# Patient Record
Sex: Male | Born: 1960
Health system: Southern US, Community
[De-identification: ages and names within clinical notes are randomized; demographics above are authoritative.]

## PROBLEM LIST (undated history)

## (undated) DIAGNOSIS — F32A Depression, unspecified: Secondary | ICD-10-CM

## (undated) DIAGNOSIS — F0390 Unspecified dementia without behavioral disturbance: Secondary | ICD-10-CM

## (undated) DIAGNOSIS — M1990 Unspecified osteoarthritis, unspecified site: Secondary | ICD-10-CM

## (undated) HISTORY — PX: ANKLE FRACTURE SURGERY: SHX122

## (undated) HISTORY — PX: APPENDECTOMY: SHX54

## (undated) HISTORY — DX: Unspecified osteoarthritis, unspecified site: M19.90

---
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
## 2007-10-13 ENCOUNTER — Emergency Department (HOSPITAL_COMMUNITY): Admission: EM | Admit: 2007-10-13 | Discharge: 2007-10-13 | Payer: Self-pay | Admitting: Emergency Medicine

## 2007-10-29 ENCOUNTER — Ambulatory Visit (HOSPITAL_BASED_OUTPATIENT_CLINIC_OR_DEPARTMENT_OTHER): Admission: RE | Admit: 2007-10-29 | Discharge: 2007-10-29 | Payer: Self-pay | Admitting: Orthopedic Surgery

## 2007-11-18 ENCOUNTER — Encounter: Admission: RE | Admit: 2007-11-18 | Discharge: 2008-02-18 | Payer: Self-pay | Admitting: Orthopedic Surgery

## 2011-01-03 NOTE — Op Note (Signed)
NAME:  Daniel Griffin, Daniel Griffin              ACCOUNT NO.:  [id number]   MEDICAL RECORD NO.:  [id number]          PATIENT TYPE:  AMB   LOCATION:  DSC                          FACILITY:  [facility]   PHYSICIAN:  Leonides Grills, M.D.     DATE OF BIRTH:  03-Feb-1961   DATE OF PROCEDURE:  10/29/2007  DATE OF DISCHARGE:                               OPERATIVE REPORT   PREOPERATIVE DIAGNOSES:  1. Left distal tib-fib syndesmotic rupture.  2. Left proximal fibular fracture.   POSTOPERATIVE DIAGNOSES:  1. Left distal tib-fib syndesmotic rupture.  2. Left proximal fibular fracture.   OPERATION:  1. Open reduction internal fixation left distal tib-fib syndesmotic      rupture.  2. Stress x-rays, left ankle.  3. Closed reduction with manipulation, left fibular shaft fracture.   ANESTHESIA:  General with popliteal block.   SURGEON:  Leonides Grills, M.D.   ASSISTANT:  Sullivan Lone Clark,P.A.-C.   ESTIMATED BLOOD LOSS:  Minimal.   TOURNIQUET TIME:  Approximately half hour.   COMPLICATIONS:  None.   DISPOSITION:  Stable to PR.   INDICATIONS:  A 50 year old male who sustained the above injury.  He  consented to the above procedure.  All risks which include infection,  nerve or vessel injury, nonunion, malunion, hardware irritation,  hardware failure, the possibility the ligament may not completely heel,  prolonged recovery, stiffness, arthritis were all explained.  Questions  were encouraged and answered.   OPERATION:  The patient was brought to the operating, placed in supine  position.  After adequate general endotracheal tube anesthesia was  administered with popliteal block as well as Ancef 1 gram IV piggyback,  a bump was then placed under the left ipsilateral hip internally  rotating left lower extremity, and left lower extremity was then prepped  and draped in a sterile manner over a proximally placed thigh  tourniquet.  Limb was gravity exsanguinated.  Tourniquet was elevated to  290 mmHg.  A  longitudinal incision that was designated by C-arm  assistance was then made over the lateral malleolus over the area where  the syndesmotic fixation with would be placed.  Dissection was carried  down directly to bone.  Soft tissue was elevated over the lateral  malleolus area approximately a centimeter and a half proximal to the  tibial plafond.  A two-hole one-third tubular plate was then applied.  A  3.2-mm hole was then placed.  We then made a nick and spread technique  incision over the medial malleolus.  Dissection was carried down to  bone.  A small drill hole was placed using a 3.2-mm drill hole.  We then  placed a Brooke Dare Tongs reduction clamp, reduced the distal tib-fib  syndesmosis until it was anatomically reduced, and this was verified  under C-arm guidance in the AP and lateral planes.  This was done with  the ankle in full dorsiflexion.  Once this was done, we then placed a  4.5-mm fully threaded cortical set screw using a 3.2-mm drill hole,  respectively.  This was a four cortices screw, and this had excellent  purchase and maintenance of the  reduction.  The King Tongs clamp was  then removed, and a parallel four cortices to 4.5-mm fully threaded  cortical set screw was placed using a 3.2-mm drill hole, respectively.  This had excellent purchase and maintenance of the initial reduction.  Final stress x-rays were obtained in the AP, lateral, and mortise views  and showed no gross motion, fixation proposition, excellent alignment as  well.  Medial clear space was reduced, and adequate tib-fib overlap was  attained as well as fibular length as well in rotation, and the AP  position of the lateral malleolus was also reproduced as well.  Tourniquet was deflated.  Hemostasis was obtained.  The fibular shaft  was reduced via the reduction maneuver that was done earlier, and this  was also verified under C-arm guidance as well.  The wound was copiously  irrigated with normal saline.   Subcutaneous was closed with 3-0 Vicryl.  Skin was closed 4-0 nylon over all wounds.  Sterile dressing was  applied.  Modified Jones dressing was applied with the ankle in neutral  dorsiflexion.  The patient was stable to PR.      Leonides Grills, M.D.  Electronically Signed     PB/MEDQ  D:  10/29/2007  T:  10/30/2007  Job:  [id number]

## 2011-05-15 LAB — POCT HEMOGLOBIN-HEMACUE: Hemoglobin: 14.5

## 2016-07-07 ENCOUNTER — Encounter: Payer: Self-pay | Admitting: Rheumatology

## 2016-07-07 ENCOUNTER — Ambulatory Visit: Payer: Self-pay | Admitting: Rheumatology

## 2016-07-07 ENCOUNTER — Ambulatory Visit (INDEPENDENT_AMBULATORY_CARE_PROVIDER_SITE_OTHER): Payer: BLUE CROSS/BLUE SHIELD | Admitting: Rheumatology

## 2016-07-07 VITALS — BP 166/88 | HR 83 | Resp 14 | Ht 66.0 in | Wt 216.0 lb

## 2016-07-07 DIAGNOSIS — Z79899 Other long term (current) drug therapy: Secondary | ICD-10-CM | POA: Diagnosis not present

## 2016-07-07 DIAGNOSIS — M79642 Pain in left hand: Secondary | ICD-10-CM | POA: Diagnosis not present

## 2016-07-07 DIAGNOSIS — M79671 Pain in right foot: Secondary | ICD-10-CM

## 2016-07-07 DIAGNOSIS — M0579 Rheumatoid arthritis with rheumatoid factor of multiple sites without organ or systems involvement: Secondary | ICD-10-CM | POA: Diagnosis not present

## 2016-07-07 DIAGNOSIS — M79672 Pain in left foot: Secondary | ICD-10-CM | POA: Diagnosis not present

## 2016-07-07 DIAGNOSIS — M79641 Pain in right hand: Secondary | ICD-10-CM

## 2016-07-07 DIAGNOSIS — M25571 Pain in right ankle and joints of right foot: Secondary | ICD-10-CM

## 2016-07-07 DIAGNOSIS — M25572 Pain in left ankle and joints of left foot: Secondary | ICD-10-CM

## 2016-07-07 LAB — CBC WITH DIFFERENTIAL/PLATELET
Basophils Absolute: 51 cells/uL (ref 0–200)
Basophils Relative: 1 %
Eosinophils Absolute: 204 cells/uL (ref 15–500)
Eosinophils Relative: 4 %
HCT: 41.8 % (ref 38.5–50.0)
Hemoglobin: 14.1 g/dL (ref 13.2–17.1)
Lymphocytes Relative: 25 %
Lymphs Abs: 1275 cells/uL (ref 850–3900)
MCH: 33.3 pg — ABNORMAL HIGH (ref 27.0–33.0)
MCHC: 33.7 g/dL (ref 32.0–36.0)
MCV: 98.8 fL (ref 80.0–100.0)
MPV: 9.3 fL (ref 7.5–12.5)
Monocytes Absolute: 663 cells/uL (ref 200–950)
Monocytes Relative: 13 %
Neutro Abs: 2907 cells/uL (ref 1500–7800)
Neutrophils Relative %: 57 %
Platelets: 211 10*3/uL (ref 140–400)
RBC: 4.23 MIL/uL (ref 4.20–5.80)
RDW: 12.8 % (ref 11.0–15.0)
WBC: 5.1 10*3/uL (ref 3.8–10.8)

## 2016-07-07 LAB — COMPREHENSIVE METABOLIC PANEL
ALT: 30 U/L (ref 9–46)
AST: 22 U/L (ref 10–35)
Albumin: 4.2 g/dL (ref 3.6–5.1)
Alkaline Phosphatase: 61 U/L (ref 40–115)
BUN: 12 mg/dL (ref 7–25)
CO2: 23 mmol/L (ref 20–31)
Calcium: 9.8 mg/dL (ref 8.6–10.3)
Chloride: 106 mmol/L (ref 98–110)
Creat: 0.7 mg/dL (ref 0.70–1.33)
Glucose, Bld: 124 mg/dL — ABNORMAL HIGH (ref 65–99)
Potassium: 4.4 mmol/L (ref 3.5–5.3)
Sodium: 140 mmol/L (ref 135–146)
Total Bilirubin: 0.8 mg/dL (ref 0.2–1.2)
Total Protein: 7.3 g/dL (ref 6.1–8.1)

## 2016-07-07 MED ORDER — ETANERCEPT 50 MG/ML ~~LOC~~ SOAJ: 50.0000 mg | SUBCUTANEOUS | 0 refills | Status: DC

## 2016-07-07 MED ORDER — ENBREL SURECLICK 50 MG/ML ~~LOC~~ SOAJ: 50.0000 mg | SUBCUTANEOUS | 0 refills | Status: DC

## 2016-07-07 NOTE — Progress Notes (Addendum)
Office Visit Note  Patient: Daniel Griffin             Date of Birth: [birth year]           MRN: [id number]             PCP: No primary care provider on file. Referring: No ref. provider found Visit Date: 07/07/2016 Occupation: @GUAROCC @    Subjective:  No chief complaint on file. Follow-up on rheumatoid arthritis, high risk prescription  History of Present Illness: Daniel Griffin is a 55 y.o. male   Last seen 12/24/2015. Patient is doing well with the rheumatoid arthritis with Enbrel on board. He takes his Enbrel every week as prescribed. Patient notes that he is having better range of motion of his hands/wrists even his right elbow which had contracture in the past. He is pleased with the response that he is getting from Enbrel.  He reports that when he does extra activity for work he does have increased joint pain.  He has a history of OA of the hands which causes him occasional pain. Especially when he overdoes it at work.  Activities of Daily Living:  Patient reports morning stiffness for 15 minutes.   Patient Reports nocturnal pain.  Difficulty dressing/grooming: Reports Difficulty climbing stairs: Reports Difficulty getting out of chair: Reports Difficulty using hands for taps, buttons, cutlery, and/or writing: Reports   Review of Systems  Constitutional: Negative for fatigue.  HENT: Negative for mouth sores and mouth dryness.   Eyes: Negative for dryness.  Respiratory: Negative for shortness of breath.   Gastrointestinal: Negative for constipation and diarrhea.  Musculoskeletal: Negative for myalgias and myalgias.  Skin: Negative for sensitivity to sunlight.  Neurological: Negative for memory loss.  Psychiatric/Behavioral: Negative for sleep disturbance.    PMFS History:  There are no active problems to display for this patient.   Past Medical History:  Diagnosis Date  . Arthritis     Family History  Problem Relation Age of Onset  . Alzheimer's  disease Father    History reviewed. No pertinent surgical history. Social History   Social History Narrative  . No narrative on file     Objective: Vital Signs: BP (!) 166/88 (BP Location: Left Arm, Patient Position: Sitting, Cuff Size: Large)   Pulse 83   Resp 14   Ht 5\' 6"  (1.676 m)   Wt 216 lb (98 kg)   BMI 34.86 kg/m    Physical Exam  Constitutional: He is oriented to person, place, and time. He appears well-developed and well-nourished.  HENT:  Head: Normocephalic and atraumatic.  Eyes: Conjunctivae and EOM are normal. Pupils are equal, round, and reactive to light.  Neck: Normal range of motion. Neck supple.  Cardiovascular: Normal rate, regular rhythm and normal heart sounds.  Exam reveals no gallop and no friction rub.   No murmur heard. Pulmonary/Chest: Effort normal and breath sounds normal. No respiratory distress. He has no wheezes. He has no rales. He exhibits no tenderness.  Abdominal: Soft. He exhibits no distension and no mass. There is no tenderness. There is no guarding.  Musculoskeletal: Normal range of motion. He exhibits deformity (RIGHT ELBOW W/ HX OF CONTRACTURE; IMPROVING AFTER STARTING ENBREL).  Lymphadenopathy:    He has no cervical adenopathy.  Neurological: He is alert and oriented to person, place, and time. He exhibits normal muscle tone. Coordination normal.  Skin: Skin is warm and dry. Capillary refill takes less than 2 seconds. No rash noted.  Psychiatric: He  has a normal mood and affect. His behavior is normal. Judgment and thought content normal.  Vitals reviewed.    Musculoskeletal Exam:  Full range of motion of all joints, patient's right elbow has less contracture than before. Patient is doing really well with that.  Grip strength is equal and strong bilaterally  Fiber myalgia tender points are all absent  CDAI Exam: CDAI Homunculus Exam:   Swelling:  RUE: wrist LUE: wrist Right hand: 2nd MCP and 3rd MCP Left hand: 2nd MCP and  3rd MCP  Joint Counts:  CDAI Tender Joint count: 0 CDAI Swollen Joint count: 6  Global Assessments:  Patient Global Assessment: 4 Provider Global Assessment: 4  CDAI Calculated Score: 14  There was no synovitis on exam but there was synovial thickening of bilateral wrists and bilateral second and third MCP joint.   Investigation: No additional findings.   Imaging: No results found.  Speciality Comments: No specialty comments available.    Procedures:  No procedures performed Allergies: Patient has no allergy information on record.   Assessment / Plan:     Visit Diagnoses: Rheumatoid arthritis with rheumatoid factor of multiple sites without organ or systems involvement ([place]) - +RF; +CCP;   High risk medications (not anticoagulants) long-term use - +ENBREL q WEEK (ADEQUATE RESPONSE) - Plan: Comprehensive metabolic panel, CBC with Differential/Platelet, Comprehensive metabolic panel, CBC with Differential/Platelet, Comprehensive metabolic panel, CBC with Differential/Platelet, Quantiferon tb gold assay (blood), Quantiferon tb gold assay (blood)  Pain in both hands  Pain in joints of both feet   On examination today patient has second and third left and right joints MCP with synovial thickening. No tenderness no synovitis.  Patient is doing well with Enbrel which he takes every week.  Give him a sample of Enbrel today.  He is out of the medication and we will give him a refill on Enbrel. He takes it every week as prescribed.  He has lab work done 03/27/2016 and CMP with GFR is normal and CBC with differential is normal except for red blood cells slightly low at 4.07 which is close to normal limits and MCH slightly high at 33.9.  Patient's blood pressure slightly elevated. He was asked to follow with his PCP regarding this.  I ordered TB goal today but accidentally entered it twice. He is only getting TB gold one time today.   Today, we will do CBC with differential  CMP with GFR and then every 3 months. Refill Enbrel 3 months and then one sample to go  Orders: Orders Placed This Encounter  Procedures  . Comprehensive metabolic panel  . CBC with Differential/Platelet  . Quantiferon tb gold assay (blood)  . Quantiferon tb gold assay (blood)   Meds ordered this encounter  Medications  . ENBREL SURECLICK 50 MG/ML injection    Sig: Inject 0.98 mLs (50 mg total) into the skin once a week.    Dispense:  11.76 mL    Refill:  0    Order Specific Question:   Supervising Provider    Answer:   Bo Merino [[id number]]  . etanercept (ENBREL SURECLICK) 50 MG/ML injection    Sig: Inject 0.98 mLs (50 mg total) into the skin once a week.    Dispense:  0.98 mL    Refill:  0    Order Specific Question:   Supervising Provider    Answer:   Bo Merino [phone]    Order Specific Question:   Lot Number?    Answer:  [id number]    Order Specific Question:   Expiration Date?    Answer:   07/21/2018    Order Specific Question:   Quantity    Answer:   1    Face-to-face time spent with patient was 30 minutes. 50% of time was spent in counseling and coordination of care.  Follow-Up Instructions: Return in about 5 months (around 12/05/2016) for RA, HRRX, bilateral hand and feet pain c/w OA.   Eliezer Lofts, PA-C   I examined and evaluated the patient with Eliezer Lofts PA. The plan of care was discussed as noted above.  Bo Merino MD

## 2016-07-10 LAB — QUANTIFERON TB GOLD ASSAY (BLOOD)
Interferon Gamma Release Assay: NEGATIVE
Mitogen-Nil: 6.68 IU/mL
Quantiferon Nil Value: 0.13 IU/mL
Quantiferon Tb Ag Minus Nil Value: 0 IU/mL

## 2016-07-10 NOTE — Progress Notes (Signed)
#  1 (TB gold is negative number#2 (CBC with differential and CMP with GFR within normal limits.No change in treatmentGive lab results to patient and  send copy to his PCP  thank you

## 2016-07-11 ENCOUNTER — Telehealth: Payer: Self-pay | Admitting: Radiology

## 2016-07-11 NOTE — Telephone Encounter (Signed)
-----   Message from [place], [place] sent at 07/10/2016  6:06 PM EST ----- #1 (TB gold is negative number#2 (CBC with differential and CMP with GFR within normal limits.No change in treatmentGive lab results to patient and  send copy to his PCP  thank you

## 2016-07-11 NOTE — Telephone Encounter (Signed)
I have called patient to advise labs are normal  

## 2016-07-24 ENCOUNTER — Other Ambulatory Visit: Payer: Self-pay | Admitting: Rheumatology

## 2016-07-24 NOTE — Telephone Encounter (Signed)
Requesting refill of enbrel. Patient uses [facility] in [place]

## 2016-07-25 ENCOUNTER — Telehealth: Payer: Self-pay | Admitting: *Deleted

## 2016-07-25 ENCOUNTER — Telehealth: Payer: Self-pay | Admitting: Rheumatology

## 2016-07-25 MED ORDER — ENBREL SURECLICK 50 MG/ML ~~LOC~~ SOAJ: 50.0000 mg | SUBCUTANEOUS | 0 refills | Status: DC

## 2016-07-25 NOTE — Telephone Encounter (Signed)
Patient has contacted the office stating that the pharmacy is saying the have not received his prescription for his Enbrel . He hasn't has it in 2 weeks. Can we give him a sample and resubmit  His prescription?   Last Visit: 07/07/16 Next Visit: 12/06/16 Labs: 07/07/16 WNL  TB Gold: 07/07/16 Neg

## 2016-07-25 NOTE — Telephone Encounter (Signed)
Patient's sister notified that we have another Enbrel card that he may pickup when he pick ups the sample.

## 2016-07-25 NOTE — Telephone Encounter (Signed)
Patient advised that the prescription was sent to the pharmacy on 07-07-16 and received by the pharmacy. Patient will contact the pharmacy to verify and will call back if there are any additional problems.

## 2016-07-25 NOTE — Telephone Encounter (Signed)
Patient's sister called and said [facility] told her that the patient's enbrel card has run out. Please advise.

## 2016-07-25 NOTE — Telephone Encounter (Signed)
Yes, pls give sample of enbrel and resubmit and notify Apolonio Schneiders in case she needs to get involved

## 2016-07-25 NOTE — Telephone Encounter (Signed)
Patient advised we could give him a sample of the Enbrel Patient advised we would resubmit the prescription to the pharmacy.

## 2016-08-29 ENCOUNTER — Telehealth: Payer: Self-pay | Admitting: Pharmacist

## 2016-08-29 NOTE — Telephone Encounter (Signed)
Noted we had Enbrel sample set aside on 07/26/16 that patient did not pick up.  Called patient to discuss.  He reports [facility] was able to fill his prescription and he no longer needs to sample.  Patient denies any questions regarding his medication.     Elisabeth Most, Pharm.D., BCPS Clinical Pharmacist Pager: [phone] Phone: [phone] 08/29/2016 4:25 PM

## 2016-10-03 ENCOUNTER — Other Ambulatory Visit: Payer: Self-pay | Admitting: Rheumatology

## 2016-10-03 LAB — CBC WITH DIFFERENTIAL/PLATELET
Basophils Absolute: 0 cells/uL (ref 0–200)
Basophils Relative: 0 %
Eosinophils Absolute: 54 cells/uL (ref 15–500)
Eosinophils Relative: 1 %
HCT: 39.7 % (ref 38.5–50.0)
Hemoglobin: 13.7 g/dL (ref 13.2–17.1)
Lymphocytes Relative: 23 %
Lymphs Abs: 1242 cells/uL (ref 850–3900)
MCH: 32.9 pg (ref 27.0–33.0)
MCHC: 34.5 g/dL (ref 32.0–36.0)
MCV: 95.4 fL (ref 80.0–100.0)
MPV: 9.3 fL (ref 7.5–12.5)
Monocytes Absolute: 810 cells/uL (ref 200–950)
Monocytes Relative: 15 %
Neutro Abs: 3294 cells/uL (ref 1500–7800)
Neutrophils Relative %: 61 %
Platelets: 202 10*3/uL (ref 140–400)
RBC: 4.16 MIL/uL — ABNORMAL LOW (ref 4.20–5.80)
RDW: 12.8 % (ref 11.0–15.0)
WBC: 5.4 10*3/uL (ref 3.8–10.8)

## 2016-10-03 LAB — COMPREHENSIVE METABOLIC PANEL
ALT: 26 U/L (ref 9–46)
AST: 25 U/L (ref 10–35)
Albumin: 4.2 g/dL (ref 3.6–5.1)
Alkaline Phosphatase: 54 U/L (ref 40–115)
BUN: 12 mg/dL (ref 7–25)
CO2: 25 mmol/L (ref 20–31)
Calcium: 9.2 mg/dL (ref 8.6–10.3)
Chloride: 101 mmol/L (ref 98–110)
Creat: 0.95 mg/dL (ref 0.70–1.33)
Glucose, Bld: 107 mg/dL — ABNORMAL HIGH (ref 65–99)
Potassium: 4.3 mmol/L (ref 3.5–5.3)
Sodium: 138 mmol/L (ref 135–146)
Total Bilirubin: 0.8 mg/dL (ref 0.2–1.2)
Total Protein: 7.4 g/dL (ref 6.1–8.1)

## 2016-10-19 ENCOUNTER — Other Ambulatory Visit: Payer: Self-pay | Admitting: *Deleted

## 2016-10-19 NOTE — Telephone Encounter (Signed)
Refill request received via fax  Last Visit: 07/07/16 Next Visit: 12/06/16 Labs: 10/03/16 WNL  TB Gold: 07/07/16 Neg  Okay to refill Enbrel?

## 2016-10-20 MED ORDER — ENBREL SURECLICK 50 MG/ML ~~LOC~~ SOAJ: 50.0000 mg | SUBCUTANEOUS | 0 refills | Status: DC

## 2016-11-30 DIAGNOSIS — Z79899 Other long term (current) drug therapy: Secondary | ICD-10-CM | POA: Insufficient documentation

## 2016-11-30 DIAGNOSIS — M25572 Pain in left ankle and joints of left foot: Secondary | ICD-10-CM

## 2016-11-30 DIAGNOSIS — M79642 Pain in left hand: Secondary | ICD-10-CM

## 2016-11-30 DIAGNOSIS — M25571 Pain in right ankle and joints of right foot: Secondary | ICD-10-CM | POA: Insufficient documentation

## 2016-11-30 DIAGNOSIS — M79641 Pain in right hand: Secondary | ICD-10-CM | POA: Insufficient documentation

## 2016-11-30 DIAGNOSIS — M0579 Rheumatoid arthritis with rheumatoid factor of multiple sites without organ or systems involvement: Secondary | ICD-10-CM | POA: Insufficient documentation

## 2016-11-30 NOTE — Progress Notes (Signed)
Office Visit Note  Patient: Daniel Griffin             Date of Birth: 1960-09-27           MRN: [id number]             PCP: No PCP Per Patient Referring: No ref. provider found Visit Date: 12/06/2016 Occupation: '@GUAROCC'$ @    Subjective:  Follow-up   History of Present Illness: Daniel Griffin is a 56 y.o. male     Activities of Daily Living:  Patient reports morning stiffness for 15 minutes.   Patient Denies nocturnal pain.  Difficulty dressing/grooming: Denies Difficulty climbing stairs: Denies Difficulty getting out of chair: Denies Difficulty using hands for taps, buttons, cutlery, and/or writing: Denies   Review of Systems  Constitutional: Negative for fatigue.  HENT: Negative for mouth sores and mouth dryness.   Eyes: Negative for dryness.  Respiratory: Negative for shortness of breath.   Gastrointestinal: Negative for constipation and diarrhea.  Musculoskeletal: Negative for myalgias and myalgias.  Skin: Negative for sensitivity to sunlight.  Neurological: Negative for memory loss.  Psychiatric/Behavioral: Negative for sleep disturbance.    PMFS History:  Patient Active Problem List   Diagnosis Date Noted  . Rheumatoid arthritis involving multiple sites with positive rheumatoid factor ([place]) 11/30/2016  . High risk medications (not anticoagulants) long-term use 11/30/2016  . Pain in both hands 11/30/2016  . Pain in joints of both feet 11/30/2016    Past Medical History:  Diagnosis Date  . Arthritis     Family History  Problem Relation Age of Onset  . Alzheimer's disease Father    History reviewed. No pertinent surgical history. Social History   Social History Narrative  . No narrative on file     Objective: Vital Signs: BP (!) 150/92   Pulse 88   Resp 16   Ht '5\' 7"'$  (1.702 m)   Wt 225 lb (102.1 kg)   BMI 35.24 kg/m    Physical Exam  Constitutional: He is oriented to person, place, and time. He appears well-developed and  well-nourished.  HENT:  Head: Normocephalic and atraumatic.  Eyes: Conjunctivae and EOM are normal. Pupils are equal, round, and reactive to light.  Neck: Normal range of motion. Neck supple.  Cardiovascular: Normal rate, regular rhythm and normal heart sounds.  Exam reveals no gallop and no friction rub.   No murmur heard. Pulmonary/Chest: Effort normal and breath sounds normal. No respiratory distress. He has no wheezes. He has no rales. He exhibits no tenderness.  Abdominal: Soft. He exhibits no distension and no mass. There is no tenderness. There is no guarding.  Musculoskeletal: Normal range of motion.  Lymphadenopathy:    He has no cervical adenopathy.  Neurological: He is alert and oriented to person, place, and time. He exhibits normal muscle tone. Coordination normal.  Skin: Skin is warm and dry. Capillary refill takes less than 2 seconds. No rash noted.  Psychiatric: He has a normal mood and affect. His behavior is normal. Judgment and thought content normal.  Vitals reviewed.    Musculoskeletal Exam:  Full range of motion of all joints Grip strength is equal and strong bilaterally Fiber myalgia tender points are all absent  CDAI Exam: CDAI Homunculus Exam:   Joint Counts:  CDAI Tender Joint count: 0 CDAI Swollen Joint count: 0   No synovitis noted on examination of the hand  Investigation: Findings:  He has lab work done 03/27/2016 and CMP with GFR is normal  and CBC with differential is normal except for red blood cells slightly low at 4.07 which is close to normal limits and MCH slightly high at 33.9.  TB Gold Negative-06/2016  Orders Only on 10/03/2016  Component Date Value Ref Range Status  . WBC 10/03/2016 5.4  3.8 - 10.8 K/uL Final  . RBC 10/03/2016 4.16* 4.20 - 5.80 MIL/uL Final  . Hemoglobin 10/03/2016 13.7  13.2 - 17.1 g/dL Final  . HCT 10/03/2016 39.7  38.5 - 50.0 % Final  . MCV 10/03/2016 95.4  80.0 - 100.0 fL Final  . MCH 10/03/2016 32.9  27.0 -  33.0 pg Final  . MCHC 10/03/2016 34.5  32.0 - 36.0 g/dL Final  . RDW 10/03/2016 12.8  11.0 - 15.0 % Final  . Platelets 10/03/2016 202  140 - 400 K/uL Final  . MPV 10/03/2016 9.3  7.5 - 12.5 fL Final  . Neutro Abs 10/03/2016 3294  1,500 - 7,800 cells/uL Final  . Lymphs Abs 10/03/2016 1242  850 - 3,900 cells/uL Final  . Monocytes Absolute 10/03/2016 810  200 - 950 cells/uL Final  . Eosinophils Absolute 10/03/2016 54  15 - 500 cells/uL Final  . Basophils Absolute 10/03/2016 0  0 - 200 cells/uL Final  . Neutrophils Relative % 10/03/2016 61  % Final  . Lymphocytes Relative 10/03/2016 23  % Final  . Monocytes Relative 10/03/2016 15  % Final  . Eosinophils Relative 10/03/2016 1  % Final  . Basophils Relative 10/03/2016 0  % Final  . Smear Review 10/03/2016 Criteria for review not met   Final  . Sodium 10/03/2016 138  135 - 146 mmol/L Final  . Potassium 10/03/2016 4.3  3.5 - 5.3 mmol/L Final  . Chloride 10/03/2016 101  98 - 110 mmol/L Final  . CO2 10/03/2016 25  20 - 31 mmol/L Final  . Glucose, Bld 10/03/2016 107* 65 - 99 mg/dL Final  . BUN 10/03/2016 12  7 - 25 mg/dL Final  . Creat 10/03/2016 0.95  0.70 - 1.33 mg/dL Final   Comment:   For patients > or = 58 years of age: The upper reference limit for Creatinine is approximately 13% higher for people identified as African-American.     . Total Bilirubin 10/03/2016 0.8  0.2 - 1.2 mg/dL Final  . Alkaline Phosphatase 10/03/2016 54  40 - 115 U/L Final  . AST 10/03/2016 25  10 - 35 U/L Final  . ALT 10/03/2016 26  9 - 46 U/L Final  . Total Protein 10/03/2016 7.4  6.1 - 8.1 g/dL Final  . Albumin 10/03/2016 4.2  3.6 - 5.1 g/dL Final  . Calcium 10/03/2016 9.2  8.6 - 10.3 mg/dL Final  Office Visit on 07/07/2016  Component Date Value Ref Range Status  . Sodium 07/07/2016 140  135 - 146 mmol/L Final  . Potassium 07/07/2016 4.4  3.5 - 5.3 mmol/L Final  . Chloride 07/07/2016 106  98 - 110 mmol/L Final  . CO2 07/07/2016 23  20 - 31 mmol/L Final  .  Glucose, Bld 07/07/2016 124* 65 - 99 mg/dL Final  . BUN 07/07/2016 12  7 - 25 mg/dL Final  . Creat 07/07/2016 0.70  0.70 - 1.33 mg/dL Final   Comment:   For patients > or = 57 years of age: The upper reference limit for Creatinine is approximately 13% higher for people identified as African-American.     . Total Bilirubin 07/07/2016 0.8  0.2 - 1.2 mg/dL Final  . Alkaline Phosphatase 07/07/2016  61  40 - 115 U/L Final  . AST 07/07/2016 22  10 - 35 U/L Final  . ALT 07/07/2016 30  9 - 46 U/L Final  . Total Protein 07/07/2016 7.3  6.1 - 8.1 g/dL Final  . Albumin 07/07/2016 4.2  3.6 - 5.1 g/dL Final  . Calcium 07/07/2016 9.8  8.6 - 10.3 mg/dL Final  . WBC 07/07/2016 5.1  3.8 - 10.8 K/uL Final  . RBC 07/07/2016 4.23  4.20 - 5.80 MIL/uL Final  . Hemoglobin 07/07/2016 14.1  13.2 - 17.1 g/dL Final  . HCT 07/07/2016 41.8  38.5 - 50.0 % Final  . MCV 07/07/2016 98.8  80.0 - 100.0 fL Final  . MCH 07/07/2016 33.3* 27.0 - 33.0 pg Final  . MCHC 07/07/2016 33.7  32.0 - 36.0 g/dL Final  . RDW 07/07/2016 12.8  11.0 - 15.0 % Final  . Platelets 07/07/2016 211  140 - 400 K/uL Final  . MPV 07/07/2016 9.3  7.5 - 12.5 fL Final  . Neutro Abs 07/07/2016 2907  1,500 - 7,800 cells/uL Final  . Lymphs Abs 07/07/2016 1275  850 - 3,900 cells/uL Final  . Monocytes Absolute 07/07/2016 663  200 - 950 cells/uL Final  . Eosinophils Absolute 07/07/2016 204  15 - 500 cells/uL Final  . Basophils Absolute 07/07/2016 51  0 - 200 cells/uL Final  . Neutrophils Relative % 07/07/2016 57  % Final  . Lymphocytes Relative 07/07/2016 25  % Final  . Monocytes Relative 07/07/2016 13  % Final  . Eosinophils Relative 07/07/2016 4  % Final  . Basophils Relative 07/07/2016 1  % Final  . Smear Review 07/07/2016 Criteria for review not met   Final  . Interferon Gamma Release Assay 07/07/2016 NEGATIVE  NEGATIVE Final  . Quantiferon Nil Value 07/07/2016 0.13  IU/mL Final  . Mitogen-Nil 07/07/2016 6.68  IU/mL Final  . Quantiferon Tb Ag  Minus Nil Value 07/07/2016 0.00  IU/mL Final   Comment:   The Nil tube value is used to determine if the patient has a preexisting immune response which could cause a false-positive reading on the test. In order for a test to be valid, the Nil tube must have a value of less than or equal to 8.0 IU/mL.   The mitogen control tube is used to assure the patient has a healthy immune status and also serves as a control for correct blood handling and incubation. It is used to detect false-negative readings. The mitogen tube must have a gamma interferon value of greater than or equal to 0.5 IU/mL higher than the value of the Nil tube.   The TB antigen tube is coated with the M. tuberculosis specific antigens. For a test to be considered positive, the TB antigen tube value minus the Nil tube value must be greater than or equal to 0.35 IU/mL.   For additional information, please refer to http://education.[facility].com/faq/QFT (This link is being provided for informational/educational purposes only.)       Imaging: No results found.  Speciality Comments: No specialty comments available.    Procedures:  No procedures performed Allergies: Patient has no known allergies.   Assessment / Plan:     Visit Diagnoses: Rheumatoid arthritis involving multiple sites with positive rheumatoid factor ([facility]) - +RF; +CCP  High risk medications (not anticoagulants) long-term use -  Enbrel -50 MG/ML every week  - Plan: CBC with Differential/Platelet, COMPLETE METABOLIC PANEL WITH GFR  Pain in both hands  Pain in joints of both feet  Plan: #1: Rheumatoid arthritis with positive rheumatoid factor and positive CCP. Doing well. Patient states "I feel so much better what being on Enbrel.  #2: High-risk prescription. Enbrel every week. Compliant. Getting labs as required. Last labs were from break 2018 were normal.  #3: OA of bilateral feet. Some pain. Rates her pain as 8 on a scale of  0-10 when it's flaring. Patient works in [employer]. Plans to get new boots. Suggested getting inserts for feet from shoe market.  #4: OA of bilateral hands. Doing well with the hands. Some synovial thickening of second MCP joint bilaterally but no synovitis.  #5: Return to clinic in 5 months  #6: CBC with differential, CMP with GFR due today and then again in 3 months. Patient will come to our office to get that done..  Orders: Orders Placed This Encounter  Procedures  . CBC with Differential/Platelet  . COMPLETE METABOLIC PANEL WITH GFR   Meds ordered this encounter  Medications  . ENBREL SURECLICK 50 MG/ML injection    Sig: Inject 0.98 mLs (50 mg total) into the skin once a week.    Dispense:  11.76 mL    Refill:  0    Order Specific Question:   Supervising Provider    Answer:   Bo Merino [phone]    Face-to-face time spent with patient was 30 minutes. 50% of time was spent in counseling and coordination of care.  Follow-Up Instructions: Return in about 5 months (around 05/08/2017) for RA, Enbrel, TB due nov 2018, OA hand and feet, feet pain, will get inserts and new shoes.   Deshunda Thackston Carlyon Shadow, PA-C   He continues to have significant pain in his bilateral feet. Proper fitting shoes which are inserts were discussed. His rheumatoid arthritis is responded very well to Enbrel. I examined and evaluated the patient with Eliezer Lofts PA. The plan of care was discussed as noted above.  Bo Merino, MD Note - This record has been created using [employer], [employer].  Chart creation errors have been sought, but may not always  have been located. Such creation errors do not reflect on  the standard of medical care.

## 2016-12-06 ENCOUNTER — Ambulatory Visit (INDEPENDENT_AMBULATORY_CARE_PROVIDER_SITE_OTHER): Payer: BLUE CROSS/BLUE SHIELD | Admitting: Rheumatology

## 2016-12-06 ENCOUNTER — Encounter: Payer: Self-pay | Admitting: Rheumatology

## 2016-12-06 VITALS — BP 150/92 | HR 88 | Resp 16 | Ht 67.0 in | Wt 225.0 lb

## 2016-12-06 DIAGNOSIS — Z79899 Other long term (current) drug therapy: Secondary | ICD-10-CM

## 2016-12-06 DIAGNOSIS — M79672 Pain in left foot: Secondary | ICD-10-CM

## 2016-12-06 DIAGNOSIS — M79641 Pain in right hand: Secondary | ICD-10-CM

## 2016-12-06 DIAGNOSIS — M79642 Pain in left hand: Secondary | ICD-10-CM | POA: Diagnosis not present

## 2016-12-06 DIAGNOSIS — M0579 Rheumatoid arthritis with rheumatoid factor of multiple sites without organ or systems involvement: Secondary | ICD-10-CM | POA: Diagnosis not present

## 2016-12-06 DIAGNOSIS — M79671 Pain in right foot: Secondary | ICD-10-CM | POA: Diagnosis not present

## 2016-12-06 DIAGNOSIS — M25571 Pain in right ankle and joints of right foot: Secondary | ICD-10-CM

## 2016-12-06 DIAGNOSIS — M25572 Pain in left ankle and joints of left foot: Secondary | ICD-10-CM

## 2016-12-06 LAB — COMPLETE METABOLIC PANEL WITH GFR
ALT: 27 U/L (ref 9–46)
AST: 26 U/L (ref 10–35)
Albumin: 4.3 g/dL (ref 3.6–5.1)
Alkaline Phosphatase: 55 U/L (ref 40–115)
BUN: 11 mg/dL (ref 7–25)
CO2: 26 mmol/L (ref 20–31)
Calcium: 9.4 mg/dL (ref 8.6–10.3)
Chloride: 102 mmol/L (ref 98–110)
Creat: 0.89 mg/dL (ref 0.70–1.33)
GFR, Est African American: >89 mL/min (ref >=60)
GFR, Est Non African American: >89 mL/min (ref >=60)
Glucose, Bld: 110 mg/dL — ABNORMAL HIGH (ref 65–99)
Potassium: 4.6 mmol/L (ref 3.5–5.3)
Sodium: 139 mmol/L (ref 135–146)
Total Bilirubin: 0.8 mg/dL (ref 0.2–1.2)
Total Protein: 7.4 g/dL (ref 6.1–8.1)

## 2016-12-06 LAB — CBC WITH DIFFERENTIAL/PLATELET
Basophils Absolute: 54 cells/uL (ref 0–200)
Basophils Relative: 1 %
Eosinophils Absolute: 108 cells/uL (ref 15–500)
Eosinophils Relative: 2 %
HCT: 42.2 % (ref 38.5–50.0)
Hemoglobin: 14.5 g/dL (ref 13.2–17.1)
Lymphocytes Relative: 22 %
Lymphs Abs: 1188 cells/uL (ref 850–3900)
MCH: 33.2 pg — ABNORMAL HIGH (ref 27.0–33.0)
MCHC: 34.4 g/dL (ref 32.0–36.0)
MCV: 96.6 fL (ref 80.0–100.0)
MPV: 9.4 fL (ref 7.5–12.5)
Monocytes Absolute: 594 cells/uL (ref 200–950)
Monocytes Relative: 11 %
Neutro Abs: 3456 cells/uL (ref 1500–7800)
Neutrophils Relative %: 64 %
Platelets: 236 10*3/uL (ref 140–400)
RBC: 4.37 MIL/uL (ref 4.20–5.80)
RDW: 13 % (ref 11.0–15.0)
WBC: 5.4 10*3/uL (ref 3.8–10.8)

## 2016-12-06 MED ORDER — ENBREL SURECLICK 50 MG/ML ~~LOC~~ SOAJ: 50.0000 mg | SUBCUTANEOUS | 0 refills | Status: DC

## 2016-12-08 ENCOUNTER — Telehealth: Payer: Self-pay | Admitting: Radiology

## 2016-12-08 NOTE — Telephone Encounter (Signed)
-----   Message from Eliezer Lofts, [place] sent at 12/07/2016  5:11 PM EDT -----  Please send copy of labs to PCP Hent tell patient #1: CMP with GFR is within normal limits except for nonfasting glucose is 110 (acceptable).  #2: CBC with differential is normal

## 2016-12-08 NOTE — Telephone Encounter (Signed)
I have called patient to advise of lab results

## 2016-12-08 NOTE — Telephone Encounter (Signed)
I have called patient to advise labs are normal he does not have a PCP

## 2016-12-13 ENCOUNTER — Telehealth: Payer: Self-pay

## 2016-12-13 NOTE — Telephone Encounter (Signed)
Submitted a prior authorization for patient's Enbrel through Cover My Meds. Will update patient when we receive a response.  Samaiya Awadallah, [place], CPhT 2:56 PM

## 2016-12-14 NOTE — Telephone Encounter (Signed)
Received a "Dup to approved" on prior authorization response through cover my meds.   Spoke with Ubaldo Glassing, a pharmacy benefit representative who states that the authorization has been approved from a previous request in 2015. It has been approved from 05/21/14-08/20/38. The Rx must be filled at a specialty pharmacy. She showed a paid claim from [employer] was submitted on April 21st.   Reference number: [id number]  Demetrios Loll, CPhT 8:18 AM

## 2017-03-12 ENCOUNTER — Other Ambulatory Visit: Payer: Self-pay | Admitting: Rheumatology

## 2017-03-12 NOTE — Telephone Encounter (Signed)
Last Visit: 12/06/16 Next Visit: 05/08/17 Labs: 12/06/16 WNL TB Gold: 07/07/16 Neg  Okay to refill 30 day supply Enbrel?

## 2017-03-12 NOTE — Telephone Encounter (Signed)
ok 

## 2017-03-27 ENCOUNTER — Other Ambulatory Visit: Payer: Self-pay

## 2017-03-27 DIAGNOSIS — Z79899 Other long term (current) drug therapy: Secondary | ICD-10-CM

## 2017-03-27 LAB — COMPREHENSIVE METABOLIC PANEL
ALT: 29 U/L (ref 9–46)
AST: 27 U/L (ref 10–35)
Albumin: 4.5 g/dL (ref 3.6–5.1)
Alkaline Phosphatase: 57 U/L (ref 40–115)
BUN: 9 mg/dL (ref 7–25)
CO2: 23 mmol/L (ref 20–32)
Calcium: 9.4 mg/dL (ref 8.6–10.3)
Chloride: 103 mmol/L (ref 98–110)
Creat: 0.92 mg/dL (ref 0.70–1.33)
Glucose, Bld: 102 mg/dL — ABNORMAL HIGH (ref 65–99)
Potassium: 4.1 mmol/L (ref 3.5–5.3)
Sodium: 139 mmol/L (ref 135–146)
Total Bilirubin: 1.3 mg/dL — ABNORMAL HIGH (ref 0.2–1.2)
Total Protein: 7.6 g/dL (ref 6.1–8.1)

## 2017-03-28 LAB — CBC WITH DIFFERENTIAL/PLATELET
Basophils Absolute: 0 cells/uL (ref 0–200)
Basophils Relative: 0 %
Eosinophils Absolute: 104 cells/uL (ref 15–500)
Eosinophils Relative: 2 %
HCT: 41.9 % (ref 38.5–50.0)
Hemoglobin: 14.4 g/dL (ref 13.2–17.1)
Lymphocytes Relative: 32 %
Lymphs Abs: 1664 cells/uL (ref 850–3900)
MCH: 33.6 pg — ABNORMAL HIGH (ref 27.0–33.0)
MCHC: 34.4 g/dL (ref 32.0–36.0)
MCV: 97.9 fL (ref 80.0–100.0)
MPV: 9.6 fL (ref 7.5–12.5)
Monocytes Absolute: 676 cells/uL (ref 200–950)
Monocytes Relative: 13 %
Neutro Abs: 2756 cells/uL (ref 1500–7800)
Neutrophils Relative %: 53 %
Platelets: 224 10*3/uL (ref 140–400)
RBC: 4.28 MIL/uL (ref 4.20–5.80)
RDW: 13.2 % (ref 11.0–15.0)
WBC: 5.2 10*3/uL (ref 3.8–10.8)

## 2017-04-03 ENCOUNTER — Other Ambulatory Visit: Payer: Self-pay | Admitting: Rheumatology

## 2017-04-03 NOTE — Telephone Encounter (Signed)
Last Visit: 12/06/16 Next Visit: 05/08/17 Labs: 03/27/17 WNL TB Gold: 07/07/16 Neg  Okay to refill per Dr. Estanislado Pandy

## 2017-04-13 ENCOUNTER — Ambulatory Visit (INDEPENDENT_AMBULATORY_CARE_PROVIDER_SITE_OTHER): Payer: BLUE CROSS/BLUE SHIELD | Admitting: Rheumatology

## 2017-04-13 ENCOUNTER — Encounter: Payer: Self-pay | Admitting: Rheumatology

## 2017-04-13 VITALS — BP 148/78 | HR 80 | Resp 16 | Ht 69.0 in | Wt 215.0 lb

## 2017-04-13 DIAGNOSIS — M0579 Rheumatoid arthritis with rheumatoid factor of multiple sites without organ or systems involvement: Secondary | ICD-10-CM

## 2017-04-13 DIAGNOSIS — M79641 Pain in right hand: Secondary | ICD-10-CM | POA: Diagnosis not present

## 2017-04-13 DIAGNOSIS — Z79899 Other long term (current) drug therapy: Secondary | ICD-10-CM | POA: Diagnosis not present

## 2017-04-13 DIAGNOSIS — M79671 Pain in right foot: Secondary | ICD-10-CM | POA: Diagnosis not present

## 2017-04-13 DIAGNOSIS — M25572 Pain in left ankle and joints of left foot: Secondary | ICD-10-CM

## 2017-04-13 DIAGNOSIS — M79642 Pain in left hand: Secondary | ICD-10-CM

## 2017-04-13 DIAGNOSIS — M25571 Pain in right ankle and joints of right foot: Secondary | ICD-10-CM

## 2017-04-13 DIAGNOSIS — M79672 Pain in left foot: Secondary | ICD-10-CM

## 2017-04-13 NOTE — Progress Notes (Signed)
Office Visit Note  Patient: Daniel Griffin             Date of Birth: Apr 26, 1961           MRN: [id number]             PCP: Patient, No Pcp Per Referring: No ref. provider found Visit Date: 04/13/2017 Occupation: '@GUAROCC'$ @    Subjective:  Medication Management   History of Present Illness: Daniel Griffin is a 56 y.o. male  Who states that he is doing really well with his rheumatoid arthritis. He's taking Enbrel every week with good results. No joint pain, swelling, stiffness. He does have history of OA of the hands and wrists. His labs are within normal limits His TB gold is negative as of November 2017 and due again November 2018.   Activities of Daily Living:  Patient reports morning stiffness for 10 minutes.   Patient Denies nocturnal pain.  Difficulty dressing/grooming: Denies Difficulty climbing stairs: Denies Difficulty getting out of chair: Denies Difficulty using hands for taps, buttons, cutlery, and/or writing: Denies   Review of Systems  Constitutional: Negative for fatigue.  HENT: Negative for mouth sores and mouth dryness.   Eyes: Negative for dryness.  Respiratory: Negative for shortness of breath.   Gastrointestinal: Negative for constipation and diarrhea.  Musculoskeletal: Negative for myalgias and myalgias.  Skin: Negative for sensitivity to sunlight.  Neurological: Negative for memory loss.  Psychiatric/Behavioral: Negative for sleep disturbance.    PMFS History:  Patient Active Problem List   Diagnosis Date Noted  . Rheumatoid arthritis involving multiple sites with positive rheumatoid factor ([place]) 11/30/2016  . High risk medications (not anticoagulants) long-term use 11/30/2016  . Pain in both hands 11/30/2016  . Pain in joints of both feet 11/30/2016    Past Medical History:  Diagnosis Date  . Arthritis     Family History  Problem Relation Age of Onset  . Alzheimer's disease Father    History reviewed. No pertinent surgical  history. Social History   Social History Narrative  . No narrative on file     Objective: Vital Signs: BP (!) 148/78   Pulse 80   Resp 16   Ht '5\' 9"'$  (1.753 m)   Wt 215 lb (97.5 kg)   BMI 31.75 kg/m    Physical Exam  Constitutional: He is oriented to person, place, and time. He appears well-developed and well-nourished.  HENT:  Head: Normocephalic and atraumatic.  Eyes: Pupils are equal, round, and reactive to light. Conjunctivae and EOM are normal.  Neck: Normal range of motion. Neck supple.  Cardiovascular: Normal rate, regular rhythm and normal heart sounds.  Exam reveals no gallop and no friction rub.   No murmur heard. Pulmonary/Chest: Effort normal and breath sounds normal. No respiratory distress. He has no wheezes. He has no rales. He exhibits no tenderness.  Abdominal: Soft. He exhibits no distension and no mass. There is no tenderness. There is no guarding.  Musculoskeletal: Normal range of motion.  Lymphadenopathy:    He has no cervical adenopathy.  Neurological: He is alert and oriented to person, place, and time. He exhibits normal muscle tone. Coordination normal.  Skin: Skin is warm and dry. Capillary refill takes less than 2 seconds. No rash noted.  Psychiatric: He has a normal mood and affect. His behavior is normal. Judgment and thought content normal.  Vitals reviewed.    Musculoskeletal Exam:  Full range of motion of all joints Grip strength is equal and  strong bilaterally Fibromyalgia tender points are all absent  CDAI Exam: CDAI Homunculus Exam:   Joint Counts:  CDAI Tender Joint count: 0 CDAI Swollen Joint count: 0  No synovitis on exam today. Patient is doing well.   Investigation: No additional findings. Orders Only on 03/27/2017  Component Date Value Ref Range Status  . Sodium 03/27/2017 139  135 - 146 mmol/L Final  . Potassium 03/27/2017 4.1  3.5 - 5.3 mmol/L Final  . Chloride 03/27/2017 103  98 - 110 mmol/L Final  . CO2 03/27/2017  23  20 - 32 mmol/L Final   Comment: ** Please note change in reference range(s). **     . Glucose, Bld 03/27/2017 102* 65 - 99 mg/dL Final  . BUN 03/27/2017 9  7 - 25 mg/dL Final  . Creat 03/27/2017 0.92  0.70 - 1.33 mg/dL Final   Comment:   For patients > or = 57 years of age: The upper reference limit for Creatinine is approximately 13% higher for people identified as African-American.     . Total Bilirubin 03/27/2017 1.3* 0.2 - 1.2 mg/dL Final  . Alkaline Phosphatase 03/27/2017 57  40 - 115 U/L Final  . AST 03/27/2017 27  10 - 35 U/L Final  . ALT 03/27/2017 29  9 - 46 U/L Final  . Total Protein 03/27/2017 7.6  6.1 - 8.1 g/dL Final  . Albumin 03/27/2017 4.5  3.6 - 5.1 g/dL Final  . Calcium 03/27/2017 9.4  8.6 - 10.3 mg/dL Final  . WBC 03/27/2017 5.2  3.8 - 10.8 K/uL Final  . RBC 03/27/2017 4.28  4.20 - 5.80 MIL/uL Final  . Hemoglobin 03/27/2017 14.4  13.2 - 17.1 g/dL Final  . HCT 03/27/2017 41.9  38.5 - 50.0 % Final  . MCV 03/27/2017 97.9  80.0 - 100.0 fL Final  . MCH 03/27/2017 33.6* 27.0 - 33.0 pg Final  . MCHC 03/27/2017 34.4  32.0 - 36.0 g/dL Final  . RDW 03/27/2017 13.2  11.0 - 15.0 % Final  . Platelets 03/27/2017 224  140 - 400 K/uL Final  . MPV 03/27/2017 9.6  7.5 - 12.5 fL Final  . Neutro Abs 03/27/2017 2756  1,500 - 7,800 cells/uL Final  . Lymphs Abs 03/27/2017 1664  850 - 3,900 cells/uL Final  . Monocytes Absolute 03/27/2017 676  200 - 950 cells/uL Final  . Eosinophils Absolute 03/27/2017 104  15 - 500 cells/uL Final  . Basophils Absolute 03/27/2017 0  0 - 200 cells/uL Final  . Neutrophils Relative % 03/27/2017 53  % Final  . Lymphocytes Relative 03/27/2017 32  % Final  . Monocytes Relative 03/27/2017 13  % Final  . Eosinophils Relative 03/27/2017 2  % Final  . Basophils Relative 03/27/2017 0  % Final  . Smear Review 03/27/2017 Criteria for review not met   Final  Office Visit on 12/06/2016  Component Date Value Ref Range Status  . WBC 12/06/2016 5.4  3.8 - 10.8  K/uL Final  . RBC 12/06/2016 4.37  4.20 - 5.80 MIL/uL Final  . Hemoglobin 12/06/2016 14.5  13.2 - 17.1 g/dL Final  . HCT 12/06/2016 42.2  38.5 - 50.0 % Final  . MCV 12/06/2016 96.6  80.0 - 100.0 fL Final  . MCH 12/06/2016 33.2* 27.0 - 33.0 pg Final  . MCHC 12/06/2016 34.4  32.0 - 36.0 g/dL Final  . RDW 12/06/2016 13.0  11.0 - 15.0 % Final  . Platelets 12/06/2016 236  140 - 400 K/uL Final  .  MPV 12/06/2016 9.4  7.5 - 12.5 fL Final  . Neutro Abs 12/06/2016 3456  1,500 - 7,800 cells/uL Final  . Lymphs Abs 12/06/2016 1188  850 - 3,900 cells/uL Final  . Monocytes Absolute 12/06/2016 594  200 - 950 cells/uL Final  . Eosinophils Absolute 12/06/2016 108  15 - 500 cells/uL Final  . Basophils Absolute 12/06/2016 54  0 - 200 cells/uL Final  . Neutrophils Relative % 12/06/2016 64  % Final  . Lymphocytes Relative 12/06/2016 22  % Final  . Monocytes Relative 12/06/2016 11  % Final  . Eosinophils Relative 12/06/2016 2  % Final  . Basophils Relative 12/06/2016 1  % Final  . Smear Review 12/06/2016 Criteria for review not met   Final  . Sodium 12/06/2016 139  135 - 146 mmol/L Final  . Potassium 12/06/2016 4.6  3.5 - 5.3 mmol/L Final  . Chloride 12/06/2016 102  98 - 110 mmol/L Final  . CO2 12/06/2016 26  20 - 31 mmol/L Final  . Glucose, Bld 12/06/2016 110* 65 - 99 mg/dL Final  . BUN 12/06/2016 11  7 - 25 mg/dL Final  . Creat 12/06/2016 0.89  0.70 - 1.33 mg/dL Final   Comment:   For patients > or = 57 years of age: The upper reference limit for Creatinine is approximately 13% higher for people identified as African-American.     . Total Bilirubin 12/06/2016 0.8  0.2 - 1.2 mg/dL Final  . Alkaline Phosphatase 12/06/2016 55  40 - 115 U/L Final  . AST 12/06/2016 26  10 - 35 U/L Final  . ALT 12/06/2016 27  9 - 46 U/L Final  . Total Protein 12/06/2016 7.4  6.1 - 8.1 g/dL Final  . Albumin 12/06/2016 4.3  3.6 - 5.1 g/dL Final  . Calcium 12/06/2016 9.4  8.6 - 10.3 mg/dL Final  . GFR, Est African  American 12/06/2016 >89  >=60 mL/min Final  . GFR, Est Non African American 12/06/2016 >89  >=60 mL/min Final     Imaging: No results found.  Speciality Comments: No specialty comments available.    Procedures:  No procedures performed Allergies: Patient has no known allergies.   Assessment / Plan:     Visit Diagnoses: Rheumatoid arthritis involving multiple sites with positive rheumatoid factor ([place]) - Plan: Quantiferon tb gold assay (blood)  High risk medications (not anticoagulants) long-term use - Plan: Quantiferon tb gold assay (blood)  Pain in both hands  Pain in joints of both feet   Plan: #1: Rheumatoid arthritis with positive rheumatoid factor. Doing well. No joint pain, swelling, stiffness. Adequate response with Enbrel every week.  #2: High risk prescription. Enbrel once a week. CBC with differential and CMP with GFR done 03/27/2017 are within normal limits.  He'll be due in November 1 week 2018. He'll come to our office to get that done. At that time he'll be due for TB Gold. That has been entered as a standing order in the computer.  #3: OA of bilateral hands. Some DIP PIP prominence.  #4 OA of bilateral feet. Some ongoing occasional discomfort.  #5: Return to clinic in 5 months. Orders: Orders Placed This Encounter  Procedures  . Quantiferon tb gold assay (blood)   No orders of the defined types were placed in this encounter.   Face-to-face time spent with patient was 30 minutes. 50% of time was spent in counseling and coordination of care.  Follow-Up Instructions: Return in about 5 months (around 09/13/2017) for RA,enbrel,.  Eliezer Lofts, PA-C  Note - This record has been created using [employer].  Chart creation errors have been sought, but may not always  have been located. Such creation errors do not reflect on  the standard of medical care.

## 2017-04-28 ENCOUNTER — Other Ambulatory Visit: Payer: Self-pay | Admitting: Rheumatology

## 2017-04-30 NOTE — Telephone Encounter (Signed)
Last Visit: 04/13/17 Next Visit: due January 2019. Labs: 03/27/17 WNL TB Gold: 07/07/16 Neg  Okay to refill per Dr. Estanislado Pandy

## 2017-05-08 ENCOUNTER — Ambulatory Visit: Payer: BLUE CROSS/BLUE SHIELD | Admitting: Rheumatology

## 2017-05-29 ENCOUNTER — Other Ambulatory Visit: Payer: Self-pay | Admitting: Rheumatology

## 2017-05-30 NOTE — Telephone Encounter (Signed)
Last Visit: 04/13/17 Next Visit due January 2019. Message sent to the front to schedule patient.  Labs: 03/27/17 WNL TB Gold: 07/07/16  Okay to refill per Dr. Estanislado Pandy

## 2017-07-03 ENCOUNTER — Other Ambulatory Visit: Payer: Self-pay

## 2017-07-03 ENCOUNTER — Other Ambulatory Visit: Payer: Self-pay | Admitting: *Deleted

## 2017-07-03 DIAGNOSIS — Z79899 Other long term (current) drug therapy: Secondary | ICD-10-CM

## 2017-07-03 DIAGNOSIS — Z9225 Personal history of immunosupression therapy: Secondary | ICD-10-CM

## 2017-07-04 NOTE — Progress Notes (Signed)
Labs are stable. TB gold is pending.

## 2017-07-05 LAB — CBC WITH DIFFERENTIAL/PLATELET
Basophils Absolute: 29 cells/uL (ref 0–200)
Basophils Relative: 0.6 %
Eosinophils Absolute: 93 cells/uL (ref 15–500)
Eosinophils Relative: 1.9 %
HCT: 40.2 % (ref 38.5–50.0)
Hemoglobin: 14.1 g/dL (ref 13.2–17.1)
Lymphs Abs: 1171 cells/uL (ref 850–3900)
MCH: 33.4 pg — ABNORMAL HIGH (ref 27.0–33.0)
MCHC: 35.1 g/dL (ref 32.0–36.0)
MCV: 95.3 fL (ref 80.0–100.0)
MPV: 9.5 fL (ref 7.5–12.5)
Monocytes Relative: 15.1 %
Neutro Abs: 2867 cells/uL (ref 1500–7800)
Neutrophils Relative %: 58.5 %
Platelets: 196 10*3/uL (ref 140–400)
RBC: 4.22 10*6/uL (ref 4.20–5.80)
RDW: 12.3 % (ref 11.0–15.0)
Total Lymphocyte: 23.9 %
WBC mixed population: 740 cells/uL (ref 200–950)
WBC: 4.9 10*3/uL (ref 3.8–10.8)

## 2017-07-05 LAB — COMPLETE METABOLIC PANEL WITH GFR
AG Ratio: 1.4 (calc) (ref 1.0–2.5)
ALT: 31 U/L (ref 9–46)
AST: 30 U/L (ref 10–35)
Albumin: 4.4 g/dL (ref 3.6–5.1)
Alkaline phosphatase (APISO): 59 U/L (ref 40–115)
BUN: 10 mg/dL (ref 7–25)
CO2: 26 mmol/L (ref 20–32)
Calcium: 9.1 mg/dL (ref 8.6–10.3)
Chloride: 102 mmol/L (ref 98–110)
Creat: 0.81 mg/dL (ref 0.70–1.33)
GFR, Est African American: 115 mL/min/{1.73_m2} (ref >=60)
GFR, Est Non African American: 99 mL/min/{1.73_m2} (ref >=60)
Globulin: 3.1 g/dL (calc) (ref 1.9–3.7)
Glucose, Bld: 108 mg/dL — ABNORMAL HIGH (ref 65–99)
Potassium: 4.3 mmol/L (ref 3.5–5.3)
Sodium: 137 mmol/L (ref 135–146)
Total Bilirubin: 1.5 mg/dL — ABNORMAL HIGH (ref 0.2–1.2)
Total Protein: 7.5 g/dL (ref 6.1–8.1)

## 2017-07-05 LAB — QUANTIFERON TB GOLD ASSAY (BLOOD)
Mitogen-Nil: 4.64 IU/mL
QUANTIFERON(R)-TB GOLD: NEGATIVE
Quantiferon Nil Value: 0.46 IU/mL
Quantiferon Tb Ag Minus Nil Value: 0.08 IU/mL

## 2017-09-17 ENCOUNTER — Other Ambulatory Visit: Payer: Self-pay | Admitting: Rheumatology

## 2017-09-18 NOTE — Telephone Encounter (Addendum)
Last Visit: 04/13/17 Next Visit due 09/24/17 Labs: 07/03/17 stable TB Gold: 07/03/17 Neg   Okay to refill per Dr. Estanislado Pandy

## 2017-09-21 NOTE — Progress Notes (Signed)
Office Visit Note  Patient: Daniel Griffin             Date of Birth: 02-29-1961           MRN: [id number]             PCP: Patient, No Pcp Per Referring: No ref. provider found Visit Date: 09/24/2017 Occupation: @GUAROCC @    Subjective:  Joint stiffness    History of Present Illness: Daniel Griffin is a 57 y.o. male with history of seropositive rheumatoid arthritis and osteoarthritis.  Patient states that he injects his dose of Enbrel every Thursday.  He denies any side effects and has been tolerating the medication well.  He feels that Enbrel has been controlling his symptoms well.  He states that he does not experience breakthrough pain and his Enbrel managing his symptoms between doses.  He states that he experiences aches in his joint with cold damp weather.  He has started to feel better with warmer weather.  He denies any joint pain or joint swelling at this time.  He states he experiences some morning stiffness, especially in his knees and feet.  He denies any knee pain or swelling.       Activities of Daily Living:  Patient reports morning stiffness for 20 minutes.   Patient Denies nocturnal pain.  Difficulty dressing/grooming: Denies Difficulty climbing stairs: Reports Difficulty getting out of chair: Denies Difficulty using hands for taps, buttons, cutlery, and/or writing: Denies   Review of Systems  Constitutional: Negative for fatigue, night sweats and weakness.  HENT: Negative for mouth sores, mouth dryness and nose dryness.   Eyes: Negative for redness and dryness.  Respiratory: Negative for cough, hemoptysis, shortness of breath and difficulty breathing.   Cardiovascular: Negative for chest pain, palpitations, hypertension, irregular heartbeat and swelling in legs/feet.  Gastrointestinal: Negative for blood in stool, constipation and diarrhea.  Endocrine: Negative for increased urination.  Genitourinary: Negative for painful urination.  Musculoskeletal:  Positive for arthralgias, joint pain and morning stiffness. Negative for joint swelling, myalgias, muscle weakness, muscle tenderness and myalgias.  Skin: Negative for color change, pallor, rash, hair loss, nodules/bumps, redness, skin tightness, ulcers and sensitivity to sunlight.  Allergic/Immunologic: Negative for susceptible to infections.  Neurological: Negative for dizziness, fainting, memory loss and night sweats.  Hematological: Negative for swollen glands.  Psychiatric/Behavioral: Negative for depressed mood and sleep disturbance. The patient is not nervous/anxious.     PMFS History:  Patient Active Problem List   Diagnosis Date Noted  . Rheumatoid arthritis involving multiple sites with positive rheumatoid factor ([place]) 11/30/2016  . High risk medications (not anticoagulants) long-term use 11/30/2016  . Pain in both hands 11/30/2016  . Pain in joints of both feet 11/30/2016    Past Medical History:  Diagnosis Date  . Arthritis     Family History  Problem Relation Age of Onset  . Alzheimer's disease Father    History reviewed. No pertinent surgical history. Social History   Social History Narrative  . Not on file     Objective: Vital Signs: BP (!) 167/96 (BP Location: Left Arm, Patient Position: Sitting, Cuff Size: Normal)   Pulse 87   Resp 17   Ht 5\' 7"  (1.702 m)   Wt 228 lb (103.4 kg)   BMI 35.71 kg/m    Physical Exam  Constitutional: He is oriented to person, place, and time. He appears well-developed and well-nourished.  HENT:  Head: Normocephalic and atraumatic.  Eyes: Conjunctivae and EOM are  normal. Pupils are equal, round, and reactive to light.  Neck: Normal range of motion. Neck supple.  Cardiovascular: Normal rate, regular rhythm and normal heart sounds.  Pulmonary/Chest: Effort normal and breath sounds normal.  Abdominal: Soft. Bowel sounds are normal.  Neurological: He is alert and oriented to person, place, and time.  Skin: Skin is warm and  dry. Capillary refill takes less than 2 seconds.  Psychiatric: He has a normal mood and affect. His behavior is normal.  Nursing note and vitals reviewed.    Musculoskeletal Exam: C-spine, thoracic, and lumbar spine good ROM.  No midline spinal tenderness.  No SI joint tenderness.  Shoulder joints good ROM with no discomfort.  He has mild elbow contractures bilaterally.  No tenderness or synovitis.  Wrist joints full ROM with no discomfort. He has mild puffiness superior to his right wrist.  No warmth or tenderness on exam. MCP synovial thickening.  No synovitis on exam.  PIP and DIP synovial thickening consistent with osteoarthritis.  Hip joints, knee joints, ankle joints, MTPs, PIPs, and DIPs good ROM with no synovitis.  No trochanteric bursitis.    CDAI Exam: CDAI Homunculus Exam:   Joint Counts:  CDAI Tender Joint count: 0 CDAI Swollen Joint count: 0  Global Assessments:  Patient Global Assessment: 2 Provider Global Assessment: 2  CDAI Calculated Score: 4    Investigation: No additional findings.  TB gold 07/03/17 negative  CBC Latest Ref Rng & Units 07/03/2017 03/27/2017 12/06/2016  WBC 3.8 - 10.8 Thousand/uL 4.9 5.2 5.4  Hemoglobin 13.2 - 17.1 g/dL 14.1 14.4 14.5  Hematocrit 38.5 - 50.0 % 40.2 41.9 42.2  Platelets 140 - 400 Thousand/uL 196 224 236   CMP Latest Ref Rng & Units 07/03/2017 03/27/2017 12/06/2016  Glucose 65 - 99 mg/dL 108(H) 102(H) 110(H)  BUN 7 - 25 mg/dL 10 9 11   Creatinine 0.70 - 1.33 mg/dL 0.81 0.92 0.89  Sodium 135 - 146 mmol/L 137 139 139  Potassium 3.5 - 5.3 mmol/L 4.3 4.1 4.6  Chloride 98 - 110 mmol/L 102 103 102  CO2 20 - 32 mmol/L 26 23 26   Calcium 8.6 - 10.3 mg/dL 9.1 9.4 9.4  Total Protein 6.1 - 8.1 g/dL 7.5 7.6 7.4  Total Bilirubin 0.2 - 1.2 mg/dL 1.5(H) 1.3(H) 0.8  Alkaline Phos 40 - 115 U/L - 57 55  AST 10 - 35 U/L 30 27 26   ALT 9 - 46 U/L 31 29 27     Imaging: No results found.  Speciality Comments: No specialty comments  available.    Procedures:  No procedures performed Allergies: Patient has no known allergies.   Assessment / Plan:     Visit Diagnoses: Rheumatoid arthritis involving multiple sites with positive rheumatoid factor ([place]): He has no synovitis on exam.  He has synovial thickening of MCPs from previous damage.  He has no tenderness of MCPs,  He has complete fist formation on exam.  He has PIP and DIP synovial thickening consistent with osteoarthritis.  He is clinically doing well on Enbrel.  He will continue on Enbrel.  Refill was sent on 09/18/17.   High risk medications (not anticoagulants) long-term use - Enbrel sureclick, TB gold 49/44/96 negative, CBC and CMP were performed today. He will be due for labs in May and every 3 months.  - Plan: CBC with Differential/Platelet, COMPLETE METABOLIC PANEL WITH GFR, COMPLETE METABOLIC PANEL WITH GFR, CBC with Differential/Platelet  Primary osteoarthritis of both hands: He has PIP and DIP synovial thickening on exam  consistent with osteoarthritis.Marland Kitchen  He experiences occasional discomfort in his bilateral hands with weather changes.    Primary osteoarthritis of both feet: He has no discomfort at this time.  He wears proper fitting shoes, which has helped his feet pain.    Orders: Orders Placed This Encounter  Procedures  . CBC with Differential/Platelet  . COMPLETE METABOLIC PANEL WITH GFR  . COMPLETE METABOLIC PANEL WITH GFR  . CBC with Differential/Platelet   No orders of the defined types were placed in this encounter.     Follow-Up Instructions: Return in about 5 months (around 02/21/2018) for Rheumatoid arthritis, Osteoarthritis.   Ofilia Neas, PA-C  Note - This record has been created using Dragon software.  Chart creation errors have been sought, but may not always  have been located. Such creation errors do not reflect on  the standard of medical care.

## 2017-09-24 ENCOUNTER — Encounter: Payer: Self-pay | Admitting: Physician Assistant

## 2017-09-24 ENCOUNTER — Ambulatory Visit (INDEPENDENT_AMBULATORY_CARE_PROVIDER_SITE_OTHER): Payer: BLUE CROSS/BLUE SHIELD | Admitting: Physician Assistant

## 2017-09-24 VITALS — BP 167/96 | HR 87 | Resp 17 | Ht 67.0 in | Wt 228.0 lb

## 2017-09-24 DIAGNOSIS — M19041 Primary osteoarthritis, right hand: Secondary | ICD-10-CM

## 2017-09-24 DIAGNOSIS — M19042 Primary osteoarthritis, left hand: Secondary | ICD-10-CM

## 2017-09-24 DIAGNOSIS — M19071 Primary osteoarthritis, right ankle and foot: Secondary | ICD-10-CM | POA: Diagnosis not present

## 2017-09-24 DIAGNOSIS — M0579 Rheumatoid arthritis with rheumatoid factor of multiple sites without organ or systems involvement: Secondary | ICD-10-CM | POA: Diagnosis not present

## 2017-09-24 DIAGNOSIS — M19072 Primary osteoarthritis, left ankle and foot: Secondary | ICD-10-CM | POA: Diagnosis not present

## 2017-09-24 DIAGNOSIS — Z79899 Other long term (current) drug therapy: Secondary | ICD-10-CM

## 2017-09-25 LAB — COMPLETE METABOLIC PANEL WITH GFR
AG Ratio: 1.4 (calc) (ref 1.0–2.5)
ALT: 29 U/L (ref 9–46)
AST: 23 U/L (ref 10–35)
Albumin: 3.9 g/dL (ref 3.6–5.1)
Alkaline phosphatase (APISO): 51 U/L (ref 40–115)
BUN: 16 mg/dL (ref 7–25)
CO2: 27 mmol/L (ref 20–32)
Calcium: 9 mg/dL (ref 8.6–10.3)
Chloride: 103 mmol/L (ref 98–110)
Creat: 0.91 mg/dL (ref 0.70–1.33)
GFR, Est African American: 108 mL/min/{1.73_m2} (ref >=60)
GFR, Est Non African American: 93 mL/min/{1.73_m2} (ref >=60)
Globulin: 2.8 g/dL (calc) (ref 1.9–3.7)
Glucose, Bld: 115 mg/dL — ABNORMAL HIGH (ref 65–99)
Potassium: 4.7 mmol/L (ref 3.5–5.3)
Sodium: 138 mmol/L (ref 135–146)
Total Bilirubin: 0.6 mg/dL (ref 0.2–1.2)
Total Protein: 6.7 g/dL (ref 6.1–8.1)

## 2017-09-25 LAB — CBC WITH DIFFERENTIAL/PLATELET
Basophils Absolute: 42 cells/uL (ref 0–200)
Basophils Relative: 0.9 %
Eosinophils Absolute: 42 cells/uL (ref 15–500)
Eosinophils Relative: 0.9 %
HCT: 38.9 % (ref 38.5–50.0)
Hemoglobin: 13.4 g/dL (ref 13.2–17.1)
Lymphs Abs: 761 cells/uL — ABNORMAL LOW (ref 850–3900)
MCH: 33 pg (ref 27.0–33.0)
MCHC: 34.4 g/dL (ref 32.0–36.0)
MCV: 95.8 fL (ref 80.0–100.0)
MPV: 9.7 fL (ref 7.5–12.5)
Monocytes Relative: 14.9 %
Neutro Abs: 3154 cells/uL (ref 1500–7800)
Neutrophils Relative %: 67.1 %
Platelets: 181 10*3/uL (ref 140–400)
RBC: 4.06 10*6/uL — ABNORMAL LOW (ref 4.20–5.80)
RDW: 12.3 % (ref 11.0–15.0)
Total Lymphocyte: 16.2 %
WBC mixed population: 700 cells/uL (ref 200–950)
WBC: 4.7 10*3/uL (ref 3.8–10.8)

## 2017-09-25 NOTE — Progress Notes (Signed)
RBC slightly low.  Hgb normal.  We will continue to monitor.  Glucose mildly elevated.  All other labs are WNL.

## 2017-10-25 ENCOUNTER — Telehealth: Payer: Self-pay | Admitting: Rheumatology

## 2017-10-25 NOTE — Telephone Encounter (Signed)
Daniel Griffin from [name] left a voicemail stating that she was following up on a prior authorization faxed to the office.  Please return the call to [phone]

## 2017-11-01 NOTE — Telephone Encounter (Signed)
Received a fax from [facility] regarding a prior authorization approval for ENBREL from 05/21/2014 to 08/20/2038.   Reference number: [id number] Phone number: [phone]  Will send document to scan center.  Saliyah Gillin, [place], CPhT 12:06 PM

## 2017-11-01 NOTE — Telephone Encounter (Addendum)
Returned call to cover my meds. Spoke with Denton Ar who states that there was a PA request faxed to the clinic for pts enbrel and wanted to make sure we received it. We have not received a fax from [facility].   The authorization has been submitted to pts insurance using the "key" ([id number]) given from [place] through [facility]. Will update once we have a response.   Alizae Bechtel, [place], CPhT 11:07 AM

## 2018-02-08 NOTE — Progress Notes (Signed)
Office Visit Note  Patient: Daniel Griffin             Date of Birth: 1961-04-23           MRN: [id number]             PCP: Patient, No Pcp Per Referring: No ref. provider found Visit Date: 02/22/2018 Occupation: @GUAROCC @    Subjective:  Joint stiffness   History of Present Illness: Daniel Griffin is a 57 y.o. male with history of seropositive rheumatoid arthritis and osteoarthritis.  Patient is on Enbrel subcutaneous injections once weekly.  He reports that he injects his Enbrel injections every Tuesday.  He states that he developed some increased joint stiffness and aching Sunday afternoons.  He denies any joint pain or joint swelling at this time.  He has morning stiffness lasts about 30 minutes.  He states he is very physically demanding job which leads to some achiness in his bilateral hands bilateral knees and bilateral ankles.  He will occasionally take Goody powder especially if is a damp rainy day.  He denies any recent flares of rheumatoid arthritis.  He denies missing any doses of Enbrel.  He states he does need a refill of Enbrel today.  He denies any rheumatoid nodules.    Activities of Daily Living:  Patient reports morning stiffness for 30  minutes.   Patient Denies nocturnal pain.  Difficulty dressing/grooming: Denies Difficulty climbing stairs: Denies Difficulty getting out of chair: Denies Difficulty using hands for taps, buttons, cutlery, and/or writing: Denies   Review of Systems  Constitutional: Negative for fatigue and night sweats.  HENT: Positive for mouth dryness. Negative for mouth sores, trouble swallowing, trouble swallowing and nose dryness.   Eyes: Positive for dryness. Negative for redness and visual disturbance.  Respiratory: Negative for cough, hemoptysis, shortness of breath and difficulty breathing.   Cardiovascular: Negative for chest pain, palpitations, hypertension, irregular heartbeat and swelling in legs/feet.  Gastrointestinal:  Negative for blood in stool, constipation and diarrhea.  Endocrine: Negative for increased urination.  Genitourinary: Negative for painful urination.  Musculoskeletal: Positive for morning stiffness. Negative for arthralgias, joint pain, joint swelling, myalgias, muscle weakness, muscle tenderness and myalgias.  Skin: Negative for color change, rash, hair loss, nodules/bumps, skin tightness, ulcers and sensitivity to sunlight.  Allergic/Immunologic: Negative for susceptible to infections.  Neurological: Negative for dizziness, fainting, memory loss, night sweats and weakness.  Hematological: Negative for swollen glands.  Psychiatric/Behavioral: Negative for depressed mood and sleep disturbance. The patient is not nervous/anxious.     PMFS History:  Patient Active Problem List   Diagnosis Date Noted  . Rheumatoid arthritis involving multiple sites with positive rheumatoid factor ([place]) 11/30/2016  . High risk medications (not anticoagulants) long-term use 11/30/2016  . Pain in both hands 11/30/2016  . Pain in joints of both feet 11/30/2016    Past Medical History:  Diagnosis Date  . Arthritis     Family History  Problem Relation Age of Onset  . Alzheimer's disease Father   . Healthy Son   . Healthy Daughter   . Rheum arthritis Daughter   . Healthy Daughter    Past Surgical History:  Procedure Laterality Date  . APPENDECTOMY     Social History   Social History Narrative  . Not on file     Objective: Vital Signs: BP 121/87 (BP Location: Left Arm, Patient Position: Sitting, Cuff Size: Normal)   Pulse 74   Resp 15   Ht 5\' 6"  (1.676  m)   Wt 216 lb (98 kg)   BMI 34.86 kg/m    Physical Exam  Constitutional: He is oriented to person, place, and time. He appears well-developed and well-nourished.  HENT:  Head: Normocephalic and atraumatic.  Eyes: Pupils are equal, round, and reactive to light. Conjunctivae and EOM are normal.  Neck: Normal range of motion. Neck supple.    Cardiovascular: Normal rate, regular rhythm and normal heart sounds.  Pulmonary/Chest: Effort normal and breath sounds normal.  Abdominal: Soft. Bowel sounds are normal.  Lymphadenopathy:    He has no cervical adenopathy.  Neurological: He is alert and oriented to person, place, and time.  Skin: Skin is warm and dry. Capillary refill takes less than 2 seconds.  Psychiatric: He has a normal mood and affect. His behavior is normal.  Nursing note and vitals reviewed.    Musculoskeletal Exam: C-spine slightly limited ROM with lateral rotation.  Thoracic and lumbar spine good ROM.  No midline spinal tenderness.  No SI joint tenderness.  Left shoulder slightly limited abduction with no discomfort.  Right shoulder full range of motion with no discomfort.  Bilateral elbow contractures.  Slightly limited range of motion of bilateral wrist joints with no discomfort.  Synovitis in the right ulnar styloid region.  MCPs, PIPs, DIPs good range of motion with no synovitis.  MCP synovial thickening.  PIP and DIP synovial thickening consistent with osteoarthritis of bilateral hands.  Hip joints, knee joints, ankle joints, MTPs, PIPs, DIPs good range of motion no synovitis.  No warmth or effusion of bilateral knee joints.  No tenderness of trochanteric bursa bilaterally.   CDAI Exam: CDAI Homunculus Exam:   Swelling:  RUE: wrist  Joint Counts:  CDAI Tender Joint count: 0 CDAI Swollen Joint count: 1  Global Assessments:  Patient Global Assessment: 3 Provider Global Assessment: 3  CDAI Calculated Score: 7    Investigation: No additional findings. CBC Latest Ref Rng & Units 09/24/2017 07/03/2017 03/27/2017  WBC 3.8 - 10.8 Thousand/uL 4.7 4.9 5.2  Hemoglobin 13.2 - 17.1 g/dL 13.4 14.1 14.4  Hematocrit 38.5 - 50.0 % 38.9 40.2 41.9  Platelets 140 - 400 Thousand/uL 181 196 224   CMP Latest Ref Rng & Units 09/24/2017 07/03/2017 03/27/2017  Glucose 65 - 99 mg/dL 115(H) 108(H) 102(H)  BUN 7 - 25 mg/dL 16 10  9   Creatinine 0.70 - 1.33 mg/dL 0.91 0.81 0.92  Sodium 135 - 146 mmol/L 138 137 139  Potassium 3.5 - 5.3 mmol/L 4.7 4.3 4.1  Chloride 98 - 110 mmol/L 103 102 103  CO2 20 - 32 mmol/L 27 26 23   Calcium 8.6 - 10.3 mg/dL 9.0 9.1 9.4  Total Protein 6.1 - 8.1 g/dL 6.7 7.5 7.6  Total Bilirubin 0.2 - 1.2 mg/dL 0.6 1.5(H) 1.3(H)  Alkaline Phos 40 - 115 U/L - - 57  AST 10 - 35 U/L 23 30 27   ALT 9 - 46 U/L 29 31 29       Imaging: No results found.  Speciality Comments: No specialty comments available.    Procedures:  No procedures performed Allergies: Patient has no known allergies.   Assessment / Plan:     Visit Diagnoses: Rheumatoid arthritis involving multiple sites with positive rheumatoid factor ([place]): He has synovitis in the right ulnar styloid region.  He has no tenderness on exam.  He has synovial thickening of MCP joints from previous damage but no active synovitis.  He has no tenderness of his MCP joints.  He has complete  fist formation bilaterally.  He reports that yesterday he was golfing which he feels is because some of the inflammation in his right wrist.  He denies wanting to change medications at this time.  He reports that he has not had any recent rheumatoid arthritis flares.  He states he has no joint pain at this time.  He states that he has joint stiffness lasting about 30 minutes first thing in the morning.  He would like to continue on Enbrel subcutaneous injections once weekly.  He was advised to notify us if he develops increased joint pain or joint swelling.  Refill of Enbrel was sent to the pharmacy today.  He was reminded of the importance of having yearly skin exams while being on anti-TNF. He wears sunscreen on a daily basis.   High risk medications (not anticoagulants) long-term use - Enbrel TB gold 07/03/17 negative.  CBC and CMP are drawn today to monitor for drug toxicity.  He will return in October and every 3 months for lab work.- Plan: CBC with  Differential/Platelet, COMPLETE METABOLIC PANEL WITH GFR  Primary osteoarthritis of both hands: He has PIP and DIP synovial thickening consistent with osteoarthritis of bilateral hands.  He occasionally experience discomfort in bilateral hands especially with weather changes.  He takes Goody powder when he is having increased discomfort.  Joint protection and muscle strengthening were discussed.  Primary osteoarthritis of both feet: He has no discomfort in his feet at this time.  He wears proper fitting shoes.   Orders: Orders Placed This Encounter  Procedures  . CBC with Differential/Platelet  . COMPLETE METABOLIC PANEL WITH GFR   Meds ordered this encounter  Medications  . ENBREL SURECLICK 50 MG/ML injection    Sig: INJECT 50MG  (1 PEN) SUBCUTANEOUSLY ONCE A WEEK.    Dispense:  11.76 mL    Refill:  0     Follow-Up Instructions: Return in about 5 months (around 07/25/2018) for Rheumatoid arthritis, Osteoarthritis.   Ofilia Neas, PA-C  Note - This record has been created using Dragon software.  Chart creation errors have been sought, but may not always  have been located. Such creation errors do not reflect on  the standard of medical care.

## 2018-02-22 ENCOUNTER — Encounter: Payer: Self-pay | Admitting: Physician Assistant

## 2018-02-22 ENCOUNTER — Ambulatory Visit (INDEPENDENT_AMBULATORY_CARE_PROVIDER_SITE_OTHER): Payer: BLUE CROSS/BLUE SHIELD | Admitting: Physician Assistant

## 2018-02-22 VITALS — BP 121/87 | HR 74 | Resp 15 | Ht 66.0 in | Wt 216.0 lb

## 2018-02-22 DIAGNOSIS — M19041 Primary osteoarthritis, right hand: Secondary | ICD-10-CM

## 2018-02-22 DIAGNOSIS — M19042 Primary osteoarthritis, left hand: Secondary | ICD-10-CM | POA: Diagnosis not present

## 2018-02-22 DIAGNOSIS — M0579 Rheumatoid arthritis with rheumatoid factor of multiple sites without organ or systems involvement: Secondary | ICD-10-CM | POA: Diagnosis not present

## 2018-02-22 DIAGNOSIS — Z79899 Other long term (current) drug therapy: Secondary | ICD-10-CM | POA: Diagnosis not present

## 2018-02-22 DIAGNOSIS — M19072 Primary osteoarthritis, left ankle and foot: Secondary | ICD-10-CM

## 2018-02-22 DIAGNOSIS — M19071 Primary osteoarthritis, right ankle and foot: Secondary | ICD-10-CM | POA: Diagnosis not present

## 2018-02-22 LAB — CBC WITH DIFFERENTIAL/PLATELET
Basophils Absolute: 31 cells/uL (ref 0–200)
Basophils Relative: 1.2 %
Eosinophils Absolute: 140 cells/uL (ref 15–500)
Eosinophils Relative: 5.4 %
HCT: 37.1 % — ABNORMAL LOW (ref 38.5–50.0)
Hemoglobin: 13.5 g/dL (ref 13.2–17.1)
Lymphs Abs: 1178 cells/uL (ref 850–3900)
MCH: 34.1 pg — ABNORMAL HIGH (ref 27.0–33.0)
MCHC: 36.4 g/dL — ABNORMAL HIGH (ref 32.0–36.0)
MCV: 93.7 fL (ref 80.0–100.0)
MPV: 9.3 fL (ref 7.5–12.5)
Monocytes Relative: 20.2 %
Neutro Abs: 725 cells/uL — ABNORMAL LOW (ref 1500–7800)
Neutrophils Relative %: 27.9 %
Platelets: 199 10*3/uL (ref 140–400)
RBC: 3.96 10*6/uL — ABNORMAL LOW (ref 4.20–5.80)
RDW: 12.9 % (ref 11.0–15.0)
Total Lymphocyte: 45.3 %
WBC mixed population: 525 cells/uL (ref 200–950)
WBC: 2.6 10*3/uL — ABNORMAL LOW (ref 3.8–10.8)

## 2018-02-22 LAB — COMPLETE METABOLIC PANEL WITH GFR
AG Ratio: 1.5 (calc) (ref 1.0–2.5)
ALT: 28 U/L (ref 9–46)
AST: 23 U/L (ref 10–35)
Albumin: 4.1 g/dL (ref 3.6–5.1)
Alkaline phosphatase (APISO): 54 U/L (ref 40–115)
BUN: 7 mg/dL (ref 7–25)
CO2: 24 mmol/L (ref 20–32)
Calcium: 8.5 mg/dL — ABNORMAL LOW (ref 8.6–10.3)
Chloride: 107 mmol/L (ref 98–110)
Creat: 0.77 mg/dL (ref 0.70–1.33)
GFR, Est African American: 117 mL/min/{1.73_m2} (ref >=60)
GFR, Est Non African American: 101 mL/min/{1.73_m2} (ref >=60)
Globulin: 2.7 g/dL (calc) (ref 1.9–3.7)
Glucose, Bld: 96 mg/dL (ref 65–99)
Potassium: 4 mmol/L (ref 3.5–5.3)
Sodium: 139 mmol/L (ref 135–146)
Total Bilirubin: 0.7 mg/dL (ref 0.2–1.2)
Total Protein: 6.8 g/dL (ref 6.1–8.1)

## 2018-02-22 MED ORDER — ENBREL SURECLICK 50 MG/ML ~~LOC~~ SOAJ: SUBCUTANEOUS | 0 refills | Status: DC

## 2018-02-22 NOTE — Patient Instructions (Signed)
Standing Labs We placed an order today for your standing lab work.   Please come back and get your standing labs in October and every 3 months   We have open lab Monday through Friday from 8:30-11:30 AM and 1:30-4:00 PM  at the office of Dr. Shaili Deveshwar.   You may experience shorter wait times on Monday and Friday afternoons. The office is located at [address], [address], [place], [place] [address] No appointment is necessary.   Labs are drawn by [facility].  You may receive a bill from [facility] for your lab work. If you have any questions regarding directions or hours of operation,  please call [phone].    

## 2018-02-25 NOTE — Progress Notes (Signed)
WBC count is 2.6.  He is on Enbrel subcutaneous injections once weekly.  Please ask patient if he has had any recent infections.  Please advise patient to return in 1 week to recheck CBC.   RBC is also low.  Calcium is borderline low.  Please advise patient to increase dietary intake.  He can also take a calcium OTC supplement daily.

## 2018-03-01 ENCOUNTER — Other Ambulatory Visit: Payer: Self-pay | Admitting: *Deleted

## 2018-03-01 DIAGNOSIS — Z79899 Other long term (current) drug therapy: Secondary | ICD-10-CM

## 2018-03-01 LAB — COMPLETE METABOLIC PANEL WITH GFR
AG Ratio: 1.5 (calc) (ref 1.0–2.5)
ALT: 26 U/L (ref 9–46)
AST: 24 U/L (ref 10–35)
Albumin: 4 g/dL (ref 3.6–5.1)
Alkaline phosphatase (APISO): 48 U/L (ref 40–115)
BUN: 8 mg/dL (ref 7–25)
CO2: 28 mmol/L (ref 20–32)
Calcium: 8.8 mg/dL (ref 8.6–10.3)
Chloride: 104 mmol/L (ref 98–110)
Creat: 0.8 mg/dL (ref 0.70–1.33)
GFR, Est African American: 115 mL/min/{1.73_m2} (ref >=60)
GFR, Est Non African American: 99 mL/min/{1.73_m2} (ref >=60)
Globulin: 2.7 g/dL (calc) (ref 1.9–3.7)
Glucose, Bld: 114 mg/dL — ABNORMAL HIGH (ref 65–99)
Potassium: 4.2 mmol/L (ref 3.5–5.3)
Sodium: 140 mmol/L (ref 135–146)
Total Bilirubin: 1.3 mg/dL — ABNORMAL HIGH (ref 0.2–1.2)
Total Protein: 6.7 g/dL (ref 6.1–8.1)

## 2018-03-01 LAB — CBC WITH DIFFERENTIAL/PLATELET
Basophils Absolute: 20 cells/uL (ref 0–200)
Basophils Relative: 0.8 %
Eosinophils Absolute: 80 cells/uL (ref 15–500)
Eosinophils Relative: 3.2 %
HCT: 37 % — ABNORMAL LOW (ref 38.5–50.0)
Hemoglobin: 13.2 g/dL (ref 13.2–17.1)
Lymphs Abs: 695 cells/uL — ABNORMAL LOW (ref 850–3900)
MCH: 34.1 pg — ABNORMAL HIGH (ref 27.0–33.0)
MCHC: 35.7 g/dL (ref 32.0–36.0)
MCV: 95.6 fL (ref 80.0–100.0)
MPV: 9.3 fL (ref 7.5–12.5)
Monocytes Relative: 18.5 %
Neutro Abs: 1243 cells/uL — ABNORMAL LOW (ref 1500–7800)
Neutrophils Relative %: 49.7 %
Platelets: 158 10*3/uL (ref 140–400)
RBC: 3.87 10*6/uL — ABNORMAL LOW (ref 4.20–5.80)
RDW: 13 % (ref 11.0–15.0)
Total Lymphocyte: 27.8 %
WBC mixed population: 463 cells/uL (ref 200–950)
WBC: 2.5 10*3/uL — ABNORMAL LOW (ref 3.8–10.8)

## 2018-03-04 NOTE — Progress Notes (Signed)
WBC count continues to decrease and is 2.5.  Please advise patient to skip his next 2 Enbrel injections and to come in 2 weeks to recheck CBC.  I discussed this plan with Dr. Estanislado Pandy.  Glucose 114.  All other labs are stable.

## 2018-03-25 ENCOUNTER — Other Ambulatory Visit: Payer: Self-pay

## 2018-03-25 DIAGNOSIS — Z79899 Other long term (current) drug therapy: Secondary | ICD-10-CM

## 2018-03-26 LAB — COMPLETE METABOLIC PANEL WITH GFR
AG Ratio: 1.3 (calc) (ref 1.0–2.5)
ALT: 26 U/L (ref 9–46)
AST: 25 U/L (ref 10–35)
Albumin: 3.7 g/dL (ref 3.6–5.1)
Alkaline phosphatase (APISO): 53 U/L (ref 40–115)
BUN: 7 mg/dL (ref 7–25)
CO2: 26 mmol/L (ref 20–32)
Calcium: 8.5 mg/dL — ABNORMAL LOW (ref 8.6–10.3)
Chloride: 107 mmol/L (ref 98–110)
Creat: 0.75 mg/dL (ref 0.70–1.33)
GFR, Est African American: 118 mL/min/{1.73_m2} (ref >=60)
GFR, Est Non African American: 102 mL/min/{1.73_m2} (ref >=60)
Globulin: 2.8 g/dL (calc) (ref 1.9–3.7)
Glucose, Bld: 94 mg/dL (ref 65–99)
Potassium: 4.7 mmol/L (ref 3.5–5.3)
Sodium: 143 mmol/L (ref 135–146)
Total Bilirubin: 0.7 mg/dL (ref 0.2–1.2)
Total Protein: 6.5 g/dL (ref 6.1–8.1)

## 2018-03-26 LAB — CBC WITH DIFFERENTIAL/PLATELET
Basophils Absolute: 19 cells/uL (ref 0–200)
Basophils Relative: 0.8 %
Eosinophils Absolute: 41 cells/uL (ref 15–500)
Eosinophils Relative: 1.7 %
HCT: 35.9 % — ABNORMAL LOW (ref 38.5–50.0)
Hemoglobin: 12.2 g/dL — ABNORMAL LOW (ref 13.2–17.1)
Lymphs Abs: 713 cells/uL — ABNORMAL LOW (ref 850–3900)
MCH: 33.3 pg — ABNORMAL HIGH (ref 27.0–33.0)
MCHC: 34 g/dL (ref 32.0–36.0)
MCV: 98.1 fL (ref 80.0–100.0)
MPV: 9.8 fL (ref 7.5–12.5)
Monocytes Relative: 22.2 %
Neutro Abs: 1094 cells/uL — ABNORMAL LOW (ref 1500–7800)
Neutrophils Relative %: 45.6 %
Platelets: 174 10*3/uL (ref 140–400)
RBC: 3.66 10*6/uL — ABNORMAL LOW (ref 4.20–5.80)
RDW: 13.1 % (ref 11.0–15.0)
Total Lymphocyte: 29.7 %
WBC mixed population: 533 cells/uL (ref 200–950)
WBC: 2.4 10*3/uL — ABNORMAL LOW (ref 3.8–10.8)

## 2018-03-26 NOTE — Progress Notes (Signed)
WBC count continues to trend down. Please advise patient to continue to hold Enbrel for 2 more weeks.  We will recheck in 2 weeks.  RBC and Plts are trending down.  Calcium is borderline low.  Please advise patient to take a calcium supplement daily.

## 2018-04-05 ENCOUNTER — Telehealth: Payer: Self-pay | Admitting: Pharmacy Technician

## 2018-04-05 NOTE — Telephone Encounter (Signed)
Error

## 2018-04-05 NOTE — Telephone Encounter (Signed)
Received a fax from [facility] regarding a prior authorization for Enbrel Sureclick. Authorization has been APPROVED from 04/02/2018 to 08/20/2038.   Will send document to scan center.  Authorization #[id number] Phone # [phone]  8:20 AM Beatriz Chancellor, CPhT

## 2018-04-08 ENCOUNTER — Other Ambulatory Visit: Payer: Self-pay

## 2018-04-08 DIAGNOSIS — Z79899 Other long term (current) drug therapy: Secondary | ICD-10-CM

## 2018-04-09 LAB — COMPLETE METABOLIC PANEL WITH GFR
AG Ratio: 1.2 (calc) (ref 1.0–2.5)
ALT: 23 U/L (ref 9–46)
AST: 21 U/L (ref 10–35)
Albumin: 3.9 g/dL (ref 3.6–5.1)
Alkaline phosphatase (APISO): 56 U/L (ref 40–115)
BUN: 9 mg/dL (ref 7–25)
CO2: 27 mmol/L (ref 20–32)
Calcium: 8.4 mg/dL — ABNORMAL LOW (ref 8.6–10.3)
Chloride: 105 mmol/L (ref 98–110)
Creat: 0.72 mg/dL (ref 0.70–1.33)
GFR, Est African American: 120 mL/min/{1.73_m2} (ref >=60)
GFR, Est Non African American: 104 mL/min/{1.73_m2} (ref >=60)
Globulin: 3.2 g/dL (calc) (ref 1.9–3.7)
Glucose, Bld: 119 mg/dL — ABNORMAL HIGH (ref 65–99)
Potassium: 4.4 mmol/L (ref 3.5–5.3)
Sodium: 140 mmol/L (ref 135–146)
Total Bilirubin: 0.9 mg/dL (ref 0.2–1.2)
Total Protein: 7.1 g/dL (ref 6.1–8.1)

## 2018-04-09 LAB — CBC WITH DIFFERENTIAL/PLATELET
Basophils Absolute: 30 cells/uL (ref 0–200)
Basophils Relative: 0.9 %
Eosinophils Absolute: 139 cells/uL (ref 15–500)
Eosinophils Relative: 4.2 %
HCT: 36 % — ABNORMAL LOW (ref 38.5–50.0)
Hemoglobin: 12.3 g/dL — ABNORMAL LOW (ref 13.2–17.1)
Lymphs Abs: 729 cells/uL — ABNORMAL LOW (ref 850–3900)
MCH: 33.5 pg — ABNORMAL HIGH (ref 27.0–33.0)
MCHC: 34.2 g/dL (ref 32.0–36.0)
MCV: 98.1 fL (ref 80.0–100.0)
MPV: 9.1 fL (ref 7.5–12.5)
Monocytes Relative: 20.3 %
Neutro Abs: 1733 cells/uL (ref 1500–7800)
Neutrophils Relative %: 52.5 %
Platelets: 203 10*3/uL (ref 140–400)
RBC: 3.67 10*6/uL — ABNORMAL LOW (ref 4.20–5.80)
RDW: 12.8 % (ref 11.0–15.0)
Total Lymphocyte: 22.1 %
WBC mixed population: 670 cells/uL (ref 200–950)
WBC: 3.3 10*3/uL — ABNORMAL LOW (ref 3.8–10.8)

## 2018-04-09 NOTE — Progress Notes (Signed)
I discussed lab results with Dr. Estanislado Pandy.  I spoke with the patient about his recent lab results and recommendation made by Dr. Estanislado Pandy.  He was advised to restart on Enbrel and inject every 21 days.  He declined a prednisone taper at this time.  He was advised to notify us if he continues to have increased joint pain and joint swelling.  We will recheck CBC in 1 month.

## 2018-05-06 ENCOUNTER — Telehealth: Payer: Self-pay | Admitting: Rheumatology

## 2018-05-06 NOTE — Telephone Encounter (Signed)
Marlowe Kays called stating that Trampas is due for an Enbrel injection on Wednesday 05/08/18 and she is checking to see if he needs to come in for bloodwork to check his white blood count before taking the injection.  Please call back at [phone]

## 2018-05-06 NOTE — Telephone Encounter (Signed)
Please have patient return for lab work prior to his next injection.

## 2018-05-06 NOTE — Telephone Encounter (Signed)
Daniel Griffin that patient should return for lab work prior to his next injection. She verbalized understanding and will let patient know.

## 2018-05-07 ENCOUNTER — Other Ambulatory Visit: Payer: Self-pay

## 2018-05-07 DIAGNOSIS — Z79899 Other long term (current) drug therapy: Secondary | ICD-10-CM

## 2018-05-08 ENCOUNTER — Other Ambulatory Visit: Payer: Self-pay

## 2018-05-08 DIAGNOSIS — D72818 Other decreased white blood cell count: Secondary | ICD-10-CM

## 2018-05-08 DIAGNOSIS — Z9225 Personal history of immunosupression therapy: Secondary | ICD-10-CM

## 2018-05-08 LAB — CBC WITH DIFFERENTIAL/PLATELET
Basophils Absolute: 20 cells/uL (ref 0–200)
Basophils Relative: 0.8 %
Eosinophils Absolute: 98 cells/uL (ref 15–500)
Eosinophils Relative: 3.9 %
HCT: 37.8 % — ABNORMAL LOW (ref 38.5–50.0)
Hemoglobin: 13 g/dL — ABNORMAL LOW (ref 13.2–17.1)
Lymphs Abs: 1015 cells/uL (ref 850–3900)
MCH: 32.8 pg (ref 27.0–33.0)
MCHC: 34.4 g/dL (ref 32.0–36.0)
MCV: 95.5 fL (ref 80.0–100.0)
MPV: 9.2 fL (ref 7.5–12.5)
Monocytes Relative: 26.8 %
Neutro Abs: 698 cells/uL — ABNORMAL LOW (ref 1500–7800)
Neutrophils Relative %: 27.9 %
Platelets: 188 10*3/uL (ref 140–400)
RBC: 3.96 10*6/uL — ABNORMAL LOW (ref 4.20–5.80)
RDW: 12.1 % (ref 11.0–15.0)
Total Lymphocyte: 40.6 %
WBC mixed population: 670 cells/uL (ref 200–950)
WBC: 2.5 10*3/uL — ABNORMAL LOW (ref 3.8–10.8)

## 2018-05-08 LAB — COMPLETE METABOLIC PANEL WITH GFR
AG Ratio: 1.2 (calc) (ref 1.0–2.5)
ALT: 24 U/L (ref 9–46)
AST: 23 U/L (ref 10–35)
Albumin: 4.2 g/dL (ref 3.6–5.1)
Alkaline phosphatase (APISO): 55 U/L (ref 40–115)
BUN: 9 mg/dL (ref 7–25)
CO2: 28 mmol/L (ref 20–32)
Calcium: 9 mg/dL (ref 8.6–10.3)
Chloride: 102 mmol/L (ref 98–110)
Creat: 0.83 mg/dL (ref 0.70–1.33)
GFR, Est African American: 113 mL/min/{1.73_m2} (ref >=60)
GFR, Est Non African American: 98 mL/min/{1.73_m2} (ref >=60)
Globulin: 3.4 g/dL (calc) (ref 1.9–3.7)
Glucose, Bld: 118 mg/dL — ABNORMAL HIGH (ref 65–99)
Potassium: 4.2 mmol/L (ref 3.5–5.3)
Sodium: 139 mmol/L (ref 135–146)
Total Bilirubin: 1.1 mg/dL (ref 0.2–1.2)
Total Protein: 7.6 g/dL (ref 6.1–8.1)

## 2018-05-08 NOTE — Progress Notes (Signed)
WBC count continues to trend down. His last Enbrel injection was 21 days ago and he is due for his next injection today. A referral to hematology will be placed for evaluation.  He was advised to hold his next Enbrel injection until he sees the hematologist.   Anemia improved.

## 2018-05-22 ENCOUNTER — Other Ambulatory Visit: Payer: Self-pay | Admitting: Hematology

## 2018-05-22 DIAGNOSIS — D849 Immunodeficiency, unspecified: Secondary | ICD-10-CM

## 2018-05-22 DIAGNOSIS — D899 Disorder involving the immune mechanism, unspecified: Secondary | ICD-10-CM

## 2018-05-22 DIAGNOSIS — D709 Neutropenia, unspecified: Secondary | ICD-10-CM

## 2018-05-22 DIAGNOSIS — D72819 Decreased white blood cell count, unspecified: Secondary | ICD-10-CM | POA: Insufficient documentation

## 2018-05-22 NOTE — Assessment & Plan Note (Signed)
I reviewed the patient's external records, including his rheumatology clinic visits and laboratory studies.  The patient's WBC remained relatively normal until July 2019, at which time WBC dropped to mid 2K with ANC fluctuating between 712 and 1000. Enbrel was held for several weeks starting in late July through mid August with improvement in WBC and ANC (~1700).  Differential diagnosis for leukopenia with neutropenia in the setting of seropositive RA on TNF alpha inhibitor includes drug-indued, Felty syndrome, and large granular lymphocyte syndrome, among others.  I have ordered flow cytometry to rule out monoclonal lymphocyte proliferation. I have also ordered nutritional and viral studies to assess for other causes. Given that TNF alpha inhibitor is associated with increased risk of lymphoma, I will order CT CAP with contrast to assess for any lymphadenopathy.  If the above work-up is unremarkable, we can consider bone marrow biopsy to rule out any lymphoproliferative disorder.

## 2018-05-23 ENCOUNTER — Other Ambulatory Visit: Payer: Self-pay | Admitting: Hematology

## 2018-05-23 DIAGNOSIS — D709 Neutropenia, unspecified: Secondary | ICD-10-CM

## 2018-05-23 DIAGNOSIS — M0579 Rheumatoid arthritis with rheumatoid factor of multiple sites without organ or systems involvement: Secondary | ICD-10-CM

## 2018-05-23 NOTE — Assessment & Plan Note (Deleted)
As discussed above, Enbrel was held for approximately 1 month with improvement in leukopenia and neutropenia. Since Enbrel was resumed in the end of August 2019, neutropenia and leukopenia of both slightly worsened. As TNF alpha inhibitors can cause drug-induced neutropenia, if the above work-up does not reveal any clear etiology, then changing therapy for RA should be considered.

## 2018-05-23 NOTE — Assessment & Plan Note (Deleted)
I reviewed the patient's external documents, including rheumatology clinic notes and lab studies. Patient's WBC fell to mid-2k's with ANC fluctuating between 700 and 1200 in July 2019; his CBC prior to that time had been relatively normal. When Enbrel was held for approximately 1 month, his WBC and ANC improved while off therapy. Ddx includes drug-induced neutropenia, large granular lymphocyte syndrome, Felty syndrome and less commonly lymphoproliferative disorder in the setting of immunosuppression. I have ordered nutritional and viral studies to rule out potentially reversible causes.  Given that TNF alpha inhibitor is associated with increased risk of lymphoma, I will order CT CAP with contrast to rule out lymphoproliferative disorder. If above studies do not demonstrate any clear etiology and patient continues to have persistent leukopenia with neutropenia, we will pursue primary biopsy to rule out any pulmonary disorder.

## 2018-05-23 NOTE — Assessment & Plan Note (Addendum)
I reviewed the patient's external documents, including rheumatology clinic notes and lab studies. Patient's WBC fell to mid-2k's with ANC fluctuating between 700 and 1200 in July 2019; his CBC prior to that time had been relatively normal. When Enbrel was held for approximately 1 month, his WBC and ANC improved while off therapy. Ddx includes drug-induced neutropenia, large granular lymphocyte syndrome, Felty syndrome and less commonly lymphoproliferative disorder in the setting of immunosuppression. Peripheral blood smear was reviewed and showed overall normal white blood cell morphology and absence of large granular lymphocytes, suggesting possible drug side effect. I have ordered nutritional (B12, folate, zinc, copper) and viral studies (HIV, Hep B/C, TB quantiferon, EBV) to rule out potentially reversible causes.  Given that TNF alpha inhibitor is associated with increased risk of lymphoma, I will order CT CAP with contrast to rule out lymphoproliferative disorder. If above studies do not demonstrate any clear etiology and patient continues to have persistent leukopenia with neutropenia, we will consider bone marrow biopsy for further evaluation. I counseled the patient on hand hygiene to reduce his risk of infection. No indication for prophylactic antibiotics at this time.

## 2018-05-23 NOTE — Assessment & Plan Note (Addendum)
As discussed above, Enbrel was held for approximately 1 month with improvement in leukopenia and neutropenia. Since Enbrel was briefly resumed in the end of August 2019, neutropenia and leukopenia of both slightly worsened. Since then, Enbrel has been held.  As TNF alpha inhibitors can cause drug-induced neutropenia, if the above work-up does not reveal any clear etiology, then changing therapy for RA should be considered.

## 2018-05-24 ENCOUNTER — Inpatient Hospital Stay: Payer: BLUE CROSS/BLUE SHIELD | Attending: Hematology | Admitting: Hematology

## 2018-05-24 ENCOUNTER — Other Ambulatory Visit: Payer: Self-pay

## 2018-05-24 ENCOUNTER — Encounter: Payer: Self-pay | Admitting: Hematology

## 2018-05-24 ENCOUNTER — Inpatient Hospital Stay: Payer: BLUE CROSS/BLUE SHIELD

## 2018-05-24 VITALS — BP 134/75 | HR 73 | Temp 98.6°F | Resp 18 | Ht 69.0 in | Wt 213.0 lb

## 2018-05-24 DIAGNOSIS — D709 Neutropenia, unspecified: Secondary | ICD-10-CM | POA: Diagnosis not present

## 2018-05-24 DIAGNOSIS — Z79899 Other long term (current) drug therapy: Secondary | ICD-10-CM | POA: Insufficient documentation

## 2018-05-24 DIAGNOSIS — D72819 Decreased white blood cell count, unspecified: Secondary | ICD-10-CM | POA: Diagnosis not present

## 2018-05-24 DIAGNOSIS — D849 Immunodeficiency, unspecified: Secondary | ICD-10-CM

## 2018-05-24 DIAGNOSIS — M0579 Rheumatoid arthritis with rheumatoid factor of multiple sites without organ or systems involvement: Secondary | ICD-10-CM | POA: Insufficient documentation

## 2018-05-24 DIAGNOSIS — D899 Disorder involving the immune mechanism, unspecified: Secondary | ICD-10-CM

## 2018-05-24 LAB — CBC WITH DIFFERENTIAL (CANCER CENTER ONLY)
Basophils Absolute: 0 10*3/uL (ref 0.0–0.1)
Basophils Relative: 1 %
Eosinophils Absolute: 0.1 10*3/uL (ref 0.0–0.5)
Eosinophils Relative: 4 %
HCT: 37.5 % — ABNORMAL LOW (ref 38.7–49.9)
Hemoglobin: 13 g/dL (ref 13.0–17.1)
Lymphocytes Relative: 39 %
Lymphs Abs: 1 10*3/uL (ref 0.9–3.3)
MCH: 33.2 pg (ref 28.0–33.4)
MCHC: 34.7 g/dL (ref 32.0–35.9)
MCV: 95.7 fL (ref 82.0–98.0)
Monocytes Absolute: 0.6 10*3/uL (ref 0.1–0.9)
Monocytes Relative: 26 %
Neutro Abs: 0.7 10*3/uL — ABNORMAL LOW (ref 1.5–6.5)
Neutrophils Relative %: 30 %
Platelet Count: 154 10*3/uL (ref 145–400)
RBC: 3.92 MIL/uL — ABNORMAL LOW (ref 4.20–5.70)
RDW: 13.1 % (ref 11.1–15.7)
WBC Count: 2.5 10*3/uL — ABNORMAL LOW (ref 4.0–10.0)

## 2018-05-24 LAB — CMP (CANCER CENTER ONLY)
ALT: 25 U/L (ref 0–44)
AST: 28 U/L (ref 15–41)
Albumin: 3.7 g/dL (ref 3.5–5.0)
Alkaline Phosphatase: 59 U/L (ref 38–126)
Anion gap: 11 (ref 5–15)
BUN: 13 mg/dL (ref 6–20)
CO2: 22 mmol/L (ref 22–32)
Calcium: 9.1 mg/dL (ref 8.9–10.3)
Chloride: 108 mmol/L (ref 98–111)
Creatinine: 0.76 mg/dL (ref 0.61–1.24)
GFR, Est AFR Am: >60 mL/min (ref >60)
GFR, Estimated: >60 mL/min (ref >60)
Glucose, Bld: 108 mg/dL — ABNORMAL HIGH (ref 70–99)
Potassium: 4.4 mmol/L (ref 3.5–5.1)
Sodium: 141 mmol/L (ref 135–145)
Total Bilirubin: 1.1 mg/dL (ref 0.3–1.2)
Total Protein: 7.7 g/dL (ref 6.5–8.1)

## 2018-05-24 LAB — IRON AND TIBC
Iron: 104 ug/dL (ref 42–163)
Saturation Ratios: 31 % — ABNORMAL LOW (ref 42–163)
TIBC: 334 ug/dL (ref 202–409)
UIBC: 230 ug/dL

## 2018-05-24 LAB — FERRITIN: Ferritin: 328 ng/mL (ref 24–336)

## 2018-05-24 LAB — LACTATE DEHYDROGENASE: LDH: 215 U/L — ABNORMAL HIGH (ref 98–192)

## 2018-05-24 LAB — SAVE SMEAR

## 2018-05-24 LAB — FOLATE: Folate: 12.7 ng/mL (ref >5.9)

## 2018-05-24 LAB — VITAMIN B12: Vitamin B-12: 274 pg/mL (ref 180–914)

## 2018-05-24 NOTE — Progress Notes (Signed)
[facility] CONSULT NOTE  Patient Care Team: Patient, No Pcp Per as PCP - General (General Practice)  ASSESSMENT & PLAN:  Leukopenia I reviewed the patient's external documents, including rheumatology clinic notes and lab studies. Patient's WBC fell to mid-2k's with ANC fluctuating between 700 and 1200 in July 2019; his CBC prior to that time had been relatively normal. When Enbrel was held for approximately 1 month, his WBC and ANC improved while off therapy. Ddx includes drug-induced neutropenia, large granular lymphocyte syndrome, Felty syndrome and less commonly lymphoproliferative disorder in the setting of immunosuppression. Peripheral blood smear was reviewed and showed overall normal white blood cell morphology and absence of large granular lymphocytes, suggesting possible drug side effect. I have ordered nutritional (B12, folate, zinc, copper) and viral studies (HIV, Hep B/C, TB quantiferon, EBV) to rule out potentially reversible causes.  Given that TNF alpha inhibitor is associated with increased risk of lymphoma, I will order CT CAP with contrast to rule out lymphoproliferative disorder. If above studies do not demonstrate any clear etiology and patient continues to have persistent leukopenia with neutropenia, we will consider bone marrow biopsy for further evaluation. I counseled the patient on hand hygiene to reduce his risk of infection. No indication for prophylactic antibiotics at this time.  Rheumatoid arthritis involving multiple sites with positive rheumatoid factor ([place]) As discussed above, Enbrel was held for approximately 1 month with improvement in leukopenia and neutropenia. Since Enbrel was briefly resumed in the end of August 2019, neutropenia and leukopenia of both slightly worsened. Since then, Enbrel has been held.  As TNF alpha inhibitors can cause drug-induced neutropenia, if the above work-up does not reveal any clear etiology, then changing  therapy for RA should be considered.   High risk medications (not anticoagulants) long-term use Enbrel was briefly resumed in August 2019, but has been held since then due to worsening leukopenia with neutropenia. I agree with holding the immunosuppressant for now.  If the work-up is unrevealing, then changing to a different RA therapy should be considered.    Orders Placed This Encounter  Procedures  . CT CHEST W CONTRAST    Standing Status:   Future    Standing Expiration Date:   05/24/2019    Order Specific Question:   ** REASON FOR EXAM (FREE TEXT)    Answer:   Worsening leukopenia and neutropenia on chronic immunosuppressant, rule out lymphoproliferative disorder, such as lymphoma    Order Specific Question:   If indicated for the ordered procedure, I authorize the administration of contrast media per Radiology protocol    Answer:   Yes    Order Specific Question:   Preferred imaging location?    Answer:   [facility] Specific Question:   Radiology Contrast Protocol - do NOT remove file path    Answer:   \\[email or web address].[email or web address]  . CT ABDOMEN PELVIS W CONTRAST    Standing Status:   Future    Standing Expiration Date:   05/24/2019    Order Specific Question:   ** REASON FOR EXAM (FREE TEXT)    Answer:   Worsening leukopenia and neutropenia on chronic immunosuppressant, rule out lymphoproliferative disorder, such as lymphoma    Order Specific Question:   If indicated for the ordered procedure, I authorize the administration of contrast media per Radiology protocol    Answer:   Yes    Order Specific Question:   Preferred imaging location?    Answer:   [facility]    Order Specific Question:   Is Oral Contrast requested for this exam?    Answer:   Yes, Per Radiology protocol    Order Specific Question:   Radiology Contrast Protocol - do NOT remove file path    Answer:   \\[email or web address]\[email or web address]\CTProtocols.[email or web address]   All questions were  answered. The patient knows to call the clinic with any problems, questions or concerns.  Return to clinic in 2 weeks for labs and imaging follow-up.  If the patient requires bone marrow biopsy, we will reschedule his clinic appointment to be after his procedure.  Tish Men, MD 05/24/2018 9:37 AM   CHIEF COMPLAINTS/PURPOSE OF CONSULTATION:  "I am here for low white blood cells"  HISTORY OF PRESENTING ILLNESS:  Daniel Griffin 57 y.o. male is here because of worsening leukopenia w/ neutropenia on chronic immunosuppression with Enbrel.   Patient reports that he has been on Enbrel since at least 2016 for rheumatoid arthritis.  Starting in July 2019, lab studies showed new onset leukopenia with neutropenia, for which his rheumatologist held Enbrel for approximately 4 weeks with improvement in leukopenia and resolution of neutropenia.  Since then he was restarted on Enbrel but only received one injection, but his subsequent treatment has been held due to worsening leukopenia with neutropenia.   Patient reports chronic diffuse joint pain, for which he takes Tylenol regularly as well as intermittent NSAIDs, but he denies any unexplained fever, chill, weight loss, night sweats, recent or frequent infections, or lymphadenopathy.  He works full-time in [employer] as a Therapist, occupational.  MEDICAL HISTORY:  Past Medical History:  Diagnosis Date  . Arthritis     SURGICAL HISTORY: Past Surgical History:  Procedure Laterality Date  . APPENDECTOMY      SOCIAL HISTORY: Social History   Socioeconomic History  . Marital status: Married    Spouse name: Not on file  . Number of children: Not on file  . Years of education: Not on file  . Highest education level: Not on file  Occupational History  . Not on file  Social Needs  . Financial resource strain: Not on file  . Food insecurity:    Worry: Not on file    Inability: Not on file  . Transportation needs:    Medical: Not on file     Non-medical: Not on file  Tobacco Use  . Smoking status: Never Smoker  . Smokeless tobacco: Current User    Types: Chew, Snuff  Substance and Sexual Activity  . Alcohol use: Yes  . Drug use: No  . Sexual activity: Not on file  Lifestyle  . Physical activity:    Days per week: Not on file    Minutes per session: Not on file  . Stress: Not on file  Relationships  . Social connections:    Talks on phone: Not on file    Gets together: Not on file    Attends religious service: Not on file    Active member of club or organization: Not on file    Attends meetings of clubs or organizations: Not on file    Relationship status: Not on file  . Intimate partner violence:    Fear of current or ex partner: Not on file    Emotionally abused: Not on file    Physically abused: Not on file    Forced sexual activity: Not on file  Other Topics Concern  . Not on file  Social History Narrative  . Not on  file    FAMILY HISTORY: Family History  Problem Relation Age of Onset  . Alzheimer's disease Father   . Healthy Son   . Healthy Daughter   . Rheum arthritis Daughter   . Healthy Daughter     ALLERGIES:  has No Known Allergies.  MEDICATIONS:  Current Outpatient Medications  Medication Sig Dispense Refill  . ENBREL SURECLICK 50 MG/ML injection INJECT '50MG'$  (1 PEN) SUBCUTANEOUSLY ONCE A WEEK. 11.76 mL 0   No current facility-administered medications for this visit.     REVIEW OF SYSTEMS:   Constitutional: ( - ) fevers, ( - )  chills , ( - ) night sweats Eyes: ( - ) blurriness of vision, ( - ) double vision, ( - ) watery eyes Ears, nose, mouth, throat, and face: ( - ) mucositis, ( - ) sore throat Respiratory: ( - ) cough, ( - ) dyspnea, ( - ) wheezes Cardiovascular: ( - ) palpitation, ( - ) chest discomfort, ( - ) lower extremity swelling Gastrointestinal:  ( - ) nausea, ( - ) heartburn, ( - ) change in bowel habits Skin: ( - ) abnormal skin rashes Lymphatics: ( - ) new  lymphadenopathy, ( - ) easy bruising Neurological: ( - ) numbness, ( - ) tingling, ( - ) new weaknesses Behavioral/Psych: ( - ) mood change, ( - ) new changes  All other systems were reviewed with the patient and are negative.  PHYSICAL EXAMINATION: ECOG PERFORMANCE STATUS: 0 - Asymptomatic  Vitals:   05/24/18 0847  BP: 134/75  Pulse: 73  Resp: 18  Temp: 98.6 F (37 C)  SpO2: 98%   Filed Weights   05/24/18 0847  Weight: 213 lb (96.6 kg)    GENERAL: alert, no distress and comfortable, muscular appearing SKIN: skin color, texture, turgor are normal, no rashes or significant lesions EYES: conjunctiva are pink and non-injected, sclera clear OROPHARYNX: no exudate, no erythema; lips, buccal mucosa, and tongue normal  NECK: supple, non-tender LYMPH:  no palpable lymphadenopathy in the cervical or axillary region  LUNGS: clear to auscultation and percussion with normal breathing effort HEART: regular rate & rhythm and no murmurs and no lower extremity edema ABDOMEN: soft, non-tender, non-distended, normal bowel sounds Musculoskeletal: no cyanosis of digits and no clubbing  PSYCH: alert & oriented x 3, fluent speech NEURO: no focal motor/sensory deficits  LABORATORY DATA:  I have reviewed the data as listed Lab Results  Component Value Date   WBC 2.5 (L) 05/24/2018   HGB 13.0 05/24/2018   HCT 37.5 (L) 05/24/2018   MCV 95.7 05/24/2018   PLT 154 05/24/2018   Lab Results  Component Value Date   NA 139 05/07/2018   K 4.2 05/07/2018   CL 102 05/07/2018   CO2 28 05/07/2018   I personally reviewed the patient's peripheral blood smear today.  There was no peripheral blast.  The overall white blood cell morphology, including neutrophils, lymphocytes and monocytes, appeared normal.  There were decreased number of neutrophils.  There was no large granular lymphocyte seen. The red blood cells were of normal morphology. There was no schistocytosis.  The platelets are of normal size and  I have verified that there were no platelet clumping.

## 2018-05-24 NOTE — Assessment & Plan Note (Signed)
Enbrel was briefly resumed in August 2019, but has been held since then due to worsening leukopenia with neutropenia. I agree with holding the immunosuppressant for now.  If the work-up is unrevealing, then changing to a different RA therapy should be considered.

## 2018-05-25 LAB — COPPER, SERUM: Copper: 118 ug/dL (ref 72–166)

## 2018-05-25 LAB — HIV ANTIBODY (ROUTINE TESTING W REFLEX): HIV Screen 4th Generation wRfx: NONREACTIVE

## 2018-05-25 LAB — HEPATITIS B SURFACE ANTIBODY,QUALITATIVE: Hep B S Ab: NONREACTIVE

## 2018-05-25 LAB — ZINC: Zinc: 77 ug/dL (ref 56–134)

## 2018-05-25 LAB — HCV COMMENT:

## 2018-05-25 LAB — THYROID PANEL WITH TSH
Free Thyroxine Index: 2.2 (ref 1.2–4.9)
T3 Uptake Ratio: 31 % (ref 24–39)
T4, Total: 7.2 ug/dL (ref 4.5–12.0)
TSH: 1.61 u[IU]/mL (ref 0.450–4.500)

## 2018-05-25 LAB — HEPATITIS C ANTIBODY (REFLEX): HCV Ab: <0.1 s/co ratio (ref 0.0–0.9)

## 2018-05-25 LAB — HEPATITIS B SURFACE ANTIGEN: Hepatitis B Surface Ag: NEGATIVE

## 2018-05-27 ENCOUNTER — Telehealth: Payer: Self-pay | Admitting: Pharmacy Technician

## 2018-05-27 ENCOUNTER — Telehealth: Payer: Self-pay | Admitting: Rheumatology

## 2018-05-27 LAB — QUANTIFERON-TB GOLD PLUS (RQFGPL)
QuantiFERON Mitogen Value: 5.46 IU/mL
QuantiFERON Nil Value: 0.25 IU/mL
QuantiFERON TB1 Ag Value: 0.26 IU/mL
QuantiFERON TB2 Ag Value: 0.3 IU/mL

## 2018-05-27 LAB — QUANTIFERON-TB GOLD PLUS: QuantiFERON-TB Gold Plus: NEGATIVE

## 2018-05-27 LAB — EPSTEIN BARR VRS(EBV DNA BY PCR)
EBV DNA QN by PCR: NEGATIVE copies/mL
log10 EBV DNA Qn PCR: UNDETERMINED log10 copy/mL

## 2018-05-27 NOTE — Telephone Encounter (Addendum)
Called patient to discuss medication.  Reviewed information from Dr. Maylon Peppers about neutropenia and stopping Enbrel.  Informed patient that he should not continue Enbrel.  He is to have blood work with Dr. Maylon Peppers on the 18th.   Discussed the plan is to switch him to [facility] as it is not associated with neutropenia.    Patient is agreeable to plan.  Asked to be sent medication information in the mail.  We will follow-up pending future lab results.  Prior authorization for Maureen Chatters was already approved.

## 2018-05-27 NOTE — Telephone Encounter (Signed)
Received notification from [facility] regarding a prior authorization for [place]. Authorization has been APPROVED from 05/27/18 to 08/20/2038.   Will send document to scan center.  Authorization # [id number] Phone # [phone]  Per plan, patient can use any [facility] specialty network pharmacy. Patient has $200 copay, copay card eligible. Please send prescription to [facility].  10:25 AM Beatriz Chancellor, CPhT

## 2018-05-27 NOTE — Addendum Note (Signed)
Addended by: Mariella Saa C on: 05/27/2018 01:57 PM   Modules accepted: Orders

## 2018-05-27 NOTE — Telephone Encounter (Signed)
I received a phone call from Dr. Maylon Peppers.  He did evaluation for Mr. Tukes neutropenia.  He believes that neutropenia is related to anti-TNF's.  He also found some NK cells on the peripheral blood smear.  His recommendations are to change his immunosuppressive therapy once his WBC count recovers to some extent.  He will be getting some repeat CBC on him.  I discussed possible option of starting him on Orencia.  We will apply for Orencia. Bo Merino, MD

## 2018-06-02 ENCOUNTER — Other Ambulatory Visit: Payer: Self-pay | Admitting: Hematology

## 2018-06-02 NOTE — Progress Notes (Unsigned)
.  tc

## 2018-06-03 ENCOUNTER — Other Ambulatory Visit: Payer: Self-pay | Admitting: Rheumatology

## 2018-06-03 NOTE — Telephone Encounter (Signed)
He will be switching therapies.  Do not refill Enbrel.

## 2018-06-03 NOTE — Telephone Encounter (Signed)
Last Visit: 02/22/18 Next visit: 07/29/18 Labs: 05/24/18 WBC 2.5 RBC 3.92 HCT 37.5 Glucose 108 TB Gold: 05/24/18 Neg   Okay to refill Enbrel?

## 2018-06-07 ENCOUNTER — Inpatient Hospital Stay (HOSPITAL_BASED_OUTPATIENT_CLINIC_OR_DEPARTMENT_OTHER): Payer: BLUE CROSS/BLUE SHIELD | Admitting: Hematology

## 2018-06-07 ENCOUNTER — Other Ambulatory Visit: Payer: Self-pay | Admitting: *Deleted

## 2018-06-07 ENCOUNTER — Ambulatory Visit (HOSPITAL_BASED_OUTPATIENT_CLINIC_OR_DEPARTMENT_OTHER)
Admission: RE | Admit: 2018-06-07 | Discharge: 2018-06-07 | Disposition: A | Discharge status: Home / Self Care | Payer: BLUE CROSS/BLUE SHIELD | Source: Ambulatory Visit | Attending: Hematology | Admitting: Hematology

## 2018-06-07 ENCOUNTER — Inpatient Hospital Stay: Payer: BLUE CROSS/BLUE SHIELD

## 2018-06-07 ENCOUNTER — Other Ambulatory Visit: Payer: BLUE CROSS/BLUE SHIELD

## 2018-06-07 ENCOUNTER — Encounter (HOSPITAL_BASED_OUTPATIENT_CLINIC_OR_DEPARTMENT_OTHER): Payer: Self-pay

## 2018-06-07 ENCOUNTER — Telehealth: Payer: Self-pay | Admitting: *Deleted

## 2018-06-07 ENCOUNTER — Encounter: Payer: Self-pay | Admitting: Hematology

## 2018-06-07 VITALS — BP 141/87 | HR 80 | Temp 98.2°F | Resp 18 | Wt 217.0 lb

## 2018-06-07 DIAGNOSIS — M0579 Rheumatoid arthritis with rheumatoid factor of multiple sites without organ or systems involvement: Secondary | ICD-10-CM

## 2018-06-07 DIAGNOSIS — D709 Neutropenia, unspecified: Secondary | ICD-10-CM

## 2018-06-07 DIAGNOSIS — Z7185 Encounter for immunization safety counseling: Secondary | ICD-10-CM

## 2018-06-07 DIAGNOSIS — Z79899 Other long term (current) drug therapy: Secondary | ICD-10-CM

## 2018-06-07 DIAGNOSIS — Z7189 Other specified counseling: Secondary | ICD-10-CM

## 2018-06-07 DIAGNOSIS — D61818 Other pancytopenia: Secondary | ICD-10-CM

## 2018-06-07 DIAGNOSIS — D72819 Decreased white blood cell count, unspecified: Secondary | ICD-10-CM | POA: Diagnosis not present

## 2018-06-07 DIAGNOSIS — E279 Disorder of adrenal gland, unspecified: Secondary | ICD-10-CM | POA: Insufficient documentation

## 2018-06-07 DIAGNOSIS — K760 Fatty (change of) liver, not elsewhere classified: Secondary | ICD-10-CM | POA: Diagnosis not present

## 2018-06-07 DIAGNOSIS — R161 Splenomegaly, not elsewhere classified: Secondary | ICD-10-CM | POA: Diagnosis not present

## 2018-06-07 LAB — CMP (CANCER CENTER ONLY)
ALT: 26 U/L (ref 0–44)
AST: 27 U/L (ref 15–41)
Albumin: 3.5 g/dL (ref 3.5–5.0)
Alkaline Phosphatase: 65 U/L (ref 38–126)
Anion gap: 8 (ref 5–15)
BUN: 8 mg/dL (ref 6–20)
CO2: 25 mmol/L (ref 22–32)
Calcium: 8.5 mg/dL — ABNORMAL LOW (ref 8.9–10.3)
Chloride: 103 mmol/L (ref 98–111)
Creatinine: 0.82 mg/dL (ref 0.61–1.24)
GFR, Est AFR Am: >60 mL/min (ref >60)
GFR, Est Non Af Am: >60 mL/min (ref >60)
Glucose, Bld: 101 mg/dL — ABNORMAL HIGH (ref 70–99)
Potassium: 4.6 mmol/L (ref 3.5–5.1)
Sodium: 136 mmol/L (ref 135–145)
Total Bilirubin: 1.1 mg/dL (ref 0.3–1.2)
Total Protein: 7.3 g/dL (ref 6.5–8.1)

## 2018-06-07 LAB — CBC WITH DIFFERENTIAL (CANCER CENTER ONLY)
Abs Immature Granulocytes: 0.01 10*3/uL (ref 0.00–0.07)
Basophils Absolute: 0 10*3/uL (ref 0.0–0.1)
Basophils Relative: 1 %
Eosinophils Absolute: 0.1 10*3/uL (ref 0.0–0.5)
Eosinophils Relative: 4 %
HCT: 35.6 % — ABNORMAL LOW (ref 39.0–52.0)
Hemoglobin: 11.8 g/dL — ABNORMAL LOW (ref 13.0–17.0)
Immature Granulocytes: 1 %
Lymphocytes Relative: 40 %
Lymphs Abs: 0.9 10*3/uL (ref 0.7–4.0)
MCH: 31.6 pg (ref 26.0–34.0)
MCHC: 33.1 g/dL (ref 30.0–36.0)
MCV: 95.4 fL (ref 80.0–100.0)
Monocytes Absolute: 0.6 10*3/uL (ref 0.1–1.0)
Monocytes Relative: 30 %
Neutro Abs: 0.5 10*3/uL — ABNORMAL LOW (ref 1.7–7.7)
Neutrophils Relative %: 24 %
Platelet Count: 142 10*3/uL — ABNORMAL LOW (ref 150–400)
RBC: 3.73 MIL/uL — ABNORMAL LOW (ref 4.22–5.81)
RDW: 13.5 % (ref 11.5–15.5)
WBC Count: 2.1 10*3/uL — ABNORMAL LOW (ref 4.0–10.5)
nRBC: 0 % (ref 0.0–0.2)

## 2018-06-07 MED ORDER — IOPAMIDOL (ISOVUE-300) INJECTION 61%
100.0000 mL | Freq: Once | INTRAVENOUS | Status: AC | PRN
  Administered 2018-06-07: 100 mL via INTRAVENOUS

## 2018-06-07 NOTE — Progress Notes (Addendum)
[facility] OFFICE PROGRESS NOTE  Patient Care Team: Default, Provider, MD as PCP - General  HEME/ONC OVERVIEW: 1. Pancytopenia of unknown etiology  ASSESSMENT & PLAN:  Pancytopenia -Patient has had extensive laboratory work-up, including nutritional studies and viral serologies, that did not reveal any etiology -Flow cytometry of the peripheral blood did not show any monoclonal T or B-cell population, but did demonstrate a small population of NK cells comprising 22% of lymphocytes -The etiology of the NK cells is not clear, as it can be elevated to chronic immunosuppression by TNF alpha inhibitors -The patient has been off Enbrel for nearly 4 weeks; unfortunately, his pancytopenia has gotten worse since his last visit -I reviewed the peripheral blood smear, which showed leukopenia with neutropenia; the morphology of white blood cells appeared overall normal, but there was occasional pseudo-Pelger-Huet anomaly, which is nonspecific and can be seen with drug effect -I independently reviewed the CT CAP images, and agreed with the finding that there was no evidence of lymphoproliferative disease -Given the worsening pancytopenia of unknown etiology, CT-guided bone marrow biopsy was done in October 2019, which 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; no definite dysplasia to suggest MDS or other lymphoproliferative disease  -At this time, it appears that the leukopenia with neutropenia appears to be secondary to drug related, including TNF-alpha inhibitor and NSAIDs  -Patient denies any symptoms of infection at this time; if he develops frequent infection or has prolonged neutropenia, we can consider G-CSF  -I encouraged patient to practice hand hygiene and obtain annual influenza vaccine  Rheumatoid arthritis -Patient has been off Enbrel for nearly 4 weeks -He reports taking daily Goody powder for joint pain -Given unexplained pancytopenia, possibly due to drug  side effect, his rheumatoid arthritis treatment has been on hold -I discussed with the patient that NSAIDs and TNF alpha inhibitors can cause neutropenia in the setting of rheumatoid arthritis; therefore, I advised patient to try to minimize the use of NSAIDs and use OTC Tylenol as needed for pain  Health maintenance -Given patient's neutropenia, he is at increased risk of infection -I discussed with the patient regarding importance of influenza vaccination; however patient has declined flu vaccine today  Orders Placed This Encounter  Procedures  . Biopsy bone marrow    Bone marrow aspiration and biopsy for pancytopenia    Standing Status:   Future    Standing Expiration Date:   06/08/2019  . CT BIOPSY    Standing Status:   Future    Standing Expiration Date:   06/07/2019    Order Specific Question:   Lab orders requested (DO NOT place separate lab orders, these will be automatically ordered during procedure specimen collection):    Answer:   Other    Order Specific Question:   Reason for Exam (SYMPTOM  OR DIAGNOSIS REQUIRED)    Answer:   Worsening pancytopenia, needing bone marrow aspiration and biopsy    Order Specific Question:   Preferred imaging location?    Answer:   [facility]    Order Specific Question:   Radiology Contrast Protocol - do NOT remove file path    Answer:   \\[email or web address]  . CBC with Differential ([place] Only)    Standing Status:   Future    Standing Expiration Date:   07/12/2019  . CMP ([place] only)    Standing Status:   Future    Standing Expiration Date:   07/12/2019    All questions were answered. The  patient knows to call the clinic with any problems, questions or concerns. No barriers to learning was detected.  A total of more than 25 minutes were spent face-to-face with the patient during this encounter and over half of that time was spent on counseling and coordination of care as outlined above.    Return to clinic in 3 weeks for labs and follow-up of bone marrow biopsy results.  Tish Men, MD 06/07/2018 1:21 PM  CHIEF COMPLAINT: "I am here for my labs"  INTERVAL HISTORY: [name] returns to clinic for follow-up of persistent leukopenia with neutropenia.  He reports that he has been using daily Goody powder for joint pain due to rheumatoid arthritis since his Enbrel was held 4 weeks ago.  He denies any recent infection, abnormal bleeding/bruising, fever, chill, weight loss, night sweats, lymphadenopathy, chest pain, or dyspnea.  REVIEW OF SYSTEMS:   Constitutional: ( - ) fevers, ( - )  chills , ( - ) night sweats Eyes: ( - ) blurriness of vision, ( - ) double vision, ( - ) watery eyes Ears, nose, mouth, throat, and face: ( - ) mucositis, ( - ) sore throat Respiratory: ( - ) cough, ( - ) dyspnea, ( - ) wheezes Cardiovascular: ( - ) palpitation, ( - ) chest discomfort, ( - ) lower extremity swelling Gastrointestinal:  ( - ) nausea, ( - ) heartburn, ( - ) change in bowel habits Skin: ( - ) abnormal skin rashes Lymphatics: ( - ) new lymphadenopathy, ( - ) easy bruising Neurological: ( - ) numbness, ( - ) tingling, ( - ) new weaknesses Behavioral/Psych: ( - ) mood change, ( - ) new changes  All other systems were reviewed with the patient and are negative.  I have reviewed the past medical history, past surgical history, social history and family history with the patient and they are unchanged from previous note.  ALLERGIES:  has No Known Allergies.  MEDICATIONS:  Current Outpatient Medications  Medication Sig Dispense Refill  . ENBREL SURECLICK 50 MG/ML injection   1   No current facility-administered medications for this visit.     PHYSICAL EXAMINATION: ECOG PERFORMANCE STATUS: 1 - Symptomatic but completely ambulatory  Today's Vitals   06/07/18 1049  BP: (!) 141/87  Pulse: 80  Resp: 18  Temp: 98.2 F (36.8 C)  TempSrc: Oral  SpO2: 100%  Weight: 217 lb (98.4  kg)   Body mass index is 32.05 kg/m.  Filed Weights   06/07/18 1049  Weight: 217 lb (98.4 kg)    GENERAL: alert, no distress and comfortable SKIN: skin color, texture, turgor are normal, no rashes or significant lesions EYES: conjunctiva are pink and non-injected, sclera clear OROPHARYNX: no exudate, no erythema; lips, buccal mucosa, and tongue normal  NECK: supple, non-tender LYMPH:  no palpable lymphadenopathy in the cervical or axillary  LUNGS: clear to auscultation and percussion with normal breathing effort HEART: regular rate & rhythm and no murmurs and no lower extremity edema ABDOMEN: soft, non-tender, non-distended, normal bowel sounds Musculoskeletal: no cyanosis of digits and no clubbing  PSYCH: alert & oriented x 3, fluent speech NEURO: no focal motor/sensory deficits  LABORATORY DATA:  I have reviewed the data as listed    Component Value Date/Time   NA 136 06/07/2018 1028   K 4.6 06/07/2018 1028   CL 103 06/07/2018 1028   CO2 25 06/07/2018 1028   GLUCOSE 101 (H) 06/07/2018 1028   BUN 8 06/07/2018 1028   CREATININE  0.82 06/07/2018 1028   CREATININE 0.83 05/07/2018 0832   CALCIUM 8.5 (L) 06/07/2018 1028   PROT 7.3 06/07/2018 1028   ALBUMIN 3.5 06/07/2018 1028   AST 27 06/07/2018 1028   ALT 26 06/07/2018 1028   ALKPHOS 65 06/07/2018 1028   BILITOT 1.1 06/07/2018 1028   GFRNONAA >60 06/07/2018 1028   GFRNONAA 98 05/07/2018 0832   GFRAA >60 06/07/2018 1028   GFRAA 113 05/07/2018 0832    No results found for: SPEP, UPEP  Lab Results  Component Value Date   WBC 2.1 (L) 06/07/2018   NEUTROABS 0.5 (L) 06/07/2018   HGB 11.8 (L) 06/07/2018   HCT 35.6 (L) 06/07/2018   MCV 95.4 06/07/2018   PLT 142 (L) 06/07/2018      Chemistry      Component Value Date/Time   NA 136 06/07/2018 1028   K 4.6 06/07/2018 1028   CL 103 06/07/2018 1028   CO2 25 06/07/2018 1028   BUN 8 06/07/2018 1028   CREATININE 0.82 06/07/2018 1028   CREATININE 0.83 05/07/2018 0832       Component Value Date/Time   CALCIUM 8.5 (L) 06/07/2018 1028   ALKPHOS 65 06/07/2018 1028   AST 27 06/07/2018 1028   ALT 26 06/07/2018 1028   BILITOT 1.1 06/07/2018 1028

## 2018-06-07 NOTE — Telephone Encounter (Signed)
Called pt s/w Daughter Florentina Jenny - discussed pt has BMBX  Scheduled for Thursday 10/24 arrival at [place] at [facility] Radiology Dept. Pt should be NPO after midnight 10/23, pt will need a driver to take home after procedure. Florentina Jenny verbalized understanding, no further concerns.

## 2018-06-12 ENCOUNTER — Other Ambulatory Visit: Payer: Self-pay | Admitting: Student

## 2018-06-13 ENCOUNTER — Other Ambulatory Visit: Payer: Self-pay

## 2018-06-13 ENCOUNTER — Ambulatory Visit (HOSPITAL_COMMUNITY)
Admission: RE | Admit: 2018-06-13 | Discharge: 2018-06-13 | Disposition: A | Discharge status: Home / Self Care | Payer: BLUE CROSS/BLUE SHIELD | Source: Ambulatory Visit | Attending: Hematology | Admitting: Hematology

## 2018-06-13 ENCOUNTER — Encounter (HOSPITAL_COMMUNITY): Payer: Self-pay

## 2018-06-13 DIAGNOSIS — D61818 Other pancytopenia: Secondary | ICD-10-CM | POA: Diagnosis present

## 2018-06-13 DIAGNOSIS — Z79899 Other long term (current) drug therapy: Secondary | ICD-10-CM | POA: Insufficient documentation

## 2018-06-13 DIAGNOSIS — K760 Fatty (change of) liver, not elsewhere classified: Secondary | ICD-10-CM | POA: Insufficient documentation

## 2018-06-13 DIAGNOSIS — E119 Type 2 diabetes mellitus without complications: Secondary | ICD-10-CM | POA: Diagnosis not present

## 2018-06-13 DIAGNOSIS — J45909 Unspecified asthma, uncomplicated: Secondary | ICD-10-CM | POA: Insufficient documentation

## 2018-06-13 DIAGNOSIS — R161 Splenomegaly, not elsewhere classified: Secondary | ICD-10-CM | POA: Diagnosis not present

## 2018-06-13 DIAGNOSIS — I110 Hypertensive heart disease with heart failure: Secondary | ICD-10-CM | POA: Insufficient documentation

## 2018-06-13 DIAGNOSIS — D571 Sickle-cell disease without crisis: Secondary | ICD-10-CM | POA: Diagnosis not present

## 2018-06-13 DIAGNOSIS — I509 Heart failure, unspecified: Secondary | ICD-10-CM | POA: Diagnosis not present

## 2018-06-13 DIAGNOSIS — M069 Rheumatoid arthritis, unspecified: Secondary | ICD-10-CM | POA: Diagnosis not present

## 2018-06-13 DIAGNOSIS — C9000 Multiple myeloma not having achieved remission: Secondary | ICD-10-CM | POA: Insufficient documentation

## 2018-06-13 DIAGNOSIS — I700 Atherosclerosis of aorta: Secondary | ICD-10-CM | POA: Insufficient documentation

## 2018-06-13 LAB — CBC WITH DIFFERENTIAL/PLATELET
Abs Immature Granulocytes: 0 10*3/uL (ref 0.00–0.07)
Basophils Absolute: 0 10*3/uL (ref 0.0–0.1)
Basophils Relative: 1 %
Eosinophils Absolute: 0.1 10*3/uL (ref 0.0–0.5)
Eosinophils Relative: 4 %
HCT: 35.8 % — ABNORMAL LOW (ref 39.0–52.0)
Hemoglobin: 11.9 g/dL — ABNORMAL LOW (ref 13.0–17.0)
Immature Granulocytes: 0 %
Lymphocytes Relative: 39 %
Lymphs Abs: 0.8 10*3/uL (ref 0.7–4.0)
MCH: 32.4 pg (ref 26.0–34.0)
MCHC: 33.2 g/dL (ref 30.0–36.0)
MCV: 97.5 fL (ref 80.0–100.0)
Monocytes Absolute: 0.6 10*3/uL (ref 0.1–1.0)
Monocytes Relative: 30 %
Neutro Abs: 0.6 10*3/uL — ABNORMAL LOW (ref 1.7–7.7)
Neutrophils Relative %: 26 %
Platelets: 159 10*3/uL (ref 150–400)
RBC: 3.67 MIL/uL — ABNORMAL LOW (ref 4.22–5.81)
RDW: 13.9 % (ref 11.5–15.5)
WBC: 2.1 10*3/uL — ABNORMAL LOW (ref 4.0–10.5)
nRBC: 0 % (ref 0.0–0.2)

## 2018-06-13 MED ORDER — SODIUM CHLORIDE 0.9 % IV SOLN
INTRAVENOUS | Status: DC
  Administered 2018-06-13: 08:00:00 via INTRAVENOUS

## 2018-06-13 MED ORDER — NALOXONE HCL 0.4 MG/ML IJ SOLN
INTRAMUSCULAR | Status: AC
  Filled 2018-06-13: qty 1

## 2018-06-13 MED ORDER — MIDAZOLAM HCL 2 MG/2ML IJ SOLN
INTRAMUSCULAR | Status: AC | PRN
  Administered 2018-06-13 (×2): 1 mg via INTRAVENOUS

## 2018-06-13 MED ORDER — MIDAZOLAM HCL 2 MG/2ML IJ SOLN
INTRAMUSCULAR | Status: AC
  Filled 2018-06-13: qty 4

## 2018-06-13 MED ORDER — FENTANYL CITRATE (PF) 100 MCG/2ML IJ SOLN
INTRAMUSCULAR | Status: AC | PRN
Start: 2018-06-13 — End: 2018-06-13
  Administered 2018-06-13 (×2): 50 ug via INTRAVENOUS

## 2018-06-13 MED ORDER — FENTANYL CITRATE (PF) 100 MCG/2ML IJ SOLN
INTRAMUSCULAR | Status: AC
Start: 2018-06-13 — End: 2018-06-13
  Filled 2018-06-13: qty 2

## 2018-06-13 MED ORDER — FLUMAZENIL 0.5 MG/5ML IV SOLN
INTRAVENOUS | Status: AC
  Filled 2018-06-13: qty 5

## 2018-06-13 MED ORDER — LIDOCAINE HCL (PF) 1 % IJ SOLN
INTRAMUSCULAR | Status: AC | PRN
  Administered 2018-06-13: 10 mL via SUBCUTANEOUS

## 2018-06-13 NOTE — H&P (Signed)
Chief Complaint: Patient was seen in consultation today for pancytopenia.  Referring Physician(s): Zhao,Yan  Supervising Physician: Jacqulynn Cadet  Patient Status: [facility] - Out-pt  History of Present Illness: Daniel Griffin is a 57 y.o. male with a past medical history of hypertension, HF, collagen vascular disease, asthma, renal insufficiency, sickle cell anemia, multiple myeloma, diabetes mellitus, OA, and rheumatoid arthritis. He has been on chronic immunosuppression with Enbrel for his rheumatoid arthritis. Starting in 02/2018, his lab studies have showed new onset leukopenia with neutropenia. His Enbrel was then held for 4 weeks by his rheumatologist, Dr. Estanislado Pandy. His leukopenia/neutropenia has continued to worsen. He went to Dr. Maylon Peppers for management.  IR requested by Dr. Maylon Peppers for possible image-guided bone marrow biopsy/aspiration to evaluate pancytopenia. Patient awake and alert laying in bed with no complaints at this time. Accompanied by 2 family members at bedside. Denies fever, chills, chest pain, dyspnea, abdominal pain, headache, or dizziness.   Past Medical History:  Diagnosis Date  . Arthritis     Past Surgical History:  Procedure Laterality Date  . APPENDECTOMY      Allergies: Patient has no known allergies.  Medications: Prior to Admission medications   Medication Sig Start Date End Date Taking? Authorizing Provider  ENBREL SURECLICK 50 MG/ML injection  04/29/18   [provider]     Family History  Problem Relation Age of Onset  . Alzheimer's disease Father   . Healthy Son   . Healthy Daughter   . Rheum arthritis Daughter   . Healthy Daughter     Social History   Socioeconomic History  . Marital status: Married    Spouse name: Not on file  . Number of children: Not on file  . Years of education: Not on file  . Highest education level: Not on file  Occupational History  . Not on file  Social Needs  . Financial resource strain:  Not on file  . Food insecurity:    Worry: Not on file    Inability: Not on file  . Transportation needs:    Medical: Not on file    Non-medical: Not on file  Tobacco Use  . Smoking status: Never Smoker  . Smokeless tobacco: Current User    Types: Chew, Snuff  Substance and Sexual Activity  . Alcohol use: Yes  . Drug use: No  . Sexual activity: Not on file  Lifestyle  . Physical activity:    Days per week: Not on file    Minutes per session: Not on file  . Stress: Not on file  Relationships  . Social connections:    Talks on phone: Not on file    Gets together: Not on file    Attends religious service: Not on file    Active member of club or organization: Not on file    Attends meetings of clubs or organizations: Not on file    Relationship status: Not on file  Other Topics Concern  . Not on file  Social History Narrative  . Not on file     Review of Systems: A 12 point ROS discussed and pertinent positives are indicated in the HPI above.  All other systems are negative.  Review of Systems  Constitutional: Negative for chills and fever.  Respiratory: Negative for shortness of breath and wheezing.   Cardiovascular: Negative for chest pain and palpitations.  Gastrointestinal: Negative for abdominal pain.  Neurological: Negative for dizziness and headaches.  Psychiatric/Behavioral: Negative for behavioral problems and confusion.  Vital Signs: BP (!) 143/80 (BP Location: Right Arm)   Pulse 64   Temp 98.4 F (36.9 C) (Oral)   Resp 18   SpO2 94%   Physical Exam  Constitutional: He is oriented to person, place, and time. He appears well-developed and well-nourished. No distress.  Cardiovascular: Normal rate, regular rhythm and normal heart sounds.  No murmur heard. Pulmonary/Chest: Effort normal and breath sounds normal. No respiratory distress. He has no wheezes.  Neurological: He is alert and oriented to person, place, and time.  Skin: Skin is warm and dry.    Psychiatric: He has a normal mood and affect. His behavior is normal. Judgment and thought content normal.  Nursing note and vitals reviewed.    MD Evaluation Airway: WNL Heart: WNL Abdomen: WNL Chest/ Lungs: WNL ASA  Classification: 2 Mallampati/Airway Score: Two   Imaging: Ct Chest W Contrast  Result Date: 06/07/2018 CLINICAL DATA:  Neutropenia, rheumatoid arthritis. EXAM: CT CHEST, ABDOMEN, AND PELVIS WITH CONTRAST TECHNIQUE: Multidetector CT imaging of the chest, abdomen and pelvis was performed following the standard protocol during bolus administration of intravenous contrast. CONTRAST:  175m ISOVUE-300 IOPAMIDOL (ISOVUE-300) INJECTION 61% COMPARISON:  None. FINDINGS: CT CHEST FINDINGS Cardiovascular: Vascular structures are unremarkable. Heart is at the upper limits of normal in size. No pericardial effusion. Mediastinum/Nodes: No pathologically enlarged mediastinal, hilar or axillary lymph nodes. Esophagus is grossly unremarkable. Lungs/Pleura: Mild dependent atelectasis bilaterally. Peribronchovascular nodularity in the lingula may be infectious or post infectious in etiology. No pleural fluid. Airway is unremarkable. Musculoskeletal: No worrisome lytic or sclerotic lesions. Degenerative changes in the spine. CT ABDOMEN PELVIS FINDINGS Hepatobiliary: Liver is decreased in attenuation diffusely. Liver and gallbladder are otherwise unremarkable. No biliary ductal dilatation. Pancreas: Negative. Spleen: Measures 14.3 cm. Adrenals/Urinary Tract: 1.6 cm fat density lesion in the right adrenal gland. Left adrenal gland is unremarkable. Subcentimeter low-attenuation lesions in the kidneys are too small to characterize but statistically, cysts are most likely. Ureters are decompressed. Bladder is grossly unremarkable. Stomach/Bowel: Stomach, small bowel and colon are unremarkable. Appendix is not readily visualized. Vascular/Lymphatic: Trace atherosclerotic calcification of the aorta. No  pathologically enlarged lymph nodes. No free fluid. Reproductive: Prostate is visualized. Other: No free fluid.  Mesenteries and peritoneum are unremarkable. Musculoskeletal: Degenerative changes in the spine. No worrisome lytic or sclerotic lesions. IMPRESSION: 1. Hepatic steatosis. 2. Mild splenomegaly. 3. Right adrenal myelolipoma. Electronically Signed   By: MLorin PicketM.D.   On: 06/07/2018 13:11   Ct Abdomen Pelvis W Contrast  Result Date: 06/07/2018 CLINICAL DATA:  Neutropenia, rheumatoid arthritis. EXAM: CT CHEST, ABDOMEN, AND PELVIS WITH CONTRAST TECHNIQUE: Multidetector CT imaging of the chest, abdomen and pelvis was performed following the standard protocol during bolus administration of intravenous contrast. CONTRAST:  1047mISOVUE-300 IOPAMIDOL (ISOVUE-300) INJECTION 61% COMPARISON:  None. FINDINGS: CT CHEST FINDINGS Cardiovascular: Vascular structures are unremarkable. Heart is at the upper limits of normal in size. No pericardial effusion. Mediastinum/Nodes: No pathologically enlarged mediastinal, hilar or axillary lymph nodes. Esophagus is grossly unremarkable. Lungs/Pleura: Mild dependent atelectasis bilaterally. Peribronchovascular nodularity in the lingula may be infectious or post infectious in etiology. No pleural fluid. Airway is unremarkable. Musculoskeletal: No worrisome lytic or sclerotic lesions. Degenerative changes in the spine. CT ABDOMEN PELVIS FINDINGS Hepatobiliary: Liver is decreased in attenuation diffusely. Liver and gallbladder are otherwise unremarkable. No biliary ductal dilatation. Pancreas: Negative. Spleen: Measures 14.3 cm. Adrenals/Urinary Tract: 1.6 cm fat density lesion in the right adrenal gland. Left adrenal gland is unremarkable. Subcentimeter low-attenuation lesions in  the kidneys are too small to characterize but statistically, cysts are most likely. Ureters are decompressed. Bladder is grossly unremarkable. Stomach/Bowel: Stomach, small bowel and colon are  unremarkable. Appendix is not readily visualized. Vascular/Lymphatic: Trace atherosclerotic calcification of the aorta. No pathologically enlarged lymph nodes. No free fluid. Reproductive: Prostate is visualized. Other: No free fluid.  Mesenteries and peritoneum are unremarkable. Musculoskeletal: Degenerative changes in the spine. No worrisome lytic or sclerotic lesions. IMPRESSION: 1. Hepatic steatosis. 2. Mild splenomegaly. 3. Right adrenal myelolipoma. Electronically Signed   By: Lorin Picket M.D.   On: 06/07/2018 13:11    Labs:  CBC: Recent Labs    05/07/18 0832 05/24/18 0802 06/07/18 1028 06/13/18 0728  WBC 2.5* 2.5* 2.1* 2.1*  HGB 13.0* 13.0 11.8* 11.9*  HCT 37.8* 37.5* 35.6* 35.8*  PLT 188 154 142* 159    COAGS: No results for input(s): INR, APTT in the last 8760 hours.  BMP: Recent Labs    04/08/18 0848 05/07/18 0832 05/24/18 0802 06/07/18 1028  NA 140 139 141 136  K 4.4 4.2 4.4 4.6  CL 105 102 108 103  CO2 '27 28 22 25  '$ GLUCOSE 119* 118* 108* 101*  BUN '9 9 13 8  '$ CALCIUM 8.4* 9.0 9.1 8.5*  CREATININE 0.72 0.83 0.76 0.82  GFRNONAA 104 98 >60 >60  GFRAA 120 113 >60 >60    LIVER FUNCTION TESTS: Recent Labs    04/08/18 0848 05/07/18 0832 05/24/18 0802 06/07/18 1028  BILITOT 0.9 1.1 1.1 1.1  AST '21 23 28 27  '$ ALT '23 24 25 26  '$ ALKPHOS  --   --  59 65  PROT 7.1 7.6 7.7 7.3  ALBUMIN  --   --  3.7 3.5    TUMOR MARKERS: No results for input(s): AFPTM, CEA, CA199, CHROMGRNA in the last 8760 hours.  Assessment and Plan:  Pancytopenia. Plan for image-guided bone marrow biopsy/aspiration today with Dr. Laurence Ferrari. Patient is NPO. Afebrile. He does not take blood thinners.  Risks and benefits discussed with the patient including, but not limited to bleeding, infection, damage to adjacent structures or low yield requiring additional tests. All of the patient's questions were answered, patient is agreeable to proceed. Consent signed and in chart.   Thank  you for this interesting consult.  I greatly enjoyed meeting Daniel Griffin and look forward to participating in their care.  A copy of this report was sent to the requesting provider on this date.  Electronically Signed: Earley Abide, PA-C 06/13/2018, 8:26 AM   I spent a total of 30 Minutes in face to face in clinical consultation, greater than 50% of which was counseling/coordinating care for pancytopenia.

## 2018-06-13 NOTE — Discharge Instructions (Signed)
Bone Marrow Aspiration and Bone Marrow Biopsy, Adult, Care After °This sheet gives you information about how to care for yourself after your procedure. Your health care provider may also give you more specific instructions. If you have problems or questions, contact your health care provider. °What can I expect after the procedure? °After the procedure, it is common to have: °· Mild pain and tenderness. °· Swelling. °· Bruising. ° °Follow these instructions at home: °· Take over-the-counter or prescription medicines only as told by your health care provider. °· Do not take baths, swim, or use a hot tub until your health care provider approves. Ask if you can take a shower or have a sponge bath. °· Follow instructions from your health care provider about how to take care of the puncture site. Make sure you: °? Wash your hands with soap and water before you change your bandage (dressing). If soap and water are not available, use hand sanitizer. °? Change your dressing as told by your health care provider. °· Check your puncture site every day for signs of infection. Check for: °? More redness, swelling, or pain. °? More fluid or blood. °? Warmth. °? Pus or a bad smell. °· Return to your normal activities as told by your health care provider. Ask your health care provider what activities are safe for you. °· Do not drive for 24 hours if you were given a medicine to help you relax (sedative). °· Keep all follow-up visits as told by your health care provider. This is important. °Contact a health care provider if: °· You have more redness, swelling, or pain around the puncture site. °· You have more fluid or blood coming from the puncture site. °· Your puncture site feels warm to the touch. °· You have pus or a bad smell coming from the puncture site. °· You have a fever. °· Your pain is not controlled with medicine. °This information is not intended to replace advice given to you by your health care provider. Make sure  you discuss any questions you have with your health care provider. °Document Released: 02/24/2005 Document Revised: 02/25/2016 Document Reviewed: 01/19/2016 °Elsevier Interactive Patient Education © 2018 [employer] °Moderate Conscious Sedation, Adult, Care After °These instructions provide you with information about caring for yourself after your procedure. Your health care provider may also give you more specific instructions. Your treatment has been planned according to current medical practices, but problems sometimes occur. Call your health care provider if you have any problems or questions after your procedure. °What can I expect after the procedure? °After your procedure, it is common: °· To feel sleepy for several hours. °· To feel clumsy and have poor balance for several hours. °· To have poor judgment for several hours. °· To vomit if you eat too soon. ° °Follow these instructions at home: °For at least 24 hours after the procedure: ° °· Do not: °? Participate in activities where you could fall or become injured. °? Drive. °? Use heavy machinery. °? Drink alcohol. °? Take sleeping pills or medicines that cause drowsiness. °? Make important decisions or sign legal documents. °? Take care of children on your own. °· Rest. °Eating and drinking °· Follow the diet recommended by your health care provider. °· If you vomit: °? Drink water, juice, or soup when you can drink without vomiting. °? Make sure you have little or no nausea before eating solid foods. °General instructions °· Have a responsible adult stay with you until you are   awake and alert.  Take over-the-counter and prescription medicines only as told by your health care provider.  If you smoke, do not smoke without supervision.  Keep all follow-up visits as told by your health care provider. This is important. Contact a health care provider if:  You keep feeling nauseous or you keep vomiting.  You feel light-headed.  You develop a  rash.  You have a fever. Get help right away if:  You have trouble breathing. This information is not intended to replace advice given to you by your health care provider. Make sure you discuss any questions you have with your health care provider. Document Released: 05/28/2013 Document Revised: 01/10/2016 Document Reviewed: 11/27/2015 Elsevier Interactive Patient Education  [employer].

## 2018-06-13 NOTE — Procedures (Signed)
Interventional Radiology Procedure Note  Procedure: CT guided aspirate and core biopsy of right iliac bone Complications: None Recommendations: - Bedrest supine x 1 hrs - Hydrocodone PRN  Pain - Follow biopsy results  Signed,  Tayanna Talford K. Bransen Fassnacht, MD   

## 2018-06-21 ENCOUNTER — Encounter (HOSPITAL_COMMUNITY): Payer: Self-pay | Admitting: Hematology

## 2018-06-24 NOTE — Telephone Encounter (Signed)
Patient repeat CBC in 1 month.  If WBC count recovers then we can start him on Orencia.  Please advise patient to get repeat CBC in 1 month.

## 2018-06-24 NOTE — Telephone Encounter (Signed)
Left voicemail.  Orders are in place.

## 2018-06-25 ENCOUNTER — Telehealth: Payer: Self-pay | Admitting: Hematology

## 2018-06-25 NOTE — Telephone Encounter (Signed)
lmom to inform patient of resch lab/md appt 11/12 to 11/14 at 0945 per MD out of office at a meeting

## 2018-06-27 NOTE — Telephone Encounter (Signed)
Attempted to contact patient again and no answer.  Left voicemail informing patient that we need him to come in for lab work the first week of December prior to starting any new medications.

## 2018-07-02 ENCOUNTER — Inpatient Hospital Stay: Payer: BLUE CROSS/BLUE SHIELD

## 2018-07-02 ENCOUNTER — Ambulatory Visit: Payer: BLUE CROSS/BLUE SHIELD | Admitting: Hematology

## 2018-07-04 ENCOUNTER — Telehealth: Payer: Self-pay

## 2018-07-04 ENCOUNTER — Telehealth: Payer: Self-pay | Admitting: Hematology

## 2018-07-04 ENCOUNTER — Inpatient Hospital Stay (HOSPITAL_BASED_OUTPATIENT_CLINIC_OR_DEPARTMENT_OTHER): Payer: BLUE CROSS/BLUE SHIELD | Admitting: Hematology

## 2018-07-04 ENCOUNTER — Inpatient Hospital Stay: Payer: BLUE CROSS/BLUE SHIELD | Attending: Hematology

## 2018-07-04 ENCOUNTER — Telehealth: Payer: Self-pay | Admitting: Rheumatology

## 2018-07-04 ENCOUNTER — Encounter: Payer: Self-pay | Admitting: Hematology

## 2018-07-04 ENCOUNTER — Other Ambulatory Visit: Payer: Self-pay

## 2018-07-04 VITALS — BP 138/81 | HR 69 | Temp 98.0°F | Resp 16 | Wt 219.0 lb

## 2018-07-04 DIAGNOSIS — M069 Rheumatoid arthritis, unspecified: Secondary | ICD-10-CM

## 2018-07-04 DIAGNOSIS — Z719 Counseling, unspecified: Secondary | ICD-10-CM

## 2018-07-04 DIAGNOSIS — D61818 Other pancytopenia: Secondary | ICD-10-CM

## 2018-07-04 DIAGNOSIS — R7401 Elevation of levels of liver transaminase levels: Secondary | ICD-10-CM | POA: Insufficient documentation

## 2018-07-04 DIAGNOSIS — M0579 Rheumatoid arthritis with rheumatoid factor of multiple sites without organ or systems involvement: Secondary | ICD-10-CM

## 2018-07-04 DIAGNOSIS — D709 Neutropenia, unspecified: Secondary | ICD-10-CM

## 2018-07-04 DIAGNOSIS — Z23 Encounter for immunization: Secondary | ICD-10-CM | POA: Diagnosis not present

## 2018-07-04 DIAGNOSIS — R740 Nonspecific elevation of levels of transaminase and lactic acid dehydrogenase [LDH]: Secondary | ICD-10-CM

## 2018-07-04 DIAGNOSIS — Z7982 Long term (current) use of aspirin: Secondary | ICD-10-CM | POA: Diagnosis not present

## 2018-07-04 DIAGNOSIS — K760 Fatty (change of) liver, not elsewhere classified: Secondary | ICD-10-CM | POA: Insufficient documentation

## 2018-07-04 LAB — CBC WITH DIFFERENTIAL (CANCER CENTER ONLY)
Abs Immature Granulocytes: 0.01 10*3/uL (ref 0.00–0.07)
Basophils Absolute: 0 10*3/uL (ref 0.0–0.1)
Basophils Relative: 1 %
Eosinophils Absolute: 0 10*3/uL (ref 0.0–0.5)
Eosinophils Relative: 1 %
HCT: 36.5 % — ABNORMAL LOW (ref 39.0–52.0)
Hemoglobin: 12.2 g/dL — ABNORMAL LOW (ref 13.0–17.0)
Immature Granulocytes: 0 %
Lymphocytes Relative: 49 %
Lymphs Abs: 1.4 10*3/uL (ref 0.7–4.0)
MCH: 32 pg (ref 26.0–34.0)
MCHC: 33.4 g/dL (ref 30.0–36.0)
MCV: 95.8 fL (ref 80.0–100.0)
Monocytes Absolute: 0.8 10*3/uL (ref 0.1–1.0)
Monocytes Relative: 28 %
Neutro Abs: 0.6 10*3/uL — ABNORMAL LOW (ref 1.7–7.7)
Neutrophils Relative %: 21 %
Platelet Count: 150 10*3/uL (ref 150–400)
RBC: 3.81 MIL/uL — ABNORMAL LOW (ref 4.22–5.81)
RDW: 13.1 % (ref 11.5–15.5)
WBC Count: 3 10*3/uL — ABNORMAL LOW (ref 4.0–10.5)
nRBC: 0 % (ref 0.0–0.2)

## 2018-07-04 LAB — CMP (CANCER CENTER ONLY)
ALT: 53 U/L — ABNORMAL HIGH (ref 10–47)
AST: 53 U/L — ABNORMAL HIGH (ref 11–38)
Albumin: 3.3 g/dL — ABNORMAL LOW (ref 3.5–5.0)
Alkaline Phosphatase: 59 U/L (ref 26–84)
Anion gap: 7 (ref 5–15)
BUN: 7 mg/dL (ref 7–22)
CO2: 27 mmol/L (ref 18–33)
Calcium: 8.7 mg/dL (ref 8.0–10.3)
Chloride: 107 mmol/L (ref 98–108)
Creatinine: 0.6 mg/dL (ref 0.60–1.20)
Glucose, Bld: 105 mg/dL (ref 73–118)
Potassium: 4.5 mmol/L (ref 3.3–4.7)
Sodium: 141 mmol/L (ref 128–145)
Total Bilirubin: 1 mg/dL (ref 0.2–1.6)
Total Protein: 7.2 g/dL (ref 6.4–8.1)

## 2018-07-04 MED ORDER — INFLUENZA VAC SPLIT QUAD 0.5 ML IM SUSY
0.5000 mL | PREFILLED_SYRINGE | Freq: Once | INTRAMUSCULAR | Status: AC
  Administered 2018-07-04: 0.5 mL via INTRAMUSCULAR

## 2018-07-04 NOTE — Telephone Encounter (Signed)
Patient's sister and daughter called asking when patient could start orencia. The prior authorization has been approved. Patient had labs drawn today and WBC count was 3.0 (previously 2.1). Per Dr. Estanislado Pandy, okay to schedule nurse visit to start [place]. We will start patient with a sample here in the office. Patient is scheduled for 07/09/2018 at 10:00am.

## 2018-07-04 NOTE — Telephone Encounter (Signed)
Appts scheduled patient has copy of schedule per 11/14 los

## 2018-07-04 NOTE — Telephone Encounter (Signed)
Daughter calling to let you know patient has had bloodwork this morning with Hematologist. Dr. Ezekiel Ina (whom  is a part of [facility]), and results are in the system. Please advise if patient needs further labs other than ones done today.

## 2018-07-04 NOTE — Telephone Encounter (Signed)
Okay to start on Orencia as planned.  We will check labs in 2 weeks after his Orencia dose, and then will monitor every month.

## 2018-07-04 NOTE — Telephone Encounter (Signed)
Called patient's sister Marlowe Kays (who is on [facility]) and advised her that we do not need any additional labs right now. The labs drawn by the hematologist are adequate. She verbalized understanding and will let patient know.

## 2018-07-04 NOTE — Progress Notes (Signed)
[place] OFFICE PROGRESS NOTE  Patient Care Team: Default, Provider, MD as PCP - General  HEME/ONC OVERVIEW: 1. 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 (drug-related, etc.), no myelodysplasia; normal karyotype and cytogenetics  ASSESSMENT & PLAN:  Pancytopenia -Patient has had extensive work-up for pancytopenia with predominant leukopenia and neutropenia, including blood work, CT, and bone marrow biopsy -Bone marrow biopsy in October 2019 showed mildly hypercellular marrow with myeloid hyperplasia, possibly reactive due to drug effects; there was no dysplasia to suggest MDS -WBC 3.0, slightly improving but ANC remains unchanged around 600 today -Patient denies any symptoms of infection -At this time, the leukopenia with neutropenia is most likely secondary to drug related, including TNF-alpha inhibitor and NSAIDs  -Ideally, patient should be taken off chronic immunosuppression until adequate count recovery; however, given the patient's debilitating rheumatoid arthritis, he may need resumption of chronic immunosuppression due to poor quality of life before WBC and ANC normalize -Given that his WBC is slowly improving, and he is not having any symptom of infection, there is no indication for prophylactic G-CSF right now  -However, if his neutropenia becomes worse after resumption of immunosuppression (especially if < 500), he may require G-CSF to reduce the risk of serious infections  -I encouraged patient to practice hand hygiene and  vaccinations as appropriate   Rheumatoid arthritis -Enbrel discontinued -He reports taking daily Aleve and PRN Goody powder for joint pain -I discussed with the patient that NSAIDs and TNF alpha inhibitors can cause neutropenia in the setting of rheumatoid arthritis; therefore, I advised patient to try to minimize the use of NSAIDs and use OTC Tylenol as needed for pain -As discussed above, if the patient requires resumption of chronic immunosuppression while he remains neutropenic, he may need G-CSF to reduce the risk infection  Mild transaminitis -LFT's mildly elevated today; Tbili normal  -Possibly due to NSAIDs vs. fatty liver disease -We will monitor it closely for now   Health maintenance -Given patient's neutropenia, he is at increased risk of infection -I spent some time counseling the the patient regarding importance of influenza vaccination, and he agreed to receive influenza vaccine today  Orders Placed This Encounter  Procedures  . CBC with Differential (Cancer Center Only)    Standing Status:   Future    Standing Expiration Date:   08/08/2019  . CMP ([facility] only)    Standing Status:   Future    Standing Expiration Date:   08/08/2019  . Save Smear ([facility])    Standing Status:   Future    Standing Expiration Date:   07/05/2019  . Lactate dehydrogenase    Standing Status:   Future    Standing Expiration Date:   08/08/2019   All questions were answered. The patient knows to call the clinic with any problems, questions or concerns. No barriers to learning was detected.  A total of more than 25 minutes were spent face-to-face with the patient during this encounter and over half of that time was spent on counseling and coordination of care as outlined above.   Return in 1 month for labs and clinic follow-up.  Tish Men, MD 07/04/2018 10:46 AM  CHIEF COMPLAINT: "My pain is pretty bad"  INTERVAL HISTORY: Daniel Griffin returns to clinic for follow-up of recent bone  marrow biopsy results.  He reports that he has been taking daily Aleve and as needed Goody powder (approximately 2 times per week) for diffuse joint pain secondary to rheumatoid arthritis.  He has not resumed any TNF alpha inhibitor.  He denies any fever, chill, night sweats, weight loss, lymphadenopathy, chest pain, dyspnea, cough, sputum production, dysuria, hematuria.  SUMMARY OF ONCOLOGIC HISTORY:  No history exists.    REVIEW OF SYSTEMS:   Constitutional: ( - ) fevers, ( - )  chills , ( - ) night sweats Eyes: ( - ) blurriness of vision, ( - ) double vision, ( - ) watery eyes Ears, nose, mouth, throat, and face: ( - ) mucositis, ( - ) sore throat Respiratory: ( - ) cough, ( - ) dyspnea, ( - ) wheezes Cardiovascular: ( - ) palpitation, ( - ) chest discomfort, ( - ) lower extremity swelling Gastrointestinal:  ( - ) nausea, ( - ) heartburn, ( - ) change in bowel habits Skin: ( - ) abnormal skin rashes Lymphatics: ( - ) new lymphadenopathy, ( - ) easy bruising Neurological: ( - ) numbness, ( - ) tingling, ( - ) new weaknesses Behavioral/Psych: ( - ) mood change, ( - ) new changes  All other systems were reviewed with the patient and are negative.  I have reviewed the past medical history, past surgical history, social history and family history with the patient and they are unchanged from previous note.  ALLERGIES:  has No Known Allergies.  MEDICATIONS:  Current Outpatient Medications  Medication Sig Dispense Refill  . Aspirin-Acetaminophen (GOODYS BODY PAIN PO) Take by mouth 3 times/day as needed-between meals & bedtime.    . naproxen sodium (ALEVE) 220 MG tablet Take 220 mg by mouth daily as needed.     No current facility-administered medications for this visit.     PHYSICAL EXAMINATION: ECOG PERFORMANCE STATUS: 1 - Symptomatic but completely ambulatory  Today's Vitals   07/04/18 1010  BP: 138/81  Pulse: 69  Resp: 16  Temp: 98 F (36.7 C)  TempSrc: Oral  SpO2: 98%   Weight: 219 lb (99.3 kg)  PainSc: 8    Body mass index is 32.34 kg/m.  Filed Weights   07/04/18 1010  Weight: 219 lb (99.3 kg)    GENERAL: alert, no distress and comfortable SKIN: skin color, texture, turgor are normal, no rashes or significant lesions EYES: conjunctiva are pink and non-injected, sclera clear OROPHARYNX: no exudate, no erythema; lips, buccal mucosa, and tongue normal  NECK: supple, non-tender LYMPH:  no palpable lymphadenopathy in the cervical or axillary  LUNGS: clear to auscultation and percussion with normal breathing effort HEART: regular rate & rhythm and no murmurs and no lower extremity edema ABDOMEN: soft, non-tender, non-distended, normal bowel sounds Musculoskeletal: no cyanosis of digits and no clubbing  PSYCH: alert & oriented x 3, fluent speech NEURO: no focal motor/sensory deficits  LABORATORY DATA:  I have reviewed the data as listed    Component Value Date/Time   NA 141 07/04/2018 0946   K 4.5 07/04/2018 0946   CL 107 07/04/2018 0946   CO2 27 07/04/2018 0946   GLUCOSE 105 07/04/2018 0946   BUN 7 07/04/2018 0946   CREATININE 0.60 07/04/2018 0946   CREATININE 0.83 05/07/2018 0832   CALCIUM 8.7 07/04/2018 0946   PROT 7.2 07/04/2018 0946   ALBUMIN 3.3 (L)  07/04/2018 0946   AST 53 (H) 07/04/2018 0946   ALT 53 (H) 07/04/2018 0946   ALKPHOS 59 07/04/2018 0946   BILITOT 1.0 07/04/2018 0946   GFRNONAA >60 06/07/2018 1028   GFRNONAA 98 05/07/2018 0832   GFRAA >60 06/07/2018 1028   GFRAA 113 05/07/2018 0832    No results found for: SPEP, UPEP  Lab Results  Component Value Date   WBC 3.0 (L) 07/04/2018   NEUTROABS 0.6 (L) 07/04/2018   HGB 12.2 (L) 07/04/2018   HCT 36.5 (L) 07/04/2018   MCV 95.8 07/04/2018   PLT 150 07/04/2018      Chemistry      Component Value Date/Time   NA 141 07/04/2018 0946   K 4.5 07/04/2018 0946   CL 107 07/04/2018 0946   CO2 27 07/04/2018 0946   BUN 7 07/04/2018 0946   CREATININE 0.60 07/04/2018 0946    CREATININE 0.83 05/07/2018 0832      Component Value Date/Time   CALCIUM 8.7 07/04/2018 0946   ALKPHOS 59 07/04/2018 0946   AST 53 (H) 07/04/2018 0946   ALT 53 (H) 07/04/2018 0946   BILITOT 1.0 07/04/2018 0946       RADIOGRAPHIC STUDIES: I have personally reviewed the radiological images as listed below and agreed with the findings in the report. Ct Chest W Contrast  Result Date: 06/07/2018 CLINICAL DATA:  Neutropenia, rheumatoid arthritis. EXAM: CT CHEST, ABDOMEN, AND PELVIS WITH CONTRAST TECHNIQUE: Multidetector CT imaging of the chest, abdomen and pelvis was performed following the standard protocol during bolus administration of intravenous contrast. CONTRAST:  160m ISOVUE-300 IOPAMIDOL (ISOVUE-300) INJECTION 61% COMPARISON:  None. FINDINGS: CT CHEST FINDINGS Cardiovascular: Vascular structures are unremarkable. Heart is at the upper limits of normal in size. No pericardial effusion. Mediastinum/Nodes: No pathologically enlarged mediastinal, hilar or axillary lymph nodes. Esophagus is grossly unremarkable. Lungs/Pleura: Mild dependent atelectasis bilaterally. Peribronchovascular nodularity in the lingula may be infectious or post infectious in etiology. No pleural fluid. Airway is unremarkable. Musculoskeletal: No worrisome lytic or sclerotic lesions. Degenerative changes in the spine. CT ABDOMEN PELVIS FINDINGS Hepatobiliary: Liver is decreased in attenuation diffusely. Liver and gallbladder are otherwise unremarkable. No biliary ductal dilatation. Pancreas: Negative. Spleen: Measures 14.3 cm. Adrenals/Urinary Tract: 1.6 cm fat density lesion in the right adrenal gland. Left adrenal gland is unremarkable. Subcentimeter low-attenuation lesions in the kidneys are too small to characterize but statistically, cysts are most likely. Ureters are decompressed. Bladder is grossly unremarkable. Stomach/Bowel: Stomach, small bowel and colon are unremarkable. Appendix is not readily visualized.  Vascular/Lymphatic: Trace atherosclerotic calcification of the aorta. No pathologically enlarged lymph nodes. No free fluid. Reproductive: Prostate is visualized. Other: No free fluid.  Mesenteries and peritoneum are unremarkable. Musculoskeletal: Degenerative changes in the spine. No worrisome lytic or sclerotic lesions. IMPRESSION: 1. Hepatic steatosis. 2. Mild splenomegaly. 3. Right adrenal myelolipoma. Electronically Signed   By: MLorin PicketM.D.   On: 06/07/2018 13:11   Ct Abdomen Pelvis W Contrast  Result Date: 06/07/2018 CLINICAL DATA:  Neutropenia, rheumatoid arthritis. EXAM: CT CHEST, ABDOMEN, AND PELVIS WITH CONTRAST TECHNIQUE: Multidetector CT imaging of the chest, abdomen and pelvis was performed following the standard protocol during bolus administration of intravenous contrast. CONTRAST:  105mISOVUE-300 IOPAMIDOL (ISOVUE-300) INJECTION 61% COMPARISON:  None. FINDINGS: CT CHEST FINDINGS Cardiovascular: Vascular structures are unremarkable. Heart is at the upper limits of normal in size. No pericardial effusion. Mediastinum/Nodes: No pathologically enlarged mediastinal, hilar or axillary lymph nodes. Esophagus is grossly unremarkable. Lungs/Pleura: Mild dependent atelectasis bilaterally. Peribronchovascular  nodularity in the lingula may be infectious or post infectious in etiology. No pleural fluid. Airway is unremarkable. Musculoskeletal: No worrisome lytic or sclerotic lesions. Degenerative changes in the spine. CT ABDOMEN PELVIS FINDINGS Hepatobiliary: Liver is decreased in attenuation diffusely. Liver and gallbladder are otherwise unremarkable. No biliary ductal dilatation. Pancreas: Negative. Spleen: Measures 14.3 cm. Adrenals/Urinary Tract: 1.6 cm fat density lesion in the right adrenal gland. Left adrenal gland is unremarkable. Subcentimeter low-attenuation lesions in the kidneys are too small to characterize but statistically, cysts are most likely. Ureters are decompressed. Bladder is  grossly unremarkable. Stomach/Bowel: Stomach, small bowel and colon are unremarkable. Appendix is not readily visualized. Vascular/Lymphatic: Trace atherosclerotic calcification of the aorta. No pathologically enlarged lymph nodes. No free fluid. Reproductive: Prostate is visualized. Other: No free fluid.  Mesenteries and peritoneum are unremarkable. Musculoskeletal: Degenerative changes in the spine. No worrisome lytic or sclerotic lesions. IMPRESSION: 1. Hepatic steatosis. 2. Mild splenomegaly. 3. Right adrenal myelolipoma. Electronically Signed   By: Lorin Picket M.D.   On: 06/07/2018 13:11   Ct Biopsy  Result Date: 06/13/2018 INDICATION: 57 year old male with rheumatoid arthritis on chronic immunosuppression with Enbrel. New onset pancytopenia. EXAM: CT GUIDED BONE MARROW ASPIRATION AND CORE BIOPSY Interventional Radiologist:  Criselda Peaches, MD MEDICATIONS: None. ANESTHESIA/SEDATION: Moderate (conscious) sedation was employed during this procedure. A total of 2 milligrams versed and 100 micrograms fentanyl were administered intravenously. The patient's level of consciousness and vital signs were monitored continuously by radiology nursing throughout the procedure under my direct supervision. Total monitored sedation time: 10 minutes FLUOROSCOPY TIME:  Fluoroscopy Time: 0 minutes 0 seconds (0 mGy). COMPLICATIONS: None immediate. Estimated blood loss: <25 mL PROCEDURE: Informed written consent was obtained from the patient after a thorough discussion of the procedural risks, benefits and alternatives. All questions were addressed. Maximal Sterile Barrier Technique was utilized including caps, mask, sterile gowns, sterile gloves, sterile drape, hand hygiene and skin antiseptic. A timeout was performed prior to the initiation of the procedure. The patient was positioned prone and non-contrast localization CT was performed of the pelvis to demonstrate the iliac marrow spaces. Maximal barrier sterile  technique utilized including caps, mask, sterile gowns, sterile gloves, large sterile drape, hand hygiene, and betadine prep. Under sterile conditions and local anesthesia, an 11 gauge coaxial bone biopsy needle was advanced into the right iliac marrow space. Needle position was confirmed with CT imaging. Initially, bone marrow aspiration was performed. Next, the 11 gauge outer cannula was utilized to obtain a right iliac bone marrow core biopsy. Needle was removed. Hemostasis was obtained with compression. The patient tolerated the procedure well. Samples were prepared with the cytotechnologist. IMPRESSION: Technically successful CT-guided bone marrow aspiration and biopsy. Electronically Signed   By: Jacqulynn Cadet M.D.   On: 06/13/2018 13:19   Ct Bone Marrow Biopsy & Aspiration  Result Date: 06/13/2018 INDICATION: 57 year old male with rheumatoid arthritis on chronic immunosuppression with Enbrel. New onset pancytopenia. EXAM: CT GUIDED BONE MARROW ASPIRATION AND CORE BIOPSY Interventional Radiologist:  Criselda Peaches, MD MEDICATIONS: None. ANESTHESIA/SEDATION: Moderate (conscious) sedation was employed during this procedure. A total of 2 milligrams versed and 100 micrograms fentanyl were administered intravenously. The patient's level of consciousness and vital signs were monitored continuously by radiology nursing throughout the procedure under my direct supervision. Total monitored sedation time: 10 minutes FLUOROSCOPY TIME:  Fluoroscopy Time: 0 minutes 0 seconds (0 mGy). COMPLICATIONS: None immediate. Estimated blood loss: <25 mL PROCEDURE: Informed written consent was obtained from the patient after a thorough  discussion of the procedural risks, benefits and alternatives. All questions were addressed. Maximal Sterile Barrier Technique was utilized including caps, mask, sterile gowns, sterile gloves, sterile drape, hand hygiene and skin antiseptic. A timeout was performed prior to the initiation  of the procedure. The patient was positioned prone and non-contrast localization CT was performed of the pelvis to demonstrate the iliac marrow spaces. Maximal barrier sterile technique utilized including caps, mask, sterile gowns, sterile gloves, large sterile drape, hand hygiene, and betadine prep. Under sterile conditions and local anesthesia, an 11 gauge coaxial bone biopsy needle was advanced into the right iliac marrow space. Needle position was confirmed with CT imaging. Initially, bone marrow aspiration was performed. Next, the 11 gauge outer cannula was utilized to obtain a right iliac bone marrow core biopsy. Needle was removed. Hemostasis was obtained with compression. The patient tolerated the procedure well. Samples were prepared with the cytotechnologist. IMPRESSION: Technically successful CT-guided bone marrow aspiration and biopsy. Electronically Signed   By: Jacqulynn Cadet M.D.   On: 06/13/2018 13:19

## 2018-07-08 NOTE — Progress Notes (Signed)
Pharmacy Note  Subjective: Patient presents today to the [facility] to see Dr. Estanislado Pandy.  Patient seen by the pharmacist for counseling on subcutaneous Orencia for rheumatoid arthritis.  Prior therapy included Enbrel but was discontinued due to medication induced neutropenia and inadequate response to methotrexate.  Objective: CBC    Component Value Date/Time   WBC 3.0 (L) 07/04/2018 0946   WBC 2.1 (L) 06/13/2018 0728   RBC 3.81 (L) 07/04/2018 0946   HGB 12.2 (L) 07/04/2018 0946   HCT 36.5 (L) 07/04/2018 0946   PLT 150 07/04/2018 0946   MCV 95.8 07/04/2018 0946   MCH 32.0 07/04/2018 0946   MCHC 33.4 07/04/2018 0946   RDW 13.1 07/04/2018 0946   LYMPHSABS 1.4 07/04/2018 0946   MONOABS 0.8 07/04/2018 0946   EOSABS 0.0 07/04/2018 0946   BASOSABS 0.0 07/04/2018 0946    CMP     Component Value Date/Time   NA 141 07/04/2018 0946   K 4.5 07/04/2018 0946   CL 107 07/04/2018 0946   CO2 27 07/04/2018 0946   GLUCOSE 105 07/04/2018 0946   BUN 7 07/04/2018 0946   CREATININE 0.60 07/04/2018 0946   CREATININE 0.83 05/07/2018 0832   CALCIUM 8.7 07/04/2018 0946   PROT 7.2 07/04/2018 0946   ALBUMIN 3.3 (L) 07/04/2018 0946   AST 53 (H) 07/04/2018 0946   ALT 53 (H) 07/04/2018 0946   ALKPHOS 59 07/04/2018 0946   BILITOT 1.0 07/04/2018 0946   GFRNONAA >60 06/07/2018 1028   GFRNONAA 98 05/07/2018 0832   GFRAA >60 06/07/2018 1028   GFRAA 113 05/07/2018 0832    Quantiferon TB Gold Latest Ref Rng & Units 05/24/2018  Quantiferon TB Gold Plus Negative Negative    Hepatitis Latest Ref Rng & Units 05/24/2018  Hep B Surface Ag Negative Negative    Lab Results  Component Value Date   HIV Non Reactive 05/24/2018    Serum Protein Electrophoresis Latest Ref Rng & Units 07/04/2018  Total Protein 6.4 - 8.1 g/dL 7.2   Immunoglobulins: within normal limits on 05/15/14  Chest x-ray: normal 12/26/2012  Does patient have a diagnosis of COPD? No  Assessment/Plan:  Counseled  patient that Maureen Chatters is a selective T-cell costimulation blocker indicated for rheumatoid arthritis.  Counseled patient on purpose, proper use, and adverse effects of Orencia. The most common adverse effects are increased risk of infections, headache, and injection site reactions.  There is the possibility of an increased risk of malignancy but it is not well understood if this increased risk is due to the medication or the disease state.  Reviewed the importance of regular labs while on Orencia therapy.  Counseled patient that Maureen Chatters should be held prior to scheduled surgery.  Counseled patient to avoid live vaccines while on Orencia.  Advised patient to get annual influenza vaccine and the pneumococcal vaccine as indicated.  Provided patient with medication education material and answered all questions.  Patient consented to [facility].  Will upload consent into patient's chart.  Will apply for Orencia through patient's insurance.  Reviewed storage information for Orencia.  Advised initial injection must be administered in office.    Patient here with caregiver who administers his injections. Demonstrated proper injection technique with Orencia demo pen.  Caregiver able to demonstrate proper injection technique using the teach back method. Caregiver injected in the left upper arm with:  Sample Medication: Orencia Clickjet 125mg /ml NDC: history of seropositive rheumatoid arthritis, fibromyalgia and osteoarthritis Lot: [id number] Expiration: 05/2019  Patient tolerated well.  Observed  for 30 mins in office for adverse reaction and none noted.  Medication Samples have been provided to the patient.  Drug name: Orencia Clickjet      Strength: 125mg /ml     Qty: 2  LOT: [id number]  Exp.Date: 05/2019  Dosing instructions: Inject into the skin every week.  The patient has been instructed regarding the correct time, dose, and frequency of taking this medication, including desired effects and most common side  effects.   Prescription sent to [facility].  iPatient was set up with co-pay card and shipment from [place] for 07/25/18 by patient advocate Rachael.  All questions encouraged and answered. nstructed patient to call with any further questions/issues.    Mariella Saa, PharmD, [facility] Rheumatology Clinical Pharmacist  07/09/2018 11:03 AM

## 2018-07-09 ENCOUNTER — Ambulatory Visit (INDEPENDENT_AMBULATORY_CARE_PROVIDER_SITE_OTHER): Payer: BLUE CROSS/BLUE SHIELD | Admitting: Pharmacist

## 2018-07-09 VITALS — BP 139/80 | HR 75

## 2018-07-09 DIAGNOSIS — M0579 Rheumatoid arthritis with rheumatoid factor of multiple sites without organ or systems involvement: Secondary | ICD-10-CM

## 2018-07-09 MED ORDER — ABATACEPT 125 MG/ML ~~LOC~~ SOAJ: 125.0000 mg | SUBCUTANEOUS | 2 refills | Status: DC

## 2018-07-09 MED ORDER — ABATACEPT 125 MG/ML ~~LOC~~ SOAJ
125.0000 mg | Freq: Once | SUBCUTANEOUS | Status: AC
  Administered 2018-07-09: 125 mg via SUBCUTANEOUS

## 2018-07-09 NOTE — Addendum Note (Signed)
Addended by: Mariella Saa C on: 07/09/2018 03:17 PM   Modules accepted: Orders

## 2018-07-09 NOTE — Patient Instructions (Signed)
Labs due at next appointment.  Vaccines You are taking a medication(s) that can suppress your immune system.  The following immunizations are recommended: . Flu annually . Pneumonia (Pneumovax 4 and Prevnar 13 after age 58) . Shingrix  Please check with your PCP to make sure you are up to date.    Abatacept solution for injection (subcutaneous or intravenous use) What is this medicine? ABATACEPT (a ba TA sept) is used to treat moderate to severe active rheumatoid arthritis or psoriatic arthritis in adults. This medicine is also used to treat juvenile idiopathic arthritis. This medicine may be used for other purposes; ask your health care provider or pharmacist if you have questions. COMMON BRAND NAME(S): Orencia What should I tell my health care provider before I take this medicine? They need to know if you have any of these conditions: -are taking other medicines to treat rheumatoid arthritis -COPD -diabetes -infection or history of infections -recently received or scheduled to receive a vaccine -scheduled to have surgery -tuberculosis, a positive skin test for tuberculosis or have recently been in close contact with someone who has tuberculosis -viral hepatitis -an unusual or allergic reaction to abatacept, other medicines, foods, dyes, or preservatives -pregnant or trying to get pregnant -breast-feeding How should I use this medicine? This medicine is for infusion into a vein or for injection under the skin. Infusions are given by a health care professional in a hospital or clinic setting. If you are to give your own medicine at home, you will be taught how to prepare and give this medicine under the skin. Use exactly as directed. Take your medicine at regular intervals. Do not take your medicine more often than directed. It is important that you put your used needles and syringes in a special sharps container. Do not put them in a trash can. If you do not have a sharps container,  call your pharmacist or healthcare provider to get one. Talk to your pediatrician regarding the use of this medicine in children. While infusions in a clinic may be prescribed for children as young as 2 years for selected conditions, precautions do apply. Overdosage: If you think you have taken too much of this medicine contact a poison control center or emergency room at once. NOTE: This medicine is only for you. Do not share this medicine with others. What if I miss a dose? This medicine is used once a week if given by injection under the skin. If you miss a dose, take it as soon as you can. If it is almost time for your next dose, take only that dose. Do not take double or extra doses. If you are to be given an infusion, it is important not to miss your dose. Doses are usually every 4 weeks. Call your doctor or health care professional if you are unable to keep an appointment. What may interact with this medicine? Do not take this medicine with any of the following medications: -adalimumab -anakinra -certolizumab -etanercept -golimumab -infliximab -live virus vaccines -rituximab -tocilizumab This medicine may also interact with the following medications: -vaccines This list may not describe all possible interactions. Give your health care provider a list of all the medicines, herbs, non-prescription drugs, or dietary supplements you use. Also tell them if you smoke, drink alcohol, or use illegal drugs. Some items may interact with your medicine. What should I watch for while using this medicine? Visit your doctor for regular check ups while you are taking this medicine. Tell your doctor or  healthcare professional if your symptoms do not start to get better or if they get worse. Call your doctor or health care professional if you get a cold or other infection while receiving this medicine. Do not treat yourself. This medicine may decrease your body's ability to fight infection. Try to avoid  being around people who are sick. What side effects may I notice from receiving this medicine? Side effects that you should report to your doctor or health care professional as soon as possible: -allergic reactions like skin rash, itching or hives, swelling of the face, lips, or tongue -breathing problems -chest pain -signs of infection - fever or chills, cough, unusual tiredness, pain or trouble passing urine, or warm, red or painful skin Side effects that usually do not require medical attention (report to your doctor or health care professional if they continue or are bothersome): -dizziness -headache -nausea, vomiting -sore throat -stomach upset This list may not describe all possible side effects. Call your doctor for medical advice about side effects. You may report side effects to FDA at 1-800-FDA-1088. Where should I keep my medicine? Infusions will be given in a hospital or clinic and will not be stored at home. Storage for syringes given under the skin and stored at home: Keep out of the reach of children. Store in a refrigerator between 2 and 8 degrees C (36 and 46 degrees F). Keep this medicine in the original container. Protect from light. Do not freeze. Throw away any unused medicine after the expiration date. NOTE: This sheet is a summary. It may not cover all possible information. If you have questions about this medicine, talk to your doctor, pharmacist, or health care provider.  2018 Elsevier/Gold Standard (2016-02-24 10:07:35)

## 2018-07-15 NOTE — Progress Notes (Signed)
Office Visit Note  Patient: Daniel Griffin             Date of Birth: 11-23-1960           MRN: [id number]             PCP: Default, Provider, MD Referring: No ref. provider found Visit Date: 07/29/2018 Occupation: @GUAROCC @  Subjective:  Pain in both hands    History of Present Illness: Daniel Griffin is a 57 y.o. male with history of seropositive rheumatoid arthritis and osteoarthritis.  He was started on Orencia sq injections on 07/09/18.  He has been tolerating it well and has not had any side effects.  He has not missed any doses since starting.  He continues to have pain and swelling in both hands.  He states both knee joints have been more stiff lately but he denies any knee joint swelling.  He denies any pain in his feet at this time.  He has stopped taking goody power and has only had 1 Aleve in 3 weeks.  He denies any recent infections.  He received the seasonal influenza vaccination.  He will be following up with hematology on 08/09/18.     Activities of Daily Living:  Patient reports morning stiffness for 10-15 minutes.   Patient Denies nocturnal pain.  Difficulty dressing/grooming: Reports Difficulty climbing stairs: Denies Difficulty getting out of chair: Denies Difficulty using hands for taps, buttons, cutlery, and/or writing: Reports  Review of Systems  Constitutional: Negative for fatigue and night sweats.  HENT: Negative for mouth sores, trouble swallowing, trouble swallowing, mouth dryness and nose dryness.   Eyes: Negative for pain, redness, itching, visual disturbance and dryness.  Respiratory: Negative for cough, hemoptysis, shortness of breath, wheezing and difficulty breathing.   Cardiovascular: Negative for chest pain, palpitations, hypertension, irregular heartbeat and swelling in legs/feet.  Gastrointestinal: Negative for abdominal pain, constipation, diarrhea and nausea.  Endocrine: Negative for increased urination.  Genitourinary: Negative for  painful urination, pelvic pain and urgency.  Musculoskeletal: Positive for arthralgias, joint pain, joint swelling and morning stiffness. Negative for myalgias, muscle weakness, muscle tenderness and myalgias.  Skin: Negative for color change, rash, hair loss, nodules/bumps, skin tightness, ulcers and sensitivity to sunlight.  Allergic/Immunologic: Negative for susceptible to infections.  Neurological: Negative for dizziness, fainting, light-headedness, headaches, memory loss, night sweats and weakness.  Hematological: Negative for bruising/bleeding tendency and swollen glands.  Psychiatric/Behavioral: Negative for depressed mood, confusion and sleep disturbance. The patient is not nervous/anxious.     PMFS History:  Patient Active Problem List   Diagnosis Date Noted  . Transaminitis 07/04/2018  . Leukopenia 05/22/2018  . Rheumatoid arthritis involving multiple sites with positive rheumatoid factor ([place]) 11/30/2016  . High risk medications (not anticoagulants) long-term use 11/30/2016  . Pain in both hands 11/30/2016  . Pain in joints of both feet 11/30/2016    Past Medical History:  Diagnosis Date  . Arthritis     Family History  Problem Relation Age of Onset  . Alzheimer's disease Father   . Healthy Son   . Healthy Daughter   . Rheum arthritis Daughter   . Healthy Daughter    Past Surgical History:  Procedure Laterality Date  . APPENDECTOMY     Social History   Social History Narrative  . Not on file    Objective: Vital Signs: BP (!) 156/89 (BP Location: Left Arm, Patient Position: Sitting, Cuff Size: Normal)   Pulse 74   Resp 15   Ht  5\' 8"  (1.727 m)   Wt 222 lb 9.6 oz (101 kg)   BMI 33.85 kg/m    Physical Exam  Constitutional: He is oriented to person, place, and time. He appears well-developed and well-nourished.  HENT:  Head: Normocephalic and atraumatic.  Eyes: Pupils are equal, round, and reactive to light. Conjunctivae and EOM are normal.  Neck: Normal  range of motion. Neck supple.  Cardiovascular: Normal rate, regular rhythm and normal heart sounds.  Pulmonary/Chest: Effort normal and breath sounds normal.  Abdominal: Soft. Bowel sounds are normal.  Lymphadenopathy:    He has no cervical adenopathy.  Neurological: He is alert and oriented to person, place, and time.  Skin: Skin is warm and dry. Capillary refill takes less than 2 seconds.  Psychiatric: He has a normal mood and affect. His behavior is normal.  Nursing note and vitals reviewed.    Musculoskeletal Exam: C-spine good ROM.  Thoracic spine and lumbar spine good ROM.  No midline spinal tenderness.  No SI joint tenderness.  Shoulder joints full ROM with bilateral shoulder joint crepitus.  Left elbow joint contracture.  Right elbow joint slightly limited ROM. Limited ROM of both wrists. MCPs, PIPs,and DIPs good ROM.  Synovitis as described below. Hip joints good ROM with no discomfort.  Knee joints good ROM with no discomfort.  No warmth or effusion noted.  No tenderness or swelling of ankle joints.   CDAI Exam: CDAI Score: 8.8  Patient Global Assessment: 4 (mm); Provider Global Assessment: 4 (mm) Swollen: 4 ; Tender: 4  Joint Exam      Right  Left  MCP 2  Swollen Tender  Swollen Tender  MCP 3  Swollen Tender     MCP 5  Swollen Tender        Investigation: No additional findings.  Imaging: No results found.  Recent Labs: Lab Results  Component Value Date   WBC 3.0 (L) 07/04/2018   HGB 12.2 (L) 07/04/2018   PLT 150 07/04/2018   NA 141 07/04/2018   K 4.5 07/04/2018   CL 107 07/04/2018   CO2 27 07/04/2018   GLUCOSE 105 07/04/2018   BUN 7 07/04/2018   CREATININE 0.60 07/04/2018   BILITOT 1.0 07/04/2018   ALKPHOS 59 07/04/2018   AST 53 (H) 07/04/2018   ALT 53 (H) 07/04/2018   PROT 7.2 07/04/2018   ALBUMIN 3.3 (L) 07/04/2018   CALCIUM 8.7 07/04/2018   GFRAA >60 06/07/2018   QFTBGOLD NEGATIVE 07/03/2017   QFTBGOLDPLUS Negative 05/24/2018    Speciality  Comments: Prior therapy includes: Enbrel (pancytopenia) and methotrexate (inadequate response)  Procedures:  No procedures performed Allergies: Patient has no known allergies.     Assessment / Plan:     Visit Diagnoses: Rheumatoid arthritis involving multiple sites with positive rheumatoid factor ([place]): He has synovitis as described above. He has been experiencing pain in both hands, both shoulder joints, and both knee joints. He has been flaring since being off of Enbrel  He was started on Orencia 125 mg sq injections once weekly on 07/09/18. He has been tolerating Orencia without any side effects.  He has not missed any doses since starting on Orencia. He was previously doing well on Enbrel but developed neutropenia.  He continues to follow up with hematology.  His next hematology appointment is on 08/09/18.  He has discontinued use of goody powder and has only taking 1 dose of Aleve in 3 weeks.  Due to his significant discomfort and active synovitis, we will start  him on a prednisone taper starting at 20 mg and tapering by 5 mg every week.  We will check a CBC today prior to starting on Prednisone.  We discussed that prednisone may falsely raise his WBC count.  He was advised to notify us if he continues to have joint pain and joint swelling after completely the prednisone taper.  He will follow up our office in 3 months.   High risk medications (not anticoagulants) long-term use -Orencia started on 07/09/18. d/c Enbrel due to neurtropenia.  He will be following up with hematology on 08/09/18.  We will check CBC today.  He has not had any recent infections.  He received his yearly influenza vaccination.  We discussed the immunosuppressive effects of Orencia.  He voiced understanding that he will need to hold his dose of Orencia if he has an infection. - Plan: CBC with Differential/Platelet  Primary osteoarthritis of both hands: He has PIP and DIP synovial thickening.  Complete fist formation  bilaterally.  He has been having increased stiffness in both hands.  Joint protection and muscle strengthening were discussed.   Primary osteoarthritis of both feet: He has no discomfort at this time.  He wears proper fitting shoes.   Orders: Orders Placed This Encounter  Procedures  . CBC with Differential/Platelet   Meds ordered this encounter  Medications  . predniSONE (DELTASONE) 5 MG tablet    Sig: Take 4 tablets by mouth daily x1 wk, 3 tablets by mouth daily x1 wk, 2 tablets by mouth daily x1 wk, 1 tablet by mouth daily x1 wk.    Dispense:  70 tablet    Refill:  0     Follow-Up Instructions: Return in about 3 months (around 10/28/2018) for Rheumatoid arthritis, Osteoarthritis.   Ofilia Neas, PA-C   I examined and evaluated the patient with Hazel Sams PA.  Patient is still a significant synovitis in his hands.  He has been on Orencia subcu injections for last 3 weeks.  We discussed adding prednisone as a bridging therapy until Orencia gets into his system.  It will increase his WBC count temporarily.  We will continue to monitor his labs to observe neutropenia.  He was in agreement.  Side effects of prednisone were discussed at length.  The plan of care was discussed as noted above.  Bo Merino, MD  Note - This record has been created using Editor, commissioning.  Chart creation errors have been sought, but may not always  have been located. Such creation errors do not reflect on  the standard of medical care.

## 2018-07-17 LAB — FLOW CYTOMETRY

## 2018-07-24 MED FILL — ORENCIA 125 MG/ML SYRINGE: 125 | 28 days supply | Qty: 4 | Fill #0

## 2018-07-29 ENCOUNTER — Encounter: Payer: Self-pay | Admitting: Physician Assistant

## 2018-07-29 ENCOUNTER — Ambulatory Visit (INDEPENDENT_AMBULATORY_CARE_PROVIDER_SITE_OTHER): Payer: BLUE CROSS/BLUE SHIELD | Admitting: Rheumatology

## 2018-07-29 VITALS — BP 156/89 | HR 74 | Resp 15 | Ht 68.0 in | Wt 222.6 lb

## 2018-07-29 DIAGNOSIS — M19071 Primary osteoarthritis, right ankle and foot: Secondary | ICD-10-CM

## 2018-07-29 DIAGNOSIS — Z79899 Other long term (current) drug therapy: Secondary | ICD-10-CM | POA: Diagnosis not present

## 2018-07-29 DIAGNOSIS — M0579 Rheumatoid arthritis with rheumatoid factor of multiple sites without organ or systems involvement: Secondary | ICD-10-CM | POA: Diagnosis not present

## 2018-07-29 DIAGNOSIS — M19041 Primary osteoarthritis, right hand: Secondary | ICD-10-CM

## 2018-07-29 DIAGNOSIS — M19042 Primary osteoarthritis, left hand: Secondary | ICD-10-CM

## 2018-07-29 DIAGNOSIS — M19072 Primary osteoarthritis, left ankle and foot: Secondary | ICD-10-CM

## 2018-07-29 LAB — CBC WITH DIFFERENTIAL/PLATELET
Basophils Absolute: 20 cells/uL (ref 0–200)
Basophils Relative: 1 %
Eosinophils Absolute: 50 cells/uL (ref 15–500)
Eosinophils Relative: 2.5 %
HCT: 36.9 % — ABNORMAL LOW (ref 38.5–50.0)
Hemoglobin: 12.6 g/dL — ABNORMAL LOW (ref 13.2–17.1)
Lymphs Abs: 882 cells/uL (ref 850–3900)
MCH: 31.6 pg (ref 27.0–33.0)
MCHC: 34.1 g/dL (ref 32.0–36.0)
MCV: 92.5 fL (ref 80.0–100.0)
MPV: 9.8 fL (ref 7.5–12.5)
Monocytes Relative: 24 %
Neutro Abs: 568 cells/uL — ABNORMAL LOW (ref 1500–7800)
Neutrophils Relative %: 28.4 %
Platelets: 163 10*3/uL (ref 140–400)
RBC: 3.99 10*6/uL — ABNORMAL LOW (ref 4.20–5.80)
RDW: 13.1 % (ref 11.0–15.0)
Total Lymphocyte: 44.1 %
WBC mixed population: 480 cells/uL (ref 200–950)
WBC: 2 10*3/uL — ABNORMAL LOW (ref 3.8–10.8)

## 2018-07-29 MED ORDER — PREDNISONE 5 MG PO TABS: ORAL_TABLET | ORAL | 0 refills | Status: DC

## 2018-07-30 NOTE — Progress Notes (Signed)
WBC count is low-2.0.  He was recently switched from Enbrel to [place].  He was started on Prednisone 20 mg tapering by 5 mg every 4 days yesterday, which will artificially raise the WBC count.  He will be following up with hematology soon. We will continue to monitor. Please advise patient to return in 1 month to recheck CBC.

## 2018-08-01 ENCOUNTER — Ambulatory Visit: Payer: BLUE CROSS/BLUE SHIELD | Admitting: Hematology

## 2018-08-01 ENCOUNTER — Other Ambulatory Visit: Payer: BLUE CROSS/BLUE SHIELD

## 2018-08-02 ENCOUNTER — Ambulatory Visit: Payer: BLUE CROSS/BLUE SHIELD | Admitting: Hematology

## 2018-08-02 ENCOUNTER — Other Ambulatory Visit: Payer: BLUE CROSS/BLUE SHIELD

## 2018-08-06 ENCOUNTER — Telehealth: Payer: Self-pay | Admitting: Pharmacist

## 2018-08-06 NOTE — Telephone Encounter (Signed)
Received fax form [facility] that Maureen Chatters rx was received and they will ship to patient on 08/08/18. Called [facility] to cancel rx. Patient had prescription filled by [facility] and it was shipped on 07/24/18.

## 2018-08-19 ENCOUNTER — Encounter: Payer: Self-pay | Admitting: *Deleted

## 2018-08-22 ENCOUNTER — Telehealth: Payer: Self-pay | Admitting: Pharmacy Technician

## 2018-08-22 MED FILL — ORENCIA CLICKJECT 125 MG/ML: 125 | 28 days supply | Qty: 4 | Fill #0

## 2018-08-22 NOTE — Telephone Encounter (Signed)
Received a fax from [employer] regarding a prior authorization for [place]. Authorization has been APPROVED from 08/22/2018 to 08/20/2021.   Will send document to scan center.  Authorization # [id number] to advise, they will send medication to patient.

## 2018-08-22 NOTE — Telephone Encounter (Signed)
Received message from Daniel Griffin, stating Daniel Griffin is rejecting as not covered. PA was approved through 2039. [employer], per  rep, Plan's formulary changed as of 08/21/2018. New PA must be submitted.   Received a Prior Authorization request from [facility] for Daniel Griffin. Authorization has been submitted to patient's insurance via Cover My Meds. Will update once we receive a response.  Left message for patient to advise his shipment will be delayed and to call office to see if he needs to pickup any samples.  11:52 AM Beatriz Chancellor, CPhT

## 2018-08-29 ENCOUNTER — Ambulatory Visit: Payer: BLUE CROSS/BLUE SHIELD | Admitting: Hematology

## 2018-08-29 ENCOUNTER — Other Ambulatory Visit: Payer: BLUE CROSS/BLUE SHIELD

## 2018-09-19 MED FILL — ORENCIA CLICKJECT 125 MG/ML: 125 | 28 days supply | Qty: 4 | Fill #1

## 2018-10-18 ENCOUNTER — Other Ambulatory Visit: Payer: Self-pay | Admitting: Rheumatology

## 2018-10-18 DIAGNOSIS — M0579 Rheumatoid arthritis with rheumatoid factor of multiple sites without organ or systems involvement: Secondary | ICD-10-CM

## 2018-10-18 NOTE — Telephone Encounter (Signed)
ok refill Orencia.  Please have patient repeat labs next week.

## 2018-10-18 NOTE — Progress Notes (Signed)
Office Visit Note  Patient: Daniel Griffin             Date of Birth: 1961/02/27           MRN: [id number]             PCP: Default, Provider, MD Referring: No ref. provider found Visit Date: 10/31/2018 Occupation: @GUAROCC @  Subjective:  Medication monitoring   History of Present Illness: Daniel Griffin is a 58 y.o. male with history of seropositive rheumatoid arthritis and osteoarthritis.  Patient is on Orencia 125 mg subcutaneous injections once weekly.  He has noticed improvement since switching from Enbrel to [place].  He has not missed any doses of Orencia recently.  He continues to have morning stiffness lasting 15 to 20 minutes.  Continues to have some pain and stiffness in both hands but denies any joint swelling at this time.  He denies any recent flares.  He denies any recent infections.  He continues to follow-up with hematology.  Activities of Daily Living:  Patient reports morning stiffness for 15-20 minutes.   Patient Denies nocturnal pain.  Difficulty dressing/grooming: Denies Difficulty climbing stairs: Denies Difficulty getting out of chair: Denies Difficulty using hands for taps, buttons, cutlery, and/or writing: Denies  Review of Systems  Constitutional: Negative for fatigue and night sweats.  HENT: Negative for mouth sores, mouth dryness and nose dryness.   Eyes: Negative for redness and dryness.  Respiratory: Negative for cough, hemoptysis, shortness of breath and difficulty breathing.   Cardiovascular: Negative for chest pain, palpitations, hypertension, irregular heartbeat and swelling in legs/feet.  Gastrointestinal: Negative for blood in stool, constipation and diarrhea.  Endocrine: Negative for increased urination.  Genitourinary: Negative for painful urination.  Musculoskeletal: Positive for arthralgias, joint pain and morning stiffness. Negative for joint swelling, myalgias, muscle weakness, muscle tenderness and myalgias.  Skin: Negative for color  change, rash, hair loss, nodules/bumps, skin tightness, ulcers and sensitivity to sunlight.  Allergic/Immunologic: Negative for susceptible to infections.  Neurological: Negative for dizziness, fainting, memory loss, night sweats and weakness.  Hematological: Negative for swollen glands.  Psychiatric/Behavioral: Negative for depressed mood and sleep disturbance. The patient is not nervous/anxious.     PMFS History:  Patient Active Problem List   Diagnosis Date Noted  . Transaminitis 07/04/2018  . Leukopenia 05/22/2018  . Rheumatoid arthritis involving multiple sites with positive rheumatoid factor ([name]) 11/30/2016  . High risk medications (not anticoagulants) long-term use 11/30/2016  . Pain in both hands 11/30/2016  . Pain in joints of both feet 11/30/2016    Past Medical History:  Diagnosis Date  . Arthritis     Family History  Problem Relation Age of Onset  . Alzheimer's disease Father   . Healthy Son   . Healthy Daughter   . Rheum arthritis Daughter   . Healthy Daughter    Past Surgical History:  Procedure Laterality Date  . APPENDECTOMY     Social History   Social History Narrative  . Not on file   Immunization History  Administered Date(s) Administered  . Influenza,inj,Quad PF,6+ Mos 07/04/2018     Objective: Vital Signs: BP (!) 128/92 (BP Location: Left Arm, Patient Position: Sitting, Cuff Size: Normal)   Pulse 81   Resp 14   Ht 5\' 6"  (1.676 m)   Wt 220 lb 6.4 oz (100 kg)   BMI 35.57 kg/m    Physical Exam Vitals signs and nursing note reviewed.  Constitutional:      Appearance: He is well-developed.  HENT:     Head: Normocephalic and atraumatic.  Eyes:     Conjunctiva/sclera: Conjunctivae normal.     Pupils: Pupils are equal, round, and reactive to light.  Neck:     Musculoskeletal: Normal range of motion and neck supple.  Cardiovascular:     Rate and Rhythm: Normal rate and regular rhythm.     Heart sounds: Normal heart sounds.  Pulmonary:      Effort: Pulmonary effort is normal.     Breath sounds: Normal breath sounds.  Abdominal:     General: Bowel sounds are normal.     Palpations: Abdomen is soft.  Lymphadenopathy:     Cervical: No cervical adenopathy.  Skin:    General: Skin is warm and dry.     Capillary Refill: Capillary refill takes less than 2 seconds.  Neurological:     Mental Status: He is alert and oriented to person, place, and time.  Psychiatric:        Behavior: Behavior normal.      Musculoskeletal Exam: C-spine slightly limited range of motion with lateral rotation.  Thoracic and lumbar spine good range of motion.  No midline spinal tenderness.  No SI joint tenderness.  Shoulder joints, elbow joints, wrist joints, MCPs, PIPs, DIPs good range of motion with no synovitis.  He has synovial thickening of the wrist joint but no synovitis or tenderness was noted.  Hip joints, knee joints, ankle joints, MCPs, PIPs, DIPs good range of motion with no synovitis.  No warmth or effusion bilateral knee joints.  No tenderness or swelling of ankle joints.  No tenderness over trochanteric bursa bilaterally.  CDAI Exam: CDAI Score: Not documented Patient Global Assessment: Not documented; Provider Global Assessment: Not documented Swollen: Not documented; Tender: Not documented Joint Exam   Not documented   There is currently no information documented on the homunculus. Go to the Rheumatology activity and complete the homunculus joint exam.  Investigation: No additional findings.  Imaging: No results found.  Recent Labs: Lab Results  Component Value Date   WBC 2.0 (L) 07/29/2018   HGB 12.6 (L) 07/29/2018   PLT 163 07/29/2018   NA 141 07/04/2018   K 4.5 07/04/2018   CL 107 07/04/2018   CO2 27 07/04/2018   GLUCOSE 105 07/04/2018   BUN 7 07/04/2018   CREATININE 0.60 07/04/2018   BILITOT 1.0 07/04/2018   ALKPHOS 59 07/04/2018   AST 53 (H) 07/04/2018   ALT 53 (H) 07/04/2018   PROT 7.2 07/04/2018   ALBUMIN  3.3 (L) 07/04/2018   CALCIUM 8.7 07/04/2018   GFRAA >60 06/07/2018   QFTBGOLD NEGATIVE 07/03/2017   QFTBGOLDPLUS Negative 05/24/2018    Speciality Comments: Prior therapy includes: Enbrel (pancytopenia) and methotrexate (inadequate response)  Procedures:  No procedures performed Allergies: Patient has no known allergies.     Assessment / Plan:     Visit Diagnoses:Rheumatoid arthritis involving multiple sites with positive rheumatoid factor [facility]): He has no synovitis on exam today.  He has synovial thickening of the right wrist joint but no tenderness or synovitis noted. He has stiffness and discomfort in both hands but has not noticed any joint swelling recently.  He has not been on prednisone recently. He has noted significant benefits since switching from Enbrel to [place].  He has been injecting Orencia 125 mg subcutaneously once weekly.  He has not missed any doses recently.  We will check CBC and CMP today.  He has a history of neutropenia and continues to follow-up  with hematology.  He will continue on his current treatment regimen.  He does not need any refills at this time.  He will continue to have CBC drawn every 6 weeks and CMP drawn every 3 months.  Standing orders are in place.  He was advised to notify us if he develops increased joint pain or joint swelling.  He will follow-up in the office in 3 months.   High risk medications (not anticoagulants) long-term use - O[place] 125 mg sq once weekly-started 07/09/18, neutropenia with Enbrel. Last CBC WBC was decreased at 2 on 07/29/2018.  Last CMP within normal limits except for elevated AST/ALT on 07/04/2018.  Due for CBC/CMP today and will monitor every 3 months.  Standing orders are in place.  Patient received flu shot in November.  - Plan: COMPLETE METABOLIC PANEL WITH GFR, CBC with Differential/Platelet, CBC with Differential/Platelet, COMPLETE METABOLIC PANEL WITH GFR  Primary osteoarthritis of both hands: He has PIP and DIP synovial  thickening consistent with osteoarthritis both hands.  He has complete fist formation bilaterally.  He has been experiencing stiffness in both hands but no joint swelling.   No synovitis noted. Joint protection and muscle strengthening were discussed.    Primary osteoarthritis of both feet: He has no discomfort at this time.     Orders: No orders of the defined types were placed in this encounter.  No orders of the defined types were placed in this encounter.    Follow-Up Instructions: Return in about 3 months (around 01/31/2019) for Rheumatoid arthritis.   Ofilia Neas, PA-C  Note - This record has been created using Dragon software.  Chart creation errors have been sought, but may not always  have been located. Such creation errors do not reflect on  the standard of medical care.

## 2018-10-18 NOTE — Telephone Encounter (Signed)
Last Visit: 07/22/18 Next Visit: 10/31/18 Labs: 07/29/18 WBC count is low-2.0. He was recently switched from Enbrel to [place].  TB Gold: 05/24/18 Neg   Okay to refill 30 day supply Orencia?

## 2018-10-21 MED FILL — ORENCIA CLICKJECT 125 MG/ML: 125 | 28 days supply | Qty: 4 | Fill #0

## 2018-10-21 NOTE — Telephone Encounter (Signed)
Attempted to contact the patient and left message for patient to call the office.  

## 2018-10-23 NOTE — Telephone Encounter (Signed)
Patient's daughter left message returning call.  Attempted to return Karla's call and left message for her to return the call to the office.

## 2018-10-31 ENCOUNTER — Encounter: Payer: Self-pay | Admitting: Physician Assistant

## 2018-10-31 ENCOUNTER — Other Ambulatory Visit: Payer: Self-pay

## 2018-10-31 ENCOUNTER — Ambulatory Visit (INDEPENDENT_AMBULATORY_CARE_PROVIDER_SITE_OTHER): Payer: BLUE CROSS/BLUE SHIELD | Admitting: Physician Assistant

## 2018-10-31 VITALS — BP 128/92 | HR 81 | Resp 14 | Ht 66.0 in | Wt 220.4 lb

## 2018-10-31 DIAGNOSIS — M19071 Primary osteoarthritis, right ankle and foot: Secondary | ICD-10-CM

## 2018-10-31 DIAGNOSIS — M19042 Primary osteoarthritis, left hand: Secondary | ICD-10-CM

## 2018-10-31 DIAGNOSIS — M0579 Rheumatoid arthritis with rheumatoid factor of multiple sites without organ or systems involvement: Secondary | ICD-10-CM

## 2018-10-31 DIAGNOSIS — M19041 Primary osteoarthritis, right hand: Secondary | ICD-10-CM

## 2018-10-31 DIAGNOSIS — Z79899 Other long term (current) drug therapy: Secondary | ICD-10-CM

## 2018-10-31 DIAGNOSIS — M19072 Primary osteoarthritis, left ankle and foot: Secondary | ICD-10-CM

## 2018-10-31 NOTE — Patient Instructions (Signed)
Standing Labs We placed an order today for your standing lab work.    CBC every 6 weeks  CMP every 3 months   We have open lab Monday through Friday from 8:30-11:30 AM and 1:30-4:00 PM  at the office of Dr. Bo Merino.   You may experience shorter wait times on Monday and Friday afternoons. The office is located at [address] [place], [place], [place] [address] No appointment is necessary.   Labs are drawn by [facility].  You may receive a bill from [facility] for your lab work.  If you wish to have your labs drawn at another location, please call the office 24 hours in advance to send orders.  If you have any questions regarding directions or hours of operation,  please call [phone].   Just as a reminder please drink plenty of water prior to coming for your lab work. Thanks!

## 2018-11-01 LAB — COMPLETE METABOLIC PANEL WITH GFR
AG Ratio: 1.3 (calc) (ref 1.0–2.5)
ALT: 43 U/L (ref 9–46)
AST: 35 U/L (ref 10–35)
Albumin: 4.2 g/dL (ref 3.6–5.1)
Alkaline phosphatase (APISO): 52 U/L (ref 35–144)
BUN: 10 mg/dL (ref 7–25)
CO2: 27 mmol/L (ref 20–32)
Calcium: 9.1 mg/dL (ref 8.6–10.3)
Chloride: 100 mmol/L (ref 98–110)
Creat: 1.13 mg/dL (ref 0.70–1.33)
GFR, Est African American: 83 mL/min/{1.73_m2} (ref >=60)
GFR, Est Non African American: 71 mL/min/{1.73_m2} (ref >=60)
Globulin: 3.2 g/dL (calc) (ref 1.9–3.7)
Glucose, Bld: 113 mg/dL — ABNORMAL HIGH (ref 65–99)
Potassium: 4.4 mmol/L (ref 3.5–5.3)
Sodium: 136 mmol/L (ref 135–146)
Total Bilirubin: 1.4 mg/dL — ABNORMAL HIGH (ref 0.2–1.2)
Total Protein: 7.4 g/dL (ref 6.1–8.1)

## 2018-11-01 LAB — CBC WITH DIFFERENTIAL/PLATELET
Absolute Monocytes: 484 cells/uL (ref 200–950)
Basophils Absolute: 31 cells/uL (ref 0–200)
Basophils Relative: 1.1 %
Eosinophils Absolute: 188 cells/uL (ref 15–500)
Eosinophils Relative: 6.7 %
HCT: 41.4 % (ref 38.5–50.0)
Hemoglobin: 14.5 g/dL (ref 13.2–17.1)
Lymphs Abs: 888 cells/uL (ref 850–3900)
MCH: 32.9 pg (ref 27.0–33.0)
MCHC: 35 g/dL (ref 32.0–36.0)
MCV: 93.9 fL (ref 80.0–100.0)
MPV: 9.7 fL (ref 7.5–12.5)
Monocytes Relative: 17.3 %
Neutro Abs: 1210 cells/uL — ABNORMAL LOW (ref 1500–7800)
Neutrophils Relative %: 43.2 %
Platelets: 190 10*3/uL (ref 140–400)
RBC: 4.41 10*6/uL (ref 4.20–5.80)
RDW: 13.5 % (ref 11.0–15.0)
Total Lymphocyte: 31.7 %
WBC: 2.8 10*3/uL — ABNORMAL LOW (ref 3.8–10.8)

## 2018-11-01 NOTE — Progress Notes (Signed)
WBC count is low but trending up.  We will continue to monitor CBC every 6 months.   Glucose is 113.

## 2018-11-14 ENCOUNTER — Other Ambulatory Visit: Payer: Self-pay | Admitting: Rheumatology

## 2018-11-14 DIAGNOSIS — M0579 Rheumatoid arthritis with rheumatoid factor of multiple sites without organ or systems involvement: Secondary | ICD-10-CM

## 2018-11-15 NOTE — Telephone Encounter (Signed)
LMOM for patient to call and schedule follow-up appointment.   °

## 2018-11-15 NOTE — Telephone Encounter (Signed)
Last Visit: 10/31/18 Next Visit due June 2020. Message sent to the front to schedule Labs:  10/31/18 WBC count is low. Glucose is 113.  TB gold: 05/24/18 Neg   Okay to refill per Dr. Estanislado Pandy

## 2018-11-15 NOTE — Telephone Encounter (Signed)
Please schedule patient for a follow up visit. Patient due June 2020. Thanks! 

## 2018-11-26 MED FILL — ORENCIA CLICKJECT 125 MG/ML: 125 | 28 days supply | Qty: 4 | Fill #0

## 2018-12-12 ENCOUNTER — Other Ambulatory Visit: Payer: Self-pay | Admitting: *Deleted

## 2018-12-12 DIAGNOSIS — Z79899 Other long term (current) drug therapy: Secondary | ICD-10-CM

## 2018-12-13 DIAGNOSIS — Z79899 Other long term (current) drug therapy: Secondary | ICD-10-CM | POA: Diagnosis not present

## 2018-12-14 LAB — CBC WITH DIFFERENTIAL/PLATELET
Absolute Monocytes: 438 cells/uL (ref 200–950)
Basophils Absolute: 20 cells/uL (ref 0–200)
Basophils Relative: 0.9 %
Eosinophils Absolute: 62 cells/uL (ref 15–500)
Eosinophils Relative: 2.8 %
HCT: 38.9 % (ref 38.5–50.0)
Hemoglobin: 14 g/dL (ref 13.2–17.1)
Lymphs Abs: 774 cells/uL — ABNORMAL LOW (ref 850–3900)
MCH: 34.1 pg — ABNORMAL HIGH (ref 27.0–33.0)
MCHC: 36 g/dL (ref 32.0–36.0)
MCV: 94.9 fL (ref 80.0–100.0)
MPV: 9.6 fL (ref 7.5–12.5)
Monocytes Relative: 19.9 %
Neutro Abs: 906 cells/uL — ABNORMAL LOW (ref 1500–7800)
Neutrophils Relative %: 41.2 %
Platelets: 158 10*3/uL (ref 140–400)
RBC: 4.1 10*6/uL — ABNORMAL LOW (ref 4.20–5.80)
RDW: 13.1 % (ref 11.0–15.0)
Total Lymphocyte: 35.2 %
WBC: 2.2 10*3/uL — ABNORMAL LOW (ref 3.8–10.8)

## 2018-12-14 LAB — COMPLETE METABOLIC PANEL WITH GFR
AG Ratio: 1.3 (calc) (ref 1.0–2.5)
ALT: 38 U/L (ref 9–46)
AST: 36 U/L — ABNORMAL HIGH (ref 10–35)
Albumin: 3.9 g/dL (ref 3.6–5.1)
Alkaline phosphatase (APISO): 51 U/L (ref 35–144)
BUN: 11 mg/dL (ref 7–25)
CO2: 27 mmol/L (ref 20–32)
Calcium: 8.7 mg/dL (ref 8.6–10.3)
Chloride: 104 mmol/L (ref 98–110)
Creat: 0.91 mg/dL (ref 0.70–1.33)
GFR, Est African American: 107 mL/min/{1.73_m2} (ref >=60)
GFR, Est Non African American: 93 mL/min/{1.73_m2} (ref >=60)
Globulin: 3.1 g/dL (calc) (ref 1.9–3.7)
Glucose, Bld: 103 mg/dL — ABNORMAL HIGH (ref 65–99)
Potassium: 4.2 mmol/L (ref 3.5–5.3)
Sodium: 140 mmol/L (ref 135–146)
Total Bilirubin: 1 mg/dL (ref 0.2–1.2)
Total Protein: 7 g/dL (ref 6.1–8.1)

## 2018-12-16 NOTE — Progress Notes (Signed)
WBC count is low and is trending down.  Discussed with Dr. Estanislado Pandy and she would the patient to space Orencia sq injections to every 10 days.  We will recheck CBC in 1 month.  AST borderline elevated. Please advise patient to avoid tylenol, alcohol, and NSAIDs.

## 2018-12-17 ENCOUNTER — Other Ambulatory Visit: Payer: Self-pay

## 2018-12-17 DIAGNOSIS — Z79899 Other long term (current) drug therapy: Secondary | ICD-10-CM

## 2018-12-19 MED FILL — ORENCIA CLICKJECT 125 MG/ML: 125 | 28 days supply | Qty: 4 | Fill #1

## 2019-01-20 MED FILL — ORENCIA CLICKJECT 125 MG/ML: 125 | 28 days supply | Qty: 4 | Fill #2

## 2019-03-12 ENCOUNTER — Other Ambulatory Visit: Payer: Self-pay | Admitting: Rheumatology

## 2019-03-12 DIAGNOSIS — M0579 Rheumatoid arthritis with rheumatoid factor of multiple sites without organ or systems involvement: Secondary | ICD-10-CM

## 2019-03-12 NOTE — Telephone Encounter (Signed)
Please schedule patient for a follow up visit. Patient was due June 2020. Thanks! 

## 2019-03-12 NOTE — Telephone Encounter (Signed)
Last Visit: 10/31/18 Next Visit due June 2020. Message sent to the front to schedule Labs: 12/13/18 WBC count is low and is trending down. AST borderline elevated.  TB Gold: 05/24/18 neg   Okay to refill per Dr. Estanislado Pandy

## 2019-03-14 NOTE — Telephone Encounter (Signed)
I LMOM for patient to call, and schedule a rov with Hazel Sams, PAC.

## 2019-03-18 NOTE — Progress Notes (Signed)
Office Visit Note  Patient: Daniel Griffin             Date of Birth: [birth year]           MRN: [id number]             PCP: Default, Provider, MD Referring: No ref. provider found Visit Date: 03/31/2019 Occupation: @GUAROCC @  Subjective:  Medication monitoring    History of Present Illness: Daniel Griffin is a 58 y.o. male with history of seropositive rheumatoid arthritis and osteoarthritis.  He is on Orencia 125 mg sq injections every 10 days. He has not had any recent flares.  He has no joint pain or joint swelling.  He has morning stiffness for about 15 minutes daily.  He has not had any recent infections.  He has no other concerns at this time.   Activities of Daily Living:  Patient reports morning stiffness for 15 minutes.   Patient Denies nocturnal pain.  Difficulty dressing/grooming: Denies Difficulty climbing stairs: Denies Difficulty getting out of chair: Denies Difficulty using hands for taps, buttons, cutlery, and/or writing: Denies  Review of Systems  Constitutional: Negative for fatigue and night sweats.  HENT: Negative for mouth sores, mouth dryness and nose dryness.   Eyes: Negative for redness and dryness.  Respiratory: Negative for cough, hemoptysis, shortness of breath and difficulty breathing.   Cardiovascular: Negative for chest pain, palpitations, hypertension, irregular heartbeat and swelling in legs/feet.  Gastrointestinal: Negative for blood in stool, constipation and diarrhea.  Endocrine: Negative for increased urination.  Genitourinary: Negative for painful urination.  Musculoskeletal: Positive for morning stiffness. Negative for arthralgias, joint pain, joint swelling, myalgias, muscle weakness, muscle tenderness and myalgias.  Skin: Negative for color change, rash, hair loss, nodules/bumps, skin tightness, ulcers and sensitivity to sunlight.  Allergic/Immunologic: Negative for susceptible to infections.  Neurological: Negative for dizziness,  fainting, memory loss, night sweats and weakness.  Hematological: Negative for swollen glands.  Psychiatric/Behavioral: Negative for depressed mood and sleep disturbance. The patient is not nervous/anxious.     PMFS History:  Patient Active Problem List   Diagnosis Date Noted  . Transaminitis 07/04/2018  . Leukopenia 05/22/2018  . Rheumatoid arthritis involving multiple sites with positive rheumatoid factor ([place]) 11/30/2016  . High risk medications (not anticoagulants) long-term use 11/30/2016  . Pain in both hands 11/30/2016  . Pain in joints of both feet 11/30/2016    Past Medical History:  Diagnosis Date  . Arthritis     Family History  Problem Relation Age of Onset  . Alzheimer's disease Father   . Healthy Son   . Healthy Daughter   . Rheum arthritis Daughter   . Healthy Daughter    Past Surgical History:  Procedure Laterality Date  . APPENDECTOMY     Social History   Social History Narrative  . Not on file   Immunization History  Administered Date(s) Administered  . Influenza,inj,Quad PF,6+ Mos 07/04/2018     Objective: Vital Signs: BP (!) 167/87 (BP Location: Right Arm, Patient Position: Sitting, Cuff Size: Normal)   Pulse 72   Resp 15   Ht 5\' 6"  (1.676 m)   Wt 224 lb 9.6 oz (101.9 kg)   BMI 36.25 kg/m    Physical Exam Vitals signs and nursing note reviewed.  Constitutional:      Appearance: He is well-developed.  HENT:     Head: Normocephalic and atraumatic.  Eyes:     Conjunctiva/sclera: Conjunctivae normal.     Pupils: Pupils are  equal, round, and reactive to light.  Neck:     Musculoskeletal: Normal range of motion and neck supple.  Cardiovascular:     Rate and Rhythm: Normal rate and regular rhythm.     Heart sounds: Normal heart sounds.  Pulmonary:     Effort: Pulmonary effort is normal.     Breath sounds: Normal breath sounds.  Abdominal:     General: Bowel sounds are normal.     Palpations: Abdomen is soft.  Skin:    General: Skin  is warm and dry.     Capillary Refill: Capillary refill takes less than 2 seconds.  Neurological:     Mental Status: He is alert and oriented to person, place, and time.  Psychiatric:        Behavior: Behavior normal.      Musculoskeletal Exam: C-spine, thoracic spine, and lumbar spine good ROM.  No midline spinal tenderness.  No SI joint tenderness. Shoulder joints, wrist joints, MCPs, PIPs, and DIPs good ROM with no synovitis.  Elbow joint contractures bilaterally. DIP synovial thickening.  Hip joints, knee joints, ankle joints, MTPs, PIPs, and DIPs good ROM.  No warmth or effusion of knee joints.  No tenderness or swelling of ankle joints.   CDAI Exam: CDAI Score: - Patient Global: -; Provider Global: - Swollen: -; Tender: - Joint Exam   No joint exam has been documented for this visit   There is currently no information documented on the homunculus. Go to the Rheumatology activity and complete the homunculus joint exam.  Investigation: No additional findings.  Imaging: No results found.  Recent Labs: Lab Results  Component Value Date   WBC 2.2 (L) 12/13/2018   HGB 14.0 12/13/2018   PLT 158 12/13/2018   NA 140 12/13/2018   K 4.2 12/13/2018   CL 104 12/13/2018   CO2 27 12/13/2018   GLUCOSE 103 (H) 12/13/2018   BUN 11 12/13/2018   CREATININE 0.91 12/13/2018   BILITOT 1.0 12/13/2018   ALKPHOS 59 07/04/2018   AST 36 (H) 12/13/2018   ALT 38 12/13/2018   PROT 7.0 12/13/2018   ALBUMIN 3.3 (L) 07/04/2018   CALCIUM 8.7 12/13/2018   GFRAA 107 12/13/2018   QFTBGOLD NEGATIVE 07/03/2017   QFTBGOLDPLUS Negative 05/24/2018    Speciality Comments: Prior therapy includes: Enbrel (pancytopenia) and methotrexate (inadequate response)  Procedures:  No procedures performed Allergies: Patient has no known allergies.   Assessment / Plan:     Visit Diagnoses: Rheumatoid arthritis involving multiple sites with positive rheumatoid factor ([place]) - He has no synovitis on exam.  He  has not had any recent rheumatoid arthritis flares.  He is clinically doing well on Orencia 125 mg sq injection every 10 days.  He has no joint pain or joint swelling at this time.  He experiences morning stiffness for 15 minutes daily.  He has bilateral elbow joint contractures but no tenderness or inflammation was noted.  He will continue on this current treatment regimen.  He does not need any refills at this time.  He was advised to notify us if he develops increased joint pain or joint swelling.  He will follow up in 5 months.   High risk medications (not anticoagulants) long-term use -Orencia 125 mg every 10 days (started on 07/09/18).  Last TB gold negative on 05/24/2018 and will monitor yearly. Future order for TB gold placed.  Most recent CBC/CMP within normal limits except for low WBC count on 12/13/2018.  He was to return in  1 month to recheck CBC.  Due for CBC/CMP today and will monitor every 3 months.   Standing orders placed. He received the flu vaccine in November 2019.  He was advised to hold Orencia if he develops any signs or symptoms of an infection and to resume once the infection has cleared. He previously discontinued Enbrel due to neutropenia.- Plan: CBC with Differential/Platelet, COMPLETE METABOLIC PANEL WITH GFR, QuantiFERON-TB Gold Plus  Primary osteoarthritis of both hands -He has DIP synovial thickening.  He has no tenderness or inflammation.  He has complete fist formation bilaterally.  Joint protection and muscle strengthening were discussed.   Primary osteoarthritis of both feet - He has no feet pain or joint swelling at this time.    Orders: Orders Placed This Encounter  Procedures  . CBC with Differential/Platelet  . COMPLETE METABOLIC PANEL WITH GFR  . QuantiFERON-TB Gold Plus   No orders of the defined types were placed in this encounter.     Follow-Up Instructions: Return in about 5 months (around 08/31/2019) for Rheumatoid arthritis, Osteoarthritis.   Ofilia Neas, PA-C   I examined and evaluated the patient with Hazel Sams PA.  Patient had no synovitis on my examination today.  He has been taking Orencia subcu which she has been tolerating well.  He continues to have some stiffness and discomfort due to underlying osteoarthritis.  Joint protection was discussed.  The plan of care was discussed as noted above.  Bo Merino, MD  Note - This record has been created using Editor, commissioning.  Chart creation errors have been sought, but may not always  have been located. Such creation errors do not reflect on  the standard of medical care.

## 2019-03-19 MED FILL — ORENCIA CLICKJECT 125 MG/ML: 125 | 28 days supply | Qty: 4 | Fill #0

## 2019-03-31 ENCOUNTER — Encounter: Payer: Self-pay | Admitting: Physician Assistant

## 2019-03-31 ENCOUNTER — Other Ambulatory Visit: Payer: Self-pay

## 2019-03-31 ENCOUNTER — Ambulatory Visit (INDEPENDENT_AMBULATORY_CARE_PROVIDER_SITE_OTHER): Payer: BC Managed Care – PPO | Admitting: Rheumatology

## 2019-03-31 VITALS — BP 167/87 | HR 72 | Resp 15 | Ht 66.0 in | Wt 224.6 lb

## 2019-03-31 DIAGNOSIS — Z79899 Other long term (current) drug therapy: Secondary | ICD-10-CM

## 2019-03-31 DIAGNOSIS — M19041 Primary osteoarthritis, right hand: Secondary | ICD-10-CM | POA: Diagnosis not present

## 2019-03-31 DIAGNOSIS — M19072 Primary osteoarthritis, left ankle and foot: Secondary | ICD-10-CM

## 2019-03-31 DIAGNOSIS — M0579 Rheumatoid arthritis with rheumatoid factor of multiple sites without organ or systems involvement: Secondary | ICD-10-CM | POA: Diagnosis not present

## 2019-03-31 DIAGNOSIS — M19071 Primary osteoarthritis, right ankle and foot: Secondary | ICD-10-CM | POA: Diagnosis not present

## 2019-03-31 DIAGNOSIS — M19042 Primary osteoarthritis, left hand: Secondary | ICD-10-CM

## 2019-04-01 LAB — COMPLETE METABOLIC PANEL WITH GFR
AG Ratio: 1.4 (calc) (ref 1.0–2.5)
ALT: 34 U/L (ref 9–46)
AST: 35 U/L (ref 10–35)
Albumin: 3.9 g/dL (ref 3.6–5.1)
Alkaline phosphatase (APISO): 46 U/L (ref 35–144)
BUN: 9 mg/dL (ref 7–25)
CO2: 28 mmol/L (ref 20–32)
Calcium: 8.4 mg/dL — ABNORMAL LOW (ref 8.6–10.3)
Chloride: 105 mmol/L (ref 98–110)
Creat: 0.86 mg/dL (ref 0.70–1.33)
GFR, Est African American: 111 mL/min/{1.73_m2} (ref >=60)
GFR, Est Non African American: 96 mL/min/{1.73_m2} (ref >=60)
Globulin: 2.7 g/dL (calc) (ref 1.9–3.7)
Glucose, Bld: 98 mg/dL (ref 65–99)
Potassium: 4.2 mmol/L (ref 3.5–5.3)
Sodium: 141 mmol/L (ref 135–146)
Total Bilirubin: 0.8 mg/dL (ref 0.2–1.2)
Total Protein: 6.6 g/dL (ref 6.1–8.1)

## 2019-04-01 LAB — CBC WITH DIFFERENTIAL/PLATELET
Absolute Monocytes: 374 cells/uL (ref 200–950)
Basophils Absolute: 31 cells/uL (ref 0–200)
Basophils Relative: 0.9 %
Eosinophils Absolute: 119 cells/uL (ref 15–500)
Eosinophils Relative: 3.5 %
HCT: 39 % (ref 38.5–50.0)
Hemoglobin: 13.6 g/dL (ref 13.2–17.1)
Lymphs Abs: 643 cells/uL — ABNORMAL LOW (ref 850–3900)
MCH: 34.2 pg — ABNORMAL HIGH (ref 27.0–33.0)
MCHC: 34.9 g/dL (ref 32.0–36.0)
MCV: 98 fL (ref 80.0–100.0)
MPV: 9.7 fL (ref 7.5–12.5)
Monocytes Relative: 11 %
Neutro Abs: 2234 cells/uL (ref 1500–7800)
Neutrophils Relative %: 65.7 %
Platelets: 153 10*3/uL (ref 140–400)
RBC: 3.98 10*6/uL — ABNORMAL LOW (ref 4.20–5.80)
RDW: 13 % (ref 11.0–15.0)
Total Lymphocyte: 18.9 %
WBC: 3.4 10*3/uL — ABNORMAL LOW (ref 3.8–10.8)

## 2019-04-01 NOTE — Progress Notes (Signed)
WBC count is mildly low but trending up.  RBC count is low.  We will continue to monitor.  Calcium is low. Please recommend obtaining 1200 mg of calcium in his diet daily.

## 2019-04-14 ENCOUNTER — Other Ambulatory Visit: Payer: Self-pay | Admitting: Rheumatology

## 2019-04-14 DIAGNOSIS — M0579 Rheumatoid arthritis with rheumatoid factor of multiple sites without organ or systems involvement: Secondary | ICD-10-CM

## 2019-04-14 MED ORDER — ORENCIA CLICKJECT 125 MG/ML ~~LOC~~ SOAJ: 125.0000 mg | SUBCUTANEOUS | 0 refills | Status: DC

## 2019-04-14 NOTE — Telephone Encounter (Signed)
Please schedule patient a follow up appointment. Patient due January 2021. Thanks!

## 2019-04-14 NOTE — Telephone Encounter (Signed)
Last Visit: 03/31/19 Next Visit: due January 2021. Message sent to the front to schedule patients.  Labs: 03/31/19 WBC count is mildly low but trending up. RBC count is low. We will continue to monitor.  Calcium is low. TB Gold: 05/24/18 Neg   Okay to refill per Dr. Estanislado Pandy

## 2019-04-15 ENCOUNTER — Emergency Department (HOSPITAL_BASED_OUTPATIENT_CLINIC_OR_DEPARTMENT_OTHER): Payer: BC Managed Care – PPO

## 2019-04-15 ENCOUNTER — Other Ambulatory Visit: Payer: Self-pay

## 2019-04-15 ENCOUNTER — Emergency Department (HOSPITAL_BASED_OUTPATIENT_CLINIC_OR_DEPARTMENT_OTHER)
Admission: EM | Admit: 2019-04-15 | Discharge: 2019-04-16 | Disposition: A | Discharge status: Home / Self Care | Payer: BC Managed Care – PPO | Attending: Emergency Medicine | Admitting: Emergency Medicine

## 2019-04-15 ENCOUNTER — Encounter (HOSPITAL_BASED_OUTPATIENT_CLINIC_OR_DEPARTMENT_OTHER): Payer: Self-pay | Admitting: *Deleted

## 2019-04-15 DIAGNOSIS — X500XXA Overexertion from strenuous movement or load, initial encounter: Secondary | ICD-10-CM | POA: Insufficient documentation

## 2019-04-15 DIAGNOSIS — S8992XA Unspecified injury of left lower leg, initial encounter: Secondary | ICD-10-CM | POA: Insufficient documentation

## 2019-04-15 DIAGNOSIS — Y9289 Other specified places as the place of occurrence of the external cause: Secondary | ICD-10-CM | POA: Diagnosis not present

## 2019-04-15 DIAGNOSIS — M25562 Pain in left knee: Secondary | ICD-10-CM | POA: Diagnosis not present

## 2019-04-15 DIAGNOSIS — Y998 Other external cause status: Secondary | ICD-10-CM | POA: Diagnosis not present

## 2019-04-15 DIAGNOSIS — Y9389 Activity, other specified: Secondary | ICD-10-CM | POA: Insufficient documentation

## 2019-04-15 MED ORDER — METHOCARBAMOL 500 MG PO TABS: 500.0000 mg | ORAL_TABLET | Freq: Two times a day (BID) | ORAL | 0 refills | Status: DC

## 2019-04-15 MED ORDER — HYDROCODONE-ACETAMINOPHEN 5-325 MG PO TABS
1.0000 | ORAL_TABLET | Freq: Once | ORAL | Status: AC
  Administered 2019-04-15: 23:00:00 1 via ORAL
  Filled 2019-04-15: qty 1

## 2019-04-15 MED ORDER — DICLOFENAC SODIUM 1 % TD GEL: 4.0000 g | Freq: Four times a day (QID) | TRANSDERMAL | 0 refills | Status: DC

## 2019-04-15 MED ORDER — LIDOCAINE 5 % EX PTCH: 1.0000 | MEDICATED_PATCH | CUTANEOUS | 0 refills | Status: DC

## 2019-04-15 NOTE — ED Provider Notes (Addendum)
[facility] EMERGENCY DEPARTMENT Provider Note   CSN: [id number] Arrival date & time: 04/15/19  2206     History   Chief Complaint Chief Complaint  Patient presents with  . Leg Pain    HPI Daniel Griffin is a 58 y.o. male.     The history is provided by the patient.  Knee Pain Location:  Knee Time since incident:  11 hours Injury: yes   Mechanism of injury comment:  Getting off tractor and heard a pop.   Knee location:  L knee Pain details:    Radiates to:  Does not radiate   Severity:  Severe   Onset quality:  Sudden   Duration:  11 hours   Timing:  Constant   Progression:  Unchanged Chronicity:  New Dislocation: no   Foreign body present:  No foreign bodies Prior injury to area:  No Relieved by:  Nothing Worsened by:  Nothing Ineffective treatments:  None tried Associated symptoms: no back pain, no decreased ROM, no fatigue, no fever, no itching, no muscle weakness, no neck pain, no numbness, no stiffness, no swelling and no tingling   Risk factors: no concern for non-accidental trauma   Came down off his trailer this afternoon and heard a pop.  He did not fall did not stroke head.  No LOC.  States he took 100 mg of ultram of his home ultram without relief.  Hurts to straight the knee. No bruising or swelling.   Past Medical History:  Diagnosis Date  . Arthritis     Patient Active Problem List   Diagnosis Date Noted  . Transaminitis 07/04/2018  . Leukopenia 05/22/2018  . Rheumatoid arthritis involving multiple sites with positive rheumatoid factor ([place]) 11/30/2016  . High risk medications (not anticoagulants) long-term use 11/30/2016  . Pain in both hands 11/30/2016  . Pain in joints of both feet 11/30/2016    Past Surgical History:  Procedure Laterality Date  . APPENDECTOMY          Home Medications    Prior to Admission medications   Medication Sig Start Date End Date Taking? Authorizing Provider  Abatacept (ORENCIA CLICKJECT)  0000000 MG/ML SOAJ Inject 125 mg into the skin once a week. Inject 125 mg into skin once every 10 days. 04/14/19   Bo Merino, MD  diclofenac sodium (VOLTAREN) 1 % GEL Apply 4 g topically 4 (four) times daily. 04/15/19   Sophiana Milanese, MD  lidocaine (LIDODERM) 5 % Place 1 patch onto the skin daily. Remove & Discard patch within 12 hours or as directed by MD 04/15/19   Randal Buba, Nijel Flink, MD  [facility] CLICKJECT 0000000 MG/ML SOAJ INJECT 125MG  INTO THE SKIN ONCE A WEEK Patient taking differently: Inject 125 mg every 10 days. 03/12/19   Bo Merino, MD    Family History Family History  Problem Relation Age of Onset  . Alzheimer's disease Father   . Healthy Son   . Healthy Daughter   . Rheum arthritis Daughter   . Healthy Daughter     Social History Social History   Tobacco Use  . Smoking status: Never Smoker  . Smokeless tobacco: Current User    Types: Chew, Snuff  Substance Use Topics  . Alcohol use: Yes    Comment: occ  . Drug use: No     Allergies   Patient has no known allergies.   Review of Systems Review of Systems  Constitutional: Negative for fatigue and fever.  HENT: Negative for congestion.  Respiratory: Negative for shortness of breath.   Cardiovascular: Negative for chest pain.  Gastrointestinal: Negative for abdominal pain.  Genitourinary: Negative for difficulty urinating.  Musculoskeletal: Positive for arthralgias. Negative for back pain, joint swelling, neck pain and stiffness.  Skin: Negative for itching.  Neurological: Negative for weakness.  Psychiatric/Behavioral: Negative for agitation.  All other systems reviewed and are negative.    Physical Exam Updated Vital Signs BP (!) 143/82   Pulse 81   Temp 99.7 F (37.6 C) (Oral)   Resp 20   Ht 5\' 6"  (1.676 m)   Wt 95.3 kg   SpO2 98%   BMI 33.89 kg/m   Physical Exam Vitals signs and nursing note reviewed.  Constitutional:      General: He is not in acute distress.    Appearance: He is  normal weight.  HENT:     Head: Normocephalic and atraumatic.     Nose: Nose normal.  Eyes:     Conjunctiva/sclera: Conjunctivae normal.     Pupils: Pupils are equal, round, and reactive to light.  Neck:     Musculoskeletal: Normal range of motion and neck supple.  Cardiovascular:     Rate and Rhythm: Normal rate and regular rhythm.     Pulses: Normal pulses.     Heart sounds: Normal heart sounds.  Pulmonary:     Effort: Pulmonary effort is normal.     Breath sounds: Normal breath sounds.  Abdominal:     General: Abdomen is flat. Bowel sounds are normal.     Tenderness: There is no abdominal tenderness. There is no guarding.  Musculoskeletal:        General: No swelling or deformity.     Left hip: Normal.     Right knee: Normal.     Left knee: He exhibits no swelling, no effusion, no ecchymosis, no deformity, no laceration, no erythema, normal alignment, no LCL laxity, normal patellar mobility, no bony tenderness, normal meniscus and no MCL laxity. No medial joint line, no lateral joint line and no patellar tendon tenderness noted.     Right ankle: Normal. Achilles tendon normal.     Left ankle: Normal. Achilles tendon normal.     Left upper leg: Normal.     Right lower leg: No edema.     Left lower leg: No edema.     Left foot: Normal.     Comments: Negative anterior and posterior drawer test of the left knee.  No laxity of the Left knee to varus or valgus stress.  No patella al[place] or [place].    Skin:    General: Skin is warm and dry.     Capillary Refill: Capillary refill takes less than 2 seconds.  Neurological:     General: No focal deficit present.     Mental Status: He is alert and oriented to person, place, and time.  Psychiatric:        Mood and Affect: Mood normal.        Behavior: Behavior normal.      ED Treatments / Results   Radiology Dg Knee Complete 4 Views Left  Result Date: 04/15/2019 CLINICAL DATA:  Left knee pain, injury EXAM: LEFT KNEE - COMPLETE 4+  VIEW COMPARISON:  None. FINDINGS: Old healed proximal fibular shaft fracture. Mild joint space narrowing and spurring in the medial and patellofemoral compartments. No acute bony abnormality. Specifically, no fracture, subluxation, or dislocation. No joint effusion IMPRESSION: No acute bony abnormality. Electronically Signed   By: Lennette Bihari  [place] M.D.   On: 04/15/2019 23:17    Procedures Procedures (including critical care time)  Medications Ordered in ED Medications  HYDROcodone-acetaminophen (NORCO/VICODIN) 5-325 MG per tablet 1 tablet (1 tablet Oral Given 04/15/19 2232)    Patient and daughter have refused PO and IM NSAIDs due to patient's RA and white count which was 3.4. Daughter is angry that the patient is not getting advanced imaging or being seen by orthopedics in the ED.  EDP explained this was not available here.    I believe NSAIDs would be best for patient but family declines so none were given.  The patient received Norco in the ED.    I have informed the patient and his daughter there are no fractures, dislocations or effusions on XR but it is imperative that the patient follow up with orthopedics for further examination and testing.  The daughter is stating that I said the Xray was normal.  I repeated that there are no fractures or dislocations or effusions but it is imperative that the patient follow up with orthopedics for further examination as Xray does not demonstrate meniscal injuries or ligamentous injury.  Orthopedic contact information is provided on the discharge paperwork.    Patient took 100 mg ultram at home and then received vicodin here and daughter and patient state that is not enough for his pain.  There are no objective signs trauma and knee exam is normal at this time.  The knee is stable at this time.  I have prescribed numbing patches, muscle relaxant, and local pain reliever.    Daughter and patient do not want the immobilizer stating it is making the pain worse  and the daughter has removed the immobilizer.  Crutches were prescribed but daughter is refusing those stating she has them in the car.    Suspect drug seeking on the part of the daughter Final Clinical Impressions(s) / ED Diagnoses   Final diagnoses:  Injury of left knee, initial encounter   Return for intractable cough, coughing up blood,fevers >100.4 unrelieved by medication, shortness of breath, intractable vomiting, chest pain, shortness of breath, weakness,numbness, changes in speech, facial asymmetry,abdominal pain, passing out,Inability to tolerate liquids or food, cough, altered mental status or any concerns. No signs of systemic illness or infection. The patient is nontoxic-appearing on exam and vital signs are within normal limits.   I have reviewed the triage vital signs and the nursing notes. Pertinent labs &imaging results that were available during my care of the patient were reviewed by me and considered in my medical decision making (see chart for details).  After history, exam, and medical workup I feel the patient has been appropriately medically screened and is safe for discharge home. Pertinent diagnoses were discussed with the patient. Patient was given return precautions ED Discharge Orders         Ordered    diclofenac sodium (VOLTAREN) 1 % GEL  4 times daily     04/15/19 2323    lidocaine (LIDODERM) 5 %  Every 24 hours     04/15/19 2323                 Khamari Yousuf, MD 04/16/19 2315

## 2019-04-15 NOTE — Telephone Encounter (Signed)
LMOM for patient to call and schedule follow-up appointment.   °

## 2019-04-15 NOTE — ED Triage Notes (Signed)
He was getting off his tractor this afternoon and felt a pop in the left lateral patella. Unable to straighten his leg out or stand.

## 2019-04-16 ENCOUNTER — Ambulatory Visit (INDEPENDENT_AMBULATORY_CARE_PROVIDER_SITE_OTHER): Payer: BC Managed Care – PPO | Admitting: Orthopaedic Surgery

## 2019-04-16 ENCOUNTER — Encounter (HOSPITAL_BASED_OUTPATIENT_CLINIC_OR_DEPARTMENT_OTHER): Payer: Self-pay | Admitting: Emergency Medicine

## 2019-04-16 ENCOUNTER — Encounter: Payer: Self-pay | Admitting: Orthopaedic Surgery

## 2019-04-16 DIAGNOSIS — M25562 Pain in left knee: Secondary | ICD-10-CM | POA: Insufficient documentation

## 2019-04-16 MED ORDER — BUPIVACAINE HCL 0.5 % IJ SOLN
2.0000 mL | INTRAMUSCULAR | Status: AC | PRN
  Administered 2019-04-16: 14:00:00 2 mL via INTRA_ARTICULAR

## 2019-04-16 MED ORDER — LIDOCAINE HCL 1 % IJ SOLN
2.0000 mL | INTRAMUSCULAR | Status: AC | PRN
  Administered 2019-04-16: 14:00:00 2 mL

## 2019-04-16 MED ORDER — METHYLPREDNISOLONE ACETATE 40 MG/ML IJ SUSP
80.0000 mg | INTRAMUSCULAR | Status: AC | PRN
  Administered 2019-04-16: 14:00:00 80 mg via INTRA_ARTICULAR

## 2019-04-16 MED FILL — METHOCARBAMOL 500 MG TABS: 500 | 5 days supply | Qty: 10 | Fill #0

## 2019-04-16 NOTE — ED Notes (Signed)
Pt has crutches in car.

## 2019-04-16 NOTE — Progress Notes (Signed)
Office Visit Note   Patient: Daniel Griffin           Date of Birth: 1961-02-01           MRN: [id number] Visit Date: 04/16/2019              Requested by: No referring provider defined for this encounter. PCP: Default, Provider, MD   Assessment & Plan: Visit Diagnoses:  1. Acute pain of left knee     Plan: Acute onset of left knee pain yesterday stepping off a tractor.  He did not fall.  Does have history of rheumatoid arthritis.  Was seen at [facility] emergency room on [address] with negative films.  He follows up today with a large effusion.  I aspirated 90 cc of minimally cloudy yellow fluid.  No hemarthrosis.  I injected cortisone and felt much better.  Will reevaluate in the next several weeks if no improvement.  I did not send the fluid for analysis but would reconsider aspiration if no improvement in the next several weeks.  I think the problem is all related to his arthritis without acute injury Follow-Up Instructions: Return in about 2 weeks (around 04/30/2019).   Orders:  Orders Placed This Encounter  Procedures  . Large Joint Inj: L knee   No orders of the defined types were placed in this encounter.     Procedures: Large Joint Inj: L knee on 04/16/2019 2:13 PM Indications: pain and diagnostic evaluation Details: 25 G 1.5 in needle, anteromedial approach  Arthrogram: No  Medications: 2 mL lidocaine 1 %; 2 mL bupivacaine 0.5 %; 80 mg methylPREDNISolone acetate 40 MG/ML Aspirate: 90 mL yellow Procedure, treatment alternatives, risks and benefits explained, specific risks discussed. Consent was given by the patient. Patient was prepped and draped in the usual sterile fashion.       Clinical Data: No additional findings.   Subjective: Chief Complaint  Patient presents with  . Left Knee - Pain    DOI 04/15/2019  Patient presents today for left knee pain. He was stepped off a tractor and slipped on the mowing deck. He stepped down on the left leg and felt  something pop. He has been having pain posteriorly and laterally, with mild swelling. He went to the ED on [address] last night and had x-rays taken. Patient has a history of RA.  I reviewed the films on the PACS system and did not see significant degenerative change about the patella and the medial compartment related to his rheumatoid arthritis and a combination of osteoarthritis.  No acute fracture old fracture of the proximal fibula, No history of gout.   HPI  Review of Systems   Objective: Vital Signs: BP (!) 148/86   Pulse 67   Ht 5' 6.5" (1.689 m)   Wt 212 lb (96.2 kg)   BMI 33.71 kg/m   Physical Exam Constitutional:      Appearance: He is well-developed.  Eyes:     Pupils: Pupils are equal, round, and reactive to light.  Pulmonary:     Effort: Pulmonary effort is normal.  Skin:    General: Skin is warm and dry.  Neurological:     Mental Status: He is alert and oriented to person, place, and time.  Psychiatric:        Behavior: Behavior normal.     Ortho Exam left knee with a very large effusion or hemarthrosis.  Some mild medial lateral joint pain and patella patella discomfort.  Lacks full extension probably on the basis of the effusion and only had about 95 degrees of flexion.  Skin was intact.  After aspiration he had full extension and over 105 degrees of flexion without instability  Specialty Comments:  No specialty comments available.  Imaging: Dg Knee Complete 4 Views Left  Result Date: 04/15/2019 CLINICAL DATA:  Left knee pain, injury EXAM: LEFT KNEE - COMPLETE 4+ VIEW COMPARISON:  None. FINDINGS: Old healed proximal fibular shaft fracture. Mild joint space narrowing and spurring in the medial and patellofemoral compartments. No acute bony abnormality. Specifically, no fracture, subluxation, or dislocation. No joint effusion IMPRESSION: No acute bony abnormality. Electronically Signed   By: Rolm Baptise M.D.   On: 04/15/2019 23:17     PMFS History:  Patient Active Problem List   Diagnosis Date Noted  . Pain in left knee 04/16/2019  . Transaminitis 07/04/2018  . Leukopenia 05/22/2018  . Rheumatoid arthritis involving multiple sites with positive rheumatoid factor ([place]) 11/30/2016  . High risk medications (not anticoagulants) long-term use 11/30/2016  . Pain in both hands 11/30/2016  . Pain in joints of both feet 11/30/2016   Past Medical History:  Diagnosis Date  . Arthritis     Family History  Problem Relation Age of Onset  . Alzheimer's disease Father   . Healthy Son   . Healthy Daughter   . Rheum arthritis Daughter   . Healthy Daughter     Past Surgical History:  Procedure Laterality Date  . APPENDECTOMY     Social History   Occupational History  . Not on file  Tobacco Use  . Smoking status: Never Smoker  . Smokeless tobacco: Current User    Types: Chew, Snuff  Substance and Sexual Activity  . Alcohol use: Yes    Comment: occ  . Drug use: No  . Sexual activity: Not on file

## 2019-04-21 MED FILL — ORENCIA CLICKJECT 125 MG/ML: 125 | 30 days supply | Qty: 3 | Fill #0

## 2019-06-02 MED FILL — ORENCIA CLICKJECT 125 MG/ML: 125 | 30 days supply | Qty: 3 | Fill #1

## 2019-07-07 MED FILL — ORENCIA CLICKJECT 125 MG/ML: 125 | 30 days supply | Qty: 3 | Fill #2

## 2019-07-31 ENCOUNTER — Other Ambulatory Visit: Payer: Self-pay | Admitting: Rheumatology

## 2019-08-01 NOTE — Telephone Encounter (Signed)
Last Visit: 03/31/2019 Next Visit: message sent to the front desk to schedule.  Labs: 03/31/2019 WBC count is mildly low but trending up. RBC count is low. We will continue to monitor.  Calcium is low. TB Gold: 05/24/2018 negative   Attempted to contact patient and left message on machine to advise patient he is over due to update labs (at a [facility] or [facility]). Advised patient to call with location.

## 2019-08-07 ENCOUNTER — Telehealth: Payer: Self-pay | Admitting: Rheumatology

## 2019-08-07 DIAGNOSIS — Z79899 Other long term (current) drug therapy: Secondary | ICD-10-CM

## 2019-08-07 NOTE — Telephone Encounter (Signed)
Patient going to [facility] in [place] for labs Friday morning. Please release orders.

## 2019-08-07 NOTE — Telephone Encounter (Signed)
Lab orders released for [facility].  

## 2019-08-07 NOTE — Addendum Note (Signed)
Addended by: Earnestine Mealing on: 08/07/2019 08:51 AM   Modules accepted: Orders

## 2019-08-08 DIAGNOSIS — Z79899 Other long term (current) drug therapy: Secondary | ICD-10-CM | POA: Diagnosis not present

## 2019-08-08 DIAGNOSIS — Z9225 Personal history of immunosupression therapy: Secondary | ICD-10-CM | POA: Diagnosis not present

## 2019-08-11 LAB — CMP14+EGFR
ALT: 26 IU/L (ref 0–44)
AST: 22 IU/L (ref 0–40)
Albumin/Globulin Ratio: 1.5 (ref 1.2–2.2)
Albumin: 4.5 g/dL (ref 3.8–4.9)
Alkaline Phosphatase: 77 IU/L (ref 39–117)
BUN/Creatinine Ratio: 10 (ref 9–20)
BUN: 8 mg/dL (ref 6–24)
Bilirubin Total: 1.1 mg/dL (ref 0.0–1.2)
CO2: 24 mmol/L (ref 20–29)
Calcium: 9.5 mg/dL (ref 8.7–10.2)
Chloride: 97 mmol/L (ref 96–106)
Creatinine, Ser: 0.79 mg/dL (ref 0.76–1.27)
GFR calc Af Amer: 114 mL/min/{1.73_m2} (ref >59)
GFR calc non Af Amer: 98 mL/min/{1.73_m2} (ref >59)
Globulin, Total: 3 g/dL (ref 1.5–4.5)
Glucose: 112 mg/dL — ABNORMAL HIGH (ref 65–99)
Potassium: 4.2 mmol/L (ref 3.5–5.2)
Sodium: 138 mmol/L (ref 134–144)
Total Protein: 7.5 g/dL (ref 6.0–8.5)

## 2019-08-11 LAB — CBC WITH DIFFERENTIAL/PLATELET
Basophils Absolute: 0 10*3/uL (ref 0.0–0.2)
Basos: 1 %
EOS (ABSOLUTE): 0.1 10*3/uL (ref 0.0–0.4)
Eos: 1 %
Hematocrit: 43.2 % (ref 37.5–51.0)
Hemoglobin: 15.2 g/dL (ref 13.0–17.7)
Immature Grans (Abs): 0 10*3/uL (ref 0.0–0.1)
Immature Granulocytes: 0 %
Lymphocytes Absolute: 1.5 10*3/uL (ref 0.7–3.1)
Lymphs: 28 %
MCH: 33.6 pg — ABNORMAL HIGH (ref 26.6–33.0)
MCHC: 35.2 g/dL (ref 31.5–35.7)
MCV: 96 fL (ref 79–97)
Monocytes Absolute: 0.6 10*3/uL (ref 0.1–0.9)
Monocytes: 12 %
Neutrophils Absolute: 3.3 10*3/uL (ref 1.4–7.0)
Neutrophils: 58 %
Platelets: 209 10*3/uL (ref 150–450)
RBC: 4.52 x10E6/uL (ref 4.14–5.80)
RDW: 11.5 % — ABNORMAL LOW (ref 11.6–15.4)
WBC: 5.6 10*3/uL (ref 3.4–10.8)

## 2019-08-11 LAB — QUANTIFERON-TB GOLD PLUS
QuantiFERON Mitogen Value: 2.47 IU/mL
QuantiFERON Nil Value: 0.64 IU/mL
QuantiFERON TB1 Ag Value: 0.91 IU/mL
QuantiFERON TB2 Ag Value: 0.76 IU/mL
QuantiFERON-TB Gold Plus: NEGATIVE

## 2019-08-11 NOTE — Telephone Encounter (Signed)
TB gold negative

## 2019-08-19 ENCOUNTER — Other Ambulatory Visit: Payer: Self-pay | Admitting: Rheumatology

## 2019-08-20 MED FILL — ORENCIA CLICKJECT 125 MG/ML: 125 | 30 days supply | Qty: 3 | Fill #0

## 2019-08-20 NOTE — Telephone Encounter (Signed)
Last Visit: 03/31/2019 Next Visit: message sent to the front desk to schedule.  Labs: 08/08/2019 Glucose is 112. Rest of CMP WNL. CBC stable.  TB Gold:  08/08/2019 negative   Okay to refill per Dr. Estanislado Pandy.

## 2019-09-18 MED FILL — ORENCIA CLICKJECT 125 MG/ML: 125 | 30 days supply | Qty: 3 | Fill #1

## 2019-10-08 NOTE — Progress Notes (Signed)
Office Visit Note  Patient: Daniel Griffin             Date of Birth: 1960/08/31           MRN: [id number]             PCP: Default, Provider, MD Referring: No ref. provider found Visit Date: 10/14/2019 Occupation: @GUAROCC @  Subjective:  Medication monitoring.   History of Present Illness: Daniel Griffin is a 59 y.o. male with history of rheumatoid arthritis.  He states he has been very active and has been chopping woods.  He notices increased pain and discomfort in his hands and feet.  Which he relates to his activities.  He states he is pleased where he is taking Orencia every 10 days. He is currently on Orencia Clickject every 10 days due to low WBC taking every 7 days. Denies missing any doses, side effects, or issues obtaining from the pharmacy. He did have one pen that malfunctioned but he contacted the pharmacy and they gave him a replacement.  Denies any recent infections or flares.  He feels like Maureen Chatters is working well but does notice an increase in joint pain of bilateral hands the day before his next injection is due.  Activities of Daily Living:  Patient reports morning stiffness for 30 minutes.   Patient Reports nocturnal pain.  Difficulty dressing/grooming: Reports Difficulty climbing stairs: Reports Difficulty getting out of chair: Denies Difficulty using hands for taps, buttons, cutlery, and/or writing: Reports  Review of Systems  Constitutional: Negative for fatigue.  HENT: Negative for mouth sores, mouth dryness and nose dryness.   Eyes: Negative for itching and dryness.  Respiratory: Negative for shortness of breath and difficulty breathing.   Cardiovascular: Negative for chest pain and palpitations.  Gastrointestinal: Negative for blood in stool, constipation and diarrhea.  Endocrine: Negative for increased urination.  Genitourinary: Negative for difficulty urinating and painful urination.  Musculoskeletal: Positive for arthralgias, joint pain, joint  swelling and morning stiffness.  Skin: Negative for rash.  Allergic/Immunologic: Negative for susceptible to infections.  Neurological: Negative for dizziness, headaches, memory loss and weakness.  Hematological: Positive for bruising/bleeding tendency.  Psychiatric/Behavioral: Negative for confusion and sleep disturbance.    PMFS History:  Patient Active Problem List   Diagnosis Date Noted  . Pain in left knee 04/16/2019  . Transaminitis 07/04/2018  . Leukopenia 05/22/2018  . Rheumatoid arthritis involving multiple sites with positive rheumatoid factor ([place]) 11/30/2016  . High risk medications (not anticoagulants) long-term use 11/30/2016  . Pain in both hands 11/30/2016  . Pain in joints of both feet 11/30/2016    Past Medical History:  Diagnosis Date  . Arthritis     Family History  Problem Relation Age of Onset  . Alzheimer's disease Father   . Healthy Son   . Healthy Daughter   . Rheum arthritis Daughter   . Healthy Daughter    Past Surgical History:  Procedure Laterality Date  . APPENDECTOMY     Social History   Social History Narrative  . Not on file   Immunization History  Administered Date(s) Administered  . Influenza,inj,Quad PF,6+ Mos 07/04/2018     Objective: Vital Signs: BP (!) 169/91 (BP Location: Right Arm, Patient Position: Sitting, Cuff Size: Normal)   Pulse 67   Resp 15   Ht 5\' 7"  (1.702 m)   Wt 222 lb 3.2 oz (100.8 kg)   BMI 34.80 kg/m    Physical Exam Vitals and nursing note reviewed.  Constitutional:      Appearance: He is well-developed.  HENT:     Head: Normocephalic and atraumatic.  Eyes:     Conjunctiva/sclera: Conjunctivae normal.     Pupils: Pupils are equal, round, and reactive to light.  Cardiovascular:     Rate and Rhythm: Normal rate and regular rhythm.     Heart sounds: Normal heart sounds.  Pulmonary:     Effort: Pulmonary effort is normal.     Breath sounds: Normal breath sounds.  Abdominal:     General: Bowel  sounds are normal.     Palpations: Abdomen is soft.  Musculoskeletal:     Cervical back: Normal range of motion and neck supple.  Skin:    General: Skin is warm and dry.     Capillary Refill: Capillary refill takes less than 2 seconds.  Neurological:     Mental Status: He is alert and oriented to person, place, and time.  Psychiatric:        Behavior: Behavior normal.      Musculoskeletal Exam: C-spine thoracic and lumbar spine were in good range of motion.  Shoulder joints, elbow joints, wrist joints with good range of motion.  He has some PIP DIP and MCP thickening but no synovitis was noted.  He had mild tenderness over right second and third MCP joints.  Hip joints were in good range of motion.  He had good range of motion of the joints without any discomfort.  He had some tenderness across MTPs but no synovitis was noted.  CDAI Exam: CDAI Score: 2.8  Patient Global: 4 mm; Provider Global: 4 mm Swollen: 0 ; Tender: 2  Joint Exam 10/14/2019      Right  Left  MCP 2   Tender     MCP 3   Tender        Investigation: No additional findings.  Imaging: XR Foot 2 Views Left  Result Date: 10/14/2019  MTP, PIP and DIP narrowing was noted.  Juxta-articular osteopenia was noted.  Erosive changes were noted in the second third fourth and fifth MTP joints.  Mild metatarsal tarsal joint space narrowing was noted.  No intertarsal joint space narrowing was noted.  No subtalar joint space narrowing was noted.  Posterior calcaneal spur was noted. 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  XR Foot 2 Views Right  Result Date: 10/14/2019 Juxta-articular osteopenia was noted.  Narrowing of first second and fifth MTP joints was noted.  Erosive changes were noted in the first second third and fifth MTP joints.  PIP and DIP narrowing was noted.  Intertarsal narrowing was noted.  No tibiotalar or subtalar joint space narrowing was noted.  Inferior  and posterior calcaneal spurs were noted. 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  XR Hand 2 View Left  Result Date: 10/14/2019 Juxta-articular osteopenia was noted.  Right first, second and third MCP joint narrowing was noted.  PIP and DIP narrowing was noted.  Mild intercarpal and radiocarpal joint space narrowing was noted.  Possible erosive changes were noted in the ulnar styloid. 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  XR Hand 2 View Right  Result Date: 10/14/2019 Juxta-articular osteopenia was noted.  Right first, second and third MCP joint narrowing was noted.  PIP and DIP narrowing was noted.  Mild intercarpal and radiocarpal joint space narrowing was noted.  Possible erosive changes were noted in the ulnar styloid. Impression: These findings are  consistent with erosive rheumatoid arthritis.   Recent Labs: Lab Results  Component Value Date   WBC 5.6 08/08/2019   HGB 15.2 08/08/2019   PLT 209 08/08/2019   NA 138 08/08/2019   K 4.2 08/08/2019   CL 97 08/08/2019   CO2 24 08/08/2019   GLUCOSE 112 (H) 08/08/2019   BUN 8 08/08/2019   CREATININE 0.79 08/08/2019   BILITOT 1.1 08/08/2019   ALKPHOS 77 08/08/2019   AST 22 08/08/2019   ALT 26 08/08/2019   PROT 7.5 08/08/2019   ALBUMIN 4.5 08/08/2019   CALCIUM 9.5 08/08/2019   GFRAA 114 08/08/2019   QFTBGOLD NEGATIVE 07/03/2017   QFTBGOLDPLUS Negative 08/08/2019    Speciality Comments: Prior therapy includes: Enbrel (pancytopenia) and methotrexate (inadequate response)  Procedures:  No procedures performed Allergies: Patient has no known allergies.   Assessment / Plan:     Visit Diagnoses: Rheumatoid arthritis involving multiple sites with positive rheumatoid factor ([place]) - He previously discontinued Enbrel due to neutropenia.-He has been on Orencia every 10 days which she has been tolerating well.  He continues to have some stiffness  and pain in his hands, which she relates to chopping wood's and being very active.  No significant synovitis was noted.  To monitor the disease process we obtain x-rays today.  No comparison films were available in the system.- Plan: XR Hand 2 View Right, XR Hand 2 View Left, XR Foot 2 Views Right, XR Foot 2 Views Left.  The x-ray showed MCP joint narrowing and some erosive changes in his hands and feet as described above.  We will monitor his x-rays over time.  High risk medications (not anticoagulants) long-term use - Orencia 125 mg every 10 days (started on 07/09/18)..  The labs done in December were within normal limits.  Neutropenia is resolved now.  His TB gold was negative.  Primary osteoarthritis of both hands-joint protection muscle strengthening was discussed.  Primary osteoarthritis of both feet-he has erosive changes in his feet as well.  He does not have any synovitis on examination today.  Orders: Orders Placed This Encounter  Procedures  . XR Hand 2 View Right  . XR Hand 2 View Left  . XR Foot 2 Views Right  . XR Foot 2 Views Left   No orders of the defined types were placed in this encounter.   Face-to-face time spent with patient was 30 minutes. Greater than 50% of time was spent in counseling and coordination of care.  Follow-Up Instructions: Return for Rheumatoid arthritis, Osteoarthritis.   Bo Merino, MD  Note - This record has been created using Editor, commissioning.  Chart creation errors have been sought, but may not always  have been located. Such creation errors do not reflect on  the standard of medical care.

## 2019-10-14 ENCOUNTER — Ambulatory Visit: Payer: Self-pay

## 2019-10-14 ENCOUNTER — Encounter: Payer: Self-pay | Admitting: Rheumatology

## 2019-10-14 ENCOUNTER — Other Ambulatory Visit: Payer: Self-pay

## 2019-10-14 ENCOUNTER — Ambulatory Visit (INDEPENDENT_AMBULATORY_CARE_PROVIDER_SITE_OTHER): Payer: BC Managed Care – PPO | Admitting: Rheumatology

## 2019-10-14 VITALS — BP 169/91 | HR 67 | Resp 15 | Ht 67.0 in | Wt 222.2 lb

## 2019-10-14 DIAGNOSIS — M19071 Primary osteoarthritis, right ankle and foot: Secondary | ICD-10-CM | POA: Diagnosis not present

## 2019-10-14 DIAGNOSIS — Z79899 Other long term (current) drug therapy: Secondary | ICD-10-CM | POA: Diagnosis not present

## 2019-10-14 DIAGNOSIS — M19042 Primary osteoarthritis, left hand: Secondary | ICD-10-CM | POA: Diagnosis not present

## 2019-10-14 DIAGNOSIS — M19041 Primary osteoarthritis, right hand: Secondary | ICD-10-CM | POA: Diagnosis not present

## 2019-10-14 DIAGNOSIS — M0579 Rheumatoid arthritis with rheumatoid factor of multiple sites without organ or systems involvement: Secondary | ICD-10-CM | POA: Diagnosis not present

## 2019-10-14 DIAGNOSIS — M19072 Primary osteoarthritis, left ankle and foot: Secondary | ICD-10-CM

## 2019-10-14 NOTE — Patient Instructions (Signed)
Standing Labs We placed an order today for your standing lab work.    Please come back and get your standing labs in march and every 3 months  We have open lab daily Monday through Thursday from 8:30-12:30 PM and 1:30-4:30 PM and Friday from 8:30-12:30 PM and 1:30-4:00 PM at the office of Dr. Bo Merino.   You may experience shorter wait times on Monday and Friday afternoons. The office is located at [address] [place], [place], [place] [address] No appointment is necessary.   Labs are drawn by [facility].  You may receive a bill from [facility] for your lab work.  If you wish to have your labs drawn at another location, please call the office 24 hours in advance to send orders.  If you have any questions regarding directions or hours of operation,  please call [phone].   Just as a reminder please drink plenty of water prior to coming for your lab work. Thanks!

## 2019-11-24 MED FILL — ORENCIA CLICKJECT 125 MG/ML: 125 | 30 days supply | Qty: 3 | Fill #2

## 2019-12-18 ENCOUNTER — Telehealth: Payer: Self-pay | Admitting: Rheumatology

## 2019-12-18 NOTE — Telephone Encounter (Signed)
Florentina Jenny called back, and scheduled patient a return appointment and to have labs drawn on 12/30/2019.

## 2019-12-18 NOTE — Telephone Encounter (Signed)
Last Visit: 10/14/2019  Next Visit: left message for patient to call and schedule.  Labs: 08/08/2019 Glucose is 112. Rest of CMP WNL. CBC stable.  TB Gold:  08/08/2019 negative   Attempted to contact patient and left message on Karla's cell phone (who is on DPR) to advise we need updated labs and to schedule an appointment.

## 2019-12-22 NOTE — Progress Notes (Signed)
Office Visit Note  Patient: Daniel Griffin             Date of Birth: [birth year]           MRN: [id number]             PCP: Default, Provider, MD Referring: No ref. provider found Visit Date: 12/30/2019 Occupation: @GUAROCC @  Subjective:  Pain in multiple joints  History of Present Illness: Daniel Griffin is a 59 y.o. male with history of seropositive rheumatoid arthritis and osteoarthritis.  He is on Orencia 125 mg sq injections every 10 days.  He states that on days 7-8 he starts having increased joint pain and joint stiffness in multiple joints.  He is currently having pain in both shoulders, both hands, and both knee joints.  He denies any joint swelling.  Patient reports that he was due for Orencia on Sunday but ran out of injections due to needing to update lab work.   Activities of Daily Living:  Patient reports morning stiffness for  15-20 minutes.   Patient Denies nocturnal pain.  Difficulty dressing/grooming: Denies Difficulty climbing stairs: Reports Difficulty getting out of chair: Reports Difficulty using hands for taps, buttons, cutlery, and/or writing: Denies  Review of Systems  Constitutional: Negative for fatigue and night sweats.  HENT: Negative for mouth sores, mouth dryness and nose dryness.   Eyes: Negative for redness and dryness.  Respiratory: Negative for cough, hemoptysis, shortness of breath and difficulty breathing.   Cardiovascular: Negative for chest pain, palpitations, hypertension, irregular heartbeat and swelling in legs/feet.  Gastrointestinal: Negative for constipation and diarrhea.  Endocrine: Negative for increased urination.  Genitourinary: Negative for painful urination.  Musculoskeletal: Positive for arthralgias, joint pain and morning stiffness. Negative for joint swelling, myalgias, muscle weakness, muscle tenderness and myalgias.  Skin: Negative for color change, rash, hair loss, nodules/bumps, skin tightness, ulcers and sensitivity  to sunlight.  Allergic/Immunologic: Negative for susceptible to infections.  Neurological: Negative for dizziness, fainting, memory loss, night sweats and weakness.  Hematological: Negative for swollen glands.  Psychiatric/Behavioral: Negative for depressed mood and sleep disturbance. The patient is not nervous/anxious.     PMFS History:  Patient Active Problem List   Diagnosis Date Noted  . Pain in left knee 04/16/2019  . Transaminitis 07/04/2018  . Leukopenia 05/22/2018  . Rheumatoid arthritis involving multiple sites with positive rheumatoid factor ([place]) 11/30/2016  . High risk medications (not anticoagulants) long-term use 11/30/2016  . Pain in both hands 11/30/2016  . Pain in joints of both feet 11/30/2016    Past Medical History:  Diagnosis Date  . Arthritis     Family History  Problem Relation Age of Onset  . Alzheimer's disease Father   . Healthy Son   . Healthy Daughter   . Rheum arthritis Daughter   . Healthy Daughter    Past Surgical History:  Procedure Laterality Date  . APPENDECTOMY     Social History   Social History Narrative  . Not on file   Immunization History  Administered Date(s) Administered  . Influenza,inj,Quad PF,6+ Mos 07/04/2018     Objective: Vital Signs: BP (!) 153/95 (BP Location: Left Arm, Patient Position: Sitting, Cuff Size: Normal)   Pulse 78   Resp 16   Ht 5\' 7"  (1.702 m)   Wt 222 lb (100.7 kg)   BMI 34.77 kg/m    Physical Exam Vitals and nursing note reviewed.  Constitutional:      Appearance: He is well-developed.  HENT:  Head: Normocephalic and atraumatic.  Eyes:     Conjunctiva/sclera: Conjunctivae normal.     Pupils: Pupils are equal, round, and reactive to light.  Pulmonary:     Effort: Pulmonary effort is normal.  Abdominal:     General: Bowel sounds are normal.     Palpations: Abdomen is soft.  Musculoskeletal:     Cervical back: Normal range of motion and neck supple.  Skin:    General: Skin is warm  and dry.     Capillary Refill: Capillary refill takes less than 2 seconds.  Neurological:     Mental Status: He is alert and oriented to person, place, and time.  Psychiatric:        Behavior: Behavior normal.      Musculoskeletal Exam: C-spine, thoracic spine, and lumbar spine good ROM.  Shoulder joints good ROM with discomfort bilaterally. Bilateral elbow joint contracture.  Wrist joints good ROM with no discomfort.  No tenderness or synovitis of wrist joints or MCP joints. PIP and DIP prominence.  Bilateral 2nd MCP joint synovial thickening.  Complete fist formation bilaterally.  Hip joints good ROM with no discomfort.  Knee joints good ROM with no warmth or effusion.  Tenderness of both ankle joints.    CDAI Exam: CDAI Score: 1.1  Patient Global: 7 mm; Provider Global: 4 mm Swollen: 0 ; Tender: 0  Joint Exam 12/30/2019   No joint exam has been documented for this visit   There is currently no information documented on the homunculus. Go to the Rheumatology activity and complete the homunculus joint exam.  Investigation: No additional findings.  Imaging: No results found.  Recent Labs: Lab Results  Component Value Date   WBC 5.6 08/08/2019   HGB 15.2 08/08/2019   PLT 209 08/08/2019   NA 138 08/08/2019   K 4.2 08/08/2019   CL 97 08/08/2019   CO2 24 08/08/2019   GLUCOSE 112 (H) 08/08/2019   BUN 8 08/08/2019   CREATININE 0.79 08/08/2019   BILITOT 1.1 08/08/2019   ALKPHOS 77 08/08/2019   AST 22 08/08/2019   ALT 26 08/08/2019   PROT 7.5 08/08/2019   ALBUMIN 4.5 08/08/2019   CALCIUM 9.5 08/08/2019   GFRAA 114 08/08/2019   QFTBGOLD NEGATIVE 07/03/2017   QFTBGOLDPLUS Negative 08/08/2019    Speciality Comments: Prior therapy includes: Enbrel (pancytopenia) and methotrexate (inadequate response)  Procedures:  No procedures performed Allergies: Patient has no known allergies.   Assessment / Plan:     Visit Diagnoses: Rheumatoid arthritis involving multiple sites  with positive rheumatoid factor ([place]): He has no synovitis on examination today.  He has not had any recent rheumatoid arthritis flares.  He continues to have chronic pain in both shoulders, both hands, both knee joints, both ankles.  No inflammation was noted on exam.  He does have tenderness of both ankle joints.  He is on Orencia 125 mg subcu injections every 10 days.  He has had to space the dose of Orencia due to history of neutropenia.  According to the patient he experiences increased pain and stiffness in his joints around the 7 to 8th day white blood cell count was 5.6 on 08/08/2019.  He is due to update lab work today.  We will check a sedimentation rate today.  He will continue injecting Orencia 125 mg subcutaneously every 10 days.  He was advised to notify us if he develops increased joint pain or joint swelling.  He will follow-up in the office in 5 months.- Plan:  Sedimentation rate  High risk medications (not anticoagulants) long-term use - Orencia 125 mg every 10 days (started on 07/09/18), d/c enbrel-neutropenia.  CBC and CMP were drawn on 08/08/2019.  He is overdue to update lab work today.  Orders for CBC and CMP were released.  TB gold was negative on 08/08/2019.- Plan: CBC with Differential/Platelet, COMPLETE METABOLIC PANEL WITH GFR  Primary osteoarthritis of both hands: He has PIP and DIP prominence bilaterally.  No tenderness or synovitis was noted.  He has complete fist formation bilaterally.  Joint protection and muscle strengthening were discussed.  Primary osteoarthritis of both feet: He has chronic pain in both ankle joints but has good range of motion bilaterally.  Has tenderness of both ankles but no synovitis was noted.  Other decreased white blood cell count: White blood cell count was 5.6 on 08/08/2019.  He is due to update lab work today.  Orders: Orders Placed This Encounter  Procedures  . CBC with Differential/Platelet  . COMPLETE METABOLIC PANEL WITH GFR  .  Sedimentation rate   No orders of the defined types were placed in this encounter.    Follow-Up Instructions: Return in 5 months (on 05/31/2020) for Rheumatoid arthritis, Osteoarthritis.   Hazel Sams, PA-C  I examined and evaluated the patient with Hazel Sams PA.  Patient continues to have discomfort in his joints.  He had no synovitis on my examination today.  We will check sed rate today.  We will continue current treatment.  The plan of care was discussed as noted above.  Bo Merino, MD    Note - This record has been created using Editor, commissioning.  Chart creation errors have been sought, but may not always  have been located. Such creation errors do not reflect on  the standard of medical care.

## 2019-12-30 ENCOUNTER — Other Ambulatory Visit: Payer: Self-pay

## 2019-12-30 ENCOUNTER — Encounter: Payer: Self-pay | Admitting: Physician Assistant

## 2019-12-30 ENCOUNTER — Ambulatory Visit (INDEPENDENT_AMBULATORY_CARE_PROVIDER_SITE_OTHER): Payer: BC Managed Care – PPO | Admitting: Physician Assistant

## 2019-12-30 VITALS — BP 153/95 | HR 78 | Resp 16 | Ht 67.0 in | Wt 222.0 lb

## 2019-12-30 DIAGNOSIS — M19041 Primary osteoarthritis, right hand: Secondary | ICD-10-CM | POA: Diagnosis not present

## 2019-12-30 DIAGNOSIS — M19072 Primary osteoarthritis, left ankle and foot: Secondary | ICD-10-CM

## 2019-12-30 DIAGNOSIS — M19071 Primary osteoarthritis, right ankle and foot: Secondary | ICD-10-CM

## 2019-12-30 DIAGNOSIS — Z79899 Other long term (current) drug therapy: Secondary | ICD-10-CM | POA: Diagnosis not present

## 2019-12-30 DIAGNOSIS — M0579 Rheumatoid arthritis with rheumatoid factor of multiple sites without organ or systems involvement: Secondary | ICD-10-CM | POA: Diagnosis not present

## 2019-12-30 DIAGNOSIS — M19042 Primary osteoarthritis, left hand: Secondary | ICD-10-CM

## 2019-12-30 DIAGNOSIS — D72818 Other decreased white blood cell count: Secondary | ICD-10-CM

## 2019-12-30 LAB — COMPLETE METABOLIC PANEL WITH GFR
AG Ratio: 1.4 (calc) (ref 1.0–2.5)
ALT: 19 U/L (ref 9–46)
AST: 17 U/L (ref 10–35)
Albumin: 4.2 g/dL (ref 3.6–5.1)
Alkaline phosphatase (APISO): 62 U/L (ref 35–144)
BUN: 13 mg/dL (ref 7–25)
CO2: 25 mmol/L (ref 20–32)
Calcium: 9 mg/dL (ref 8.6–10.3)
Chloride: 103 mmol/L (ref 98–110)
Creat: 0.92 mg/dL (ref 0.70–1.33)
GFR, Est African American: 105 mL/min/{1.73_m2} (ref >=60)
GFR, Est Non African American: 91 mL/min/{1.73_m2} (ref >=60)
Globulin: 3.1 g/dL (calc) (ref 1.9–3.7)
Glucose, Bld: 128 mg/dL — ABNORMAL HIGH (ref 65–99)
Potassium: 4.2 mmol/L (ref 3.5–5.3)
Sodium: 139 mmol/L (ref 135–146)
Total Bilirubin: 0.8 mg/dL (ref 0.2–1.2)
Total Protein: 7.3 g/dL (ref 6.1–8.1)

## 2019-12-30 LAB — CBC WITH DIFFERENTIAL/PLATELET
Absolute Monocytes: 614 cells/uL (ref 200–950)
Basophils Absolute: 37 cells/uL (ref 0–200)
Basophils Relative: 0.6 %
Eosinophils Absolute: 37 cells/uL (ref 15–500)
Eosinophils Relative: 0.6 %
HCT: 44.3 % (ref 38.5–50.0)
Hemoglobin: 15 g/dL (ref 13.2–17.1)
Lymphs Abs: 763 cells/uL — ABNORMAL LOW (ref 850–3900)
MCH: 33.3 pg — ABNORMAL HIGH (ref 27.0–33.0)
MCHC: 33.9 g/dL (ref 32.0–36.0)
MCV: 98.4 fL (ref 80.0–100.0)
MPV: 9.8 fL (ref 7.5–12.5)
Monocytes Relative: 9.9 %
Neutro Abs: 4749 cells/uL (ref 1500–7800)
Neutrophils Relative %: 76.6 %
Platelets: 224 10*3/uL (ref 140–400)
RBC: 4.5 10*6/uL (ref 4.20–5.80)
RDW: 12.5 % (ref 11.0–15.0)
Total Lymphocyte: 12.3 %
WBC: 6.2 10*3/uL (ref 3.8–10.8)

## 2019-12-30 LAB — SEDIMENTATION RATE: Sed Rate: 31 mm/h — ABNORMAL HIGH (ref 0–20)

## 2019-12-30 NOTE — Patient Instructions (Signed)
Standing Labs We placed an order today for your standing lab work.    Please come back and get your standing labs in August and every 3 months   We have open lab daily Monday through Thursday from 8:30-12:30 PM and 1:30-4:30 PM and Friday from 8:30-12:30 PM and 1:30-4:00 PM at the office of Dr. Shaili Deveshwar.   You may experience shorter wait times on Monday and Friday afternoons. The office is located at [address], [address], [place], [place] [address] No appointment is necessary.   Labs are drawn by [facility].  You may receive a bill from [facility] for your lab work.  If you wish to have your labs drawn at another location, please call the office 24 hours in advance to send orders.  If you have any questions regarding directions or hours of operation,  please call [phone].   Just as a reminder please drink plenty of water prior to coming for your lab work. Thanks!   

## 2019-12-30 NOTE — Progress Notes (Signed)
Medication Samples have been provided to the patient.  Drug name: orencia   Strength: 125mg      Qty: 1 [id number] Exp.Date: 09/2020  Dosing instructions: Inject 125mg  into the skin every 10 days.   The patient has been instructed regarding the correct time, dose, and frequency of taking this medication, including desired effects and most common side effects.   Daniel Griffin 8:29 AM 12/30/2019

## 2019-12-31 NOTE — Progress Notes (Signed)
ESR is elevated-31.  WBC count is WNL.  Absolute lymphocytes are low but stable.  Glucose is elevated-128. Rest of CMP WNL.   I spoke with Dr. Estanislado Pandy and she is ok with the patient increasing the frequency of Orencia 125 mg sq injections once weekly.  Please advise the patient to update lab work in 1 month.

## 2020-01-02 ENCOUNTER — Telehealth: Payer: Self-pay | Admitting: Rheumatology

## 2020-01-02 MED ORDER — ORENCIA CLICKJECT 125 MG/ML ~~LOC~~ SOAJ: 125.0000 mg | SUBCUTANEOUS | 0 refills | Status: DC

## 2020-01-02 NOTE — Telephone Encounter (Signed)
Patient's daughter left a message stating she is returning a call  in reference to lab results. Please call to advise.

## 2020-01-02 NOTE — Telephone Encounter (Signed)
ESR is elevated-31. WBC count is WNL. Absolute lymphocytes are low but stable. Glucose is elevated-128. Rest of CMP WNL.   I spoke with Dr. Estanislado Pandy and she is ok with the patient increasing the frequency of Orencia 125 mg sq injections once weekly. Please advise the patient to update lab work in 1 month  Last Visit: 12/30/2019 Next Visit: 05/31/2020 Labs: 12/30/2019 TB Gold: 08/08/2019    Okay to refill per Dr. Estanislado Pandy

## 2020-01-02 NOTE — Telephone Encounter (Signed)
-----  Message from Ofilia Neas, PA-C sent at 12/31/2019 12:21 PM EDT ----- ESR is elevated-31.  WBC count is WNL.  Absolute lymphocytes are low but stable.  Glucose is elevated-128. Rest of CMP WNL.   I spoke with Dr. Estanislado Pandy and she is ok with the patient increasing the frequency of Orencia 125 mg sq injections once weekly.  Please advise the patient to update lab work in 1 month.

## 2020-01-05 MED FILL — ORENCIA CLICKJECT 125 MG/ML: 125 | 28 days supply | Qty: 4 | Fill #0

## 2020-01-29 ENCOUNTER — Other Ambulatory Visit: Payer: Self-pay | Admitting: *Deleted

## 2020-01-29 DIAGNOSIS — Z79899 Other long term (current) drug therapy: Secondary | ICD-10-CM | POA: Diagnosis not present

## 2020-01-29 LAB — COMPLETE METABOLIC PANEL WITH GFR
AG Ratio: 1.3 (calc) (ref 1.0–2.5)
ALT: 26 U/L (ref 9–46)
AST: 26 U/L (ref 10–35)
Albumin: 3.9 g/dL (ref 3.6–5.1)
Alkaline phosphatase (APISO): 62 U/L (ref 35–144)
BUN: 8 mg/dL (ref 7–25)
CO2: 22 mmol/L (ref 20–32)
Calcium: 8.8 mg/dL (ref 8.6–10.3)
Chloride: 102 mmol/L (ref 98–110)
Creat: 1.03 mg/dL (ref 0.70–1.33)
GFR, Est African American: 92 mL/min/{1.73_m2} (ref >=60)
GFR, Est Non African American: 79 mL/min/{1.73_m2} (ref >=60)
Globulin: 3 g/dL (calc) (ref 1.9–3.7)
Glucose, Bld: 100 mg/dL — ABNORMAL HIGH (ref 65–99)
Potassium: 4 mmol/L (ref 3.5–5.3)
Sodium: 138 mmol/L (ref 135–146)
Total Bilirubin: 0.8 mg/dL (ref 0.2–1.2)
Total Protein: 6.9 g/dL (ref 6.1–8.1)

## 2020-01-29 LAB — CBC WITH DIFFERENTIAL/PLATELET
Absolute Monocytes: 566 cells/uL (ref 200–950)
Basophils Absolute: 29 cells/uL (ref 0–200)
Basophils Relative: 0.7 %
Eosinophils Absolute: 119 cells/uL (ref 15–500)
Eosinophils Relative: 2.9 %
HCT: 41.9 % (ref 38.5–50.0)
Hemoglobin: 14.5 g/dL (ref 13.2–17.1)
Lymphs Abs: 1041 cells/uL (ref 850–3900)
MCH: 33.3 pg — ABNORMAL HIGH (ref 27.0–33.0)
MCHC: 34.6 g/dL (ref 32.0–36.0)
MCV: 96.3 fL (ref 80.0–100.0)
MPV: 9.9 fL (ref 7.5–12.5)
Monocytes Relative: 13.8 %
Neutro Abs: 2345 cells/uL (ref 1500–7800)
Neutrophils Relative %: 57.2 %
Platelets: 188 10*3/uL (ref 140–400)
RBC: 4.35 10*6/uL (ref 4.20–5.80)
RDW: 12.1 % (ref 11.0–15.0)
Total Lymphocyte: 25.4 %
WBC: 4.1 10*3/uL (ref 3.8–10.8)

## 2020-02-26 MED FILL — ORENCIA CLICKJECT 125 MG/ML: 125 | 28 days supply | Qty: 4 | Fill #2

## 2020-03-24 ENCOUNTER — Other Ambulatory Visit: Payer: Self-pay | Admitting: Rheumatology

## 2020-03-24 NOTE — Telephone Encounter (Signed)
Last Visit: 12/30/2019 Next Visit: 06/18/2020 Labs: 01/29/2020 CBC and CMP WNL.  TB Gold: 08/08/2019 Neg   Current Dose per lab note 12/30/2019: Dr. Deveshwar is okay with the patient increasing the frequency of Orencia 125 mg sq injections once weekly.   DX:Rheumatoid arthritis involving multiple sites with positive rheumatoid factor   Okay to refill per Dr. Deveshwar  

## 2020-03-25 MED FILL — ORENCIA CLICKJECT 125 MG/ML: 125 | 28 days supply | Qty: 4 | Fill #0

## 2020-04-21 ENCOUNTER — Other Ambulatory Visit: Payer: Self-pay | Admitting: Rheumatology

## 2020-04-21 NOTE — Telephone Encounter (Signed)
Last Visit: 12/30/2019 Next Visit: 06/18/2020 Labs: 01/29/2020 CBC and CMP WNL.  TB Gold: 08/08/2019 Neg   Current Dose per lab note 12/30/2019: Dr. Estanislado Pandy is okay with the patient increasing the frequency of Orencia 125 mg sq injections once weekly.   MW:[id number] arthritis involving multiple sites with positive rheumatoid factor   Okay to refill per Dr. Estanislado Pandy

## 2020-04-28 MED FILL — ORENCIA CLICKJECT 125 MG/ML: 125 | 28 days supply | Qty: 4 | Fill #0

## 2020-05-02 IMAGING — CT CT ABD-PELV W/ CM
3 of 5 series · 15 of 36 positions shown, 18 images · IV contrast (APPLIED)
Comparison: None.

CLINICAL DATA: Neutropenia, rheumatoid arthritis.

EXAM:
CT CHEST, ABDOMEN, AND PELVIS WITH CONTRAST
TECHNIQUE: Multidetector CT imaging of the chest, abdomen and pelvis was
performed following the standard protocol during bolus
administration of intravenous contrast.
CONTRAST:  100mL UJHSJL-0SS IOPAMIDOL (UJHSJL-0SS) INJECTION 61%

[Series 2: cap with 2 · axial · 0.87mm/px · z∈[-626,-91]mm · 10 of 131 slices shown, 13 images]
[im 12/131  mediastinal]
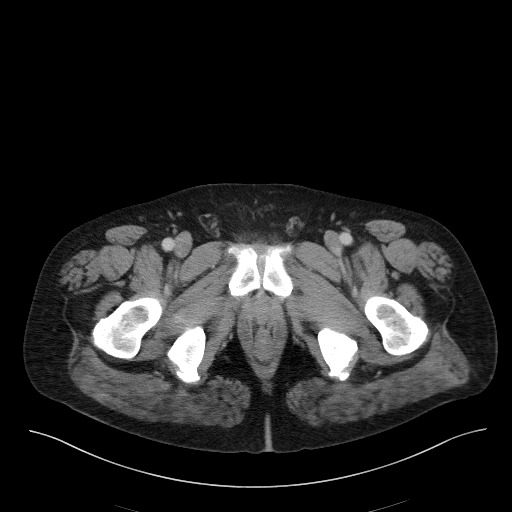
[im 12/131  lung]
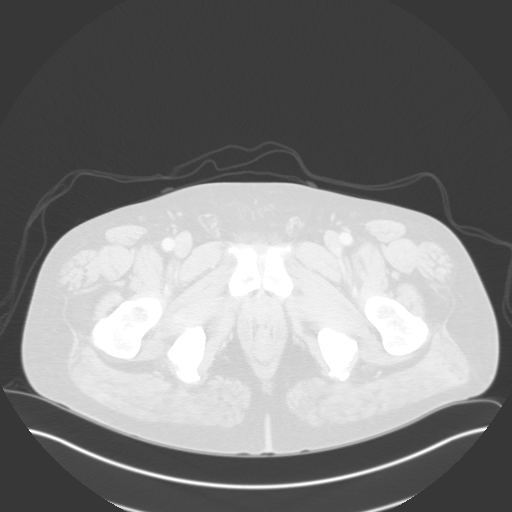
[im 24/131  lung]
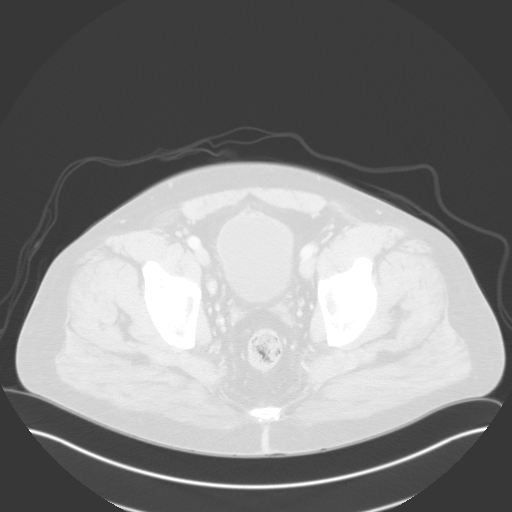
[im 36/131  lung]
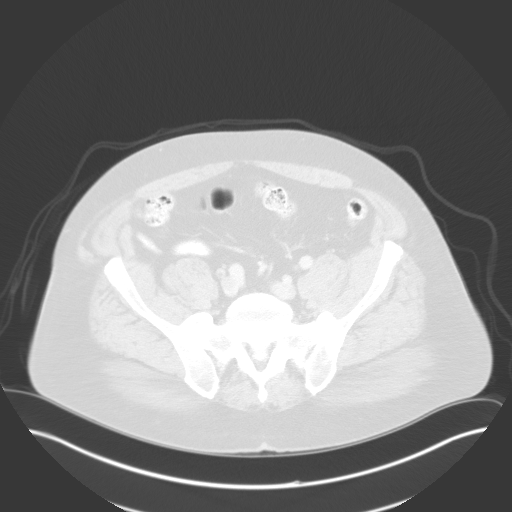
[im 48/131  lung]
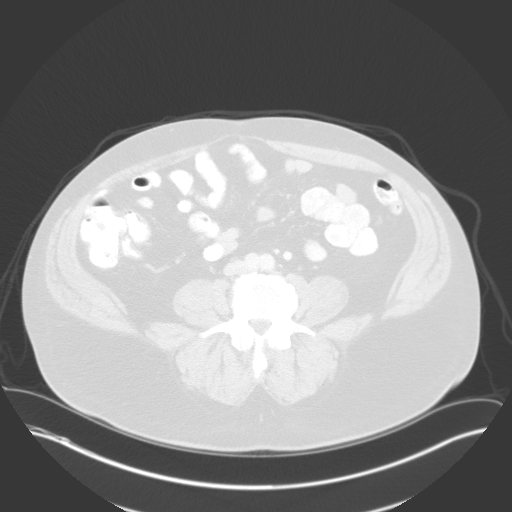
[im 60/131  mediastinal]
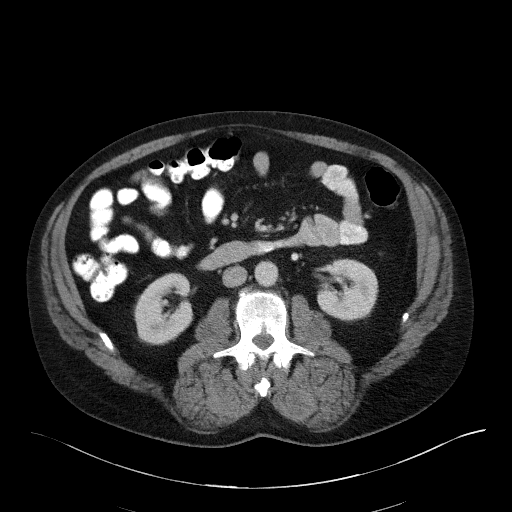
[im 60/131  lung]
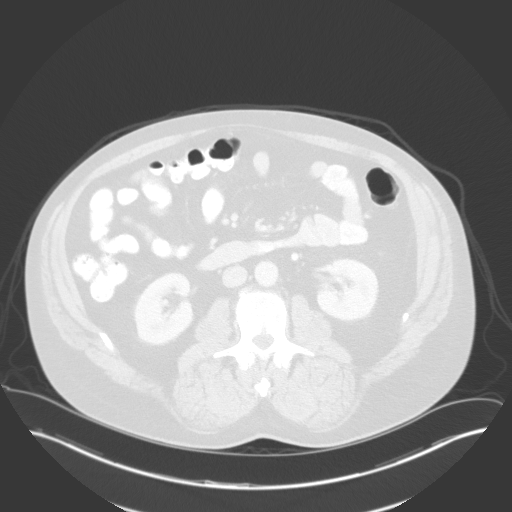
[im 71/131  lung]
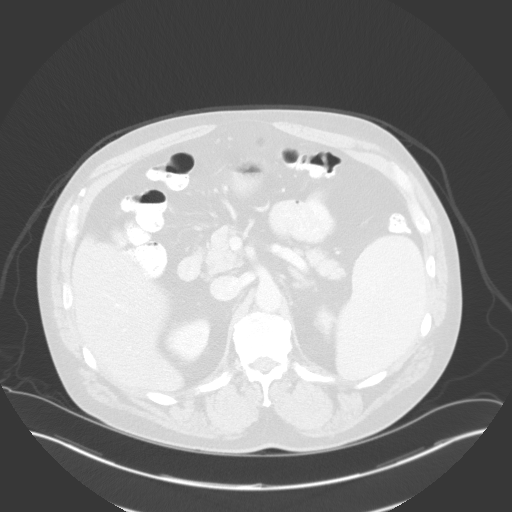
[im 83/131  lung]
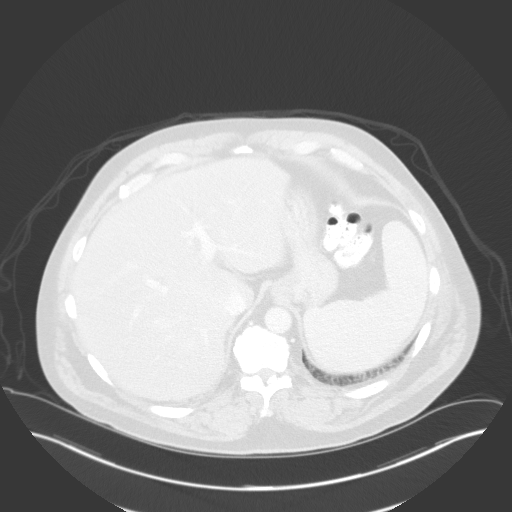
[im 95/131  lung]
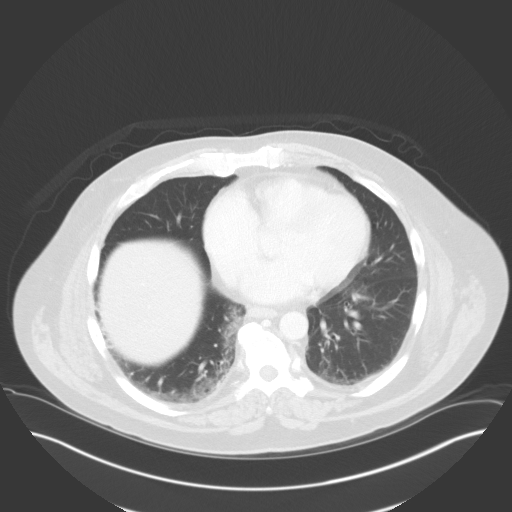
[im 107/131  mediastinal]
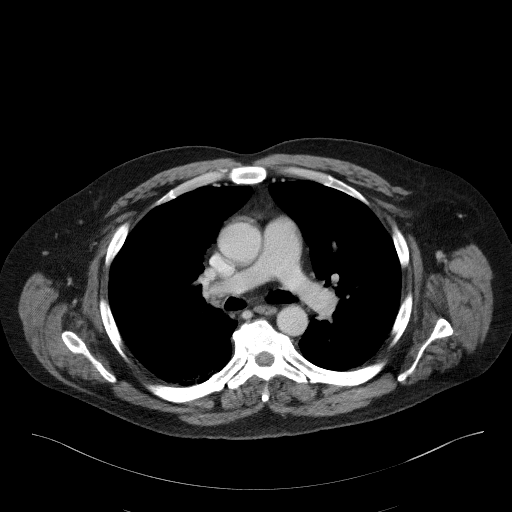
[im 107/131  lung]
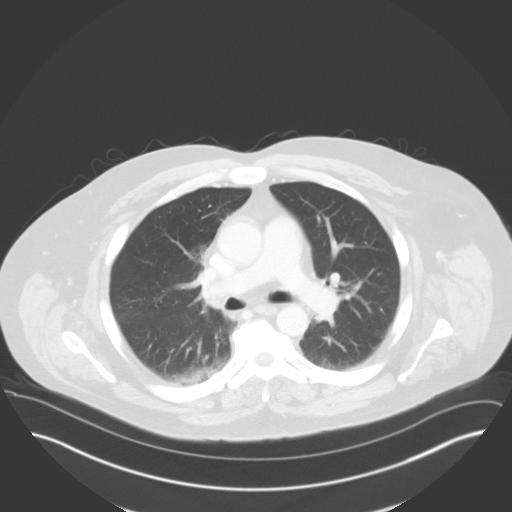
[im 119/131  lung]
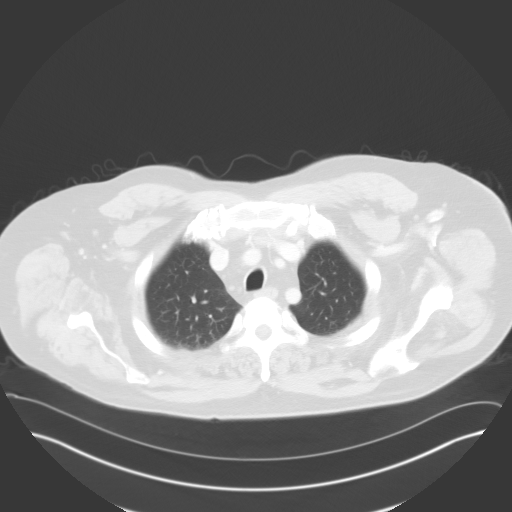

[Series 4: lung · axial · 0.78mm/px · z∈[-283,-257]mm · 2 of 139 slices shown]
[im 13/139  lung]
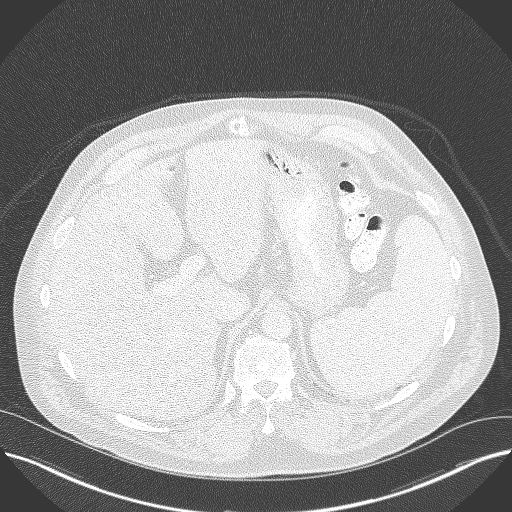
[im 26/139  lung]
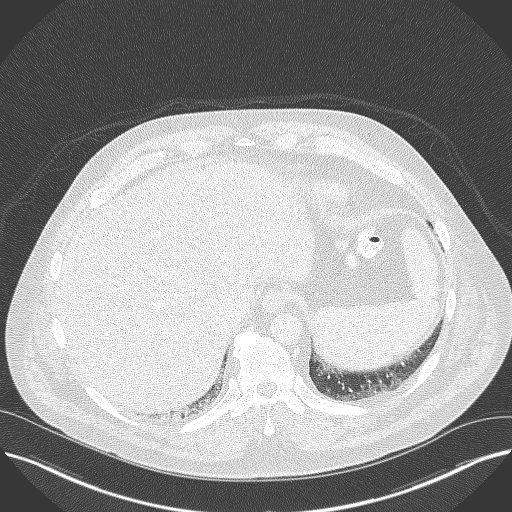

[Series 5: coronals · coronal · 0.98mm/px · 3 of 152 slices shown]
[im 31/152  lung]
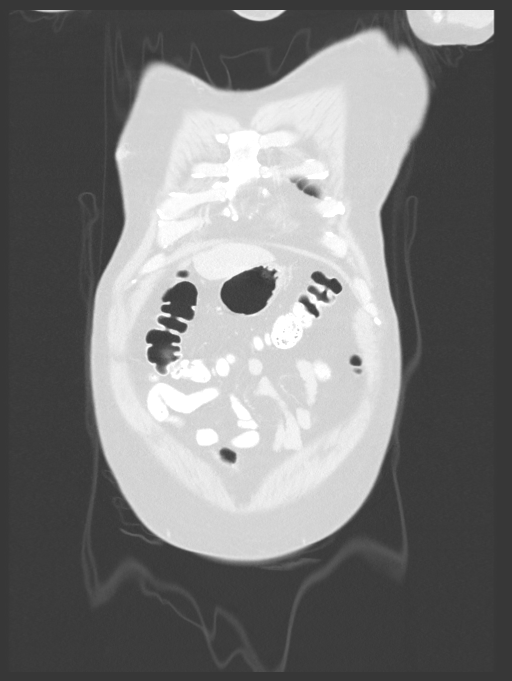
[im 61/152  lung]
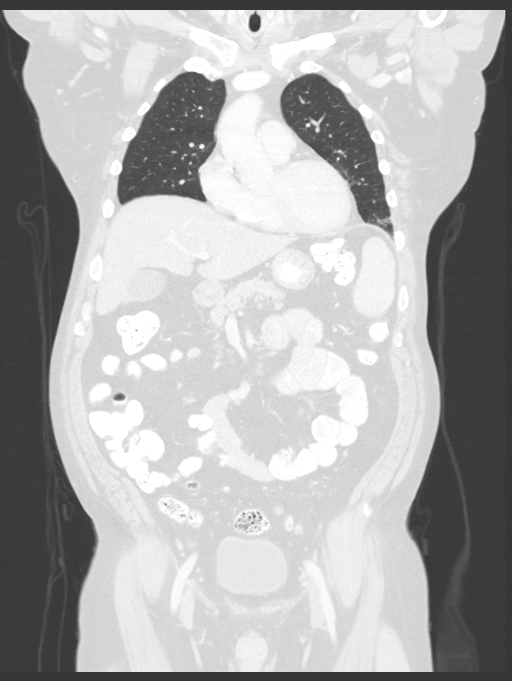
[im 91/152  lung]
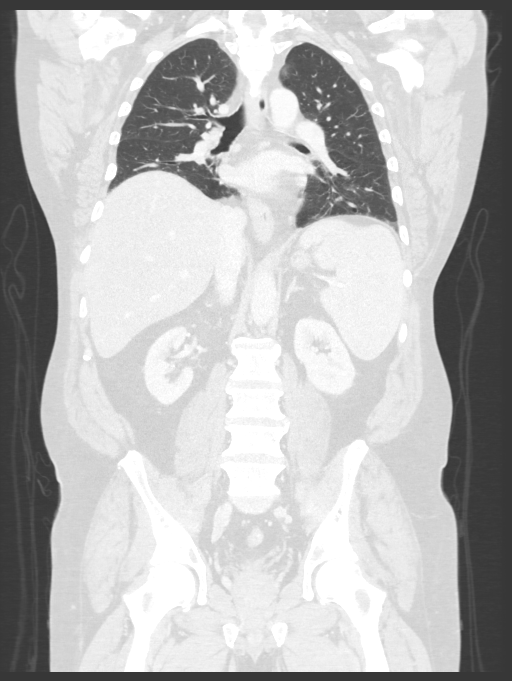

[15 of 36 positions shown; findings below may reference images not displayed]

FINDINGS: CT CHEST FINDINGS

Cardiovascular: Vascular structures are unremarkable. Heart is at
the upper limits of normal in size. No pericardial effusion.

Mediastinum/Nodes: No pathologically enlarged mediastinal, hilar or
axillary lymph nodes. Esophagus is grossly unremarkable.

Lungs/Pleura: Mild dependent atelectasis bilaterally.
Peribronchovascular nodularity in the lingula may be infectious or
post infectious in etiology. No pleural fluid. Airway is
unremarkable.

Musculoskeletal: No worrisome lytic or sclerotic lesions.
Degenerative changes in the spine.

CT ABDOMEN PELVIS FINDINGS

Hepatobiliary: Liver is decreased in attenuation diffusely. Liver
and gallbladder are otherwise unremarkable. No biliary ductal
dilatation.

Pancreas: Negative.

Spleen: Measures 14.3 cm.

Adrenals/Urinary Tract: 1.6 cm fat density lesion in the right
adrenal gland. Left adrenal gland is unremarkable. Subcentimeter
low-attenuation lesions in the kidneys are too small to characterize
but statistically, cysts are most likely. Ureters are decompressed.
Bladder is grossly unremarkable.

Stomach/Bowel: Stomach, small bowel and colon are unremarkable.
Appendix is not readily visualized.

Vascular/Lymphatic: Trace atherosclerotic calcification of the
aorta. No pathologically enlarged lymph nodes. No free fluid.

Reproductive: Prostate is visualized.

Other: No free fluid.  Mesenteries and peritoneum are unremarkable.

Musculoskeletal: Degenerative changes in the spine. No worrisome
lytic or sclerotic lesions.
IMPRESSION: 1. Hepatic steatosis.
2. Mild splenomegaly.
3. Right adrenal myelolipoma.

## 2020-05-08 IMAGING — CT CT BIOPSY
1 of 2 series · 15 of 20 positions shown, 19 images · non-contrast
Comparison: none

INDICATION: 56-year-old male with rheumatoid arthritis on chronic
immunosuppression with Enbrel. New onset pancytopenia.

[Series 2: i-spiral 5.0 b40f · axial · 0.98mm/px · z∈[-70,-20]mm · 15 of 17 slices shown, 19 images]
[im 2/17  mediastinal]
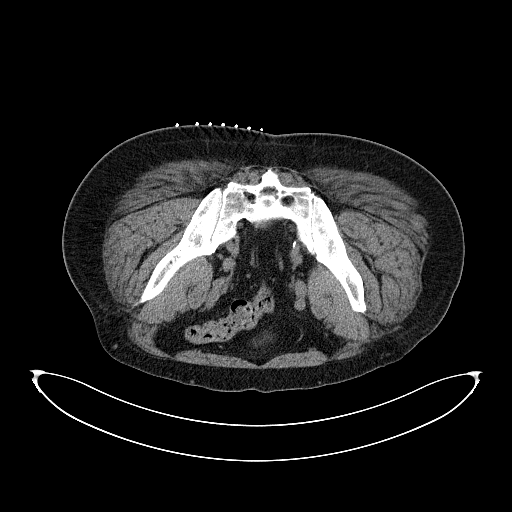
[im 2/17  lung]
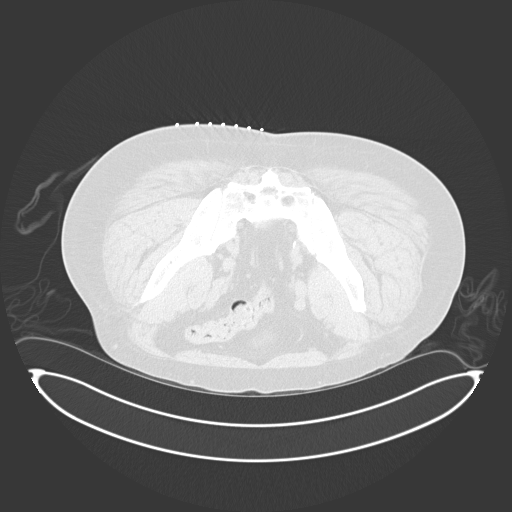
[im 3/17  lung]
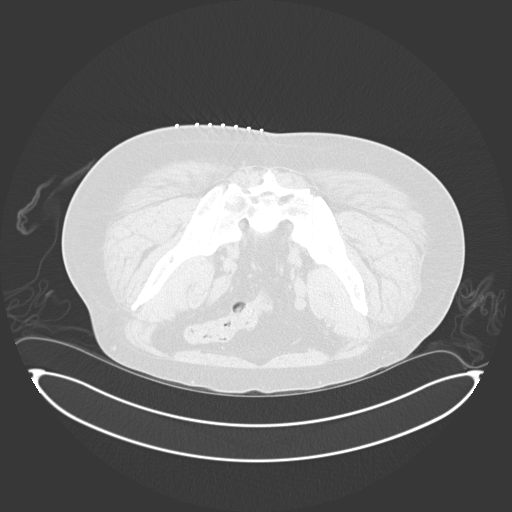
[im 4/17  lung]
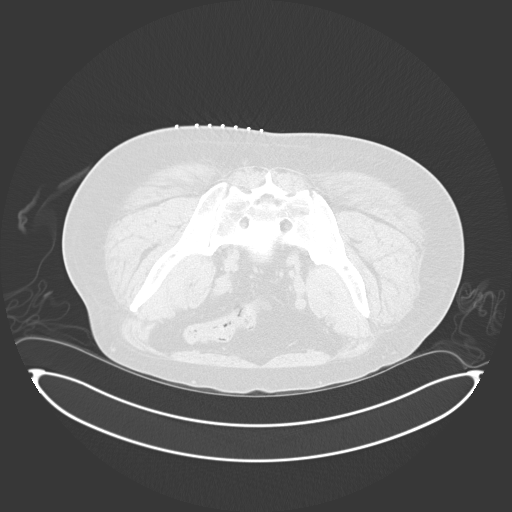
[im 5/17  lung]
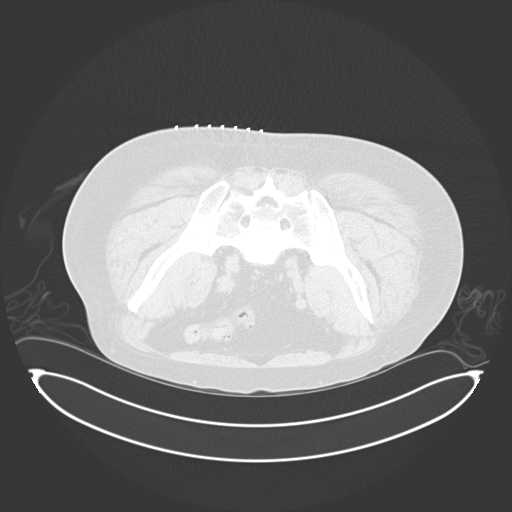
[im 6/17  mediastinal]
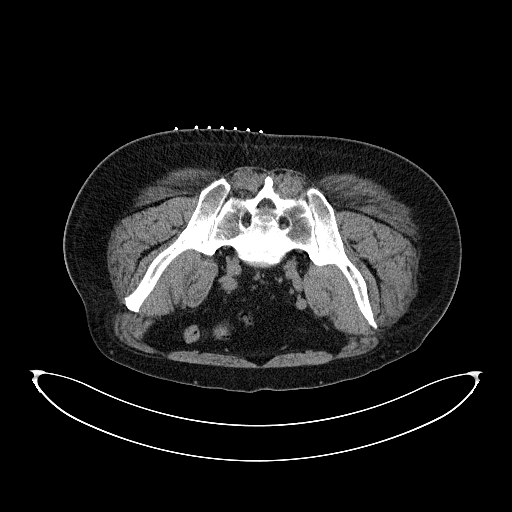
[im 6/17  lung]
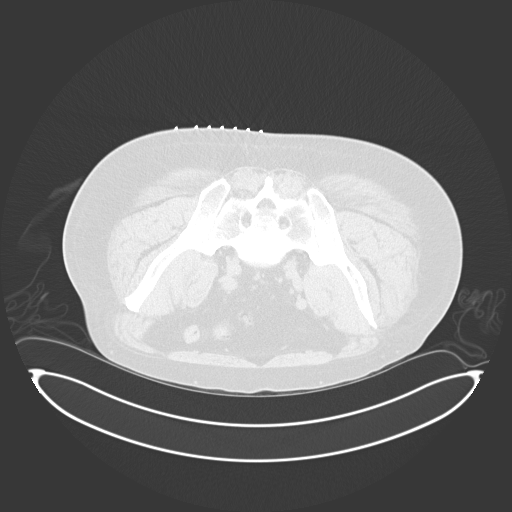
[im 7/17  lung]
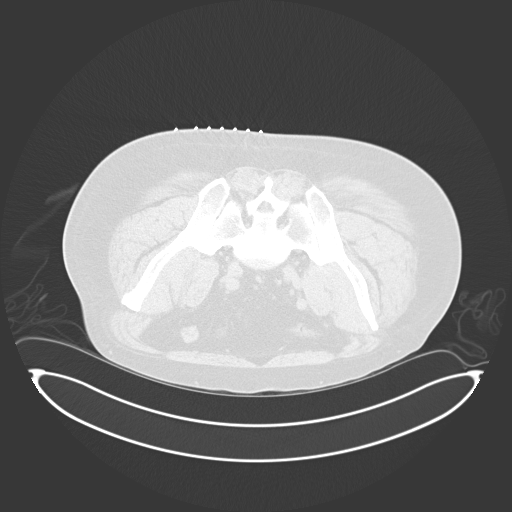
[im 8/17  lung]
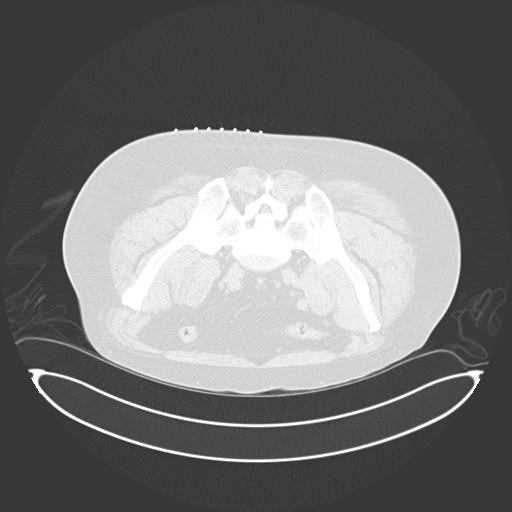
[im 9/17  lung]
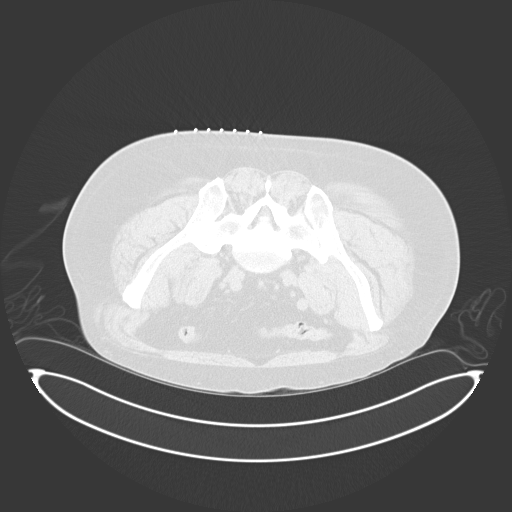
[im 10/17  mediastinal]
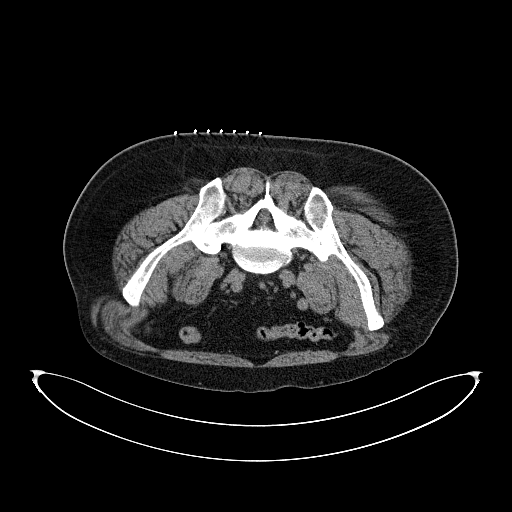
[im 10/17  lung]
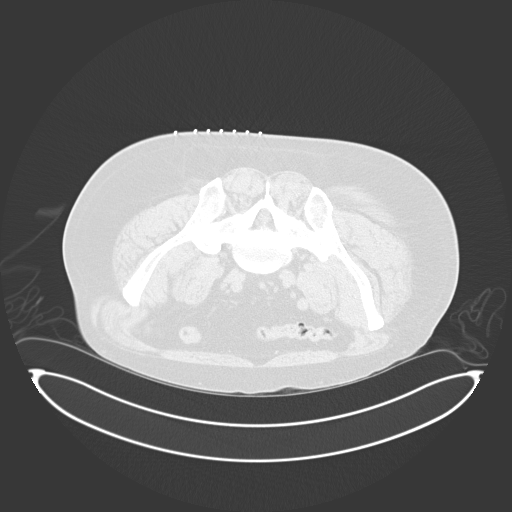
[im 11/17  lung]
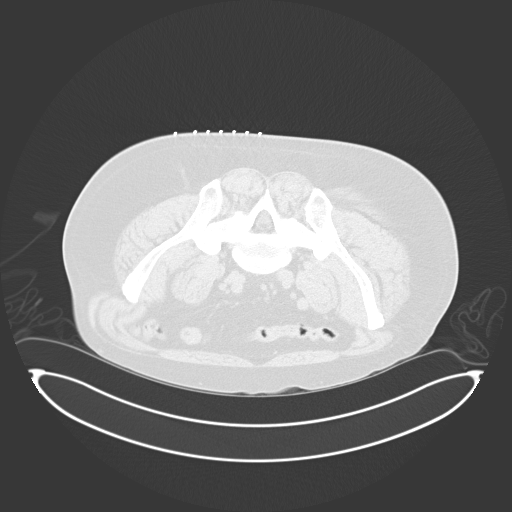
[im 12/17  lung]
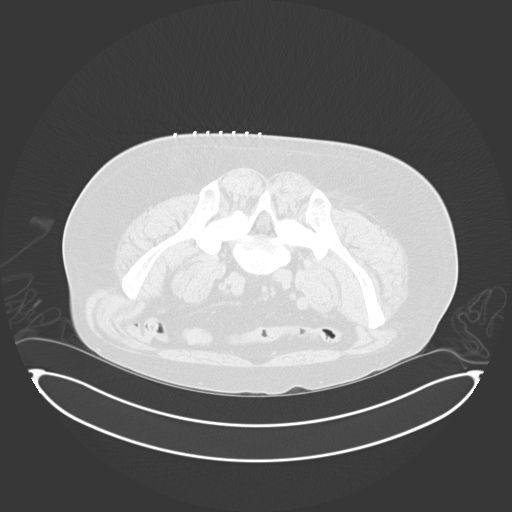
[im 13/17  lung]
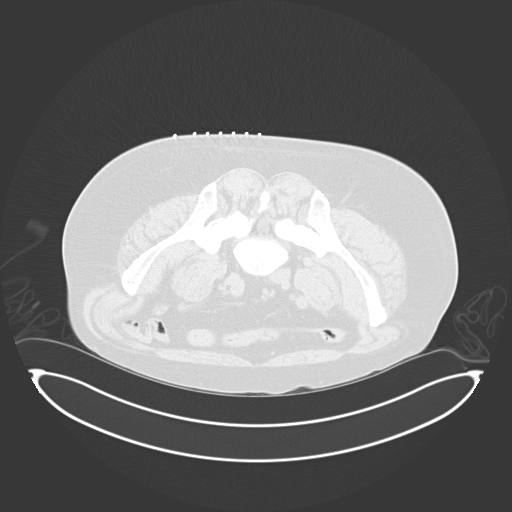
[im 14/17  mediastinal]
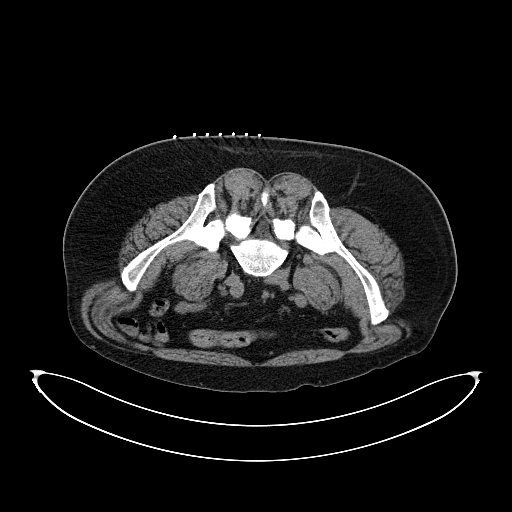
[im 14/17  lung]
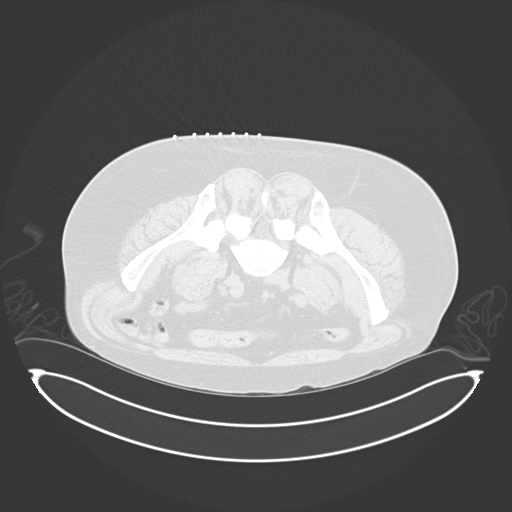
[im 15/17  lung]
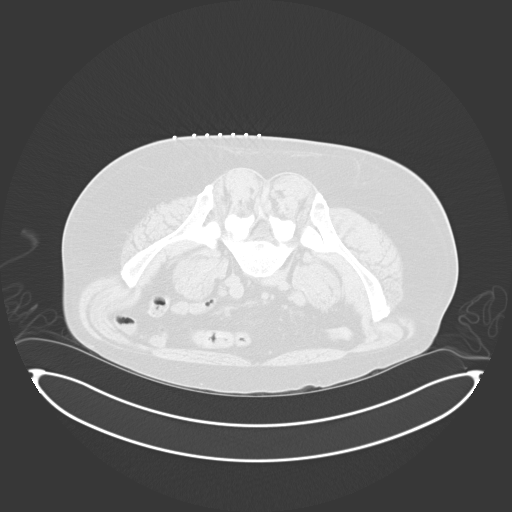
[im 16/17  lung]
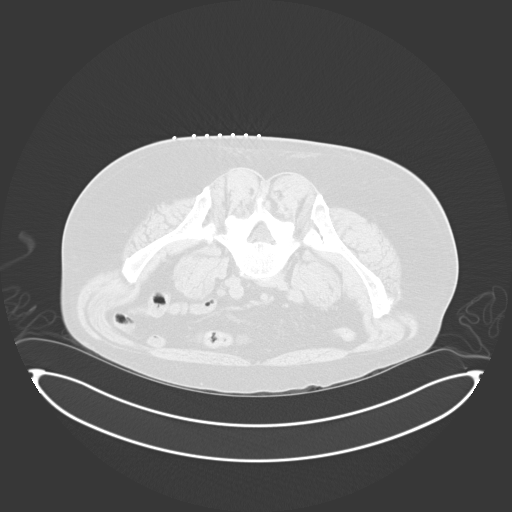

[15 of 20 positions shown; findings below may reference images not displayed]

EXAM:
CT GUIDED BONE MARROW ASPIRATION AND CORE BIOPSY

MEDICATIONS:
None.

ANESTHESIA/SEDATION:
Moderate (conscious) sedation was employed during this procedure. A
total of 2 milligrams versed and 100 micrograms fentanyl were
administered intravenously. The patient's level of consciousness and
vital signs were monitored continuously by radiology nursing
throughout the procedure under my direct supervision.

Total monitored sedation time: 10 minutes

FLUOROSCOPY TIME:  Fluoroscopy Time: 0 minutes 0 seconds (0 mGy).

COMPLICATIONS:
None immediate.

Estimated blood loss: <25 mL

PROCEDURE:
Informed written consent was obtained from the patient after a
thorough discussion of the procedural risks, benefits and
alternatives. All questions were addressed. Maximal Sterile Barrier
Technique was utilized including caps, mask, sterile gowns, sterile
gloves, sterile drape, hand hygiene and skin antiseptic. A timeout
was performed prior to the initiation of the procedure.

The patient was positioned prone and non-contrast localization CT
was performed of the pelvis to demonstrate the iliac marrow spaces.

Maximal barrier sterile technique utilized including caps, mask,
sterile gowns, sterile gloves, large sterile drape, hand hygiene,
and betadine prep.


## 2020-05-24 ENCOUNTER — Other Ambulatory Visit: Payer: Self-pay | Admitting: Rheumatology

## 2020-05-24 MED FILL — ORENCIA CLICKJECT 125 MG/ML: 125 | 28 days supply | Qty: 4 | Fill #0

## 2020-05-24 NOTE — Telephone Encounter (Signed)
Last Visit: 12/30/2019 Next Visit: 06/18/2020 Labs: 01/29/2020 CBC and CMP WNL.  TB Gold: 08/08/2019 Neg   Current Dose per lab note 12/30/2019: Dr. Estanislado Pandy is okay with the patient increasing the frequency of Orencia 125 mg sq injections once weekly.   IW:[id number] arthritis involving multiple sites with positive rheumatoid factor   Attempted to contact the patient and left message to advise patient he is due to update labs.   Okay to refill Orencia?

## 2020-06-04 ENCOUNTER — Other Ambulatory Visit: Payer: Self-pay | Admitting: *Deleted

## 2020-06-04 DIAGNOSIS — Z79899 Other long term (current) drug therapy: Secondary | ICD-10-CM

## 2020-06-04 NOTE — Progress Notes (Signed)
Office Visit Note  Patient: Daniel Griffin             Date of Birth: 1961/05/15           MRN: [id number]             PCP: Default, Provider, MD Referring: No ref. provider found Visit Date: 06/18/2020 Occupation: @GUAROCC @  Subjective:  Pain in both knee joints   History of Present Illness: Daniel Griffin is a 59 y.o. male with history of seropositive rheumatoid arthritis and osteoarthritis.  Patient is on Orencia 1 or 25 mg subcu injections once weekly.  Increase the frequency of Orencia from every 10 days to every 7 days 8 weeks ago.  He has noticed an improvement in his joint pain and stiffness since increasing the frequency of dosing.  He has ongoing pain and stiffness in both knee joints.  His stiffness is most severe after sitting for prolonged periods of time.  He experiences stiffness in both hands especially first thing in the morning. He has not had any recent infections.  He has not received the COVID-19 vaccination and does not plan to at this time.      Activities of Daily Living:  Patient reports morning stiffness for 15 minutes.   Patient Reports nocturnal pain.  Difficulty dressing/grooming: Reports Difficulty climbing stairs: Reports Difficulty getting out of chair: Reports Difficulty using hands for taps, buttons, cutlery, and/or writing: Reports  Review of Systems  Constitutional: Negative for fatigue and night sweats.  HENT: Negative for mouth sores, mouth dryness and nose dryness.   Eyes: Negative for redness and dryness.  Respiratory: Negative for difficulty breathing.   Cardiovascular: Negative for chest pain, palpitations, hypertension, irregular heartbeat and swelling in legs/feet.  Gastrointestinal: Negative for constipation and diarrhea.  Endocrine: Negative for excessive thirst and increased urination.  Genitourinary: Negative for difficulty urinating and painful urination.  Musculoskeletal: Positive for arthralgias, joint pain, joint swelling  and morning stiffness. Negative for myalgias, muscle weakness, muscle tenderness and myalgias.  Skin: Negative for color change, rash, hair loss, nodules/bumps, skin tightness, ulcers and sensitivity to sunlight.  Allergic/Immunologic: Negative for susceptible to infections.  Neurological: Negative for dizziness, fainting, numbness, memory loss, night sweats and weakness.  Hematological: Positive for bruising/bleeding tendency. Negative for swollen glands.  Psychiatric/Behavioral: Negative for depressed mood and sleep disturbance. The patient is not nervous/anxious.     PMFS History:  Patient Active Problem List   Diagnosis Date Noted  . Pain in left knee 04/16/2019  . Transaminitis 07/04/2018  . Leukopenia 05/22/2018  . Rheumatoid arthritis involving multiple sites with positive rheumatoid factor ([place]) 11/30/2016  . High risk medications (not anticoagulants) long-term use 11/30/2016  . Pain in both hands 11/30/2016  . Pain in joints of both feet 11/30/2016    Past Medical History:  Diagnosis Date  . Arthritis     Family History  Problem Relation Age of Onset  . Alzheimer's disease Father   . Healthy Son   . Healthy Daughter   . Rheum arthritis Daughter   . Healthy Daughter    Past Surgical History:  Procedure Laterality Date  . APPENDECTOMY     Social History   Social History Narrative  . Not on file   Immunization History  Administered Date(s) Administered  . Influenza,inj,Quad PF,6+ Mos 07/04/2018     Objective: Vital Signs: BP 139/84 (BP Location: Left Arm, Patient Position: Sitting, Cuff Size: Normal)   Pulse 71   Resp 16  Ht 5\' 7"  (1.702 m)   Wt 216 lb (98 kg)   BMI 33.83 kg/m    Physical Exam Vitals and nursing note reviewed.  Constitutional:      Appearance: He is well-developed.  HENT:     Head: Normocephalic and atraumatic.  Eyes:     Conjunctiva/sclera: Conjunctivae normal.     Pupils: Pupils are equal, round, and reactive to light.    Pulmonary:     Effort: Pulmonary effort is normal.  Abdominal:     Palpations: Abdomen is soft.  Musculoskeletal:     Cervical back: Normal range of motion and neck supple.  Skin:    General: Skin is warm and dry.     Capillary Refill: Capillary refill takes less than 2 seconds.  Neurological:     Mental Status: He is alert and oriented to person, place, and time.  Psychiatric:        Behavior: Behavior normal.      Musculoskeletal Exam: C-spine has slightly limited range of motion with lateral rotation.  Thoracic and lumbar spine have good range of motion.  Shoulder joints, elbow joints, wrist joints, MCPs, PIPs, DIPs have good range of motion with no synovitis.  He is able to make a complete fist bilaterally.  Synovial thickening of the right second MCP joint noted.  Hip joints have good range of motion with some discomfort in the left hip.  Knee joints have discomfort and stiffness with range of motion.  No warmth or effusion noted.  Bilateral knee crepitus noted.  Ankle joints have good range of motion with no tenderness or inflammation.  CDAI Exam: CDAI Score: 0.8  Patient Global: 4 mm; Provider Global: 4 mm Swollen: 0 ; Tender: 0  Joint Exam 06/18/2020   No joint exam has been documented for this visit   There is currently no information documented on the homunculus. Go to the Rheumatology activity and complete the homunculus joint exam.  Investigation: No additional findings.  Imaging: No results found.  Recent Labs: Lab Results  Component Value Date   WBC 6.3 06/04/2020   HGB 14.7 06/04/2020   PLT 199 06/04/2020   NA 135 06/04/2020   K 4.3 06/04/2020   CL 102 06/04/2020   CO2 23 06/04/2020   GLUCOSE 110 (H) 06/04/2020   BUN 8 06/04/2020   CREATININE 0.78 06/04/2020   BILITOT 1.1 06/04/2020   ALKPHOS 77 08/08/2019   AST 31 06/04/2020   ALT 35 06/04/2020   PROT 7.3 06/04/2020   ALBUMIN 4.5 08/08/2019   CALCIUM 9.2 06/04/2020   GFRAA 115 06/04/2020    QFTBGOLD NEGATIVE 07/03/2017   QFTBGOLDPLUS Negative 08/08/2019    Speciality Comments: Prior therapy includes: Enbrel (pancytopenia) and methotrexate (inadequate response)  Procedures:  No procedures performed Allergies: Patient has no known allergies.   Assessment / Plan:     Visit Diagnoses: Rheumatoid arthritis involving multiple sites with positive rheumatoid factor [facility]): He has no joint tenderness or synovitis on exam.  Overall he has noticed clinical improvement since increasing the frequency of Orencia dosing from every 10 days to every 7 days for the past 8 weeks.  He has not missed any doses of Orencia recently.  He continues to experience chronic pain and stiffness in both knee joints.  He has discomfort and stiffness with range of motion but no warmth or effusion noted on exam today.  He declined updated x-rays of both knees.  We discussed different treatment options but he declined a cortisone injection,  applying for Visco gel injections, or physical therapy.  He was encouraged to use Voltaren gel topically as needed for pain relief.  He will continue on Orencia 1 or 25 mg subcutaneous injections every 7 days.  We will continue to monitor his lab work closely.  He was advised to notify us if he develops increased joint pain or joint swelling.  He will follow-up in the office in 5 months.  High risk medications (not anticoagulants) long-term use - Orencia 125 mg every 7 days (started on 07/09/18), d/c enbrel-neutropenia.  CBC and CMP were updated on 06/04/20 and were reviewed with the patient today in the office. WBC count was WNL-6.3.  We will continue to monitor lab work closely.  He will return for lab work in January and every 3 months.  TB gold negative on 08/08/19 and a future order was placed today.  - Plan: QuantiFERON-TB Gold Plus He has not had any recent infections.  We discussed the importance of holding orencia if he develops signs or symptoms of an infection and to resume  once the infection has completely cleared.  He has not received the covid-19 vaccine and does not plan to. He was given a handout of information about the covid-19 vaccine today.   Primary osteoarthritis of both hands: He has PIP and DIP thickening consistent with osteoarthritis of both hands.  No joint tenderness or inflammation was noted.  He experiences discomfort and stiffness in both hands every morning.  He has a physically demanding job which exacerbates his hand pain.  Joint protection and muscle strengthening were discussed.  Primary osteoarthritis of both feet: He is not experiencing any discomfort in his feet at this time.   Other decreased white blood cell count: WBC count was 6.3 on 06/04/20.  We will continue to monitor lab work closely.   Screening for tuberculosis - TB gold negative on 08/08/19.  Future order for TB gold placed today. Plan: QuantiFERON-TB Gold Plus  Chronic pain of both knees: He presents today with increased pain in both knee joints.  He attributes most of his discomfort due to his physically demanding job.  He experiences significant stiffness in both knee joints in the morning and after sitting for prolonged periods of time. He has painful ROM of both knee joints with crepitus on exam.  No warmth or effusion noted.  He declined updated x-rays today.  Different treatment options were discussed today.  He declined a cortisone injection or applying for visco gel injections.  He was advised to take tylenol arthritis or use voltaren gel topically as needed for pain relief.  He was advised to notify us if his knee joint pain persists or worsens.   Orders: Orders Placed This Encounter  Procedures  . QuantiFERON-TB Gold Plus   No orders of the defined types were placed in this encounter.   Follow-Up Instructions: Return in about 5 months (around 11/16/2020) for Rheumatoid arthritis.   Ofilia Neas, PA-C  Note - This record has been created using Dragon software.    Chart creation errors have been sought, but may not always  have been located. Such creation errors do not reflect on  the standard of medical care.

## 2020-06-05 LAB — COMPLETE METABOLIC PANEL WITH GFR
AG Ratio: 1.4 (calc) (ref 1.0–2.5)
ALT: 35 U/L (ref 9–46)
AST: 31 U/L (ref 10–35)
Albumin: 4.2 g/dL (ref 3.6–5.1)
Alkaline phosphatase (APISO): 58 U/L (ref 35–144)
BUN: 8 mg/dL (ref 7–25)
CO2: 23 mmol/L (ref 20–32)
Calcium: 9.2 mg/dL (ref 8.6–10.3)
Chloride: 102 mmol/L (ref 98–110)
Creat: 0.78 mg/dL (ref 0.70–1.33)
GFR, Est African American: 115 mL/min/{1.73_m2} (ref >=60)
GFR, Est Non African American: 99 mL/min/{1.73_m2} (ref >=60)
Globulin: 3.1 g/dL (calc) (ref 1.9–3.7)
Glucose, Bld: 110 mg/dL — ABNORMAL HIGH (ref 65–99)
Potassium: 4.3 mmol/L (ref 3.5–5.3)
Sodium: 135 mmol/L (ref 135–146)
Total Bilirubin: 1.1 mg/dL (ref 0.2–1.2)
Total Protein: 7.3 g/dL (ref 6.1–8.1)

## 2020-06-05 LAB — CBC WITH DIFFERENTIAL/PLATELET
Absolute Monocytes: 819 cells/uL (ref 200–950)
Basophils Absolute: 50 cells/uL (ref 0–200)
Basophils Relative: 0.8 %
Eosinophils Absolute: 82 cells/uL (ref 15–500)
Eosinophils Relative: 1.3 %
HCT: 41.1 % (ref 38.5–50.0)
Hemoglobin: 14.7 g/dL (ref 13.2–17.1)
Lymphs Abs: 1285 cells/uL (ref 850–3900)
MCH: 34.9 pg — ABNORMAL HIGH (ref 27.0–33.0)
MCHC: 35.8 g/dL (ref 32.0–36.0)
MCV: 97.6 fL (ref 80.0–100.0)
MPV: 9.7 fL (ref 7.5–12.5)
Monocytes Relative: 13 %
Neutro Abs: 4064 cells/uL (ref 1500–7800)
Neutrophils Relative %: 64.5 %
Platelets: 199 10*3/uL (ref 140–400)
RBC: 4.21 10*6/uL (ref 4.20–5.80)
RDW: 11.8 % (ref 11.0–15.0)
Total Lymphocyte: 20.4 %
WBC: 6.3 10*3/uL (ref 3.8–10.8)

## 2020-06-05 NOTE — Progress Notes (Signed)
Labs are stable.

## 2020-06-15 ENCOUNTER — Other Ambulatory Visit: Payer: Self-pay | Admitting: Rheumatology

## 2020-06-15 ENCOUNTER — Other Ambulatory Visit (HOSPITAL_COMMUNITY): Payer: Self-pay | Admitting: Rheumatology

## 2020-06-15 NOTE — Telephone Encounter (Signed)
Last Visit:12/30/2019 Next Visit:06/18/2020 Labs:06/04/2020 neg  TB Gold:08/08/2019 Neg  Current Dose perlab note 12/30/2019:Dr. Deveshwar is okay with the patient increasing the frequency of Orencia 125 mg sq injections once weekly.   MV:AEPNTBHGRJ arthritis involving multiple sites with positive rheumatoid factor  Okay to refill per Dr. Estanislado Pandy

## 2020-06-17 MED FILL — ORENCIA CLICKJECT 125 MG/ML: 125 | 28 days supply | Qty: 4 | Fill #0

## 2020-06-18 ENCOUNTER — Other Ambulatory Visit (HOSPITAL_COMMUNITY): Payer: Self-pay | Admitting: Physician Assistant

## 2020-06-18 ENCOUNTER — Ambulatory Visit (INDEPENDENT_AMBULATORY_CARE_PROVIDER_SITE_OTHER): Payer: BC Managed Care – PPO | Admitting: Physician Assistant

## 2020-06-18 ENCOUNTER — Other Ambulatory Visit: Payer: Self-pay

## 2020-06-18 ENCOUNTER — Encounter: Payer: Self-pay | Admitting: Physician Assistant

## 2020-06-18 VITALS — BP 139/84 | HR 71 | Resp 16 | Ht 67.0 in | Wt 216.0 lb

## 2020-06-18 DIAGNOSIS — M19072 Primary osteoarthritis, left ankle and foot: Secondary | ICD-10-CM

## 2020-06-18 DIAGNOSIS — Z79899 Other long term (current) drug therapy: Secondary | ICD-10-CM | POA: Diagnosis not present

## 2020-06-18 DIAGNOSIS — M0579 Rheumatoid arthritis with rheumatoid factor of multiple sites without organ or systems involvement: Secondary | ICD-10-CM | POA: Diagnosis not present

## 2020-06-18 DIAGNOSIS — Z111 Encounter for screening for respiratory tuberculosis: Secondary | ICD-10-CM

## 2020-06-18 DIAGNOSIS — M25561 Pain in right knee: Secondary | ICD-10-CM

## 2020-06-18 DIAGNOSIS — M19071 Primary osteoarthritis, right ankle and foot: Secondary | ICD-10-CM

## 2020-06-18 DIAGNOSIS — M19042 Primary osteoarthritis, left hand: Secondary | ICD-10-CM

## 2020-06-18 DIAGNOSIS — M19041 Primary osteoarthritis, right hand: Secondary | ICD-10-CM | POA: Diagnosis not present

## 2020-06-18 DIAGNOSIS — M25562 Pain in left knee: Secondary | ICD-10-CM

## 2020-06-18 DIAGNOSIS — G8929 Other chronic pain: Secondary | ICD-10-CM

## 2020-06-18 DIAGNOSIS — D72818 Other decreased white blood cell count: Secondary | ICD-10-CM

## 2020-06-18 MED ORDER — ORENCIA CLICKJECT 125 MG/ML ~~LOC~~ SOAJ: 125.0000 mg | SUBCUTANEOUS | 0 refills | Status: DC

## 2020-06-18 NOTE — Patient Instructions (Addendum)
Standing Labs We placed an order today for your standing lab work.   Please have your standing labs drawn in January and every 3 months   If possible, please have your labs drawn 2 weeks prior to your appointment so that the provider can discuss your results at your appointment.  We have open lab daily Monday through Thursday from 8:30-12:30 PM and 1:30-4:30 PM and Friday from 8:30-12:30 PM and 1:30-4:00 PM at the office of Dr. Bo Merino, [facility].   Please be advised, patients with office appointments requiring lab work will take precedents over walk-in lab work.  If possible, please come for your lab work on Monday and Friday afternoons, as you may experience shorter wait times. The office is located at [address] [place], [place], [place] [address] No appointment is necessary.   Labs are drawn by [facility]. Please bring your co-pay at the time of your lab draw.  You may receive a bill from [place] for your lab work.  If you wish to have your labs drawn at another location, please call the office 24 hours in advance to send orders.  If you have any questions regarding directions or hours of operation,  please call [phone].   As a reminder, please drink plenty of water prior to coming for your lab work. Thanks!   COVID-19 vaccine recommendations:   COVID-19 vaccine is recommended for everyone (unless you are allergic to a vaccine component), even if you are on a medication that suppresses your immune system.   If you are on Methotrexate, Cellcept (mycophenolate), Rinvoq, Morrie Sheldon, and Olumiant- hold the medication for 1 week after each vaccine. Hold Methotrexate for 2 weeks after the single dose COVID-19 vaccine.   If you are on Orencia subcutaneous injection - hold medication one week prior to and one week after the first COVID-19 vaccine dose (only).   If you are on Orencia IV infusions- time vaccination administration so that the first COVID-19  vaccination will occur four weeks after the infusion and postpone the subsequent infusion by one week.   If you are on Cyclophosphamide or Rituxan infusions please contact your doctor prior to receiving the COVID-19 vaccine.   Do not take Tylenol or any anti-inflammatory medications (NSAIDs) 24 hours prior to the COVID-19 vaccination.   There is no direct evidence about the efficacy of the COVID-19 vaccine in individuals who are on medications that suppress the immune system.   Even if you are fully vaccinated, and you are on any medications that suppress your immune system, please continue to wear a mask, maintain at least six feet social distance and practice hand hygiene.   If you develop a COVID-19 infection, please contact your PCP or our office to determine if you need monoclonal antibody infusion.  The booster vaccine is now available for immunocompromised patients.   Please see the following web sites for updated information.   https://[email or web address].org/Portals/0/Files/COVID-19-Vaccination-Patient-Resources.pdf    Journal for Nurse Practitioners, 15(4), (812) 285-4031. Retrieved May 27, 2018 from http://clinicalkey.com/nursing">  Knee Exercises Ask your health care provider which exercises are safe for you. Do exercises exactly as told by your health care provider and adjust them as directed. It is normal to feel mild stretching, pulling, tightness, or discomfort as you do these exercises. Stop right away if you feel sudden pain or your pain gets worse. Do not begin these exercises until told by your health care provider. Stretching and range-of-motion exercises These exercises warm up your muscles and joints and improve  the movement and flexibility of your knee. These exercises also help to relieve pain and swelling. Knee extension, prone 1. Lie on your abdomen (prone position) on a bed. 2. Place your left / right knee just beyond the edge of the surface so your knee is not on the  bed. You can put a towel under your left / right thigh just above your kneecap for comfort. 3. Relax your leg muscles and allow gravity to straighten your knee (extension). You should feel a stretch behind your left / right knee. 4. Hold this position for __________ seconds. 5. Scoot up so your knee is supported between repetitions. Repeat __________ times. Complete this exercise __________ times a day. Knee flexion, active  1. Lie on your back with both legs straight. If this causes back discomfort, bend your left / right knee so your foot is flat on the floor. 2. Slowly slide your left / right heel back toward your buttocks. Stop when you feel a gentle stretch in the front of your knee or thigh (flexion). 3. Hold this position for __________ seconds. 4. Slowly slide your left / right heel back to the starting position. Repeat __________ times. Complete this exercise __________ times a day. Quadriceps stretch, prone  1. Lie on your abdomen on a firm surface, such as a bed or padded floor. 2. Bend your left / right knee and hold your ankle. If you cannot reach your ankle or pant leg, loop a belt around your foot and grab the belt instead. 3. Gently pull your heel toward your buttocks. Your knee should not slide out to the side. You should feel a stretch in the front of your thigh and knee (quadriceps). 4. Hold this position for __________ seconds. Repeat __________ times. Complete this exercise __________ times a day. Hamstring, supine 1. Lie on your back (supine position). 2. Loop a belt or towel over the ball of your left / right foot. The ball of your foot is on the walking surface, right under your toes. 3. Straighten your left / right knee and slowly pull on the belt to raise your leg until you feel a gentle stretch behind your knee (hamstring). ? Do not let your knee bend while you do this. ? Keep your other leg flat on the floor. 4. Hold this position for __________ seconds. Repeat  __________ times. Complete this exercise __________ times a day. Strengthening exercises These exercises build strength and endurance in your knee. Endurance is the ability to use your muscles for a long time, even after they get tired. Quadriceps, isometric This exercise stretches the muscles in front of your thigh (quadriceps) without moving your knee joint (isometric). 1. Lie on your back with your left / right leg extended and your other knee bent. Put a rolled towel or small pillow under your knee if told by your health care provider. 2. Slowly tense the muscles in the front of your left / right thigh. You should see your kneecap slide up toward your hip or see increased dimpling just above the knee. This motion will push the back of the knee toward the floor. 3. For __________ seconds, hold the muscle as tight as you can without increasing your pain. 4. Relax the muscles slowly and completely. Repeat __________ times. Complete this exercise __________ times a day. Straight leg raises This exercise stretches the muscles in front of your thigh (quadriceps) and the muscles that move your hips (hip flexors). 1. Lie on your back with your  left / right leg extended and your other knee bent. 2. Tense the muscles in the front of your left / right thigh. You should see your kneecap slide up or see increased dimpling just above the knee. Your thigh may even shake a bit. 3. Keep these muscles tight as you raise your leg 4-6 inches (10-15 cm) off the floor. Do not let your knee bend. 4. Hold this position for __________ seconds. 5. Keep these muscles tense as you lower your leg. 6. Relax your muscles slowly and completely after each repetition. Repeat __________ times. Complete this exercise __________ times a day. Hamstring, isometric 1. Lie on your back on a firm surface. 2. Bend your left / right knee about __________ degrees. 3. Dig your left / right heel into the surface as if you are trying to  pull it toward your buttocks. Tighten the muscles in the back of your thighs (hamstring) to "dig" as hard as you can without increasing any pain. 4. Hold this position for __________ seconds. 5. Release the tension gradually and allow your muscles to relax completely for __________ seconds after each repetition. Repeat __________ times. Complete this exercise __________ times a day. Hamstring curls If told by your health care provider, do this exercise while wearing ankle weights. Begin with __________ lb weights. Then increase the weight by 1 lb (0.5 kg) increments. Do not wear ankle weights that are more than __________ lb. 1. Lie on your abdomen with your legs straight. 2. Bend your left / right knee as far as you can without feeling pain. Keep your hips flat against the floor. 3. Hold this position for __________ seconds. 4. Slowly lower your leg to the starting position. Repeat __________ times. Complete this exercise __________ times a day. Squats This exercise strengthens the muscles in front of your thigh and knee (quadriceps). 1. Stand in front of a table, with your feet and knees pointing straight ahead. You may rest your hands on the table for balance but not for support. 2. Slowly bend your knees and lower your hips like you are going to sit in a chair. ? Keep your weight over your heels, not over your toes. ? Keep your lower legs upright so they are parallel with the table legs. ? Do not let your hips go lower than your knees. ? Do not bend lower than told by your health care provider. ? If your knee pain increases, do not bend as low. 3. Hold the squat position for __________ seconds. 4. Slowly push with your legs to return to standing. Do not use your hands to pull yourself to standing. Repeat __________ times. Complete this exercise __________ times a day. Wall slides This exercise strengthens the muscles in front of your thigh and knee (quadriceps). 1. Lean your back against  a smooth wall or door, and walk your feet out 18-24 inches (46-61 cm) from it. 2. Place your feet hip-width apart. 3. Slowly slide down the wall or door until your knees bend __________ degrees. Keep your knees over your heels, not over your toes. Keep your knees in line with your hips. 4. Hold this position for __________ seconds. Repeat __________ times. Complete this exercise __________ times a day. Straight leg raises This exercise strengthens the muscles that rotate the leg at the hip and move it away from your body (hip abductors). 1. Lie on your side with your left / right leg in the top position. Lie so your head, shoulder, knee, and hip  line up. You may bend your bottom knee to help you keep your balance. 2. Roll your hips slightly forward so your hips are stacked directly over each other and your left / right knee is facing forward. 3. Leading with your heel, lift your top leg 4-6 inches (10-15 cm). You should feel the muscles in your outer hip lifting. ? Do not let your foot drift forward. ? Do not let your knee roll toward the ceiling. 4. Hold this position for __________ seconds. 5. Slowly return your leg to the starting position. 6. Let your muscles relax completely after each repetition. Repeat __________ times. Complete this exercise __________ times a day. Straight leg raises This exercise stretches the muscles that move your hips away from the front of the pelvis (hip extensors). 1. Lie on your abdomen on a firm surface. You can put a pillow under your hips if that is more comfortable. 2. Tense the muscles in your buttocks and lift your left / right leg about 4-6 inches (10-15 cm). Keep your knee straight as you lift your leg. 3. Hold this position for __________ seconds. 4. Slowly lower your leg to the starting position. 5. Let your leg relax completely after each repetition. Repeat __________ times. Complete this exercise __________ times a day. This information is not  intended to replace advice given to you by your health care provider. Make sure you discuss any questions you have with your health care provider. Document Revised: 05/28/2018 Document Reviewed: 05/28/2018 Elsevier Patient Education  2020 [employer].

## 2020-06-18 NOTE — Progress Notes (Unsigned)
Refill requested at appointment today. Please review orencia prescription and send to the pharmacy.

## 2020-06-21 ENCOUNTER — Observation Stay (HOSPITAL_COMMUNITY)
Admission: EM | Admit: 2020-06-21 | Discharge: 2020-06-23 | Disposition: A | Discharge status: Home / Self Care | Payer: BC Managed Care – PPO | Attending: Internal Medicine | Admitting: Internal Medicine

## 2020-06-21 ENCOUNTER — Encounter (HOSPITAL_COMMUNITY): Payer: Self-pay

## 2020-06-21 ENCOUNTER — Emergency Department (HOSPITAL_COMMUNITY): Payer: BC Managed Care – PPO

## 2020-06-21 ENCOUNTER — Other Ambulatory Visit: Payer: Self-pay

## 2020-06-21 DIAGNOSIS — M069 Rheumatoid arthritis, unspecified: Secondary | ICD-10-CM | POA: Insufficient documentation

## 2020-06-21 DIAGNOSIS — R4182 Altered mental status, unspecified: Secondary | ICD-10-CM | POA: Diagnosis not present

## 2020-06-21 DIAGNOSIS — Z20822 Contact with and (suspected) exposure to covid-19: Secondary | ICD-10-CM | POA: Diagnosis not present

## 2020-06-21 DIAGNOSIS — M0520 Rheumatoid vasculitis with rheumatoid arthritis of unspecified site: Secondary | ICD-10-CM

## 2020-06-21 DIAGNOSIS — F1010 Alcohol abuse, uncomplicated: Secondary | ICD-10-CM | POA: Diagnosis not present

## 2020-06-21 DIAGNOSIS — J3489 Other specified disorders of nose and nasal sinuses: Secondary | ICD-10-CM | POA: Diagnosis not present

## 2020-06-21 DIAGNOSIS — R29810 Facial weakness: Secondary | ICD-10-CM | POA: Diagnosis not present

## 2020-06-21 DIAGNOSIS — R4781 Slurred speech: Secondary | ICD-10-CM | POA: Diagnosis not present

## 2020-06-21 DIAGNOSIS — G9341 Metabolic encephalopathy: Secondary | ICD-10-CM | POA: Diagnosis not present

## 2020-06-21 DIAGNOSIS — F1020 Alcohol dependence, uncomplicated: Secondary | ICD-10-CM | POA: Diagnosis not present

## 2020-06-21 DIAGNOSIS — R569 Unspecified convulsions: Secondary | ICD-10-CM

## 2020-06-21 DIAGNOSIS — R531 Weakness: Secondary | ICD-10-CM | POA: Diagnosis not present

## 2020-06-21 DIAGNOSIS — R404 Transient alteration of awareness: Secondary | ICD-10-CM | POA: Diagnosis not present

## 2020-06-21 DIAGNOSIS — R9431 Abnormal electrocardiogram [ECG] [EKG]: Secondary | ICD-10-CM | POA: Diagnosis not present

## 2020-06-21 DIAGNOSIS — R410 Disorientation, unspecified: Secondary | ICD-10-CM | POA: Diagnosis not present

## 2020-06-21 HISTORY — DX: Unspecified convulsions: R56.9

## 2020-06-21 LAB — URINALYSIS, ROUTINE W REFLEX MICROSCOPIC
Bacteria, UA: NONE SEEN
Bilirubin Urine: NEGATIVE
Glucose, UA: NEGATIVE mg/dL
Hgb urine dipstick: NEGATIVE
Ketones, ur: NEGATIVE mg/dL
Leukocytes,Ua: NEGATIVE
Nitrite: NEGATIVE
Protein, ur: 30 mg/dL — AB
Specific Gravity, Urine: 1.017 (ref 1.005–1.030)
pH: 8 (ref 5.0–8.0)

## 2020-06-21 LAB — CBC
HCT: 40.5 % (ref 39.0–52.0)
Hemoglobin: 13.8 g/dL (ref 13.0–17.0)
MCH: 34.5 pg — ABNORMAL HIGH (ref 26.0–34.0)
MCHC: 34.1 g/dL (ref 30.0–36.0)
MCV: 101.3 fL — ABNORMAL HIGH (ref 80.0–100.0)
Platelets: 181 10*3/uL (ref 150–400)
RBC: 4 MIL/uL — ABNORMAL LOW (ref 4.22–5.81)
RDW: 12.2 % (ref 11.5–15.5)
WBC: 6.2 10*3/uL (ref 4.0–10.5)
nRBC: 0 % (ref 0.0–0.2)

## 2020-06-21 LAB — RESPIRATORY PANEL BY RT PCR (FLU A&B, COVID)
Influenza A by PCR: NEGATIVE
Influenza B by PCR: NEGATIVE
SARS Coronavirus 2 by RT PCR: NEGATIVE

## 2020-06-21 LAB — DIFFERENTIAL
Abs Immature Granulocytes: 0.02 10*3/uL (ref 0.00–0.07)
Basophils Absolute: 0 10*3/uL (ref 0.0–0.1)
Basophils Relative: 0 %
Eosinophils Absolute: 0 10*3/uL (ref 0.0–0.5)
Eosinophils Relative: 0 %
Immature Granulocytes: 0 %
Lymphocytes Relative: 9 %
Lymphs Abs: 0.5 10*3/uL — ABNORMAL LOW (ref 0.7–4.0)
Monocytes Absolute: 0.6 10*3/uL (ref 0.1–1.0)
Monocytes Relative: 9 %
Neutro Abs: 5.1 10*3/uL (ref 1.7–7.7)
Neutrophils Relative %: 82 %

## 2020-06-21 LAB — COMPREHENSIVE METABOLIC PANEL
ALT: 32 U/L (ref 0–44)
AST: 30 U/L (ref 15–41)
Albumin: 3.8 g/dL (ref 3.5–5.0)
Alkaline Phosphatase: 48 U/L (ref 38–126)
Anion gap: 9 (ref 5–15)
BUN: 9 mg/dL (ref 6–20)
CO2: 26 mmol/L (ref 22–32)
Calcium: 8.5 mg/dL — ABNORMAL LOW (ref 8.9–10.3)
Chloride: 100 mmol/L (ref 98–111)
Creatinine, Ser: 0.89 mg/dL (ref 0.61–1.24)
GFR, Estimated: >60 mL/min (ref >60)
Glucose, Bld: 147 mg/dL — ABNORMAL HIGH (ref 70–99)
Potassium: 3.4 mmol/L — ABNORMAL LOW (ref 3.5–5.1)
Sodium: 135 mmol/L (ref 135–145)
Total Bilirubin: 1.3 mg/dL — ABNORMAL HIGH (ref 0.3–1.2)
Total Protein: 7.1 g/dL (ref 6.5–8.1)

## 2020-06-21 LAB — RAPID URINE DRUG SCREEN, HOSP PERFORMED
Amphetamines: NOT DETECTED
Barbiturates: NOT DETECTED
Benzodiazepines: NOT DETECTED
Cocaine: NOT DETECTED
Opiates: NOT DETECTED
Tetrahydrocannabinol: POSITIVE — AB

## 2020-06-21 LAB — ETHANOL
Alcohol, Ethyl (B): <10 mg/dL (ref <10)
Alcohol, Ethyl (B): <10 mg/dL (ref <10)

## 2020-06-21 LAB — APTT: aPTT: 23 seconds — ABNORMAL LOW (ref 24–36)

## 2020-06-21 LAB — PROTIME-INR
INR: 1 (ref 0.8–1.2)
Prothrombin Time: 12.9 seconds (ref 11.4–15.2)

## 2020-06-21 MED ORDER — ASPIRIN 325 MG PO TABS
325.0000 mg | ORAL_TABLET | Freq: Once | ORAL | Status: AC
  Administered 2020-06-21: 325 mg via ORAL
  Filled 2020-06-21: qty 1

## 2020-06-21 MED ORDER — THIAMINE HCL 100 MG PO TABS
100.0000 mg | ORAL_TABLET | Freq: Every day | ORAL | Status: DC
  Administered 2020-06-22 – 2020-06-23 (×2): 100 mg via ORAL
  Filled 2020-06-21 (×2): qty 1

## 2020-06-21 MED ORDER — ACETAMINOPHEN 650 MG RE SUPP: 650.0000 mg | Freq: Four times a day (QID) | RECTAL | Status: DC | PRN

## 2020-06-21 MED ORDER — ACETAMINOPHEN 325 MG PO TABS: 650.0000 mg | ORAL_TABLET | Freq: Four times a day (QID) | ORAL | Status: DC | PRN

## 2020-06-21 MED ORDER — LORAZEPAM 1 MG PO TABS: 1.0000 mg | ORAL_TABLET | ORAL | Status: DC | PRN

## 2020-06-21 MED ORDER — POTASSIUM CHLORIDE CRYS ER 20 MEQ PO TBCR
40.0000 meq | EXTENDED_RELEASE_TABLET | Freq: Once | ORAL | Status: AC
  Administered 2020-06-21: 40 meq via ORAL
  Filled 2020-06-21: qty 2

## 2020-06-21 MED ORDER — FOLIC ACID 1 MG PO TABS
1.0000 mg | ORAL_TABLET | Freq: Every day | ORAL | Status: DC
  Administered 2020-06-22 – 2020-06-23 (×2): 1 mg via ORAL
  Filled 2020-06-21 (×2): qty 1

## 2020-06-21 MED ORDER — ENOXAPARIN SODIUM 40 MG/0.4ML ~~LOC~~ SOLN
40.0000 mg | SUBCUTANEOUS | Status: DC
  Administered 2020-06-22 – 2020-06-23 (×2): 40 mg via SUBCUTANEOUS
  Filled 2020-06-21 (×2): qty 0.4

## 2020-06-21 MED ORDER — ABATACEPT 125 MG/ML ~~LOC~~ SOAJ: 125.0000 mg | SUBCUTANEOUS | Status: DC

## 2020-06-21 MED ORDER — THIAMINE HCL 100 MG/ML IJ SOLN: 100.0000 mg | Freq: Every day | INTRAMUSCULAR | Status: DC

## 2020-06-21 MED ORDER — ONDANSETRON HCL 4 MG/2ML IJ SOLN: 4.0000 mg | Freq: Four times a day (QID) | INTRAMUSCULAR | Status: DC | PRN

## 2020-06-21 MED ORDER — ONDANSETRON HCL 4 MG PO TABS: 4.0000 mg | ORAL_TABLET | Freq: Four times a day (QID) | ORAL | Status: DC | PRN

## 2020-06-21 MED ORDER — POLYETHYLENE GLYCOL 3350 17 G PO PACK: 17.0000 g | PACK | Freq: Every day | ORAL | Status: DC | PRN

## 2020-06-21 MED ORDER — ADULT MULTIVITAMIN W/MINERALS CH
1.0000 | ORAL_TABLET | Freq: Every day | ORAL | Status: DC
  Administered 2020-06-22 – 2020-06-23 (×2): 1 via ORAL
  Filled 2020-06-21 (×2): qty 1

## 2020-06-21 MED ORDER — LORAZEPAM 2 MG/ML IJ SOLN: 1.0000 mg | INTRAMUSCULAR | Status: DC | PRN

## 2020-06-21 NOTE — ED Triage Notes (Signed)
Pt found in yard by neighbor not speaking correctly and drooling from left side of his mouth.  Called EMS who presents with complaints of garbled speech and left sided weakness.

## 2020-06-21 NOTE — Consult Note (Signed)
[facility] Tele[facility]rology Consult Services  Stat Consult  Date of Service:   06/21/2020 20:07:34  Impression:     .  R41.82 - AMS (Altered Mental Status)  Comments/Sign-Out: 59 year old male history of rheumatoid arthritis presents for altered mental status, now resolving and nearly back to baseline. Differential includes tox metabolic etiology, less likely stroke or seizure. NIH stroke scale of zero. Recommend toxic metabolic work-up and infectious work-up in the emergency department. If that is unremarkable, recommend admission for MRI imaging of the brain to rule out stroke as well as routine EEG, neurology follow-up.  CT HEAD: Showed No Acute Hemorrhage or Acute Core Infarct  Our recommendations are outlined below.  Diagnostic Studies: MRI brain w/wo contrast Routine EEG  Laboratory Studies: Check B12/folate TSH Ammonia CMP blood cultures x 2 ESR/CRP Lipid Panel HbA1c COIVD test  Nursing Recommendations: Delirium precautions: ?Blinds open during the day, closed at night, frequent reorientation, minimize nighttime interruptions When possible avoid benzodiazepines, opioid pain medications, and anticholinergic medications  Consultations: Toxic metabolic work up per primary team  DVT Prophylaxis: Choice of Primary Team  Disposition: Neurology will follow  Additional Recommendations:    Metrics: [facility] Notification Time: 06/21/2020 20:03:00 Stamp Time: 06/21/2020 20:07:34 Callback Response Time: 06/21/2020 20:04:01   ----------------------------------------------------------------------------------------------------  Chief Complaint: AMS  History of Present Illness: Patient is a 59 year old Male.  59 year old male history of rheumatoid arthritis presents the emergency department for an episode of confusion. Daughter is at bedside and helps provide history. Apparently, he was dropped off from work by a coworker at [employer] and  was apparently doing well at that time. Then, shortly after, he was noticed by family to be confused. He started walking out into the middle of a busy street. He seemed to not recognize family members and seemed to be disoriented and saying things that "did not make any sense". He was brought to the emergency department. Over the course of his emergency department stay, he has been rapidly doing much better, at this point time about 85% back to baseline per the daughter. Patient feels like he is for the most part back to himself. Never had the symptoms before. No history of strokes or seizures in the past. He is on any blood thinners. He is able to tell me his name and age as well as current month appropriately however he does think that it is the year 2000 and he is unable to tell me the current president's name as he does not remember. No lateralizing deficits currently however.       Examination: BP(168/84), Pulse(70), Blood Glucose(147) 1A: Level of Consciousness - Alert; keenly responsive + 0 1B: Ask Month and Age - Both Questions Right + 0 1C: Blink Eyes & Squeeze Hands - Performs Both Tasks + 0 2: Test Horizontal Extraocular Movements - Normal + 0 3: Test Visual Fields - No Visual Loss + 0 4: Test Facial Palsy (Use Grimace if Obtunded) - Normal symmetry + 0 5A: Test Left Arm Motor Drift - No Drift for 10 Seconds + 0 5B: Test Right Arm Motor Drift - No Drift for 10 Seconds + 0 6A: Test Left Leg Motor Drift - No Drift for 5 Seconds + 0 6B: Test Right Leg Motor Drift - No Drift for 5 Seconds + 0 7: Test Limb Ataxia (FNF/Heel-Shin) - No Ataxia + 0 8: Test Sensation - Normal; No sensory loss + 0 9: Test Language/Aphasia - Normal; No aphasia + 0 10: Test Dysarthria - Normal +  0 11: Test Extinction/Inattention - No abnormality + 0  NIHSS Score: 0   Patient/Family was informed the Neurology Consult would occur via TeleHealth consult by way of interactive audio and video telecommunications  and consented to receiving care in this manner.  Patient is being evaluated for possible acute neurologic impairment and high probability of imminent or life-threatening deterioration. I spent total of 35 minutes providing care to this patient, including time for face to face visit via telemedicine, review of medical records, imaging studies and discussion of findings with providers, the patient and/or family.   Dr Knox Royalty   [facility] [phone]  Case [id number]

## 2020-06-21 NOTE — H&P (Addendum)
History and Physical    Daniel Griffin RKY:[id number] DOB: 1960-10-28 DOA: 06/21/2020  PCP: No primary care provider on file.   Patient coming from: Home  I have personally briefly reviewed patient's old medical records in [place]  Chief Complaint: Confusion  HPI: Daniel Griffin is a 59 y.o. male with medical history significant for rheumatoid arthritis.  Patient was brought to the ED with reports of confusion.  Per triage notes, patient was found in the yard, -not speaking correctly and drooling from the left side of his mouth, reports of garbled speech peach and left-sided weakness.   On my evaluation unable to answer a few questions, daughter is present at bedside and helps with the history.  Patient's daughter reports that patient's older sister saw patient walking down towards the road with vehicles passing, he appeared to be stumbling, and was not aware of what he was doing.  Patient sister got patient to sit down, called daughter who lives down the road who arrived before EMS.  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 Patient's daughter reports that patient was confused, drooling from the left side of his mouth, he was not able to follow directions to grip her hands, but he was able to move his extremities.  He did not recognize her initially, and was not able to tell her name.   Garbled/abnormal speech was not noted, but patient was confused and not responding verbally to questions, but daughter feels that patient was unable to talk.  No witnessed jerking of extremities.  Patient denies weakness of his extremities.  He reports a mild frontal headache.   His speech is normal and fluent at this time, and he does not appear confused.  Patient denies history of migraines or seizures.  Family history of epilepsy in patient's older sister and one of her children.  Patient drinks 8-12 beers daily, he did not have  any drink today.  His last drink was last night at about 6:30 PM.  Denies smoking cigarettes, uses tobacco.  He does not take aspirin.  His only medication is Orencia for his rheumatoid arthritis.  ED Course: Blood pressure 168/84, O2 sat 94% on room air.  Heart rate 69.  Respiratory 23.  WBC 6.2.  Creatinine 0.89.  EKG shows sinus rhythm, no old EKG to compare.  Head CT without acute intracranial process.  Telemetry neurology consulted-recommended toxic metabolic and infectious work-up, if unremarkable admission for MRI brain to rule out stroke, routine EEG and neurology follow-up.    Review of Systems: As per HPI all other systems reviewed and negative.  Past Medical History:  Diagnosis Date  . Arthritis     History reviewed. No pertinent surgical history.   reports that he has never smoked. His smokeless tobacco use includes snuff. He reports current alcohol use of about 85.0 standard drinks of alcohol per week. He reports current drug use. Frequency: 2.00 times per week. Drug: Marijuana.  Family history of epilepsy in patient's older sister and one of her children.  Prior to Admission medications   Not on File    Physical Exam: Vitals:   06/21/20 2000  BP: (!) 168/84  Pulse: 69  Resp: (!) 23  SpO2: 94%    Constitutional: NAD, calm, comfortable Vitals:   06/21/20 2000  BP: (!) 168/84  Pulse: 69  Resp: (!) 23  SpO2: 94%   Eyes: PERRL, lids and conjunctivae normal ENMT: Mucous  membranes are moist. Posterior pharynx clear of any exudate or lesions.Normal dentition.  Neck: normal, supple, no masses, no thyromegaly Respiratory: clear to auscultation bilaterally, no wheezing, no crackles. Normal respiratory effort. No accessory muscle use.  Cardiovascular: Regular rate and rhythm, no murmurs / rubs / gallops. No extremity edema. 2+ pedal pulses.  Abdomen: no tenderness, no masses palpated. No hepatosplenomegaly. Bowel sounds positive.  Musculoskeletal: no clubbing /  cyanosis. No joint deformity upper and lower extremities. Good ROM, no contractures. Normal muscle tone.  Skin: no rashes, lesions, ulcers. No induration Neurologic: Neurological:     Mental Status: he is alert.     GCS: GCS eye subscore is 4. GCS verbal subscore is 5. GCS motor subscore is 6.     Comments: Mental Status:  Alert, oriented, thought content appropriate, able to give a coherent history. Speech fluent without evidence of aphasia. Able to follow 2 step commands without difficulty.  Cranial Nerves:  II:  Peripheral visual fields grossly normal, pupils equal, round, reactive to light III,IV, VI: ptosis not present, extra-ocular motions intact bilaterally  V,VII: smile symmetric, eyebrows raise symmetric, facial light touch sensation equal VIII: hearing grossly normal to voice  X: uvula elevates symmetrically  XI: bilateral shoulder shrug symmetric and strong XII: midline tongue extension without fassiculations Motor:  Normal tone. Equal full strength- 5/5 bilateral upper and lower extremities.  Strong grip strength bilaterally. Sensory: Sensation intact to light touch in all extremities.  Cerebellar: . No pronator drift.  CV: distal pulses palpable throughout  Psychiatric: Normal judgment and insight. Alert and oriented x 3. Normal mood.   Labs on Admission: I have personally reviewed following labs and imaging studies  CBC: Recent Labs  Lab 06/21/20 1950  WBC 6.2  NEUTROABS 5.1  HGB 13.8  HCT 40.5  MCV 101.3*  PLT 409   Basic Metabolic Panel: Recent Labs  Lab 06/21/20 1950  NA 135  K 3.4*  CL 100  CO2 26  GLUCOSE 147*  BUN 9  CREATININE 0.89  CALCIUM 8.5*   Liver Function Tests: Recent Labs  Lab 06/21/20 1950  AST 30  ALT 32  ALKPHOS 48  BILITOT 1.3*  PROT 7.1  ALBUMIN 3.8   Coagulation Profile: Recent Labs  Lab 06/21/20 1950  INR 1.0   Urine analysis:    Component Value Date/Time   COLORURINE YELLOW 06/21/2020 2053   APPEARANCEUR CLEAR  06/21/2020 2053   LABSPEC 1.017 06/21/2020 2053   PHURINE 8.0 06/21/2020 2053   GLUCOSEU NEGATIVE 06/21/2020 2053   HGBUR NEGATIVE 06/21/2020 2053   [place] NEGATIVE 06/21/2020 2053   [place] NEGATIVE 06/21/2020 2053   PROTEINUR 30 (A) 06/21/2020 2053   NITRITE NEGATIVE 06/21/2020 2053   LEUKOCYTESUR NEGATIVE 06/21/2020 2053    Radiological Exams on Admission: CT Head Wo Contrast  Result Date: 06/21/2020 CLINICAL DATA:  Confusion, left-sided weakness, delirium EXAM: CT HEAD WITHOUT CONTRAST TECHNIQUE: Contiguous axial images were obtained from the base of the skull through the vertex without intravenous contrast. COMPARISON:  None. FINDINGS: Brain: No acute infarct or hemorrhage. Lateral ventricles and midline structures are unremarkable. No acute extra-axial fluid collections. No mass effect. Vascular: No hyperdense vessel or unexpected calcification. Skull: Normal. Negative for fracture or focal lesion. Sinuses/Orbits: Mild mucosal thickening within the frontal, ethmoid, and sphenoid sinuses. Other: None. IMPRESSION: 1. No acute intracranial process. Electronically Signed   By: Randa Ngo M.D.   On: 06/21/2020 19:23    EKG: Independently reviewed.  Sinus rhythm QTc 447.  No  ST or T wave abnormalities.  No old EKG to compare.  Assessment/Plan Active Problems:   AMS (altered mental status)  Metabolic encephalopathy- history suggest an transient and acute neurologic event.  Mostly of confusion, left-sided drooling, possibly also with abnormal speech/speech difficulty.  Currently back to baseline.  History of alcohol abuse, 8-12 beers daily.  No history of alcohol withdrawals or seizures or migraines.  Head CT without acute abnormality.  Blood alcohol level less than 10.  UDS positive for marijuana.  Possible differentials-TIA, related to alcohol intake.  At this time he does not appear to be in withdrawal. -Telemetry neurology consulted-toxic and infectious metabolic work-up  recommended, check ammonia, ESR, CRP. -Obtain brain MRI with and without contrast -Lipid panel, hemoglobin A1c -Obtain TSH, magnesium, phosphorus -B12 folate levels in the morning -EEG -Aspirin 325x1 - Q4h neuro checks.  Alcohol abuse-drinks 8-12 beers daily, last drink was 6:30 PM last night, about 28 hours ago. -CIWA protocol as needed, -Thiamine folate multivitamins  Rheumatoid arthritis-stable. -Resume once weekly Orencia   DVT prophylaxis: Lovenox Code Status: Full code. Family Communication: Daughter at bedside.  Plan of care explained all questions answered. Disposition Plan: ~ 1 - 2 days Consults called: None. Admission status: Observation, telemetry  Bethena Roys MD [facility]  06/21/2020, 10:59 PM

## 2020-06-21 NOTE — ED Provider Notes (Addendum)
[facility] EMERGENCY DEPARTMENT Provider Note   CSN: [id number] Arrival date & time: 06/21/20  1906     History Chief Complaint  Patient presents with  . Aphasia    Daniel Griffin is a 59 y.o. male.  HPI Patient was brought in by EMS, for acute symptoms felt to be code stroke.  He has difficulty naming things correctly, can state his name but seems to be unable to state anything else.  He was found by his daughter, walking across the road, around 6 PM, in the state.  The patient is alert and cooperative, follows commands accurately when I saw him, as EMS arrived with him.  He was able to extend both hands, independently and hold them up without drift.  He independently raise each leg off the stretcher.  There is no facial asymmetry.  His speech is clear however he is confused, answering questions inappropriately.  I asked him where he was and he stated, "I am going to heaven." 7:02 PM-patient does not meet criteria for code stroke at this time.  He has an acute confusional state, based on initial exam.  Will evaluate with CT imaging then proceed with close observation and reassessment.    Past Medical History:  Diagnosis Date  . Arthritis     Patient Active Problem List   Diagnosis Date Noted  . AMS (altered mental status) 06/21/2020    History reviewed. No pertinent surgical history.     History reviewed. No pertinent family history.  Social History   Tobacco Use  . Smoking status: Never Smoker  . Smokeless tobacco: Current User    Types: Snuff  Vaping Use  . Vaping Use: Never assessed  Substance Use Topics  . Alcohol use: Yes    Alcohol/week: 85.0 standard drinks    Types: 85 Cans of beer per week    Comment: 12 pack a day  . Drug use: Yes    Frequency: 2.0 times per week    Types: Marijuana    Home Medications Prior to Admission medications   Medication Sig Start Date End Date Taking? Authorizing Provider  ORENCIA CLICKJECT 297 MG/ML SOAJ Inject 125 mg  into the skin once a week.  06/17/20  Yes [provider]    Allergies    Patient has no allergy information on record.  Review of Systems   Review of Systems  Physical Exam Updated Vital Signs BP (!) 166/84   Pulse 65   Resp (!) 21   SpO2 97%   Physical Exam  ED Results / Procedures / Treatments   Labs (all labs ordered are listed, but only abnormal results are displayed) Labs Reviewed  APTT - Abnormal; Notable for the following components:      Result Value   aPTT 23 (*)    All other components within normal limits  CBC - Abnormal; Notable for the following components:   RBC 4.00 (*)    MCV 101.3 (*)    MCH 34.5 (*)    All other components within normal limits  DIFFERENTIAL - Abnormal; Notable for the following components:   Lymphs Abs 0.5 (*)    All other components within normal limits  COMPREHENSIVE METABOLIC PANEL - Abnormal; Notable for the following components:   Potassium 3.4 (*)    Glucose, Bld 147 (*)    Calcium 8.5 (*)    Total Bilirubin 1.3 (*)    All other components within normal limits  RAPID URINE DRUG SCREEN, HOSP PERFORMED -  Abnormal; Notable for the following components:   Tetrahydrocannabinol POSITIVE (*)    All other components within normal limits  URINALYSIS, ROUTINE W REFLEX MICROSCOPIC - Abnormal; Notable for the following components:   Protein, ur 30 (*)    All other components within normal limits  RESPIRATORY PANEL BY RT PCR (FLU A&B, COVID)  ETHANOL  PROTIME-INR  ETHANOL  RAPID URINE DRUG SCREEN, HOSP PERFORMED  I-STAT CHEM 8, ED    EKG None    Date: 06/21/2020  Rate: 84  Rhythm: normal sinus rhythm  QRS Axis: Left axis deviation  PR and QT Intervals: normal  ST/T Wave abnormalities: normal  PR and QRS Conduction Disutrbances:none     Radiology CT Head Wo Contrast  Result Date: 06/21/2020 CLINICAL DATA:  Confusion, left-sided weakness, delirium EXAM: CT HEAD WITHOUT CONTRAST TECHNIQUE: Contiguous axial images  were obtained from the base of the skull through the vertex without intravenous contrast. COMPARISON:  None. FINDINGS: Brain: No acute infarct or hemorrhage. Lateral ventricles and midline structures are unremarkable. No acute extra-axial fluid collections. No mass effect. Vascular: No hyperdense vessel or unexpected calcification. Skull: Normal. Negative for fracture or focal lesion. Sinuses/Orbits: Mild mucosal thickening within the frontal, ethmoid, and sphenoid sinuses. Other: None. IMPRESSION: 1. No acute intracranial process. Electronically Signed   By: Randa Ngo M.D.   On: 06/21/2020 19:23    Procedures .Critical Care Performed by: Daleen Bo, MD Authorized by: Daleen Bo, MD   Critical care provider statement:    Critical care time (minutes):  65   Critical care start time:  06/21/2020 7:02 PM   Critical care end time:  06/21/2020 9:42 PM   Critical care time was exclusive of:  Separately billable procedures and treating other patients   Critical care was necessary to treat or prevent imminent or life-threatening deterioration of the following conditions:  CNS failure or compromise   Critical care was time spent personally by me on the following activities:  Blood draw for specimens, development of treatment plan with patient or surrogate, discussions with consultants, evaluation of patient's response to treatment, examination of patient, obtaining history from patient or surrogate, ordering and performing treatments and interventions, ordering and review of laboratory studies, pulse oximetry, re-evaluation of patient's condition, review of old charts and ordering and review of radiographic studies   (including critical care time)  Medications Ordered in ED Medications - No data to display  ED Course  I have reviewed the triage vital signs and the nursing notes.  Pertinent labs & imaging results that were available during my care of the patient were reviewed by me and  considered in my medical decision making (see chart for details).  Clinical Course as of Jun 21 2221  Mon Jun 21, 2020  1925 Reevaluated patient again.  He is able to speak some words clearly, other times tries to respond and clearly is frustrated, and speaking words are not intelligible.  I asked the secretary to put a call into teleneurology to discuss the case relative to need for more advanced interventions.   [EW]  1937 The patient's daughter is now here and states that the patient seems less confused than when she saw him earlier.  She states that now, "he can recognize me and said my name."  Patient's daughter, Angela Nevin states that her aunt called her to come to the patient's home, because the patient was walking toward the road, and she was worried that he might be hit by a car.  Patient  is unable to recall this event.  The patient speech has improved he is more articulate, speaking in longer sentences, and does not appear to have receptive or expressive aphasia at this time.  He does continue to be confused but obviously is brighter at this time when he was on arrival.   [EW]  1939 I discussed the case, with on-call teleneurologist, who will evaluate the patient by televideo monitoring, as soon as a cart becomes available.   [EW]  2117 I discussed the case with the teleneurologist who is evaluate the patient by televideo monitoring.  He feels like the patient is "85 to 90% better.  He recommends evaluation and treatment for possible toxic metabolic process versus occult stroke, versus seizure.  He recommends further evaluations with MRI, and EEG.  The patient does not showing lateralizing signs, or aphasia.   [EW]  2133 Normal  Urinalysis, Routine w reflex microscopic Urine, Clean Catch(!) [EW]  2134 Normal  CBC(!) [EW]  2134 Normal except potassium low, glucose high, calcium low  Comprehensive metabolic panel(!) [EW]  7591 Per radiologist, no acute abnormality.   [EW]    Clinical Course  User Index [EW] Daleen Bo, MD   MDM Rules/Calculators/A&P                           Patient Vitals for the past 24 hrs:  BP Pulse Resp SpO2  06/21/20 2200 (!) 166/84 65 (!) 21 97 %  06/21/20 2145 -- 68 (!) 21 96 %  06/21/20 2130 (!) 168/84 63 (!) 27 94 %  06/21/20 2115 -- 64 (!) 26 95 %  06/21/20 2100 (!) 171/82 67 (!) 28 95 %  06/21/20 2045 -- 71 (!) 23 96 %  06/21/20 2030 (!) 175/87 70 (!) 25 97 %  06/21/20 2000 (!) 168/84 69 (!) 23 94 %    9:35 PM Reevaluation with update and discussion. After initial assessment and treatment, an updated evaluation reveals progressive improvement in mental status, still somewhat disoriented.  Findings discussed with the patient all questions were answered. Daleen Bo   Medical Decision Making:  This patient is presenting for evaluation of altered mental status, which does require a range of treatment options, and is a complaint that involves a high risk of morbidity and mortality. The differential diagnoses include CVA, acute toxic disorder, metabolic disorder. I decided to review old records, and in summary healthy middle-aged male, presenting with acute confusional state, without evidence for acute stroke on arrival to the emergency department.  I obtained additional historical information from daughter at the bedside.  Clinical Laboratory Tests Ordered, included CBC, Metabolic panel, Urinalysis and Urine drug screen. Review indicates initial labs without acute disorders causing toxic or metabolic process to explain confusional state. Radiologic Tests Ordered, included CT head.  I independently Visualized: Radiographic images, which show no acute abnormalities.  Cardiac Monitor Tracing which shows normal sinus rhythm   Critical Interventions-clinical evaluation, after testing, CT imaging, neurologic consultation with teleneurology, observation reassessment  After These Interventions, the Patient was reevaluated and was found improving  but not completely resolved confusional state.  Initial evaluation does not indicate stroke.  MRI not available at this time, which she will require.  He will also require EEG testing.  CRITICAL CARE- Yes Performed by: Daleen Bo  Nursing Notes Reviewed/ Care Coordinated Applicable Imaging Reviewed Interpretation of Laboratory Data incorporated into ED treatment  10:22 PM-Consult complete with hospitalist. Patient case explained and discussed.  He agrees to admit patient for further evaluation and treatment. Call ended at 9:55 PM  Plan: Admit    Final Clinical Impression(s) / ED Diagnoses Final diagnoses:  Altered mental status, unspecified altered mental status type  Alcoholism [facility])    Rx / DC Orders ED Discharge Orders    None       Daleen Bo, MD 06/21/20 2156  10:15 PM-patient's daughter give additional history that the patient drinks 12 beers every day, at this time he states his last beer intake was yesterday.  He was informed that this is excessive and he needs to stop.  I will inform the admitting doctor.   Daleen Bo, MD 06/21/20 2222

## 2020-06-22 ENCOUNTER — Observation Stay (HOSPITAL_COMMUNITY)
Admit: 2020-06-22 | Discharge: 2020-06-22 | Disposition: A | Discharge status: Home / Self Care | Payer: BC Managed Care – PPO | Attending: Family Medicine | Admitting: Family Medicine

## 2020-06-22 ENCOUNTER — Other Ambulatory Visit: Payer: Self-pay

## 2020-06-22 ENCOUNTER — Observation Stay (HOSPITAL_COMMUNITY): Payer: BC Managed Care – PPO

## 2020-06-22 DIAGNOSIS — F1010 Alcohol abuse, uncomplicated: Secondary | ICD-10-CM | POA: Diagnosis not present

## 2020-06-22 DIAGNOSIS — G9341 Metabolic encephalopathy: Secondary | ICD-10-CM | POA: Diagnosis not present

## 2020-06-22 DIAGNOSIS — R410 Disorientation, unspecified: Secondary | ICD-10-CM | POA: Diagnosis not present

## 2020-06-22 DIAGNOSIS — M0520 Rheumatoid vasculitis with rheumatoid arthritis of unspecified site: Secondary | ICD-10-CM

## 2020-06-22 DIAGNOSIS — I6782 Cerebral ischemia: Secondary | ICD-10-CM | POA: Diagnosis not present

## 2020-06-22 DIAGNOSIS — M069 Rheumatoid arthritis, unspecified: Secondary | ICD-10-CM | POA: Diagnosis not present

## 2020-06-22 DIAGNOSIS — G319 Degenerative disease of nervous system, unspecified: Secondary | ICD-10-CM | POA: Diagnosis not present

## 2020-06-22 DIAGNOSIS — R531 Weakness: Secondary | ICD-10-CM | POA: Diagnosis not present

## 2020-06-22 DIAGNOSIS — R4182 Altered mental status, unspecified: Secondary | ICD-10-CM | POA: Diagnosis not present

## 2020-06-22 DIAGNOSIS — Z20822 Contact with and (suspected) exposure to covid-19: Secondary | ICD-10-CM | POA: Diagnosis not present

## 2020-06-22 LAB — BASIC METABOLIC PANEL
Anion gap: 7 (ref 5–15)
BUN: 8 mg/dL (ref 6–20)
CO2: 23 mmol/L (ref 22–32)
Calcium: 8.3 mg/dL — ABNORMAL LOW (ref 8.9–10.3)
Chloride: 103 mmol/L (ref 98–111)
Creatinine, Ser: 0.79 mg/dL (ref 0.61–1.24)
GFR, Estimated: >60 mL/min (ref >60)
Glucose, Bld: 112 mg/dL — ABNORMAL HIGH (ref 70–99)
Potassium: 3.9 mmol/L (ref 3.5–5.1)
Sodium: 133 mmol/L — ABNORMAL LOW (ref 135–145)

## 2020-06-22 LAB — HIV ANTIBODY (ROUTINE TESTING W REFLEX): HIV Screen 4th Generation wRfx: NONREACTIVE

## 2020-06-22 LAB — LIPID PANEL
Cholesterol: 139 mg/dL (ref 0–200)
HDL: 75 mg/dL (ref >40)
LDL Cholesterol: 54 mg/dL (ref 0–99)
Total CHOL/HDL Ratio: 1.9 RATIO
Triglycerides: 49 mg/dL (ref <150)
VLDL: 10 mg/dL (ref 0–40)

## 2020-06-22 LAB — FOLATE: Folate: 8.9 ng/mL (ref >5.9)

## 2020-06-22 LAB — PHOSPHORUS: Phosphorus: 1.9 mg/dL — ABNORMAL LOW (ref 2.5–4.6)

## 2020-06-22 LAB — SEDIMENTATION RATE: Sed Rate: 20 mm/hr — ABNORMAL HIGH (ref 0–16)

## 2020-06-22 LAB — MAGNESIUM: Magnesium: 2.1 mg/dL (ref 1.7–2.4)

## 2020-06-22 LAB — HEMOGLOBIN A1C
Hgb A1c MFr Bld: 5 % (ref 4.8–5.6)
Mean Plasma Glucose: 96.8 mg/dL

## 2020-06-22 LAB — VITAMIN B12: Vitamin B-12: 341 pg/mL (ref 180–914)

## 2020-06-22 LAB — AMMONIA: Ammonia: 44 umol/L — ABNORMAL HIGH (ref 9–35)

## 2020-06-22 MED ORDER — GADOBUTROL 1 MMOL/ML IV SOLN
10.0000 mL | Freq: Once | INTRAVENOUS | Status: AC | PRN
  Administered 2020-06-22: 10 mL via INTRAVENOUS

## 2020-06-22 NOTE — Evaluation (Signed)
Physical Therapy Evaluation Patient Details Name: Daniel Griffin MRN: [id number] DOB: 01-12-1961 Today's Date: 06/22/2020   History of Present Illness  59 YO male with PMH RA, presents to ED with confusion, L sided drooling.  Clinical Impression  Pt admitted with above diagnosis. PTA pt independent, working FT as heavy Psychologist, educational. Currently, pt strong and no deficits with heel to shin test bilaterally, but requiring min guard assist when ambulating with RW. Pt c/o L knee RA pain when ambulating, keeps knee flexed throughout gait cycle, initial contact on L ball of foot. Pt appears distracted during eval, slightly impulsive requiring cues, and occasionally requiring repeating question for response. Pt tolerates remaining up in chair with call bell in lap and daughter in room. Pt currently with functional limitations due to the deficits listed below (see PT Problem List). Pt will benefit from skilled PT to increase their independence and safety with mobility to allow discharge to the venue listed below.       Follow Up Recommendations No PT follow up;Supervision - Intermittent    Equipment Recommendations  None recommended by PT    Recommendations for Other Services       Precautions / Restrictions Precautions Precautions: Fall Restrictions Weight Bearing Restrictions: No      Mobility  Bed Mobility Overal bed mobility: Modified Independent  General bed mobility comments: slow, but able to come to sitting EOB independently    Transfers Overall transfer level: Needs assistance Equipment used: Rolling walker (2 wheeled) Transfers: Sit to/from [facility] Sit to Stand: Min guard Stand pivot transfers: Min guard    General transfer comment: min G with transfers due to slightly impulsive  Ambulation/Gait Ambulation/Gait assistance: Min guard Gait Distance (Feet): 100 Feet (additional 30 ft distance) Assistive device: Rolling walker (2 wheeled) Gait  Pattern/deviations: Step-through pattern;Decreased stride length;Decreased weight shift to left Gait velocity: decreased   General Gait Details: L knee flexed throughout gait cycle and L ball of foot initial contact due to L knee RA pain, dependent on RW for steadying, limited in distance due to L knee pain  Stairs            Wheelchair Mobility    Modified Rankin (Stroke Patients Only)       Balance Overall balance assessment: Needs assistance Sitting-balance support: Feet supported;No upper extremity supported Sitting balance-Leahy Scale: Good Sitting balance - Comments: seated EOB   Standing balance support: During functional activity;Bilateral upper extremity supported Standing balance-Leahy Scale: Poor Standing balance comment: reliant on UE support            Pertinent Vitals/Pain Pain Assessment: Faces Faces Pain Scale: Hurts little more Pain Location: L knee with ambulation Pain Descriptors / Indicators: Grimacing Pain Intervention(s): Limited activity within patient's tolerance;Monitored during session    Home Living Family/patient expects to be discharged to:: Private residence Living Arrangements: Alone Available Help at Discharge: Family;Available PRN/intermittently Type of Home: House Home Access: Ramped entrance     Home Layout: One level Home Equipment: Walker - 2 wheels;Crutches      Prior Function Level of Independence: Independent         Comments: Pt reports independent with ADLs, community ambulator, works FT driving bulldozers/heavy duty equipment, drives, plays horseshoes with friends.     Hand Dominance   Dominant Hand: Right    Extremity/Trunk Assessment   Upper Extremity Assessment Upper Extremity Assessment: Overall WFL for tasks assessed    Lower Extremity Assessment Lower Extremity Assessment: Overall WFL for tasks assessed (  AROM WNL and strength 5/5 throughout, no deficits with heel to shin bilaterally)     Cervical / Trunk Assessment Cervical / Trunk Assessment: Normal  Communication   Communication: No difficulties  Cognition Arousal/Alertness: Awake/alert Behavior During Therapy: WFL for tasks assessed/performed;Impulsive Overall Cognitive Status: Within Functional Limits for tasks assessed  General Comments: pt A&O x4, slightly impulsive, occasionally requires repeating question but possibly HOH?      General Comments General comments (skin integrity, edema, etc.): HR 80s with mobility    Exercises     Assessment/Plan    PT Assessment Patient needs continued PT services  PT Problem List Decreased activity tolerance;Decreased balance;Decreased knowledge of use of DME;Pain       PT Treatment Interventions DME instruction;Gait training;Functional mobility training;Therapeutic activities;Therapeutic exercise;Balance training;Neuromuscular re-education;Patient/family education    PT Goals (Current goals can be found in the Care Plan section)  Acute Rehab PT Goals Patient Stated Goal: return home, possibly stay at daughters for a few days PT Goal Formulation: With patient/family Time For Goal Achievement: 07/06/20 Potential to Achieve Goals: Good    Frequency Min 3X/week   Barriers to discharge        Co-evaluation               AM-PAC PT "6 Clicks" Mobility  Outcome Measure Help needed turning from your back to your side while in a flat bed without using bedrails?: None Help needed moving from lying on your back to sitting on the side of a flat bed without using bedrails?: None Help needed moving to and from a bed to a chair (including a wheelchair)?: None Help needed standing up from a chair using your arms (e.g., wheelchair or bedside chair)?: None Help needed to walk in hospital room?: None Help needed climbing 3-5 steps with a railing? : A Little 6 Click Score: 23    End of Session Equipment Utilized During Treatment: Gait belt Activity Tolerance: Patient  tolerated treatment well;Patient limited by pain Patient left: in chair;with call bell/phone within reach;with family/visitor present Nurse Communication: Mobility status PT Visit Diagnosis: Unsteadiness on feet (R26.81);Other abnormalities of gait and mobility (R26.89);Pain Pain - Right/Left: Left Pain - part of body: Knee    Time: 2774-1287 PT Time Calculation (min) (ACUTE ONLY): 27 min   Charges:   PT Evaluation $PT Eval Low Complexity: 1 Low PT Treatments $Gait Training: 8-22 mins         Tori Graiden Henes PT, DPT 06/22/20, 3:09 PM [phone]

## 2020-06-22 NOTE — Plan of Care (Signed)
  Problem: Acute Rehab PT Goals(only PT should resolve) Goal: Patient Will Transfer Sit To/From Stand Outcome: Progressing Flowsheets (Taken 06/22/2020 1515) Patient will transfer sit to/from stand: with modified independence Goal: Pt Will Transfer Bed To Chair/Chair To Bed Outcome: Progressing Flowsheets (Taken 06/22/2020 1515) Pt will Transfer Bed to Chair/Chair to Bed: with modified independence Goal: Pt Will Ambulate Outcome: Progressing Flowsheets (Taken 06/22/2020 1515) Pt will Ambulate:  > 125 feet  with modified independence  with least restrictive assistive device   Tori Alyaan Budzynski PT, DPT 06/22/20, 3:15 PM [phone]

## 2020-06-22 NOTE — Progress Notes (Signed)
EEG complete - results pending 

## 2020-06-22 NOTE — TOC Initial Note (Signed)
Transition of Care [facility]) - Initial/Assessment Note    Patient Details  Name: Daniel Griffin MRN: [id number] Date of Birth: [birth year]  Transition [facility]) CM/SW Contact:    Salome Arnt, [place] Phone Number: 06/22/2020, 11:01 AM  Clinical Narrative:  Pt admitted due to metabolic encephalopathy. Pt requested that his daughter answer as many questions as possible. LCSW spoke with pt's daughter who reports pt lives alone. Daughter lives down the street. Pt works full time as a Veterinary surgeon. Pt plans to return home when medically stable, but may stay with family for awhile until they are comfortable with him being alone again. Pt's daughter said pt hasn't been to PCP in years. She is a Therapist, sports at [employer] and will work on getting him established with PCP there.   TOC received consult for substance abuse consult. Pt reports he drinks about 12 beers daily and uses marijuana every 2-3 days. When asked if he felt this was a problem for him, he responded, "Nah." He reports he had a 5 year period of sobriety. Pt is planning to cut back, but said he can do this on his own as he did last time. He is not interested in any resources at this time. No other needs reported.                 Expected Discharge Plan: Home/Self Care Barriers to Discharge: Continued Medical Work up   Patient Goals and CMS Choice Patient states their goals for this hospitalization and ongoing recovery are:: return home      Expected Discharge Plan and Services Expected Discharge Plan: Home/Self Care In-house Referral: Clinical Social Work   Post Acute Care Choice: NA Living arrangements for the past 2 months: Single Family Home                                      Prior Living Arrangements/Services Living arrangements for the past 2 months: Single Family Home Lives with:: Self Patient language and need for interpreter reviewed:: Yes Do you feel safe going back to  the place where you live?: Yes            Criminal Activity/Legal Involvement Pertinent to Current Situation/Hospitalization: No - Comment as needed  Activities of Daily Living Home Assistive Devices/Equipment: None ADL Screening (condition at time of admission) Patient's cognitive ability adequate to safely complete daily activities?: Yes Is the patient deaf or have difficulty hearing?: No Does the patient have difficulty seeing, even when wearing glasses/contacts?: No Does the patient have difficulty concentrating, remembering, or making decisions?: No Patient able to express need for assistance with ADLs?: No Does the patient have difficulty dressing or bathing?: No Independently performs ADLs?: Yes (appropriate for developmental age) Does the patient have difficulty walking or climbing stairs?: No Weakness of Legs: None Weakness of Arms/Hands: None  Permission Sought/Granted                  Emotional Assessment     Affect (typically observed): Appropriate Orientation: : Oriented to Self, Oriented to Place, Oriented to  Time, Oriented to Situation Alcohol / Substance Use: Alcohol Use, Illicit Drugs Psych Involvement: No (comment)  Admission diagnosis:  Alcoholism ([place]) [F10.20] Altered mental status, unspecified altered mental status type [R41.82] AMS (altered mental status) [R41.82] Patient Active Problem List   Diagnosis Date Noted  . AMS (altered mental status) 06/21/2020  PCP:  No primary care provider on file. Pharmacy:   [id number] - [place], [place] [address] Phone: [phone] Fax: [phone]     Social Determinants of Health (SDOH) Interventions    Readmission Risk Interventions No flowsheet data found.

## 2020-06-22 NOTE — Progress Notes (Signed)
PROGRESS NOTE    [name]  UKG:[id number] DOB: 02-03-1961 DOA: 06/21/2020 PCP: No primary care provider on file.   Brief Narrative: Daniel Griffin is a 59 y.o. male with medical history significant for rheumatoid arthritis.  Patient was brought to the ED with reports of confusion.  Per triage notes, patient was found in the yard, -not speaking correctly and drooling from the left side of his mouth, reports of garbled speech peach and left-sided weakness. Patient presented secondary to confusion episode of unknown etiology but with concern for acute neurologic event. Neurology consulted.   Assessment & Plan:   Principal Problem:   AMS (altered mental status) Active Problems:   Rheumatoid arteritis (HCC)   Metabolic encephalopathy Unsure of etiology but transient neurologic event is high in the differential. Patient is back to baseline. MRI brain unremarkable for acute process. EEG is pending. Neurology recommendations pending today. Hemoglobin A1C of 5% and LDL of 54. -Neurology recommendations -EEG  Rheumatoid arthritis Patient is on Orencia. Stable.  Alcohol abuse -Continue CIWA, thiamine, folate, MVI   DVT prophylaxis: Lovenox Code Status:   Code Status: Full Code Family Communication: Daughter at bedside Disposition Plan: Discharge likely in 24 hours pending neurology recommendations and EEG   Consultants:   Neurology  Procedures:   None  Antimicrobials:  None    Subjective: No issues this morning.  Objective: Vitals:   06/22/20 0525 06/22/20 0925 06/22/20 1125 06/22/20 1349  BP: (!) 180/72 (!) 146/80 (!) 158/80 136/79  Pulse: 64 61 61 63  Resp: 20 20 20 20   Temp: 99 F (37.2 C) 98.4 F (36.9 C) 99.2 F (37.3 C) 99 F (37.2 C)  TempSrc:  Oral Oral Oral  SpO2: 98% 98% 96% 96%  Weight:      Height:       No intake or output data in the 24 hours ending 06/22/20 1557 Filed Weights   06/21/20 2233 06/22/20 0437  Weight: 102.1 kg 96.6  kg    Examination:  General exam: Appears calm and comfortable Respiratory system: Clear to auscultation. Respiratory effort normal. Cardiovascular system: S1 & S2 heard, RRR. No murmurs, rubs, gallops or clicks. Gastrointestinal system: Abdomen is nondistended, soft and nontender. No organomegaly or masses felt. Normal bowel sounds heard. Central nervous system: Alert and oriented. No focal neurological deficits. Musculoskeletal: No edema. No calf tenderness Skin: No cyanosis. No rashes Psychiatry: Judgement and insight appear normal. Mood & affect appropriate.     Data Reviewed: I have personally reviewed following labs and imaging studies  CBC Lab Results  Component Value Date   WBC 6.2 06/21/2020   RBC 4.00 (L) 06/21/2020   HGB 13.8 06/21/2020   HCT 40.5 06/21/2020   MCV 101.3 (H) 06/21/2020   MCH 34.5 (H) 06/21/2020   PLT 181 06/21/2020   MCHC 34.1 06/21/2020   RDW 12.2 06/21/2020   LYMPHSABS 0.5 (L) 06/21/2020   MONOABS 0.6 06/21/2020   EOSABS 0.0 06/21/2020   BASOSABS 0.0 76/28/3151     Last metabolic panel Lab Results  Component Value Date   NA 133 (L) 06/22/2020   K 3.9 06/22/2020   CL 103 06/22/2020   CO2 23 06/22/2020   BUN 8 06/22/2020   CREATININE 0.79 06/22/2020   GLUCOSE 112 (H) 06/22/2020   GFRNONAA >60 06/22/2020   CALCIUM 8.3 (L) 06/22/2020   PHOS 1.9 (L) 06/21/2020   PROT 7.1 06/21/2020   ALBUMIN 3.8 06/21/2020   BILITOT 1.3 (H) 06/21/2020   ALKPHOS 48  06/21/2020   AST 30 06/21/2020   ALT 32 06/21/2020   ANIONGAP 7 06/22/2020    CBG (last 3)  No results for input(s): GLUCAP in the last 72 hours.   GFR: Estimated Creatinine Clearance: 110.1 mL/min (by C-G formula based on SCr of 0.79 mg/dL).  Coagulation Profile: Recent Labs  Lab 06/21/20 1950  INR 1.0    Recent Results (from the past 240 hour(s))  Respiratory Panel by RT PCR (Flu A&B, Covid) - Nasopharyngeal Swab     Status: None   Collection Time: 06/21/20 10:40 PM    Specimen: Nasopharyngeal Swab  Result Value Ref Range Status   SARS Coronavirus 2 by RT PCR NEGATIVE NEGATIVE Final    Comment: (NOTE) SARS-CoV-2 target nucleic acids are NOT DETECTED.  The SARS-CoV-2 RNA is generally detectable in upper respiratoy specimens during the acute phase of infection. The lowest concentration of SARS-CoV-2 viral copies this assay can detect is 131 copies/mL. A negative result does not preclude SARS-Cov-2 infection and should not be used as the sole basis for treatment or other patient management decisions. A negative result may occur with  improper specimen collection/handling, submission of specimen other than nasopharyngeal swab, presence of viral mutation(s) within the areas targeted by this assay, and inadequate number of viral copies (<131 copies/mL). A negative result must be combined with clinical observations, patient history, and epidemiological information. The expected result is Negative.  Fact Sheet for Patients:  PinkCheek.be  Fact Sheet for Healthcare Providers:  GravelBags.it  This test is no t yet approved or cleared by the [place] FDA and  has been authorized for detection and/or diagnosis of SARS-CoV-2 by FDA under an Emergency Use Authorization (EUA). This EUA will remain  in effect (meaning this test can be used) for the duration of the COVID-19 declaration under Section 564(b)(1) of the Act, 21 U.S.C. section 360bbb-3(b)(1), unless the authorization is terminated or revoked sooner.     Influenza A by PCR NEGATIVE NEGATIVE Final   Influenza B by PCR NEGATIVE NEGATIVE Final    Comment: (NOTE) The Xpert Xpress SARS-CoV-2/FLU/RSV assay is intended as an aid in  the diagnosis of influenza from Nasopharyngeal swab specimens and  should not be used as a sole basis for treatment. Nasal washings and  aspirates are unacceptable for Xpert Xpress SARS-CoV-2/FLU/RSV   testing.  Fact Sheet for Patients: PinkCheek.be  Fact Sheet for Healthcare Providers: GravelBags.it  This test is not yet approved or cleared by the [place] FDA and  has been authorized for detection and/or diagnosis of SARS-CoV-2 by  FDA under an Emergency Use Authorization (EUA). This EUA will remain  in effect (meaning this test can be used) for the duration of the  Covid-19 declaration under Section 564(b)(1) of the Act, 21  U.S.C. section 360bbb-3(b)(1), unless the authorization is  terminated or revoked. Performed at [facility], [address], [place], [place] [address]         Radiology Studies: CT Head Wo Contrast  Result Date: 06/21/2020 CLINICAL DATA:  Confusion, left-sided weakness, delirium EXAM: CT HEAD WITHOUT CONTRAST TECHNIQUE: Contiguous axial images were obtained from the base of the skull through the vertex without intravenous contrast. COMPARISON:  None. FINDINGS: Brain: No acute infarct or hemorrhage. Lateral ventricles and midline structures are unremarkable. No acute extra-axial fluid collections. No mass effect. Vascular: No hyperdense vessel or unexpected calcification. Skull: Normal. Negative for fracture or focal lesion. Sinuses/Orbits: Mild mucosal thickening within the frontal, ethmoid, and sphenoid sinuses. Other: None. IMPRESSION:  1. No acute intracranial process. Electronically Signed   By: Randa Ngo M.D.   On: 06/21/2020 19:23   MR BRAIN W WO CONTRAST  Result Date: 06/22/2020 CLINICAL DATA:  Mental status change, unknown cause. Left-sided weakness. EXAM: MRI HEAD WITHOUT AND WITH CONTRAST TECHNIQUE: Multiplanar, multiecho pulse sequences of the brain and surrounding structures were obtained without and with intravenous contrast. CONTRAST:  41mL GADAVIST GADOBUTROL 1 MMOL/ML IV SOLN COMPARISON:  06/21/2020 head CT. FINDINGS: Brain: No diffusion-weighted signal abnormality. No  intracranial hemorrhage. No midline shift, ventriculomegaly or extra-axial fluid collection. No mass lesion. No abnormal enhancement. Mild cerebral atrophy with ex vacuo dilatation. Mild chronic microvascular ischemic changes. Vascular: Proximally preserved major intracranial flow voids. Skull and upper cervical spine: Normal marrow signal. Sinuses/Orbits: Normal orbits. Minimal pansinus mucosal thickening. Trace right mastoid effusion. Other: None. IMPRESSION: No acute intracranial process. Mild cerebral atrophy and chronic microvascular ischemic changes. Electronically Signed   By: Primitivo Gauze M.D.   On: 06/22/2020 09:01        Scheduled Meds: . Abatacept  125 mg Subcutaneous Weekly  . enoxaparin (LOVENOX) injection  40 mg Subcutaneous Q24H  . folic acid  1 mg Oral Daily  . multivitamin with minerals  1 tablet Oral Daily  . thiamine  100 mg Oral Daily   Or  . thiamine  100 mg Intravenous Daily   Continuous Infusions:   LOS: 0 days     Cordelia Poche, MD [facility] 06/22/2020, 3:57 PM  If 7PM-7AM, please contact night-coverage [email or web address]

## 2020-06-23 DIAGNOSIS — R404 Transient alteration of awareness: Secondary | ICD-10-CM | POA: Diagnosis not present

## 2020-06-23 DIAGNOSIS — G9341 Metabolic encephalopathy: Secondary | ICD-10-CM | POA: Diagnosis not present

## 2020-06-23 DIAGNOSIS — M069 Rheumatoid arthritis, unspecified: Secondary | ICD-10-CM | POA: Diagnosis not present

## 2020-06-23 DIAGNOSIS — R410 Disorientation, unspecified: Secondary | ICD-10-CM | POA: Diagnosis not present

## 2020-06-23 DIAGNOSIS — F1010 Alcohol abuse, uncomplicated: Secondary | ICD-10-CM | POA: Diagnosis not present

## 2020-06-23 DIAGNOSIS — F1020 Alcohol dependence, uncomplicated: Secondary | ICD-10-CM | POA: Diagnosis not present

## 2020-06-23 DIAGNOSIS — Z20822 Contact with and (suspected) exposure to covid-19: Secondary | ICD-10-CM | POA: Diagnosis not present

## 2020-06-23 DIAGNOSIS — R4182 Altered mental status, unspecified: Secondary | ICD-10-CM | POA: Diagnosis not present

## 2020-06-23 DIAGNOSIS — M0520 Rheumatoid vasculitis with rheumatoid arthritis of unspecified site: Secondary | ICD-10-CM | POA: Diagnosis not present

## 2020-06-23 DIAGNOSIS — R569 Unspecified convulsions: Secondary | ICD-10-CM | POA: Diagnosis not present

## 2020-06-23 LAB — POCT I-STAT, CHEM 8
BUN: 7 mg/dL (ref 6–20)
Calcium, Ion: 1.07 mmol/L — ABNORMAL LOW (ref 1.15–1.40)
Chloride: 102 mmol/L (ref 98–111)
Creatinine, Ser: 0.8 mg/dL (ref 0.61–1.24)
Glucose, Bld: 120 mg/dL — ABNORMAL HIGH (ref 70–99)
HCT: 39 % (ref 39.0–52.0)
Hemoglobin: 13.3 g/dL (ref 13.0–17.0)
Potassium: 3.5 mmol/L (ref 3.5–5.1)
Sodium: 140 mmol/L (ref 135–145)
TCO2: 27 mmol/L (ref 22–32)

## 2020-06-23 MED ORDER — THIAMINE HCL 100 MG PO TABS
100.0000 mg | ORAL_TABLET | Freq: Every day | ORAL | Status: DC
Start: 2020-06-24 — End: 2020-08-27

## 2020-06-23 MED ORDER — FOLIC ACID 1 MG PO TABS
1.0000 mg | ORAL_TABLET | Freq: Every day | ORAL | 1 refills | Status: DC
Start: 2020-06-24 — End: 2020-08-27

## 2020-06-23 MED ORDER — ADULT MULTIVITAMIN W/MINERALS CH
1.0000 | ORAL_TABLET | Freq: Every day | ORAL | Status: DC
Start: 2020-06-24 — End: 2020-08-27

## 2020-06-23 NOTE — Consult Note (Addendum)
[place] [name], MD     [email or web address]          Daniel Griffin is an 59 y.o. male.   ASSESSMENT/PLAN: 1. ALTERED MENTAL STATUS AND GAIT IMPAIRMENT OCCURRING ACUTELY. The most likely scenario seems to be alcohol withdrawal seizure that was not witnessed. The patient likely was a witness post ictally and was having postictal confusion, encephalopathy and staggering. No seizure medications are recommended at this time. Unofficial EEG is normal.  The patient should avoid driving as much as possible for the next month to assess for any recurrence. Alcohol cessation is also discussed. 2. Baseline gait impairment due to significant rheumatoid arthritis     This is a 59 year old white male who was noted to have acute onset of staggering witnessed by his family. He was walking out to the yard and the was noted to be staggering walking towards highway with cars driving fast by. On reaching the patient, they noticed he was confused disoriented with nonsensical speech. The patient has no recollection of this event. It appears the patient has returned to baseline. This happened about 630 Monday evening. The patient does consume large portions of alcohol approximately a 12 pack daily. He last consumed alcoholic beverage about 24 hours before this current event. He appears to be at baseline at this time and does not have any complaints other than baseline joint pain and achiness which is a more than usual because he missed his injections for rheumatoid arthritis due to being hospitalized. The review systems otherwise negative.    GENERAL: The patient is doing well at this time.  HEENT: Neck is supple no trauma noted.  ABDOMEN: Soft  EXTREMITIES: No edema; there is significant arthritic changes noted of the hands and knees.   BACK: Normal alignment.  SKIN: Normal by inspection.    MENTAL STATUS: Alert and oriented. Speech, language and cognition are generally intact.  Judgment and insight normal.   CRANIAL NERVES: Pupils are equal, round and reactive to light and accommodation; extraocular movements are full, there is no significant nystagmus; upper and lower facial muscles are normal in strength and symmetric, there is no flattening of the nasolabial folds; tongue is midline; uvula is midline; shoulder elevation is normal.  MOTOR: Normal tone, bulk and strength; no pronator drift.  COORDINATION: Left finger to nose is normal, right finger to nose is normal, No rest tremor; no intention tremor; no postural tremor; no bradykinesia.  REFLEXES: Deep tendon reflexes are symmetrical and normal.   SENSATION: Normal to light touch and temperature.  GAIT: Gait is wide-based and antalgic.  The EEG is reviewed in person and appears normal.    Blood pressure (!) 142/78, pulse (!) 55, temperature 98.9 F (37.2 C), temperature source Oral, resp. rate 18, height 5\' 7"  (1.702 m), weight 96.6 kg, SpO2 98 %.  Past Medical History:  Diagnosis Date  . Arthritis     History reviewed. No pertinent surgical history.  History reviewed. No pertinent family history.  Social History:  reports that he has never smoked. His smokeless tobacco use includes snuff. He reports current alcohol use of about 85.0 standard drinks of alcohol per week. He reports current drug use. Frequency: 2.00 times per week. Drug: Marijuana.  Allergies: Not on File  Medications: Prior to Admission medications   Medication Sig Start Date End Date Taking? Authorizing Provider  ORENCIA CLICKJECT 956 MG/ML SOAJ Inject 125 mg into the skin once a week.  06/17/20  Yes [provider]  Scheduled Meds: . Abatacept  125 mg Subcutaneous Weekly  . enoxaparin (LOVENOX) injection  40 mg Subcutaneous Q24H  . folic acid  1 mg Oral Daily  . multivitamin with minerals  1 tablet Oral Daily  . thiamine  100 mg Oral Daily   Or  . thiamine  100 mg Intravenous Daily   Continuous  Infusions: PRN Meds:.acetaminophen **OR** acetaminophen, LORazepam **OR** LORazepam, ondansetron **OR** ondansetron (ZOFRAN) IV, polyethylene glycol     Results for orders placed or performed during the hospital encounter of 06/21/20 (from the past 48 hour(s))  Ethanol     Status: None   Collection Time: 06/21/20  7:50 PM  Result Value Ref Range   Alcohol, Ethyl (B) <10 <10 mg/dL    Comment: (NOTE) Lowest detectable limit for serum alcohol is 10 mg/dL.  For medical purposes only. Performed at [facility], [address] [place] [address], [place], [place] [address]   Protime-INR     Status: None   Collection Time: 06/21/20  7:50 PM  Result Value Ref Range   Prothrombin Time 12.9 11.4 - 15.2 seconds   INR 1.0 0.8 - 1.2    Comment: (NOTE) INR goal varies based on device and disease states. Performed at [facility], [address] [place], [place] [address]   APTT     Status: Abnormal   Collection Time: 06/21/20  7:50 PM  Result Value Ref Range   aPTT 23 (L) 24 - 36 seconds    Comment: Performed at [facility], [address] [place], [place] [address]  CBC     Status: Abnormal   Collection Time: 06/21/20  7:50 PM  Result Value Ref Range   WBC 6.2 4.0 - 10.5 K/uL   RBC 4.00 (L) 4.22 - 5.81 MIL/uL   Hemoglobin 13.8 13.0 - 17.0 g/dL   HCT 40.5 39 - 52 %   MCV 101.3 (H) 80.0 - 100.0 fL   MCH 34.5 (H) 26.0 - 34.0 pg   MCHC 34.1 30.0 - 36.0 g/dL   RDW 12.2 11.5 - 15.5 %   Platelets 181 150 - 400 K/uL   nRBC 0.0 0.0 - 0.2 %    Comment: Performed at [facility], [address], [place], [place] [address]  Differential     Status: Abnormal   Collection Time: 06/21/20  7:50 PM  Result Value Ref Range   Neutrophils Relative % 82 %   Neutro Abs 5.1 1.7 - 7.7 K/uL   Lymphocytes Relative 9 %   Lymphs Abs 0.5 (L) 0.7 - 4.0 K/uL   Monocytes Relative 9 %   Monocytes Absolute 0.6 0.1 - 1.0 K/uL   Eosinophils Relative 0 %   Eosinophils Absolute 0.0 0.0 - 0.5 K/uL   Basophils Relative 0 %    Basophils Absolute 0.0 0.0 - 0.1 K/uL   Immature Granulocytes 0 %   Abs Immature Granulocytes 0.02 0.00 - 0.07 K/uL    Comment: Performed at [facility], [address], [place], [place] [address]  Comprehensive metabolic panel     Status: Abnormal   Collection Time: 06/21/20  7:50 PM  Result Value Ref Range   Sodium 135 135 - 145 mmol/L   Potassium 3.4 (L) 3.5 - 5.1 mmol/L   Chloride 100 98 - 111 mmol/L   CO2 26 22 - 32 mmol/L   Glucose, Bld 147 (H) 70 - 99 mg/dL    Comment: Glucose reference range applies only to samples taken after fasting for at least 8 hours.  BUN 9 6 - 20 mg/dL   Creatinine, Ser 0.89 0.61 - 1.24 mg/dL   Calcium 8.5 (L) 8.9 - 10.3 mg/dL   Total Protein 7.1 6.5 - 8.1 g/dL   Albumin 3.8 3.5 - 5.0 g/dL   AST 30 15 - 41 U/L   ALT 32 0 - 44 U/L   Alkaline Phosphatase 48 38 - 126 U/L   Total Bilirubin 1.3 (H) 0.3 - 1.2 mg/dL   GFR, Estimated >60 >60 mL/min    Comment: (NOTE) Calculated using the CKD-EPI Creatinine Equation (2021)    Anion gap 9 5 - 15    Comment: Performed at [facility], [address] [place], [place] [address]  Magnesium     Status: None   Collection Time: 06/21/20  7:50 PM  Result Value Ref Range   Magnesium 2.1 1.7 - 2.4 mg/dL    Comment: Performed at [facility], [address], [place], [place] [address]  Phosphorus     Status: Abnormal   Collection Time: 06/21/20  7:50 PM  Result Value Ref Range   Phosphorus 1.9 (L) 2.5 - 4.6 mg/dL    Comment: Performed at [facility], [address] [place], [place] [address]  Sedimentation rate     Status: Abnormal   Collection Time: 06/21/20  7:50 PM  Result Value Ref Range   Sed Rate 20 (H) 0 - 16 mm/hr    Comment: Performed at [facility], [address], [place], [place] [address]  I-STAT, [place] 8     Status: Abnormal   Collection Time: 06/21/20  8:28 PM  Result Value Ref Range   Sodium 140 135 - 145 mmol/L   Potassium 3.5 3.5 - 5.1 mmol/L   Chloride 102 98 - 111 mmol/L   BUN 7 6 - 20  mg/dL   Creatinine, Ser 0.80 0.61 - 1.24 mg/dL   Glucose, Bld 120 (H) 70 - 99 mg/dL    Comment: Glucose reference range applies only to samples taken after fasting for at least 8 hours.   Calcium, Ion 1.07 (L) 1.15 - 1.40 mmol/L   TCO2 27 22 - 32 mmol/L   Hemoglobin 13.3 13.0 - 17.0 g/dL   HCT 39.0 39 - 52 %  Urine rapid drug screen (hosp performed)     Status: Abnormal   Collection Time: 06/21/20  8:53 PM  Result Value Ref Range   Opiates NONE DETECTED NONE DETECTED   Cocaine NONE DETECTED NONE DETECTED   Benzodiazepines NONE DETECTED NONE DETECTED   Amphetamines NONE DETECTED NONE DETECTED   Tetrahydrocannabinol POSITIVE (A) NONE DETECTED   Barbiturates NONE DETECTED NONE DETECTED    Comment: (NOTE) DRUG SCREEN FOR MEDICAL PURPOSES ONLY.  IF CONFIRMATION IS NEEDED FOR ANY PURPOSE, NOTIFY LAB WITHIN 5 DAYS.  LOWEST DETECTABLE LIMITS FOR URINE DRUG SCREEN Drug Class                     Cutoff (ng/mL) Amphetamine and metabolites    1000 Barbiturate and metabolites    200 Benzodiazepine                 700 Tricyclics and metabolites     300 Opiates and metabolites        300 Cocaine and metabolites        300 THC                            50 Performed at  [address] [place], [place] [address]   Urinalysis, Routine w reflex microscopic Urine, Clean Catch     Status: Abnormal   Collection Time: 06/21/20  8:53 PM  Result Value Ref Range   Color, Urine YELLOW YELLOW   APPearance CLEAR CLEAR   Specific Gravity, Urine 1.017 1.005 - 1.030   pH 8.0 5.0 - 8.0   Glucose, UA NEGATIVE NEGATIVE mg/dL   Hgb urine dipstick NEGATIVE NEGATIVE   Bilirubin Urine NEGATIVE NEGATIVE   Ketones, ur NEGATIVE NEGATIVE mg/dL   Protein, ur 30 (A) NEGATIVE mg/dL   Nitrite NEGATIVE NEGATIVE   Leukocytes,Ua NEGATIVE NEGATIVE   RBC / HPF 0-5 0 - 5 RBC/hpf   WBC, UA 0-5 0 - 5 WBC/hpf   Bacteria, UA NONE SEEN NONE SEEN   Squamous Epithelial / LPF 0-5 0 - 5    Comment: Performed at  [facility], [address] [place], [place] [address]  Ethanol     Status: None   Collection Time: 06/21/20  9:38 PM  Result Value Ref Range   Alcohol, Ethyl (B) <10 <10 mg/dL    Comment: (NOTE) Lowest detectable limit for serum alcohol is 10 mg/dL.  For medical purposes only. Performed at [facility] [address], [place],  [address]   Respiratory Panel by RT PCR (Flu A&B, Covid) - Nasopharyngeal Swab     Status: None   Collection Time: 06/21/20 10:40 PM   Specimen: Nasopharyngeal Swab  Result Value Ref Range   SARS Coronavirus 2 by RT PCR NEGATIVE NEGATIVE    Comment: (NOTE) SARS-CoV-2 target nucleic acids are NOT DETECTED.  The SARS-CoV-2 RNA is generally detectable in upper respiratoy specimens during the acute phase of infection. The lowest concentration of SARS-CoV-2 viral copies this assay can detect is 131 copies/mL. A negative result does not preclude SARS-Cov-2 infection and should not be used as the sole basis for treatment or other patient management decisions. A negative result may occur with  improper specimen collection/handling, submission of specimen other than nasopharyngeal swab, presence of viral mutation(s) within the areas targeted by this assay, and inadequate number of viral copies (<131 copies/mL). A negative result must be combined with clinical observations, patient history, and epidemiological information. The expected result is Negative.  Fact Sheet for Patients:  PinkCheek.be  Fact Sheet for Healthcare Providers:  GravelBags.it  This test is no t yet approved or cleared by the [place] FDA and  has been authorized for detection and/or diagnosis of SARS-CoV-2 by FDA under an Emergency Use Authorization (EUA). This EUA will remain  in effect (meaning this test can be used) for the duration of the COVID-19 declaration under Section 564(b)(1) of the Act, 21 U.S.C. section  360bbb-3(b)(1), unless the authorization is terminated or revoked sooner.     Influenza A by PCR NEGATIVE NEGATIVE   Influenza B by PCR NEGATIVE NEGATIVE    Comment: (NOTE) The Xpert Xpress SARS-CoV-2/FLU/RSV assay is intended as an aid in  the diagnosis of influenza from Nasopharyngeal swab specimens and  should not be used as a sole basis for treatment. Nasal washings and  aspirates are unacceptable for Xpert Xpress SARS-CoV-2/FLU/RSV  testing.  Fact Sheet for Patients: PinkCheek.be  Fact Sheet for Healthcare Providers: GravelBags.it  This test is not yet approved or cleared by the [place] FDA and  has been authorized for detection and/or diagnosis of SARS-CoV-2 by  FDA under an Emergency Use Authorization (EUA). This EUA will remain  in effect (  meaning this test can be used) for the duration of the  Covid-19 declaration under Section 564(b)(1) of the Act, 21  U.S.C. section 360bbb-3(b)(1), unless the authorization is  terminated or revoked. Performed at [facility], [address], [place], [place] [address]   Ammonia     Status: Abnormal   Collection Time: 06/21/20 11:59 PM  Result Value Ref Range   Ammonia 44 (H) 9 - 35 umol/L    Comment: Performed at [facility], [address], [place], [place] [address]  Vitamin B12     Status: None   Collection Time: 06/21/20 11:59 PM  Result Value Ref Range   Vitamin B-12 341 180 - 914 pg/mL    Comment: (NOTE) This assay is not validated for testing neonatal or myeloproliferative syndrome specimens for Vitamin B12 levels. Performed at [facility], [address], [place], [place] [address]   Folate, serum, performed at [facility] lab     Status: None   Collection Time: 06/21/20 11:59 PM  Result Value Ref Range   Folate 8.9 >5.9 ng/mL    Comment: Performed at [facility], [address], [place], [place] [address]  HIV Antibody (routine testing w rflx)     Status:  None   Collection Time: 06/21/20 11:59 PM  Result Value Ref Range   HIV Screen 4th Generation wRfx Non Reactive Non Reactive    Comment: Performed at [facility], [place] [address], [place], [place] [address]  Hemoglobin A1c     Status: None   Collection Time: 06/21/20 11:59 PM  Result Value Ref Range   Hgb A1c MFr Bld 5.0 4.8 - 5.6 %    Comment: (NOTE) Pre diabetes:          5.7%-6.4%  Diabetes:              >6.4%  Glycemic control for   <7.0% adults with diabetes    Mean Plasma Glucose 96.8 mg/dL    Comment: Performed at [facility] [address], [place], [place] [address]  Basic metabolic panel     Status: Abnormal   Collection Time: 06/22/20  6:03 AM  Result Value Ref Range   Sodium 133 (L) 135 - 145 mmol/L   Potassium 3.9 3.5 - 5.1 mmol/L   Chloride 103 98 - 111 mmol/L   CO2 23 22 - 32 mmol/L   Glucose, Bld 112 (H) 70 - 99 mg/dL    Comment: Glucose reference range applies only to samples taken after fasting for at least 8 hours.   BUN 8 6 - 20 mg/dL   Creatinine, Ser 0.79 0.61 - 1.24 mg/dL   Calcium 8.3 (L) 8.9 - 10.3 mg/dL   GFR, Estimated >60 >60 mL/min    Comment: (NOTE) Calculated using the CKD-EPI Creatinine Equation (2021)    Anion gap 7 5 - 15    Comment: Performed at [facility], [address] [place], [place] [address]  Lipid panel     Status: None   Collection Time: 06/22/20  6:03 AM  Result Value Ref Range   Cholesterol 139 0 - 200 mg/dL   Triglycerides 49 <150 mg/dL   HDL 75 >40 mg/dL   Total CHOL/HDL Ratio 1.9 RATIO   VLDL 10 0 - 40 mg/dL   LDL Cholesterol 54 0 - 99 mg/dL    Comment:        Total Cholesterol/HDL:CHD Risk Coronary Heart Disease Risk Table  Men   Women  1/2 Average Risk   3.4   3.3  Average Risk       5.0   4.4  2 X Average Risk   9.6   7.1  3 X Average Risk  23.4   11.0        Use the calculated Patient Ratio above and the CHD Risk Table to determine the patient's CHD Risk.        ATP III  CLASSIFICATION (LDL):  <100     mg/dL   Optimal  100-129  mg/dL   Near or Above                    Optimal  130-159  mg/dL   Borderline  160-189  mg/dL   High  >190     mg/dL   Very High Performed at [facility] [place], [place] [address]     Studies/Results:   BRAIN MRI W/WO FINDINGS: Brain: No diffusion-weighted signal abnormality. No intracranial hemorrhage. No midline shift, ventriculomegaly or extra-axial fluid collection. No mass lesion. No abnormal enhancement. Mild cerebral atrophy with ex vacuo dilatation. Mild chronic microvascular ischemic changes.  Vascular: Proximally preserved major intracranial flow voids.  Skull and upper cervical spine: Normal marrow signal.  Sinuses/Orbits: Normal orbits. Minimal pansinus mucosal thickening. Trace right mastoid effusion.  Other: None.  IMPRESSION: No acute intracranial process.  Mild cerebral atrophy and chronic microvascular ischemic changes.  The BRAIN MRI is reviewed. No acute changes on DWI. Mild PVWD; NO bleed. Mild atrophy.     Emogene Muratalla Daniel Griffin, M.D.  Diplomate, Tax adviser of Psychiatry and Neurology ( Neurology). 06/23/2020, 8:05 AM

## 2020-06-23 NOTE — Procedures (Signed)
Patient Name: Daniel Griffin  MRN: [id number]  Epilepsy Attending: Lora Havens  Referring Physician/Provider: Dr. Cordelia Poche Date: 06/22/2020 Duration: 26.11 mins  Patient history: 59 year old male with altered mental status.  EEG to evaluate for seizures.  Level of alertness: Awake, asleep  AEDs during EEG study: None  Technical aspects: This EEG study was done with scalp electrodes positioned according to the 10-20 International system of electrode placement. Electrical activity was acquired at a sampling rate of 500Hz  and reviewed with a high frequency filter of 70Hz  and a low frequency filter of 1Hz . EEG data were recorded continuously and digitally stored.   Description: The posterior dominant rhythm consists of 9 Hz activity of moderate voltage (25-35 uV) seen predominantly in posterior head regions, symmetric and reactive to eye opening and eye closing.  Sleep was characterized by vertex waves, sleep spindles (12 to 14 Hz), maximal frontocentral region.  Physiologic photic driving was not seen during photic stimulation.  Hyperventilation was not performed.     IMPRESSION: This study is within normal limits. No seizures or epileptiform discharges were seen throughout the recording.  [name] Barbra Sarks

## 2020-06-24 ENCOUNTER — Encounter: Payer: Self-pay | Admitting: Physician Assistant

## 2020-06-24 NOTE — Discharge Summary (Signed)
Physician Discharge Summary  Daniel Griffin VOJ:[id number] DOB: 10-23-1960 DOA: 06/21/2020  PCP: Default, Provider, MD  Admit date: 06/21/2020 Discharge date: 06/24/2020  Admitted From: Home. Disposition:  Home.   Recommendations for Outpatient Follow-up:  1. Follow up with PCP in 1-2 weeks 2. Please obtain BMP/CBC in one week Please follow up with  Neurology as recommended,  Do not drive for 30 days as er neurology recommendations.   Discharge Condition:stable.  CODE STATUS:full code.  Diet recommendation: Heart Healthy    Brief/Interim Summary: Daniel Griffin is a 59 y.o. malewith medical history significant forrheumatoid arthritis. Patient was brought to the ED with reports of confusion. Per triage notes, patient was found in the yard, -not speaking correctly and drooling from the left side of his mouth,reports of garbled speech peach and left-sided weakness. Patient presented secondary to confusion episode of unknown etiology but with concern for acute neurologic event. Neurology consulted.  Discharge Diagnoses:  Principal Problem:   AMS (altered mental status) Active Problems:   Rheumatoid arteritis (HCC)  Acute metabolic encephalopathy Transient neurological event versus postictal event probably secondary to alcohol withdrawal.  Neurology consulted recommendations to follow-up as an outpatient.  MRI of the brain ruled out acute stroke.  Hemoglobin A1c is 5, LDL is 54. EEG is negative for acute epileptiform activity. No driving for 30 days as per neurology recommendations.  Patient is currently alert and oriented and is agreeable to alcohol cessation.  But he refuses to take any resources. Discussed in detail with the daughter at bedside and the patient.   Rheumatoid arthritis Resume home medications at this time.   Alcohol abuse No withdrawal symptoms at this time continue with thiamine folate and multivitamin.   Hypokalemia Replaced  Discharge  Instructions  Discharge Instructions    Diet - low sodium heart healthy   Complete by: As directed    Discharge instructions   Complete by: As directed    Please follow up with PCP in one to two week.  No Driving for at least 1 month.     Allergies as of 06/23/2020   No Known Allergies     Medication List    TAKE these medications   folic acid 1 MG tablet Commonly known as: FOLVITE Take 1 tablet (1 mg total) by mouth daily.   multivitamin with minerals Tabs tablet Take 1 tablet by mouth daily.   Orencia ClickJect 993 MG/ML Soaj Generic drug: Abatacept Inject 125 mg into the skin once a week.   thiamine 100 MG tablet Take 1 tablet (100 mg total) by mouth daily.       No Known Allergies  Consultations:  Neurology.    Procedures/Studies: CT Head Wo Contrast  Result Date: 06/21/2020 CLINICAL DATA:  Confusion, left-sided weakness, delirium EXAM: CT HEAD WITHOUT CONTRAST TECHNIQUE: Contiguous axial images were obtained from the base of the skull through the vertex without intravenous contrast. COMPARISON:  None. FINDINGS: Brain: No acute infarct or hemorrhage. Lateral ventricles and midline structures are unremarkable. No acute extra-axial fluid collections. No mass effect. Vascular: No hyperdense vessel or unexpected calcification. Skull: Normal. Negative for fracture or focal lesion. Sinuses/Orbits: Mild mucosal thickening within the frontal, ethmoid, and sphenoid sinuses. Other: None. IMPRESSION: 1. No acute intracranial process. Electronically Signed   By: Randa Ngo M.D.   On: 06/21/2020 19:23   MR BRAIN W WO CONTRAST  Result Date: 06/22/2020 CLINICAL DATA:  Mental status change, unknown cause. Left-sided weakness. EXAM: MRI HEAD WITHOUT AND WITH CONTRAST TECHNIQUE:  Multiplanar, multiecho pulse sequences of the brain and surrounding structures were obtained without and with intravenous contrast. CONTRAST:  15mL GADAVIST GADOBUTROL 1 MMOL/ML IV SOLN COMPARISON:   06/21/2020 head CT. FINDINGS: Brain: No diffusion-weighted signal abnormality. No intracranial hemorrhage. No midline shift, ventriculomegaly or extra-axial fluid collection. No mass lesion. No abnormal enhancement. Mild cerebral atrophy with ex vacuo dilatation. Mild chronic microvascular ischemic changes. Vascular: Proximally preserved major intracranial flow voids. Skull and upper cervical spine: Normal marrow signal. Sinuses/Orbits: Normal orbits. Minimal pansinus mucosal thickening. Trace right mastoid effusion. Other: None. IMPRESSION: No acute intracranial process. Mild cerebral atrophy and chronic microvascular ischemic changes. Electronically Signed   By: Primitivo Gauze M.D.   On: 06/22/2020 09:01   EEG adult  Result Date: 06/23/2020 Lora Havens, MD     06/23/2020 10:05 AM Patient Name: Daniel Griffin MRN: [id number] Epilepsy Attending: Lora Havens Referring Physician/Provider: Dr. Cordelia Poche Date: 06/22/2020 Duration: 26.11 mins Patient history: 59 year old male with altered mental status.  EEG to evaluate for seizures. Level of alertness: Awake, asleep AEDs during EEG study: None Technical aspects: This EEG study was done with scalp electrodes positioned according to the 10-20 International system of electrode placement. Electrical activity was acquired at a sampling rate of 500Hz  and reviewed with a high frequency filter of 70Hz  and a low frequency filter of 1Hz . EEG data were recorded continuously and digitally stored. Description: The posterior dominant rhythm consists of 9 Hz activity of moderate voltage (25-35 uV) seen predominantly in posterior head regions, symmetric and reactive to eye opening and eye closing.  Sleep was characterized by vertex waves, sleep spindles (12 to 14 Hz), maximal frontocentral region.  Physiologic photic driving was not seen during photic stimulation.  Hyperventilation was not performed.   IMPRESSION: This study is within normal limits. No seizures  or epileptiform discharges were seen throughout the recording. Priyanka Barbra Sarks       Subjective:  No new complaints. Wants to go home.  Discharge Exam: Vitals:   06/23/20 0154 06/23/20 0403  BP: (!) 155/86 (!) 142/78  Pulse: 60 (!) 55  Resp: 18 18  Temp: 99.3 F (37.4 C) 98.9 F (37.2 C)  SpO2: 96% 98%   Vitals:   06/22/20 1700 06/22/20 2114 06/23/20 0154 06/23/20 0403  BP: (!) 150/60 (!) 155/70 (!) 155/86 (!) 142/78  Pulse: 60 (!) 59 60 (!) 55  Resp: 20 20 18 18   Temp: 98.7 F (37.1 C) 99.1 F (37.3 C) 99.3 F (37.4 C) 98.9 F (37.2 C)  TempSrc:    Oral  SpO2: 98% 96% 96% 98%  Weight:      Height:        General: Pt is alert, awake, not in acute distress Cardiovascular: RRR, S1/S2 +, no rubs, no gallops Respiratory: CTA bilaterally, no wheezing, no rhonchi Abdominal: Soft, NT, ND, bowel sounds + Extremities: no edema, no cyanosis    The results of significant diagnostics from this hospitalization (including imaging, microbiology, ancillary and laboratory) are listed below for reference.     Microbiology: Recent Results (from the past 240 hour(s))  Respiratory Panel by RT PCR (Flu A&B, Covid) - Nasopharyngeal Swab     Status: None   Collection Time: 06/21/20 10:40 PM   Specimen: Nasopharyngeal Swab  Result Value Ref Range Status   SARS Coronavirus 2 by RT PCR NEGATIVE NEGATIVE Final    Comment: (NOTE) SARS-CoV-2 target nucleic acids are NOT DETECTED.  The SARS-CoV-2 RNA is generally detectable in upper respiratoy  specimens during the acute phase of infection. The lowest concentration of SARS-CoV-2 viral copies this assay can detect is 131 copies/mL. A negative result does not preclude SARS-Cov-2 infection and should not be used as the sole basis for treatment or other patient management decisions. A negative result may occur with  improper specimen collection/handling, submission of specimen other than nasopharyngeal swab, presence of viral mutation(s)  within the areas targeted by this assay, and inadequate number of viral copies (<131 copies/mL). A negative result must be combined with clinical observations, patient history, and epidemiological information. The expected result is Negative.  Fact Sheet for Patients:  PinkCheek.be  Fact Sheet for Healthcare Providers:  GravelBags.it  This test is no t yet approved or cleared by the [place] FDA and  has been authorized for detection and/or diagnosis of SARS-CoV-2 by FDA under an Emergency Use Authorization (EUA). This EUA will remain  in effect (meaning this test can be used) for the duration of the COVID-19 declaration under Section 564(b)(1) of the Act, 21 U.S.C. section 360bbb-3(b)(1), unless the authorization is terminated or revoked sooner.     Influenza A by PCR NEGATIVE NEGATIVE Final   Influenza B by PCR NEGATIVE NEGATIVE Final    Comment: (NOTE) The Xpert Xpress SARS-CoV-2/FLU/RSV assay is intended as an aid in  the diagnosis of influenza from Nasopharyngeal swab specimens and  should not be used as a sole basis for treatment. Nasal washings and  aspirates are unacceptable for Xpert Xpress SARS-CoV-2/FLU/RSV  testing.  Fact Sheet for Patients: PinkCheek.be  Fact Sheet for Healthcare Providers: GravelBags.it  This test is not yet approved or cleared by the [place] FDA and  has been authorized for detection and/or diagnosis of SARS-CoV-2 by  FDA under an Emergency Use Authorization (EUA). This EUA will remain  in effect (meaning this test can be used) for the duration of the  Covid-19 declaration under Section 564(b)(1) of the Act, 21  U.S.C. section 360bbb-3(b)(1), unless the authorization is  terminated or revoked. Performed at [facility], [address] [place], [place] [address]      Labs: BNP (last 3 results) No results for  input(s): BNP in the last 8760 hours. Basic Metabolic Panel: Recent Labs  Lab 06/21/20 1950 06/21/20 2028 06/22/20 0603  NA 135 140 133*  K 3.4* 3.5 3.9  CL 100 102 103  CO2 26  --  23  GLUCOSE 147* 120* 112*  BUN 9 7 8   CREATININE 0.89 0.80 0.79  CALCIUM 8.5*  --  8.3*  MG 2.1  --   --   PHOS 1.9*  --   --    Liver Function Tests: Recent Labs  Lab 06/21/20 1950  AST 30  ALT 32  ALKPHOS 48  BILITOT 1.3*  PROT 7.1  ALBUMIN 3.8   No results for input(s): LIPASE, AMYLASE in the last 168 hours. Recent Labs  Lab 06/21/20 2359  AMMONIA 44*   CBC: Recent Labs  Lab 06/21/20 1950 06/21/20 2028  WBC 6.2  --   NEUTROABS 5.1  --   HGB 13.8 13.3  HCT 40.5 39.0  MCV 101.3*  --   PLT 181  --    Cardiac Enzymes: No results for input(s): CKTOTAL, CKMB, CKMBINDEX, TROPONINI in the last 168 hours. BNP: Invalid input(s): POCBNP CBG: No results for input(s): GLUCAP in the last 168 hours. D-Dimer No results for input(s): DDIMER in the last 72 hours. Hgb A1c Recent Labs    06/21/20 2359  HGBA1C  5.0   Lipid Profile Recent Labs    06/22/20 0603  CHOL 139  HDL 75  LDLCALC 54  TRIG 49  CHOLHDL 1.9   Thyroid function studies No results for input(s): TSH, T4TOTAL, T3FREE, THYROIDAB in the last 72 hours.  Invalid input(s): FREET3 Anemia work up Recent Labs    06/21/20 2359  VITAMINB12 341  FOLATE 8.9   Urinalysis    Component Value Date/Time   COLORURINE YELLOW 06/21/2020 2053   APPEARANCEUR CLEAR 06/21/2020 2053   LABSPEC 1.017 06/21/2020 2053   PHURINE 8.0 06/21/2020 2053   GLUCOSEU NEGATIVE 06/21/2020 2053   HGBUR NEGATIVE 06/21/2020 2053   [place] NEGATIVE 06/21/2020 2053   KETONESUR NEGATIVE 06/21/2020 2053   PROTEINUR 30 (A) 06/21/2020 2053   NITRITE NEGATIVE 06/21/2020 2053   LEUKOCYTESUR NEGATIVE 06/21/2020 2053   Sepsis Labs Invalid input(s): PROCALCITONIN,  WBC,  LACTICIDVEN Microbiology Recent Results (from the past 240 hour(s))   Respiratory Panel by RT PCR (Flu A&B, Covid) - Nasopharyngeal Swab     Status: None   Collection Time: 06/21/20 10:40 PM   Specimen: Nasopharyngeal Swab  Result Value Ref Range Status   SARS Coronavirus 2 by RT PCR NEGATIVE NEGATIVE Final    Comment: (NOTE) SARS-CoV-2 target nucleic acids are NOT DETECTED.  The SARS-CoV-2 RNA is generally detectable in upper respiratoy specimens during the acute phase of infection. The lowest concentration of SARS-CoV-2 viral copies this assay can detect is 131 copies/mL. A negative result does not preclude SARS-Cov-2 infection and should not be used as the sole basis for treatment or other patient management decisions. A negative result may occur with  improper specimen collection/handling, submission of specimen other than nasopharyngeal swab, presence of viral mutation(s) within the areas targeted by this assay, and inadequate number of viral copies (<131 copies/mL). A negative result must be combined with clinical observations, patient history, and epidemiological information. The expected result is Negative.  Fact Sheet for Patients:  PinkCheek.be  Fact Sheet for Healthcare Providers:  GravelBags.it  This test is no t yet approved or cleared by the [place] FDA and  has been authorized for detection and/or diagnosis of SARS-CoV-2 by FDA under an Emergency Use Authorization (EUA). This EUA will remain  in effect (meaning this test can be used) for the duration of the COVID-19 declaration under Section 564(b)(1) of the Act, 21 U.S.C. section 360bbb-3(b)(1), unless the authorization is terminated or revoked sooner.     Influenza A by PCR NEGATIVE NEGATIVE Final   Influenza B by PCR NEGATIVE NEGATIVE Final    Comment: (NOTE) The Xpert Xpress SARS-CoV-2/FLU/RSV assay is intended as an aid in  the diagnosis of influenza from Nasopharyngeal swab specimens and  should not be used as  a sole basis for treatment. Nasal washings and  aspirates are unacceptable for Xpert Xpress SARS-CoV-2/FLU/RSV  testing.  Fact Sheet for Patients: PinkCheek.be  Fact Sheet for Healthcare Providers: GravelBags.it  This test is not yet approved or cleared by the [place] FDA and  has been authorized for detection and/or diagnosis of SARS-CoV-2 by  FDA under an Emergency Use Authorization (EUA). This EUA will remain  in effect (meaning this test can be used) for the duration of the  Covid-19 declaration under Section 564(b)(1) of the Act, 21  U.S.C. section 360bbb-3(b)(1), unless the authorization is  terminated or revoked. Performed at [facility], [address], [place], [place] [address]      Time coordinating discharge: 32 minutes.   SIGNED:  Hosie Poisson, MD  [facility]

## 2020-06-28 ENCOUNTER — Ambulatory Visit (INDEPENDENT_AMBULATORY_CARE_PROVIDER_SITE_OTHER): Payer: BC Managed Care – PPO | Admitting: Nurse Practitioner

## 2020-06-28 ENCOUNTER — Other Ambulatory Visit: Payer: Self-pay

## 2020-06-28 ENCOUNTER — Encounter: Payer: Self-pay | Admitting: Nurse Practitioner

## 2020-06-28 VITALS — BP 125/71 | HR 73 | Temp 98.4°F | Resp 20 | Ht 67.0 in | Wt 214.0 lb

## 2020-06-28 DIAGNOSIS — F1020 Alcohol dependence, uncomplicated: Secondary | ICD-10-CM

## 2020-06-28 DIAGNOSIS — Z09 Encounter for follow-up examination after completed treatment for conditions other than malignant neoplasm: Secondary | ICD-10-CM | POA: Diagnosis not present

## 2020-06-28 DIAGNOSIS — R404 Transient alteration of awareness: Secondary | ICD-10-CM

## 2020-06-28 NOTE — Patient Instructions (Signed)
Alcohol Abuse and Dependence Information, Adult Alcohol is a widely available drug. People drink alcohol in different amounts. People who drink alcohol very often and in large amounts often have problems during and after drinking. They may develop what is called an alcohol use disorder. There are two main types of alcohol use disorders:  Alcohol abuse. This is when you use alcohol too much or too often. You may use alcohol to make yourself feel happy or to reduce stress. You may have a hard time setting a limit on the amount you drink.  Alcohol dependence. This is when you use alcohol consistently for a period of time, and your body changes as a result. This can make it hard to stop drinking because you may start to feel sick or feel different when you do not use alcohol. These symptoms are known as withdrawal. How can alcohol abuse and dependence affect me? Alcohol abuse and dependence can have a negative effect on your life. Drinking too much can lead to addiction. You may feel like you need alcohol to function normally. You may drink alcohol before work in the morning, during the day, or as soon as you get home from work in the evening. These actions can result in:  Poor work performance.  Job loss.  Financial problems.  Car crashes or criminal charges from driving after drinking alcohol.  Problems in your relationships with friends and family.  Losing the trust and respect of coworkers, friends, and family. Drinking heavily over a long period of time can permanently damage your body and brain, and can cause lifelong health issues, such as:  Damage to your liver or pancreas.  Heart problems, high blood pressure, or stroke.  Certain cancers.  Decreased ability to fight infections.  Brain or nerve damage.  Depression.  Early (premature) death. If you are careless or you crave alcohol, it is easy to drink more than your body can handle (overdose). Alcohol overdose is a serious  situation that requires hospitalization. It may lead to permanent injuries or death. What can increase my risk?  Having a family history of alcohol abuse.  Having depression or other mental health conditions.  Beginning to drink at an early age.  Binge drinking often.  Experiencing trauma, stress, and an unstable home life during childhood.  Spending time with people who drink often. What actions can I take to prevent or manage alcohol abuse and dependence?  Do not drink alcohol if: ? Your health care provider tells you not to drink. ? You are pregnant, may be pregnant, or are planning to become pregnant.  If you drink alcohol: ? Limit how much you use to:  0-1 drink a day for women.  0-2 drinks a day for men. ? Be aware of how much alcohol is in your drink. In the [place] one drink equals one 12 oz bottle of beer (355 mL), one 5 oz glass of wine (148 mL), or one 1 oz glass of hard liquor (44 mL).  Stop drinking if you have been drinking too much. This can be very hard to do if you are used to abusing alcohol. If you begin to have withdrawal symptoms, talk with your health care provider or a person that you trust. These symptoms may include anxiety, shaky hands, headache, nausea, sweating, or not being able to sleep.  Choose to drink nonalcoholic beverages in social gatherings and places where there may be alcohol. Activity  Spend more time on activities that you enjoy that do   not involve alcohol, like hobbies or exercise.  Find healthy ways to cope with stress, such as exercise, meditation, or spending time with people you care about. General information  Talk to your family, coworkers, and friends about supporting you in your efforts to stop drinking. If they drink, ask them not to drink around you. Spend more time with people who do not drink alcohol.  If you think that you have an alcohol dependency problem: ? Tell friends or family about your concerns. ? Talk with your  health care provider or another health professional about where to get help. ? Work with a therapist and a chemical dependency counselor. ? Consider joining a support group for people who struggle with alcohol abuse and dependence. Where to find support   Your health care provider.  SMART Recovery: www.smartrecovery.org Therapy and support groups  Local treatment centers or chemical dependency counselors.  Local AA groups in your community: www.aa.org Where to find more information  Centers for Disease Control and Prevention: www.cdc.gov  National Institute on Alcohol Abuse and Alcoholism: www.niaaa.nih.gov  Alcoholics Anonymous (AA): www.aa.org Contact a health care provider if:  You drank more or for longer than you intended on more than one occasion.  You tried to stop drinking or to cut back on how much you drink, but you were not able to.  You often drink to the point of vomiting or passing out.  You want to drink so badly that you cannot think about anything else.  You have problems in your life due to drinking, but you continue to drink.  You keep drinking even though you feel anxious, depressed, or have experienced memory loss.  You have stopped doing the things you used to enjoy in order to drink.  You have to drink more than you used to in order to get the effect you want.  You experience anxiety, sweating, nausea, shakiness, and trouble sleeping when you try to stop drinking. Get help right away if:  You have thoughts about hurting yourself or others.  You have serious withdrawal symptoms, including: ? Confusion. ? Racing heart. ? High blood pressure. ? Fever. If you ever feel like you may hurt yourself or others, or have thoughts about taking your own life, get help right away. You can go to your nearest emergency department or call:  Your local emergency services (911 in the [place]  A suicide crisis helpline, such as the National Suicide Prevention  Lifeline at 1-800-273-8255. This is open 24 hours a day. Summary  Alcohol abuse and dependence can have a negative effect on your life. Drinking too much or too often can lead to addiction.  If you drink alcohol, limit how much you use.  If you are having trouble keeping your drinking under control, find ways to change your behavior. Hobbies, calming activities, exercise, or support groups can help.  If you feel you need help with changing your drinking habits, talk with your health care provider, a good friend, or a therapist, or go to an AA group. This information is not intended to replace advice given to you by your health care provider. Make sure you discuss any questions you have with your health care provider. Document Revised: 11/26/2018 Document Reviewed: 10/15/2018 Elsevier Patient Education  2020 [employer]  

## 2020-06-28 NOTE — Progress Notes (Signed)
Subjective:    Patient ID: Daniel Griffin, male    DOB: 1961/05/08, 59 y.o.   MRN: [id number]   Chief Complaint: Hospitalization Follow-up   HPI Patient went to hospital on November 1 with altered mental status.they admitted him and ran a bunch if test on him which all came back negative. He has been an alcoholic for years. That particular day he had not had a drink in over 24 hours and the neurologist thinks that he had a alcohol withdrawal seizure. He had a EEG before he was discharged from the hospital which was normal for no seizure activity. Has had no alcohol since he was discharged. He is currently staying with his daughter and her husband. He is better. Has occasional confusion but is getting better daily. 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 He still does not think his drinking was a problem.   Review of Systems  Constitutional: Negative for diaphoresis.  Eyes: Negative for pain.  Respiratory: Negative for shortness of breath.   Cardiovascular: Negative for chest pain, palpitations and leg swelling.  Gastrointestinal: Negative for abdominal pain.  Endocrine: Negative for polydipsia.  Skin: Negative for rash.  Neurological: Negative for dizziness, weakness and headaches.  Hematological: Does not bruise/bleed easily.  All other systems reviewed and are negative.      Objective:   Physical Exam Vitals and nursing note reviewed.  Constitutional:      Appearance: Normal appearance. He is well-developed.  HENT:     Head: Normocephalic.     Nose: Nose normal.  Eyes:     Pupils: Pupils are equal, round, and reactive to light.  Neck:     Thyroid: No thyroid mass or thyromegaly.     Vascular: No carotid bruit or JVD.     Trachea: Phonation normal.  Cardiovascular:     Rate and Rhythm: Normal rate and regular rhythm.  Pulmonary:     Effort: Pulmonary effort is normal. No respiratory  distress.     Breath sounds: Normal breath sounds.  Abdominal:     General: Bowel sounds are normal.     Palpations: Abdomen is soft.     Tenderness: There is no abdominal tenderness.  Musculoskeletal:        General: Normal range of motion.     Cervical back: Normal range of motion and neck supple.  Lymphadenopathy:     Cervical: No cervical adenopathy.  Skin:    General: Skin is warm and dry.  Neurological:     Mental Status: He is alert and oriented to person, place, and time.  Psychiatric:        Behavior: Behavior normal.        Thought Content: Thought content normal.        Judgment: Judgment normal.     BP 125/71   Pulse 73   Temp 98.4 F (36.9 C) (Temporal)   Resp 20   Ht $R'5\' 7"'SY$  (1.702 m)   Wt 214 lb (97.1 kg)   SpO2 97%   BMI 33.52 kg/m       Assessment & Plan:  Daniel Griffin in today with chief complaint of Hospitalization Follow-up   1. Transient alteration of awareness Labs pending today - Ambulatory referral to Neurology - CBC with Differential/Platelet - CMP14+EGFR  2. Hospital discharge follow-up Hospital records reviewed  3. 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 The patient verbalized understanding of and has agreed to the management plan. Patient is aware to call the clinic if symptoms persist or worsen. Patient is aware when to return to the clinic for a follow-up visit. Patient educated on when it is appropriate to go to the emergency department.   Mary-Margaret Hassell Done, FNP

## 2020-06-29 LAB — CBC WITH DIFFERENTIAL/PLATELET
Basophils Absolute: 0 10*3/uL (ref 0.0–0.2)
Basos: 1 %
EOS (ABSOLUTE): 0.1 10*3/uL (ref 0.0–0.4)
Eos: 2 %
Hematocrit: 38.3 % (ref 37.5–51.0)
Hemoglobin: 13.7 g/dL (ref 13.0–17.7)
Immature Grans (Abs): 0 10*3/uL (ref 0.0–0.1)
Immature Granulocytes: 0 %
Lymphocytes Absolute: 1.3 10*3/uL (ref 0.7–3.1)
Lymphs: 23 %
MCH: 34.6 pg — ABNORMAL HIGH (ref 26.6–33.0)
MCHC: 35.8 g/dL — ABNORMAL HIGH (ref 31.5–35.7)
MCV: 97 fL (ref 79–97)
Monocytes Absolute: 0.9 10*3/uL (ref 0.1–0.9)
Monocytes: 16 %
Neutrophils Absolute: 3.3 10*3/uL (ref 1.4–7.0)
Neutrophils: 58 %
Platelets: 219 10*3/uL (ref 150–450)
RBC: 3.96 x10E6/uL — ABNORMAL LOW (ref 4.14–5.80)
RDW: 11.7 % (ref 11.6–15.4)
WBC: 5.8 10*3/uL (ref 3.4–10.8)

## 2020-06-29 LAB — CMP14+EGFR
ALT: 27 IU/L (ref 0–44)
AST: 21 IU/L (ref 0–40)
Albumin/Globulin Ratio: 1.5 (ref 1.2–2.2)
Albumin: 4.1 g/dL (ref 3.8–4.9)
Alkaline Phosphatase: 61 IU/L (ref 44–121)
BUN/Creatinine Ratio: 11 (ref 9–20)
BUN: 11 mg/dL (ref 6–24)
Bilirubin Total: 0.6 mg/dL (ref 0.0–1.2)
CO2: 25 mmol/L (ref 20–29)
Calcium: 8.9 mg/dL (ref 8.7–10.2)
Chloride: 101 mmol/L (ref 96–106)
Creatinine, Ser: 0.98 mg/dL (ref 0.76–1.27)
GFR calc Af Amer: 97 mL/min/{1.73_m2} (ref >59)
GFR calc non Af Amer: 84 mL/min/{1.73_m2} (ref >59)
Globulin, Total: 2.7 g/dL (ref 1.5–4.5)
Glucose: 99 mg/dL (ref 65–99)
Potassium: 4.6 mmol/L (ref 3.5–5.2)
Sodium: 138 mmol/L (ref 134–144)
Total Protein: 6.8 g/dL (ref 6.0–8.5)

## 2020-07-19 MED FILL — ORENCIA CLICKJECT 125 MG/ML: 125 | 28 days supply | Qty: 4 | Fill #1

## 2020-07-20 ENCOUNTER — Ambulatory Visit: Payer: BC Managed Care – PPO | Admitting: Neurology

## 2020-07-27 ENCOUNTER — Encounter: Payer: Self-pay | Admitting: Nurse Practitioner

## 2020-07-27 ENCOUNTER — Other Ambulatory Visit: Payer: Self-pay

## 2020-07-27 ENCOUNTER — Ambulatory Visit (INDEPENDENT_AMBULATORY_CARE_PROVIDER_SITE_OTHER): Payer: BC Managed Care – PPO | Admitting: Nurse Practitioner

## 2020-07-27 VITALS — BP 106/61 | HR 73 | Temp 97.2°F | Resp 20 | Ht 67.0 in | Wt 215.0 lb

## 2020-07-27 DIAGNOSIS — F10230 Alcohol dependence with withdrawal, uncomplicated: Secondary | ICD-10-CM | POA: Diagnosis not present

## 2020-07-27 DIAGNOSIS — F10930 Alcohol use, unspecified with withdrawal, uncomplicated: Secondary | ICD-10-CM

## 2020-07-27 DIAGNOSIS — M0520 Rheumatoid vasculitis with rheumatoid arthritis of unspecified site: Secondary | ICD-10-CM

## 2020-07-27 MED ORDER — CELECOXIB 200 MG PO CAPS: 200.0000 mg | ORAL_CAPSULE | Freq: Two times a day (BID) | ORAL | 2 refills | Status: DC

## 2020-07-27 NOTE — Progress Notes (Signed)
   Subjective:    Patient ID: Daniel Griffin, male    DOB: [birth year], 59 y.o.   MRN: [id number]   Chief Complaint: Medical Management of Chronic Issues   HPI Patient comes in today for rechek. He had episode of altered mental status a month ago that they are saying is from alcohol withdrawal. 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 He has been doing well since discharge from the hospital. He has been staying mainly at his daughters house at night . He denies any acohol consumption sinc elast visit. Says he is feeling well his daughter says that he memory is not very good, but other wise he is okay. 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 He want sto o back to work , but his only concern is climbing up on equipment. hhe has RA and he used to alcohol to mask his pain.   Review of Systems  Constitutional: Negative.   Respiratory: Negative.   Genitourinary: Negative.   Musculoskeletal: Negative.   All other systems reviewed and are negative.      Objective:   Physical Exam Vitals and nursing note reviewed.  Constitutional:      Appearance: Normal appearance.  Cardiovascular:     Rate and Rhythm: Normal rate and regular rhythm.     Heart sounds: Normal heart sounds.  Pulmonary:     Breath sounds: Normal breath sounds.  Musculoskeletal:     Comments: All over joint pain with any movement  Neurological:     Mental Status: He is alert.    BP 106/61   Pulse 73   Temp (!) 97.2 F (36.2 C) (Temporal)   Resp 20   Ht 5\' 7"  (1.702 m)   Wt 215 lb (97.5 kg)   BMI 33.67 kg/m         Assessment & Plan:  Daniel Griffin in today with chief complaint of Medical Management of Chronic Issues   1. Rheumatoid arteritis (HCC) Keep joints warm - celecoxib (CELEBREX) 200 MG capsule; Take 1 capsule (200 mg total) by mouth 2 (two) times daily.  Dispense: 60 capsule; Refill: 2  2. Alcohol  withdrawal syndrome without complication ([place]) Continue to avoid alcohol will allow him to drive again Back to work first of January    The above assessment and management plan was discussed with the patient. The patient verbalized understanding of and has agreed to the management plan. Patient is aware to call the clinic if symptoms persist or worsen. Patient is aware when to return to the clinic for a follow-up visit. Patient educated on when it is appropriate to go to the emergency department.   Mary-Margaret Hassell Done, FNP

## 2020-08-19 ENCOUNTER — Telehealth: Payer: Self-pay

## 2020-08-19 MED FILL — ORENCIA CLICKJECT 125 MG/ML: 125 | 28 days supply | Qty: 4 | Fill #2

## 2020-08-19 NOTE — Telephone Encounter (Signed)
Daughter aware will get back with [place] on what to wright in letter

## 2020-08-19 NOTE — Telephone Encounter (Signed)
Patient is supposed to go back to work Monday. He is taking Celebrex twice daily as prescribed but still having a lot of pain and swelling in joints. He has been using a crutch because his knee is hurting him so bad. Can the celebrex be increased or can he have something else to help him out? Also can he have a note for work to extend his leave? Please review and advise

## 2020-08-19 NOTE — Telephone Encounter (Signed)
Patient's sister called and states he is out of orencia and his primary care doctor is out of the medication. I advised her that our office prescribes that medication and he fills through [facility]. We sent in a 12 week supply on 06/18/2020. Devki checked the pharmacy system and there is a refill at the pharmacy. I provided the patient and his sister the phone number for [facility] to coordinate the refill. They verbalized understanding and will contact [facility].

## 2020-08-19 NOTE — Telephone Encounter (Signed)
Ok to extend work  note. Needs  to speak with rheumatology about his pain

## 2020-08-25 ENCOUNTER — Telehealth: Payer: Self-pay

## 2020-08-25 NOTE — Progress Notes (Signed)
Office Visit Note  Patient: Daniel Griffin             Date of Birth: 03/11/1961           MRN: [id number]             PCP: Default, Provider, MD Referring: No ref. provider found Visit Date: 08/27/2020 Occupation: @GUAROCC @  Subjective:  In multiple joints.   History of Present Illness: Daniel Griffin is a 60 y.o. male  accompanied by his sister today.  He has history of seropositive rheumatoid arthritis.  According to his sister for the last 3 years he has not been doing well.  The symptoms are gradually getting worse.  He complains of pain in all of his joints.  He does very physically demanding job and it is becoming very difficult for him to do his job anymore.  He has missed several days of his work.  She states in November he had an episode when he started experiencing sudden weakness and there was a question about seizure versus stroke.  He has some memory loss and difficulty remembering things.  He complains of pain and discomfort in his bilateral shoulders, bilateral hands, bilateral knee joints.  He complains of swelling in his hands.  He believes Maureen Chatters is not working.  Activities of Daily Living:  Patient reports morning stiffness for 1-24 hours.   Patient Reports nocturnal pain.  Difficulty dressing/grooming: Reports Difficulty climbing stairs: Reports Difficulty getting out of chair: Reports Difficulty using hands for taps, buttons, cutlery, and/or writing: Reports  Review of Systems  Constitutional: Positive for fatigue.  HENT: Negative for mouth sores, mouth dryness and nose dryness.   Eyes: Negative for pain, itching, visual disturbance and dryness.  Respiratory: Negative for cough, hemoptysis, shortness of breath and difficulty breathing.   Cardiovascular: Positive for swelling in legs/feet. Negative for chest pain and palpitations.  Gastrointestinal: Negative for abdominal pain, blood in stool, constipation and diarrhea.  Endocrine: Negative for increased  urination.  Genitourinary: Negative for painful urination.  Musculoskeletal: Positive for arthralgias, joint pain, joint swelling, muscle weakness, morning stiffness and muscle tenderness. Negative for myalgias and myalgias.  Skin: Negative for color change, rash and redness.  Allergic/Immunologic: Negative for susceptible to infections.  Neurological: Positive for dizziness and headaches. Negative for numbness, memory loss and weakness.  Hematological: Negative for swollen glands.  Psychiatric/Behavioral: Positive for sleep disturbance. Negative for confusion.    PMFS History:  Patient Active Problem List   Diagnosis Date Noted  . Rheumatoid arteritis ([place]) 06/22/2020  . AMS (altered mental status) 06/21/2020  . Pain in left knee 04/16/2019  . Transaminitis 07/04/2018  . Leukopenia 05/22/2018  . Rheumatoid arthritis involving multiple sites with positive rheumatoid factor ([place]) 11/30/2016  . High risk medications (not anticoagulants) long-term use 11/30/2016  . Pain in both hands 11/30/2016  . Pain in joints of both feet 11/30/2016    Past Medical History:  Diagnosis Date  . Arthritis     Family History  Problem Relation Age of Onset  . Alzheimer's disease Father   . Healthy Son   . Healthy Daughter   . Rheum arthritis Daughter   . Healthy Daughter    Past Surgical History:  Procedure Laterality Date  . ANKLE FRACTURE SURGERY     Left  . APPENDECTOMY     Social History   Social History Narrative   ** Merged History Encounter **       Immunization History  Administered Date(s) Administered  .  Influenza,inj,Quad PF,6+ Mos 07/04/2018     Objective: Vital Signs: BP 137/69 (BP Location: Right Arm, Patient Position: Sitting, Cuff Size: Normal)   Pulse 79   Ht 5' 6.5" (1.689 m)   Wt 217 lb 6.4 oz (98.6 kg)   BMI 34.56 kg/m    Physical Exam Vitals and nursing note reviewed.  Constitutional:      Appearance: He is well-developed and well-nourished.  HENT:      Head: Normocephalic and atraumatic.  Eyes:     Extraocular Movements: EOM normal.     Conjunctiva/sclera: Conjunctivae normal.     Pupils: Pupils are equal, round, and reactive to light.  Cardiovascular:     Rate and Rhythm: Normal rate and regular rhythm.     Heart sounds: Normal heart sounds.  Pulmonary:     Effort: Pulmonary effort is normal.     Breath sounds: Normal breath sounds.  Abdominal:     General: Bowel sounds are normal.     Palpations: Abdomen is soft.  Musculoskeletal:     Cervical back: Normal range of motion and neck supple.  Skin:    General: Skin is warm and dry.     Capillary Refill: Capillary refill takes less than 2 seconds.  Neurological:     Mental Status: He is alert and oriented to person, place, and time.  Psychiatric:        Mood and Affect: Mood and affect normal.        Behavior: Behavior normal.      Musculoskeletal Exam: C-spine was in good range of motion.  He had painful range of motion of bilateral shoulder joints.  No warmth swelling or effusion was noted in the shoulder joints.  Elbow joints and wrist joints with good range of motion.  No synovitis was noted over MCPs PIPs or DIPs.  PIP and DIP thickening was noted.  Good range of motion of hip joints and knee joints without any warmth swelling or effusion.  He had no tenderness over ankles or MTPs.  CDAI Exam: CDAI Score: 5.1  Patient Global: 8 mm; Provider Global: 3 mm Swollen: 0 ; Tender: 4  Joint Exam 08/27/2020      Right  Left  Glenohumeral   Tender   Tender  Knee   Tender   Tender     Investigation: No additional findings.  Imaging: No results found.  Recent Labs: Lab Results  Component Value Date   WBC 5.8 06/28/2020   HGB 13.7 06/28/2020   PLT 219 06/28/2020   NA 138 06/28/2020   K 4.6 06/28/2020   CL 101 06/28/2020   CO2 25 06/28/2020   GLUCOSE 99 06/28/2020   BUN 11 06/28/2020   CREATININE 0.98 06/28/2020   BILITOT 0.6 06/28/2020   ALKPHOS 61 06/28/2020    AST 21 06/28/2020   ALT 27 06/28/2020   PROT 6.8 06/28/2020   ALBUMIN 4.1 06/28/2020   CALCIUM 8.9 06/28/2020   GFRAA 97 06/28/2020   QFTBGOLD NEGATIVE 07/03/2017   QFTBGOLDPLUS Negative 08/08/2019    Speciality Comments: Prior therapy includes: Enbrel (pancytopenia) and methotrexate (inadequate response) Orencia-07/09/18 Enbrel-1/19-10/19  Procedures:  No procedures performed Allergies: Patient has no known allergies.   Assessment / Plan:     Visit Diagnoses: Rheumatoid arthritis involving multiple sites with positive rheumatoid factor ([place]) -he complains of pain and discomfort in multiple joints.  He complains of increased pain and swelling in his bilateral hands.  He states he is having difficulty doing routine activities.  No synovitis was noted on my examination today.  He has been tolerating Orencia without any problems.  Plan: Sedimentation rate  High risk medications (not anticoagulants) long-term use -methotrexate was discontinued in the past due to neutropenia.  He did well on Enbrel but it was also discontinued due to neutropenia.  He has been doing well on Orencia.  He states he has been having increased pain and swelling now.  His labs are stable and neutropenia has resolved.  Plan: QuantiFERON-TB Gold Plus today.  History of neutropenia-he had neutropenia in the past on methotrexate and Enbrel.  Chronic pain of both shoulders -he complains of pain and discomfort in his bilateral shoulders.  Plan: XR Shoulder Left, XR Shoulder Right.  X-rays were consistent with osteoarthritic changes and acromioclavicular arthritis.  I will refer him for orthopedic evaluation.  Primary osteoarthritis of both hands-he complains of pain, discomfort and swelling in his bilateral hands.  No synovitis was noted.  PIP and DIP thickening was noted.  Chronic pain of both knees -he complains of discomfort in his bilateral knee joints and difficulty walking.  No warmth swelling or effusion was noted.   Plan: XR KNEE 3 VIEW RIGHT, XR KNEE 3 VIEW LEFT.  X-ray bilateral knee joint showed severe osteoarthritis and severe chondromalacia patella.  Left knee joint is worse in the right knee joint.  Patient is having difficulty with mobility due to severe arthritis.  I had detailed discussion with the patient and his sister.  We will refer him to orthopedic surgery for evaluation and possible total knee replacement.  Primary osteoarthritis of both feet-he has some discomfort in her feet.  Myalgia - Plan: CK, TSH.  Patient also has an appointment coming up with neurology due to generalized weakness.  His sister states that he had an episode in November with questionable seizure versus a stroke.  Educated about COVID-19 virus infection-he has not been vaccinated against COVID-19.  Risk of not getting immunization was discussed at length.  Use of mask, social distancing and hand hygiene was emphasized.  Instructions were placed in the AVS.  Orders: Orders Placed This Encounter  Procedures  . XR Shoulder Left  . XR Shoulder Right  . XR KNEE 3 VIEW RIGHT  . XR KNEE 3 VIEW LEFT  . QuantiFERON-TB Gold Plus  . CK  . TSH  . Sedimentation rate   No orders of the defined types were placed in this encounter.    Follow-Up Instructions: Return in about 2 months (around 10/25/2020) for Rheumatoid arthritis.   Pollyann Savoy, MD  Note - This record has been created using Animal nutritionist.  Chart creation errors have been sought, but may not always  have been located. Such creation errors do not reflect on  the standard of medical care.

## 2020-08-25 NOTE — Telephone Encounter (Signed)
FYI:  Patient's sister called stating he is still experiencing pain and swelling in his hands and feet.  Patient was prescribed Celebrex by his PCP.  He is not able to work and doesn't think the [place] is working.  Scheduled appointment for 08/27/20 at 11:00 am.

## 2020-08-25 NOTE — Telephone Encounter (Signed)
We will discuss tt options at the fu visit.

## 2020-08-27 ENCOUNTER — Encounter: Payer: Self-pay | Admitting: Rheumatology

## 2020-08-27 ENCOUNTER — Ambulatory Visit: Payer: Self-pay

## 2020-08-27 ENCOUNTER — Ambulatory Visit (INDEPENDENT_AMBULATORY_CARE_PROVIDER_SITE_OTHER): Payer: BC Managed Care – PPO | Admitting: Rheumatology

## 2020-08-27 ENCOUNTER — Other Ambulatory Visit: Payer: Self-pay

## 2020-08-27 VITALS — BP 137/69 | HR 79 | Ht 66.5 in | Wt 217.4 lb

## 2020-08-27 DIAGNOSIS — M19042 Primary osteoarthritis, left hand: Secondary | ICD-10-CM

## 2020-08-27 DIAGNOSIS — M25512 Pain in left shoulder: Secondary | ICD-10-CM | POA: Diagnosis not present

## 2020-08-27 DIAGNOSIS — D72818 Other decreased white blood cell count: Secondary | ICD-10-CM

## 2020-08-27 DIAGNOSIS — M0579 Rheumatoid arthritis with rheumatoid factor of multiple sites without organ or systems involvement: Secondary | ICD-10-CM

## 2020-08-27 DIAGNOSIS — G8929 Other chronic pain: Secondary | ICD-10-CM | POA: Diagnosis not present

## 2020-08-27 DIAGNOSIS — M19071 Primary osteoarthritis, right ankle and foot: Secondary | ICD-10-CM

## 2020-08-27 DIAGNOSIS — M25562 Pain in left knee: Secondary | ICD-10-CM

## 2020-08-27 DIAGNOSIS — Z79899 Other long term (current) drug therapy: Secondary | ICD-10-CM | POA: Diagnosis not present

## 2020-08-27 DIAGNOSIS — M19072 Primary osteoarthritis, left ankle and foot: Secondary | ICD-10-CM

## 2020-08-27 DIAGNOSIS — M19041 Primary osteoarthritis, right hand: Secondary | ICD-10-CM

## 2020-08-27 DIAGNOSIS — M25511 Pain in right shoulder: Secondary | ICD-10-CM

## 2020-08-27 DIAGNOSIS — Z862 Personal history of diseases of the blood and blood-forming organs and certain disorders involving the immune mechanism: Secondary | ICD-10-CM

## 2020-08-27 DIAGNOSIS — M25561 Pain in right knee: Secondary | ICD-10-CM | POA: Diagnosis not present

## 2020-08-27 DIAGNOSIS — Z7189 Other specified counseling: Secondary | ICD-10-CM

## 2020-08-27 DIAGNOSIS — M791 Myalgia, unspecified site: Secondary | ICD-10-CM | POA: Diagnosis not present

## 2020-08-27 NOTE — Patient Instructions (Addendum)
Standing Labs We placed an order today for your standing lab work.   Please have your standing labs drawn in February and every 3 months   If possible, please have your labs drawn 2 weeks prior to your appointment so that the provider can discuss your results at your appointment.  We have open lab daily Monday through Thursday from 8:30-12:30 PM and 1:30-4:30 PM and Friday from 8:30-12:30 PM and 1:30-4:00 PM at the office of Dr. Rakiya Krawczyk, [facility].   Please be advised, patients with office appointments requiring lab work will take precedents over walk-in lab work.  If possible, please come for your lab work on Monday and Friday afternoons, as you may experience shorter wait times. The office is located at [address], [address], [place], [place] [address] No appointment is necessary.   Labs are drawn by [facility]. Please bring your co-pay at the time of your lab draw.  You may receive a bill from [facility] for your lab work.  If you wish to have your labs drawn at another location, please call the office 24 hours in advance to send orders.  If you have any questions regarding directions or hours of operation,  please call [phone].   As a reminder, please drink plenty of water prior to coming for your lab work. Thanks!  COVID-19 vaccine recommendations:   COVID-19 vaccine is recommended for everyone (unless you are allergic to a vaccine component), even if you are on a medication that suppresses your immune system.   If you are on Methotrexate, Cellcept (mycophenolate), Rinvoq, Xeljanz, and Olumiant- hold the medication for 1 week after each vaccine. Hold Methotrexate for 2 weeks after the single dose COVID-19 vaccine.   If you are on Orencia subcutaneous injection - hold medication one week prior to and one week after the first COVID-19 vaccine dose (only).   If you are on Orencia IV infusions- time vaccination administration so that the first COVID-19  vaccination will occur four weeks after the infusion and postpone the subsequent infusion by one week.   If you are on Cyclophosphamide or Rituxan infusions please contact your doctor prior to receiving the COVID-19 vaccine.   Do not take Tylenol or any anti-inflammatory medications (NSAIDs) 24 hours prior to the COVID-19 vaccination.   There is no direct evidence about the efficacy of the COVID-19 vaccine in individuals who are on medications that suppress the immune system.   Even if you are fully vaccinated, and you are on any medications that suppress your immune system, please continue to wear a mask, maintain at least six feet social distance and practice hand hygiene.   If you develop a COVID-19 infection, please contact your PCP or our office to determine if you need monoclonal antibody infusion.  The booster vaccine is now available for immunocompromised patients.   Please see the following web sites for updated information.   [email or web address]     

## 2020-08-29 LAB — QUANTIFERON-TB GOLD PLUS
Mitogen-NIL: 1.28 IU/mL
NIL: 0.13 IU/mL
QuantiFERON-TB Gold Plus: NEGATIVE
TB1-NIL: 0.02 IU/mL
TB2-NIL: 0.03 IU/mL

## 2020-08-29 LAB — CK: Total CK: 108 U/L (ref 44–196)

## 2020-08-29 LAB — TSH: TSH: 1.47 mIU/L (ref 0.40–4.50)

## 2020-08-29 LAB — SEDIMENTATION RATE: Sed Rate: 55 mm/h — ABNORMAL HIGH (ref 0–20)

## 2020-08-29 NOTE — Progress Notes (Signed)
Muscle enzyme test and thyroid function are normal. Sed rate is elevated which may indicate inflammation.We can give a prednisone taper starting at 20mg  and taper by 5 mg po q 4 days. If he notices significant improvement then Arava 10 mg po qd can be added at the fu visit with close monitoring for neutropenia.

## 2020-08-30 ENCOUNTER — Other Ambulatory Visit: Payer: Self-pay | Admitting: *Deleted

## 2020-08-30 MED ORDER — PREDNISONE 5 MG PO TABS: ORAL_TABLET | ORAL | 0 refills | Status: DC

## 2020-08-31 DIAGNOSIS — M25562 Pain in left knee: Secondary | ICD-10-CM | POA: Diagnosis not present

## 2020-08-31 DIAGNOSIS — M25512 Pain in left shoulder: Secondary | ICD-10-CM | POA: Diagnosis not present

## 2020-08-31 DIAGNOSIS — M25561 Pain in right knee: Secondary | ICD-10-CM | POA: Diagnosis not present

## 2020-09-14 ENCOUNTER — Encounter: Payer: Self-pay | Admitting: Neurology

## 2020-09-14 ENCOUNTER — Ambulatory Visit (INDEPENDENT_AMBULATORY_CARE_PROVIDER_SITE_OTHER): Payer: BC Managed Care – PPO | Admitting: Neurology

## 2020-09-14 ENCOUNTER — Ambulatory Visit: Payer: BC Managed Care – PPO | Admitting: Neurology

## 2020-09-14 ENCOUNTER — Other Ambulatory Visit: Payer: Self-pay

## 2020-09-14 VITALS — BP 123/67 | HR 59 | Ht 66.0 in | Wt 206.4 lb

## 2020-09-14 DIAGNOSIS — F05 Delirium due to known physiological condition: Secondary | ICD-10-CM | POA: Diagnosis not present

## 2020-09-14 DIAGNOSIS — R410 Disorientation, unspecified: Secondary | ICD-10-CM | POA: Diagnosis not present

## 2020-09-14 DIAGNOSIS — G459 Transient cerebral ischemic attack, unspecified: Secondary | ICD-10-CM

## 2020-09-14 DIAGNOSIS — R413 Other amnesia: Secondary | ICD-10-CM | POA: Diagnosis not present

## 2020-09-14 MED ORDER — ASPIRIN EC 81 MG PO TBEC: 81.0000 mg | DELAYED_RELEASE_TABLET | Freq: Every day | ORAL | 11 refills | Status: DC

## 2020-09-14 MED FILL — ORENCIA CLICKJECT 125 MG/ML: 125 | 28 days supply | Qty: 4 | Fill #0

## 2020-09-14 NOTE — Progress Notes (Signed)
[facility] [address] [place]. [place] [address] [phone]       OFFICE CONSULT NOTE  [name] Date of Birth:  01-02-1961 Medical Record Number:  [id number]   Referring MD: Chevis Pretty, FNP Reason for Referral: Transient altered consciousness HPI: Daniel Griffin is a 60 year old Caucasian male with no significant past medical history except rheumatoid arthritis and heavy alcohol intake who is seen today for initial office consultation visit for episode of transient altered consciousness.  History is obtained from the patient and his daughter was accompanying him today as well as review of electronic medical records and imaging film results in [place].  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  A family member who was outside saw him wandering in his backyard and she called out to him but he did not respond and he just wanted off towards the road where there was heavy traffic and would have been hit I did not been stopped.  Patient's family members were called and he was found to be confused and disoriented and did not recognize family members.  He also was not walking right and was dragging his legs and seemed off balance.  The patient is a heavy alcoholic and admits to drinking 6 to 12 pack beer on daily basis and with even higher amounts on weekends.  He had initial CT scan of the head which was unremarkable followed by MRI scan of the brain on 06/22/2020 which showed only changes of small vessel disease and no acute abnormality.  EEG done on 06/23/2020 was normal.  LDL cholesterol 54 mg percent hemoglobin A1c was 5.4.  He was seen by consultation by Dr. Merlene Laughter at [facility] who thought this could be alcohol-related unwitnessed seizure with postictal confusion.  Patient has since separated quit alcohol though he says he may have had a few  beers off and on.  He has not had any recurrent episodes of seizures or altered consciousness.  He denies any headaches.  He does have severe rheumatoid arthritis and recently underwent injection in the shoulder and knees by orthopedics which seem to have helped his gait and balance.  He denies any prior history of significant head injury with loss of consciousness, seizures in childhood, strokes TIAs or migraine headaches.  ROS:   14 system review of systems is positive for confusion, disorientation, memory loss all other systems negative PMH:  Past Medical History:  Diagnosis Date  . Arthritis   . Seizures ([place]) 06/21/2020   ETOH w/d related  Rheumatoid arthritis  Social History:  Social History   Socioeconomic History  . Marital status: Single    Spouse name: Not on file  . Number of children: Not on file  . Years of education: Not on file  . Highest education level: Not on file  Occupational History  . Occupation: not worked since seizure 06/21/20  Tobacco Use  . Smoking status: Never Smoker  . Smokeless tobacco: Current User    Types: Snuff, Chew  Vaping Use  . Vaping Use: Never used  Substance and Sexual Activity  . Alcohol use: Not Currently    Comment: Quit in 06/2020  . Drug use: Yes    Frequency: 2.0 times per week    Types: Marijuana  . Sexual activity: Not on file  Other Topics Concern  . Not on file  Social History Narrative  Lives alone   Right Handed   Rarely drinks caffeine       Social Determinants of Health   Financial Resource Strain: Not on file  Food Insecurity: Not on file  Transportation Needs: Not on file  Physical Activity: Not on file  Stress: Not on file  Social Connections: Not on file  Intimate Partner Violence: Not on file    Medications:   Current Outpatient Medications on File Prior to Visit  Medication Sig Dispense Refill  . Abatacept (ORENCIA CLICKJECT) 332 MG/ML SOAJ Inject 125 mg into the skin once a week. 12 mL 0  .  celecoxib (CELEBREX) 200 MG capsule Take 1 capsule (200 mg total) by mouth 2 (two) times daily. 60 capsule 2  . predniSONE (DELTASONE) 5 MG tablet Take 4 tabs po x 4 days, 3  tabs po x 4 days, 2  tabs po x 4 days, 1  tab po x 4 days 40 tablet 0   No current facility-administered medications on file prior to visit.    Allergies:  No Known Allergies  Physical Exam General: well developed, well nourished middle-aged Caucasian male, seated, in no evident distress Head: head normocephalic and atraumatic.   Neck: supple with no carotid or supraclavicular bruits Cardiovascular: regular rate and rhythm, no murmurs Musculoskeletal: no deformity Skin:  no rash/petichiae Vascular:  Normal pulses all extremities  Neurologic Exam Mental Status: Awake and fully alert. Oriented to place and time. Recent and remote memory intact. Attention s diminished recall 1/3.  Able to name only 9 animals which can walk on 4 legs.  Pan, concentration and fund of knowledge appropriate. Mood and affect appropriate.  Cranial Nerves: Fundoscopic exam reveals sharp disc margins. Pupils equal, briskly reactive to light. Extraocular movements full without nystagmus. Visual fields full to confrontation. Hearing intact. Facial sensation intact. Face, tongue, palate moves normally and symmetrically.  Motor: Normal bulk and tone. Normal strength in all tested extremity muscles. Sensory.: intact to touch , pinprick , position and vibratory sensation.  Coordination: Rapid alternating movements normal in all extremities. Finger-to-nose and heel-to-shin performed accurately bilaterally. Gait and Station: Arises from chair without difficulty. Stance is normal. Gait demonstrates normal stride length and balance . Able to heel, toe and tandem walk without difficulty.  Reflexes: 1+ and symmetric. Toes downgoing.   NIHSS  0 Modified Rankin 1 ASSESSMENT: 60 year old Caucasian male with transient episode of confusion and disorientation  and November 2021 of unclear etiology.  Possibilities include posterior circulation TIA versus unwitnessed seizure with postictal confusion state.  Alcohol related fugue state due to heavy drinking also possible though less likely     PLAN: I had a long discussion with the patient and his daughter regarding this episode of transient confusion and memory loss and discussed differential includes unwitnessed seizure related to his alcohol followed by postictal confusion versus a posterior circulation TIA. Recommend further evaluation with checking CT angiogram of the brain and neck as well as repeat EEG. Start aspirin 81 mg daily for stroke prevention and check memory panel labs. Advised him to do memory compensation strategies and increase participation in cognitively challenging activities like solving crossword puzzles, playing bridge and sodoku.. I complemented him on quitting alcohol and advised him to continue to do so. He will return for follow-up in the future in 3 months or call earlier if necessary.  Greater than 50% time during this 45-minute consultation visit was spent on counseling and coordination of care about his episode of transient altered consciousness and  confusion and answering questions Antony Contras, MD  [facility] [address] [place],  [address]  Phone [phone] Fax [phone] Note: This document was prepared with digital dictation and possible smart phrase technology. Any transcriptional errors that result from this process are unintentional.

## 2020-09-14 NOTE — Patient Instructions (Signed)
I had a long discussion with the patient and his daughter regarding this episode of transient confusion and memory loss and discussed differential includes unwitnessed seizure related to his alcohol followed by postictal confusion versus a posterior circulation TIA. Recommend further evaluation with checking CT angiogram of the brain and neck as well as repeat EEG. Start aspirin 81 mg daily for stroke prevention and check memory panel labs. Advised him to do memory compensation strategies and increase participation in cognitively challenging activities like solving crossword puzzles, playing bridge and sodoku.. I complemented him on quitting alcohol and advised him to continue to do so. He will return for follow-up in the future in 3 months or call earlier if necessary. Memory Compensation Strategies  1. Use "WARM" strategy.  W= write it down  A= associate it  R= repeat it  M= make a mental note  2.   You can keep a Social worker.  Use a 3-ring notebook with sections for the following: calendar, important names and phone numbers,  medications, doctors' names/phone numbers, lists/reminders, and a section to journal what you did  each day.   3.    Use a calendar to write appointments down.  4.    Write yourself a schedule for the day.  This can be placed on the calendar or in a separate section of the Memory Notebook.  Keeping a  regular schedule can help memory.  5.    Use medication organizer with sections for each day or morning/evening pills.  You may need help loading it  6.    Keep a basket, or pegboard by the door.  Place items that you need to take out with you in the basket or on the pegboard.  You may also want to  include a message board for reminders.  7.    Use sticky notes.  Place sticky notes with reminders in a place where the task is performed.  For example: " turn off the  stove" placed by the stove, "lock the door" placed on the door at eye level, " take your medications"  on  the bathroom mirror or by the place where you normally take your medications.  8.    Use alarms/timers.  Use while cooking to remind yourself to check on food or as a reminder to take your medicine, or as a  reminder to make a call, or as a reminder to perform another task, etc.

## 2020-09-15 ENCOUNTER — Telehealth: Payer: Self-pay | Admitting: Neurology

## 2020-09-15 LAB — DEMENTIA PANEL
Homocysteine: 9.6 umol/L (ref 0.0–14.5)
RPR Ser Ql: REACTIVE — AB
TSH: 2.18 u[IU]/mL (ref 0.450–4.500)
Vitamin B-12: 494 pg/mL (ref 232–1245)

## 2020-09-15 LAB — RPR, QUANT. (REFLEX): Rapid Plasma Reagin, Quant: 1:1 {titer} — ABNORMAL HIGH

## 2020-09-15 NOTE — Telephone Encounter (Signed)
Daniel Griffin: [id number] (exp. 09/15/20 to 03/13/21).   Spoke with [name] patient sister on the [place]. She is aware that his CTA's are scheduled at [facility] for Tuesday 10/12/20 arrival time is 1:45 pm. She is also aware liquids only 4 hours prior to the exam and I gave their number of [phone] incase they needed to r/s.

## 2020-09-15 NOTE — Progress Notes (Signed)
I called the patient and gave him results of vitamin B12 and TSH which were normal but his RPR is reactive.  He denied any prior history of syphilis or treatment for it.  I advised him to see his primary physician to consider treatment for syphilis.  He voiced understanding

## 2020-09-16 ENCOUNTER — Other Ambulatory Visit: Payer: Self-pay | Admitting: Nurse Practitioner

## 2020-09-16 DIAGNOSIS — A530 Latent syphilis, unspecified as early or late: Secondary | ICD-10-CM

## 2020-09-21 ENCOUNTER — Encounter: Payer: Self-pay | Admitting: Internal Medicine

## 2020-09-21 ENCOUNTER — Ambulatory Visit (INDEPENDENT_AMBULATORY_CARE_PROVIDER_SITE_OTHER): Payer: BC Managed Care – PPO | Admitting: Internal Medicine

## 2020-09-21 ENCOUNTER — Other Ambulatory Visit: Payer: Self-pay

## 2020-09-21 ENCOUNTER — Ambulatory Visit (INDEPENDENT_AMBULATORY_CARE_PROVIDER_SITE_OTHER): Payer: BC Managed Care – PPO | Admitting: Neurology

## 2020-09-21 VITALS — BP 147/70 | HR 58 | Temp 98.7°F | Ht 66.0 in | Wt 203.0 lb

## 2020-09-21 DIAGNOSIS — F0390 Unspecified dementia without behavioral disturbance: Secondary | ICD-10-CM | POA: Diagnosis not present

## 2020-09-21 DIAGNOSIS — A539 Syphilis, unspecified: Secondary | ICD-10-CM | POA: Diagnosis not present

## 2020-09-21 DIAGNOSIS — R410 Disorientation, unspecified: Secondary | ICD-10-CM

## 2020-09-21 DIAGNOSIS — R413 Other amnesia: Secondary | ICD-10-CM

## 2020-09-21 LAB — BASIC METABOLIC PANEL
BUN: 15 mg/dL (ref 7–25)
CO2: 30 mmol/L (ref 20–32)
Calcium: 9 mg/dL (ref 8.6–10.3)
Chloride: 103 mmol/L (ref 98–110)
Creat: 0.82 mg/dL (ref 0.70–1.25)
Glucose, Bld: 69 mg/dL (ref 65–99)
Potassium: 4.3 mmol/L (ref 3.5–5.3)
Sodium: 140 mmol/L (ref 135–146)

## 2020-09-21 LAB — CBC
HCT: 40 % (ref 38.5–50.0)
Hemoglobin: 13.5 g/dL (ref 13.2–17.1)
MCH: 30.8 pg (ref 27.0–33.0)
MCHC: 33.8 g/dL (ref 32.0–36.0)
MCV: 91.3 fL (ref 80.0–100.0)
MPV: 10.4 fL (ref 7.5–12.5)
Platelets: 218 10*3/uL (ref 140–400)
RBC: 4.38 10*6/uL (ref 4.20–5.80)
RDW: 12.3 % (ref 11.0–15.0)
WBC: 8.4 10*3/uL (ref 3.8–10.8)

## 2020-09-21 NOTE — Patient Instructions (Addendum)
Thank you for coming to see me today. It was a pleasure seeing you.  To Do: Marland Kitchen Scheduled lumbar puncture to rule out neurosyphilis infection as noted below . I will check some baseline labs today . After results of this I will be able to determine appropriate treatment option for you . Follow up in 4 weeks  If you have any questions or concerns, please do not hesitate to call the office at [phone].  Take Care,   Jule Ser, DO

## 2020-09-21 NOTE — Progress Notes (Signed)
Agree with plan 

## 2020-09-21 NOTE — Addendum Note (Signed)
Addended by: Caffie Pinto on: 09/21/2020 02:47 PM   Modules accepted: Orders

## 2020-09-21 NOTE — Assessment & Plan Note (Signed)
Likely multifactorial and could be due to years of heavy alcohol consumption, however, late neurosyphilis remains on the differential.  PLAN: . LP to rule out/rule in neurosyphilis . HIV screen was previously negative

## 2020-09-21 NOTE — Progress Notes (Signed)
[facility] for Infectious Disease  Reason for Consult: Syphilis  Referring Provider: Dr Chevis Pretty   HPI:    Daniel Griffin is a 60 y.o. male with PMHx as below who presents to the clinic for further evaluation of positive RPR.Marland Kitchen   Patient has no significant past medical history of rheumatoid arthritis and alcohol use disorder who was recently evaluated in the neurology clinic for episode of transient altered levels of consciousness that occurred in November 2021.  He had an initial CT scan of the head which was unremarkable followed by MRI of the brain on June 22, 2020 which showed changes of small vessel disease but no acute abnormality.  EEG done November 3 was normal.  He was seen in consultation by neurology during the admission who thought this episode could be related to alcohol and unwitnessed seizure with postictal confusion.  At his follow-up visit with neurology on January 25 he had not had any recurrent episodes of seizures or altered consciousness.  A CTA head and neck is ordered and pending.  He denies any headaches or other visual changes/hearing changes.  No other focal neurologic deficits.  As part of this work-up, patient had labs drawn for dementia panel including a serum RPR which was reactive at a titer of 1: 1.  Patient reports no prior history or treatment for syphilis.  He is accompanied by his daughter today.  He reiterates no known prior syphilis treatment or infection.  She reports there is still concern regarding ongoing memory issues despite him significantly cutting back on alcohol intake.  There is no recollection of a primary syphilis ulcer or rash.   Patient's Medications  New Prescriptions   No medications on file  Previous Medications   ABATACEPT (ORENCIA CLICKJECT) 0000000 MG/ML SOAJ    Inject 125 mg into the skin once a week.   ASPIRIN EC 81 MG TABLET    Take 1 tablet (81 mg total) by mouth daily. Swallow whole.   CELECOXIB (CELEBREX)  200 MG CAPSULE    Take 1 capsule (200 mg total) by mouth 2 (two) times daily.  Modified Medications   No medications on file  Discontinued Medications   PREDNISONE (DELTASONE) 5 MG TABLET    Take 4 tabs po x 4 days, 3  tabs po x 4 days, 2  tabs po x 4 days, 1  tab po x 4 days      Past Medical History:  Diagnosis Date  . Arthritis   . Seizures ([place]) 06/21/2020   ETOH w/d related    Social History   Tobacco Use  . Smoking status: Never Smoker  . Smokeless tobacco: Current User    Types: Snuff, Chew  Vaping Use  . Vaping Use: Never used  Substance Use Topics  . Alcohol use: Not Currently    Comment: Quit in 06/2020  . Drug use: Yes    Frequency: 2.0 times per week    Types: Marijuana    Family History  Problem Relation Age of Onset  . Alzheimer's disease Father   . Healthy Son   . Healthy Daughter   . Rheum arthritis Daughter   . Healthy Daughter     No Known Allergies  Review of Systems  Constitutional: Negative for chills and fever.  HENT: Negative.   Eyes: Negative.   Genitourinary:       No ulcer  Skin: Negative for rash.  Neurological: Negative for focal weakness and headaches.  Psychiatric/Behavioral:  Positive for memory loss.      OBJECTIVE:    Vitals:   09/21/20 1357  BP: (!) 147/70  Pulse: (!) 58  Temp: 98.7 F (37.1 C)  TempSrc: Oral  SpO2: 99%  Weight: 203 lb (92.1 kg)  Height: 5\' 6"  (1.676 m)     Body mass index is 32.77 kg/m.  Physical Exam Constitutional:      General: He is not in acute distress.    Appearance: Normal appearance.  Cardiovascular:     Rate and Rhythm: Normal rate and regular rhythm.  Pulmonary:     Effort: Pulmonary effort is normal. No respiratory distress.  Neurological:     General: No focal deficit present.     Mental Status: He is alert and oriented to person, place, and time.  Psychiatric:        Mood and Affect: Mood normal.        Behavior: Behavior normal.      Labs and Microbiology:  CBC  Latest Ref Rng & Units 06/28/2020 06/21/2020 06/21/2020  WBC 3.4 - 10.8 x10E3/uL 5.8 - 6.2  Hemoglobin 13.0 - 17.7 g/dL 13.7 13.3 13.8  Hematocrit 37.5 - 51.0 % 38.3 39.0 40.5  Platelets 150 - 450 x10E3/uL 219 - 181   CMP Latest Ref Rng & Units 06/28/2020 06/22/2020 06/21/2020  Glucose 65 - 99 mg/dL 99 112(H) 120(H)  BUN 6 - 24 mg/dL 11 8 7   Creatinine 0.76 - 1.27 mg/dL 0.98 0.79 0.80  Sodium 134 - 144 mmol/L 138 133(L) 140  Potassium 3.5 - 5.2 mmol/L 4.6 3.9 3.5  Chloride 96 - 106 mmol/L 101 103 102  CO2 20 - 29 mmol/L 25 23 -  Calcium 8.7 - 10.2 mg/dL 8.9 8.3(L) -  Total Protein 6.0 - 8.5 g/dL 6.8 - -  Total Bilirubin 0.0 - 1.2 mg/dL 0.6 - -  Alkaline Phos 44 - 121 IU/L 61 - -  AST 0 - 40 IU/L 21 - -  ALT 0 - 44 IU/L 27 - -     No results found for this or any previous visit (from the past 240 hour(s)).  Imaging: IMPRESSION: No acute intracranial process.  Mild cerebral atrophy and chronic microvascular ischemic changes.   ASSESSMENT & PLAN:    Syphilis Patient presenting with RPR titer of 1:1 and no known prior syphilis treatment.  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  However, memory issues and forgetfulness raises concern for neurosyphilis.  PLAN: . Plan for lumbar puncture with CSF cell count, protein, glucose, and VDRL . Further plan depending on results of LP . Will check baseline CBC, BMP today and urine GC/CT . Follow up in 4 weeks   Dementia without behavioral disturbance (HCC) Likely multifactorial and could be due to years of heavy alcohol consumption, however, late neurosyphilis remains on the differential.  PLAN: . LP to rule out/rule in neurosyphilis . HIV screen was previously negative    Orders Placed This Encounter  Procedures  . DG FL GUIDED LUMBAR PUNCTURE    Standing Status:   Future    Standing Expiration Date:   09/21/2021    Order Specific Question:   Lab orders requested (DO NOT place separate lab orders, these will be automatically  ordered during procedure specimen collection):    Answer:   CSF cell count w differential    Order Specific Question:   Lab orders requested (DO NOT place separate lab orders, these will be automatically ordered during procedure specimen collection):    Answer:  Protein and glucose, CSF    Order Specific Question:   Lab orders requested (DO NOT place separate lab orders, these will be automatically ordered during procedure specimen collection):    Answer:   VDRL, CSF    Order Specific Question:   Reason for Exam (SYMPTOM  OR DIAGNOSIS REQUIRED)    Answer:   syphilis    Order Specific Question:   Preferred Imaging Location?    Answer:   [facility]  . CBC  . Basic metabolic panel    Order Specific Question:   Has the patient fasted?    Answer:   No        Raynelle Highland for Infectious Disease [facility] 09/21/2020, 2:33 PM

## 2020-09-21 NOTE — Assessment & Plan Note (Addendum)
Patient presenting with RPR titer of 1:1 and no known prior syphilis treatment.  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  However, memory issues and forgetfulness raises concern for neurosyphilis.  PLAN: . Plan for lumbar puncture with CSF cell count, protein, glucose, and VDRL . Further plan depending on results of LP . Will check baseline CBC, BMP today and urine GC/CT . Follow up in 4 weeks

## 2020-09-23 ENCOUNTER — Telehealth: Payer: Self-pay

## 2020-09-23 NOTE — Telephone Encounter (Signed)
-----   Message from Mignon Pine, DO sent at 09/23/2020  9:37 AM EST ----- Can you let pt know that his labs were normal and I will follow up results of his LP on 2/14 to determine treatment plan?  Thanks Mitzi Hansen

## 2020-09-23 NOTE — Telephone Encounter (Signed)
Attempted to call patient to review lab results with him. Individual who was not the patient answered and stated the patient was not there at the moment. Called number listed as home number with no answer. Will try again later.   Arrin Ishler Lorita Officer, RN

## 2020-09-24 NOTE — Telephone Encounter (Signed)
Patient made aware.

## 2020-09-28 NOTE — Progress Notes (Signed)
Kindly inform the patient that EEG study was normal

## 2020-09-29 ENCOUNTER — Telehealth: Payer: Self-pay | Admitting: *Deleted

## 2020-09-29 NOTE — Telephone Encounter (Signed)
Called pt's daughter Angela Nevin (on [place]) and LVM (ok per DPR) advising of pt's normal EEG results, no seizure activity noted on EEG. Left office number for call back if she has any questions. Also advised we would call when results are received for upcoming CT angio head and neck scheduled for 2/22.

## 2020-10-04 ENCOUNTER — Other Ambulatory Visit: Payer: Self-pay

## 2020-10-04 ENCOUNTER — Ambulatory Visit (HOSPITAL_COMMUNITY)
Admission: RE | Admit: 2020-10-04 | Discharge: 2020-10-04 | Disposition: A | Discharge status: Home / Self Care | Payer: BC Managed Care – PPO | Source: Ambulatory Visit | Attending: Internal Medicine | Admitting: Internal Medicine

## 2020-10-04 DIAGNOSIS — A539 Syphilis, unspecified: Secondary | ICD-10-CM

## 2020-10-04 DIAGNOSIS — F0390 Unspecified dementia without behavioral disturbance: Secondary | ICD-10-CM | POA: Insufficient documentation

## 2020-10-04 LAB — CSF CELL COUNT WITH DIFFERENTIAL
RBC Count, CSF: 0 /mm3
Tube #: 3
WBC, CSF: 1 /mm3 (ref 0–5)

## 2020-10-04 LAB — GLUCOSE, CSF: Glucose, CSF: 68 mg/dL (ref 40–70)

## 2020-10-04 LAB — PROTEIN, CSF: Total  Protein, CSF: 33 mg/dL (ref 15–45)

## 2020-10-04 MED ORDER — LIDOCAINE HCL (PF) 1 % IJ SOLN
5.0000 mL | Freq: Once | INTRAMUSCULAR | Status: AC
  Administered 2020-10-04: 5 mL via INTRADERMAL

## 2020-10-04 NOTE — Discharge Instructions (Signed)
Lumbar Puncture, Care After This sheet gives you information about how to care for yourself after your procedure. Your health care provider may also give you more specific instructions. If you have problems or questions, contact your health care provider. What can I expect after the procedure? After the procedure, it is common to have:  Mild discomfort or pain at the puncture site.  A mild headache that is relieved with pain medicines. Follow these instructions at home: Activity  Lie down flat or rest for as long as directed by your health care provider.  Return to your normal activities as told by your health care provider. Ask your health care provider what activities are safe for you.  Avoid lifting anything heavier than 10 lb (4.5 kg) for at least 12 hours after the procedure.  Do not drive for 24 hours if you were given a medicine to help you relax (sedative) during your procedure.  Do not drive or use heavy machinery while taking prescription pain medicine.   Puncture site care  Remove or change your bandage (dressing) as told by your health care provider. Remove in 24 hours  Check your puncture area every day for signs of infection. Check for: ? More pain. ? Redness or swelling. ? Fluid or blood leaking from the puncture site. ? Warmth. ? Pus or a bad smell. General instructions  Take over-the-counter and prescription medicines only as told by your health care provider.  Drink enough fluids to keep your urine clear or pale yellow. Your health care provider may recommend drinking caffeine to prevent a headache.  Keep all follow-up visits as told by your health care provider. This is important. Contact a health care provider if:  You have fever or chills.  You have nausea or vomiting.  You have a headache that lasts for more than 2 days or does not get better with medicine. Get help right away if:  You develop any of the following in your  legs: ? Weakness. ? Numbness. ? Tingling.  You are unable to control when you urinate or have a bowel movement (incontinence).  You have signs of infection around your puncture site, such as: ? More pain. ? Redness or swelling. ? Fluid or blood leakage. ? Warmth. ? Pus or a bad smell.  You are dizzy or you feel like you might faint.  You have a severe headache, especially when you sit or stand. Summary  A lumbar puncture is a procedure in which a small needle is inserted into the lower back to remove fluid that surrounds the brain and spinal cord.  After this procedure, it is common to have a headache and pain around the needle insertion area.  Lying flat, staying hydrated, and drinking caffeine can help prevent headaches.  Monitor your needle insertion site for signs of infection, including warmth, fluid, or more pain.  Get help right away if you develop leg weakness, leg numbness, incontinence, or severe headaches. This information is not intended to replace advice given to you by your health care provider. Make sure you discuss any questions you have with your health care provider. Document Revised: 06/17/2020 Document Reviewed: 06/17/2020 Elsevier Patient Education  2021 [employer]vier Inc.  

## 2020-10-05 LAB — VDRL, CSF: VDRL Quant, CSF: NONREACTIVE

## 2020-10-06 ENCOUNTER — Telehealth: Payer: Self-pay

## 2020-10-06 NOTE — Telephone Encounter (Signed)
Will you be signing orders to fax over to their office?

## 2020-10-06 NOTE — Telephone Encounter (Signed)
I have tried to contact patient's daughter Florentina Jenny to let her know Dr. Juleen China is ok with patient getting Bicillin injections at his pcp office. Florentina Jenny did not answer and I have left a voicemail for her to call back. Kristen Fromm T Brooks Sailors

## 2020-10-06 NOTE — Telephone Encounter (Signed)
If they need orders faxed over we can, but I won't be in the office until Friday.  Does their PCP office need orders for that or can they just give Bicillin (they are on Epic so have access to our notes)? If so, maybe someone in the clinic can sign and fax orders?

## 2020-10-06 NOTE — Telephone Encounter (Signed)
I spoke with the patient's daughter Florentina Jenny and advised her patient's LP results.  Patient's daughter informed that patient will need to be treated for late latent syphilis with Bicillin once per week x 3 weeks. Patient's daughter verbalized understanding. Florentina Jenny is requesting that patient get Bicillin injections at his pcp office, which is [place] where the patient's daughter Florentina Jenny is an LPN.  Patient's daughter states this was discussed during the patient's office visit and that his pcp office will need orders if this is ok.  Please advise. Teya Otterson T Brooks Sailors

## 2020-10-06 NOTE — Telephone Encounter (Signed)
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 He'll need to be scheduled for the Bicillin injections.  Thanks! Mitzi Hansen

## 2020-10-06 NOTE — Telephone Encounter (Signed)
Yes, I have no problem with him going to his PCP office for Bicillin injections.

## 2020-10-07 LAB — CSF CULTURE W GRAM STAIN
Culture: NO GROWTH
Gram Stain: NONE SEEN

## 2020-10-07 MED FILL — ORENCIA CLICKJECT 125 MG/ML: 125 | 28 days supply | Qty: 4 | Fill #1

## 2020-10-08 NOTE — Telephone Encounter (Signed)
I have spoke with patient's daughter Florentina Jenny and advised her that Dr. Juleen China is ok with the patient getting bicillin injections x 3 at his pcp office.  I have faxed orders to his pcp office that was provide by the daughter and  Dr. Juleen China has signed orders. Florentina Jenny verbalized understanding. Cecilia Vancleve T Brooks Sailors

## 2020-10-12 ENCOUNTER — Ambulatory Visit (HOSPITAL_COMMUNITY)
Admission: RE | Admit: 2020-10-12 | Discharge: 2020-10-12 | Disposition: A | Discharge status: Home / Self Care | Payer: BC Managed Care – PPO | Source: Ambulatory Visit | Attending: Neurology | Admitting: Neurology

## 2020-10-12 DIAGNOSIS — I671 Cerebral aneurysm, nonruptured: Secondary | ICD-10-CM | POA: Diagnosis not present

## 2020-10-12 DIAGNOSIS — G459 Transient cerebral ischemic attack, unspecified: Secondary | ICD-10-CM | POA: Insufficient documentation

## 2020-10-12 MED ORDER — IOHEXOL 350 MG/ML SOLN
100.0000 mL | Freq: Once | INTRAVENOUS | Status: AC | PRN
  Administered 2020-10-12: 100 mL via INTRAVENOUS

## 2020-10-13 ENCOUNTER — Ambulatory Visit (INDEPENDENT_AMBULATORY_CARE_PROVIDER_SITE_OTHER): Payer: BC Managed Care – PPO | Admitting: *Deleted

## 2020-10-13 ENCOUNTER — Other Ambulatory Visit: Payer: Self-pay

## 2020-10-13 DIAGNOSIS — A530 Latent syphilis, unspecified as early or late: Secondary | ICD-10-CM

## 2020-10-13 DIAGNOSIS — Z8619 Personal history of other infectious and parasitic diseases: Secondary | ICD-10-CM

## 2020-10-13 MED ORDER — PENICILLIN G BENZATHINE 1200000 UNIT/2ML IM SUSP
2.4000 10*6.[IU] | INTRAMUSCULAR | Status: AC
  Administered 2020-10-13 – 2020-10-27 (×3): 2.4 10*6.[IU] via INTRAMUSCULAR

## 2020-10-19 ENCOUNTER — Ambulatory Visit (INDEPENDENT_AMBULATORY_CARE_PROVIDER_SITE_OTHER): Payer: BC Managed Care – PPO | Admitting: Internal Medicine

## 2020-10-19 ENCOUNTER — Other Ambulatory Visit: Payer: Self-pay

## 2020-10-19 ENCOUNTER — Encounter: Payer: Self-pay | Admitting: Internal Medicine

## 2020-10-19 DIAGNOSIS — A539 Syphilis, unspecified: Secondary | ICD-10-CM

## 2020-10-19 NOTE — Progress Notes (Signed)
[facility]  CHIEF COMPLAINT:    Follow up for syphilis  SUBJECTIVE:    Daniel Griffin is a 60 y.o. male with PMHx as below who presents to the clinic for follow up syphilis.   I saw patient as a new consult on 09/21/2020 for evaluation of positive syphilis screen with RPR titer of 1:1.  Patient underwent LP to ensure no findings of neurosyphilis and this was unremarkable.  He was advised to receive Bicillin 2.4 million units weekly x 3 doses which he has done through his PCP office with first injection last week.  Second dose tomorrow morning.    Please see A&P for the details of today's visit and status of the patient's medical problems.   Patient's Medications  New Prescriptions   No medications on file  Previous Medications   ABATACEPT (ORENCIA CLICKJECT) 235 MG/ML SOAJ    Inject 125 mg into the skin once a week.   ASPIRIN EC 81 MG TABLET    Take 1 tablet (81 mg total) by mouth daily. Swallow whole.   CELECOXIB (CELEBREX) 200 MG CAPSULE    Take 1 capsule (200 mg total) by mouth 2 (two) times daily.  Modified Medications   No medications on file  Discontinued Medications   No medications on file      Past Medical History:  Diagnosis Date  . Arthritis   . Seizures ([place]) 06/21/2020   ETOH w/d related    Social History   Tobacco Use  . Smoking status: Never Smoker  . Smokeless tobacco: Current User    Types: Snuff, Chew  Vaping Use  . Vaping Use: Never used  Substance Use Topics  . Alcohol use: Not Currently    Comment: Quit in 06/2020  . Drug use: Yes    Frequency: 2.0 times per week    Types: Marijuana    Family History  Problem Relation Age of Onset  . Alzheimer's disease Father   . Healthy Son   . Healthy Daughter   . Rheum arthritis Daughter   . Healthy Daughter     No Known Allergies  Review of Systems  Constitutional: Negative for fever.  Respiratory: Negative.   Cardiovascular: Negative.    Gastrointestinal: Negative.      OBJECTIVE:    Vitals:   10/19/20 1420  Weight: 189 lb (85.7 kg)   Body mass index is 30.51 kg/m.  Physical Exam Constitutional:      General: He is not in acute distress.    Appearance: Normal appearance.  Neurological:     General: No focal deficit present.     Mental Status: He is alert.  Psychiatric:        Mood and Affect: Mood normal.        Behavior: Behavior normal.      Labs and Microbiology: CBC Latest Ref Rng & Units 09/21/2020 06/28/2020 06/21/2020  WBC 3.8 - 10.8 Thousand/uL 8.4 5.8 -  Hemoglobin 13.2 - 17.1 g/dL 13.5 13.7 13.3  Hematocrit 38.5 - 50.0 % 40.0 38.3 39.0  Platelets 140 - 400 Thousand/uL 218 219 -   CMP Latest Ref Rng & Units 09/21/2020 06/28/2020 06/22/2020  Glucose 65 - 99 mg/dL 69 99 112(H)  BUN 7 - 25 mg/dL 15 11 8   Creatinine 0.70 - 1.25 mg/dL 0.82 0.98 0.79  Sodium 135 - 146 mmol/L 140 138 133(L)  Potassium 3.5 - 5.3 mmol/L 4.3 4.6 3.9  Chloride 98 - 110  mmol/L 103 101 103  CO2 20 - 32 mmol/L 30 25 23   Calcium 8.6 - 10.3 mg/dL 9.0 8.9 8.3(L)  Total Protein 6.0 - 8.5 g/dL - 6.8 -  Total Bilirubin 0.0 - 1.2 mg/dL - 0.6 -  Alkaline Phos 44 - 121 IU/L - 61 -  AST 0 - 40 IU/L - 21 -  ALT 0 - 44 IU/L - 27 -     No results found for this or any previous visit (from the past 240 hour(s)).    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  Completing weekly Bicillin injections through his PCP x 3 doses.  Can follow up with [place] as needed.       [facility] 10/19/2020, 2:31 PM

## 2020-10-19 NOTE — Assessment & Plan Note (Signed)
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  Completing weekly Bicillin injections through his PCP x 3 doses.  Can follow up with [place] as needed.

## 2020-10-19 NOTE — Patient Instructions (Signed)
Thank you for coming to see me today. It was a pleasure seeing you.  To Do:  Continue with weekly Bicillin injections with your PCP for 3 total shots  Follow up with me as needed  If you have any questions or concerns, please do not hesitate to call the office at [phone].  Take Care,   Jule Ser, DO

## 2020-10-20 ENCOUNTER — Ambulatory Visit (INDEPENDENT_AMBULATORY_CARE_PROVIDER_SITE_OTHER): Payer: BC Managed Care – PPO

## 2020-10-20 DIAGNOSIS — A530 Latent syphilis, unspecified as early or late: Secondary | ICD-10-CM

## 2020-10-26 ENCOUNTER — Telehealth: Payer: Self-pay | Admitting: *Deleted

## 2020-10-26 NOTE — Telephone Encounter (Signed)
LVM and informed the patient that CT angiogram study of the brain and neck showed no major blockages to worry about. 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 This is likely not related to his symptoms and can be followed conservatively. Dr Leonie Man can discuss this more at follow-up visit. Left # for questions.

## 2020-10-26 NOTE — Progress Notes (Signed)
Kindly inform the patient that CT angiogram study of the brain and neck no any major blockages to worry about.  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  This is likely not related to his symptoms and can be followed conservatively and I can discuss this more at follow-up visit.

## 2020-10-27 ENCOUNTER — Other Ambulatory Visit: Payer: Self-pay

## 2020-10-27 ENCOUNTER — Ambulatory Visit (INDEPENDENT_AMBULATORY_CARE_PROVIDER_SITE_OTHER): Payer: BC Managed Care – PPO

## 2020-10-27 DIAGNOSIS — A530 Latent syphilis, unspecified as early or late: Secondary | ICD-10-CM | POA: Diagnosis not present

## 2020-10-27 DIAGNOSIS — Z8619 Personal history of other infectious and parasitic diseases: Secondary | ICD-10-CM

## 2020-11-02 ENCOUNTER — Other Ambulatory Visit: Payer: Self-pay | Admitting: Nurse Practitioner

## 2020-11-02 DIAGNOSIS — M0520 Rheumatoid vasculitis with rheumatoid arthritis of unspecified site: Secondary | ICD-10-CM

## 2020-11-02 NOTE — Progress Notes (Signed)
Office Visit Note  Patient: Daniel Griffin             Date of Birth: Dec 05, 1960           MRN: [id number]             PCP: Chevis Pretty, FNP Referring: No ref. provider found Visit Date: 11/16/2020 Occupation: @GUAROCC @  Subjective:  Pain in multiple joints.   History of Present Illness: Daniel Griffin is a 60 y.o. male with history of sero positive rheumatoid arthritis and osteoarthritis.  He states he continues to have pain and discomfort in his left shoulder and bilateral knee joints.  He was seen by orthopedics at [facility].  I had his sister on the phone who told me that he had bilateral knee joint cortisone injections in mid January.  He also had left shoulder joint injection.  He has a follow-up appointment in April to get repeat injections or Visco supplement injections.  He states he continues to have discomfort in his joints but no joint swelling.  He has been taking Orencia regular basis.  On chart review I found out that he was evaluated for latent syphilis and neurosyphilis.  Work-up for neurosyphilis was negative.  He was treated with insulin for possible latent syphilis.  Activities of Daily Living:  Patient reports morning stiffness for 24 hours.   Patient Denies nocturnal pain.  Difficulty dressing/grooming: Reports Difficulty climbing stairs: Reports Difficulty getting out of chair: Reports Difficulty using hands for taps, buttons, cutlery, and/or writing: Reports  Review of Systems  Constitutional: Negative for fatigue and night sweats.  HENT: Negative for mouth sores, mouth dryness and nose dryness.   Eyes: Negative for redness and dryness.  Respiratory: Negative for shortness of breath and difficulty breathing.   Cardiovascular: Negative for chest pain, palpitations, hypertension, irregular heartbeat and swelling in legs/feet.  Gastrointestinal: Negative for constipation and diarrhea.  Endocrine: Negative for excessive thirst and increased  urination.  Genitourinary: Negative for difficulty urinating.  Musculoskeletal: Positive for arthralgias, gait problem, joint pain and morning stiffness. Negative for joint swelling, myalgias, muscle weakness, muscle tenderness and myalgias.  Skin: Negative for color change, rash, hair loss, nodules/bumps, skin tightness, ulcers and sensitivity to sunlight.  Allergic/Immunologic: Negative for susceptible to infections.  Neurological: Positive for memory loss and weakness. Negative for dizziness, fainting and night sweats.  Hematological: Positive for bruising/bleeding tendency. Negative for swollen glands.  Psychiatric/Behavioral: Positive for sleep disturbance. Negative for depressed mood. The patient is not nervous/anxious.     PMFS History:  Patient Active Problem List   Diagnosis Date Noted  . Syphilis 09/21/2020  . Dementia without behavioral disturbance ([place]) 09/21/2020  . 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 Pain in left knee 04/16/2019  . Leukopenia 05/22/2018  . Rheumatoid arthritis involving multiple sites with positive rheumatoid factor ([place]) 11/30/2016  . High risk medications (not anticoagulants) long-term use 11/30/2016  . Pain in both hands 11/30/2016  . Pain in joints of both feet 11/30/2016    Past Medical History:  Diagnosis Date  . Arthritis   . Seizures ([place]) 06/21/2020   ETOH w/d related    Family History  Problem Relation Age of Onset  . Alzheimer's disease Father   . Healthy Son   . Healthy Daughter   . Rheum arthritis Daughter   . Healthy Daughter    Past Surgical History:  Procedure Laterality Date  . ANKLE FRACTURE SURGERY     Left  . APPENDECTOMY  Social History   Social History Narrative   Lives alone   Right Handed   Rarely drinks caffeine       Immunization History  Administered Date(s) Administered  . Influenza,inj,Quad PF,6+ Mos 07/04/2018     Objective: Vital Signs: BP 117/62 (BP Location: Left Arm, Patient Position:  Sitting, Cuff Size: Normal)   Pulse 68   Resp 16   Ht 5\' 7"  (1.702 m)   Wt 191 lb (86.6 kg)   BMI 29.91 kg/m    Physical Exam Vitals and nursing note reviewed.  Constitutional:      Appearance: He is well-developed.  HENT:     Head: Normocephalic and atraumatic.  Eyes:     Conjunctiva/sclera: Conjunctivae normal.     Pupils: Pupils are equal, round, and reactive to light.  Cardiovascular:     Rate and Rhythm: Normal rate and regular rhythm.     Heart sounds: Normal heart sounds.  Pulmonary:     Effort: Pulmonary effort is normal.     Breath sounds: Normal breath sounds.  Abdominal:     General: Bowel sounds are normal.     Palpations: Abdomen is soft.  Musculoskeletal:     Cervical back: Normal range of motion and neck supple.  Skin:    General: Skin is warm and dry.     Capillary Refill: Capillary refill takes less than 2 seconds.  Neurological:     Mental Status: He is alert and oriented to person, place, and time.  Psychiatric:        Behavior: Behavior normal.      Musculoskeletal Exam: The spine was in good range of motion.  Left shoulder joint had limited abduction about 110 degrees.  He degree contracture in his right elbow joint.  Wrist joints in good range of motion.  Synovial thickening was noted over MCP joints with no synovitis.  PIP and DIP prominence was noted.  Hip joints and knee joints in good range of motion.  He had discomfort range of motion of his knee joints without any warmth swelling effusion.  There was no tenderness over ankles or MTPs.  CDAI Exam: CDAI Score: 3.7  Patient Global: 5 mm; Provider Global: 2 mm Swollen: 0 ; Tender: 3  Joint Exam 11/16/2020      Right  Left  Glenohumeral      Tender  Knee   Tender   Tender     Investigation: No additional findings.  Imaging: No results found.  Recent Labs: Lab Results  Component Value Date   WBC 8.4 09/21/2020   HGB 13.5 09/21/2020   PLT 218 09/21/2020   NA 140 09/21/2020   K 4.3  09/21/2020   CL 103 09/21/2020   CO2 30 09/21/2020   GLUCOSE 69 09/21/2020   BUN 15 09/21/2020   CREATININE 0.82 09/21/2020   BILITOT 0.6 06/28/2020   ALKPHOS 61 06/28/2020   AST 21 06/28/2020   ALT 27 06/28/2020   PROT 6.8 06/28/2020   ALBUMIN 4.1 06/28/2020   CALCIUM 9.0 09/21/2020   GFRAA 97 06/28/2020   QFTBGOLD NEGATIVE 07/03/2017   QFTBGOLDPLUS NEGATIVE 08/27/2020    Speciality Comments: Prior therapy includes: Enbrel (pancytopenia) and methotrexate (inadequate response) Orencia-07/09/18 Enbrel-1/19-10/19  Procedures:  No procedures performed Allergies: Patient has no known allergies.   Assessment / Plan:     Visit Diagnoses: Rheumatoid arthritis involving multiple sites with positive rheumatoid factor (HCC) - methotrexate was discontinued in the past due to neutropenia.  He did well on  Enbrel but it was also discontinued due to neutropenia.  He is currently on Orencia subcu which has been tolerating well.  He had no synovitis on my examination.  High risk medications (not anticoagulants) long-term use - Orencia sq 1q week - Plan: ALT, AST.  Labs from February 2022 CBC was normal, BMP was normal.  Chronic pain of both shoulders-he continues to have some discomfort in his shoulder joints.  Left shoulder joint abduction was limited.  He has been seen by orthopedics at [facility] now.  Primary osteoarthritis of both hands-he has rheumatoid arthritis and osteoarthritis overlap.  Chronic pain of both knees - X-ray bilateral knee joint showed severe osteoarthritis and severe chondromalacia patella.  Left knee joint is worse in the right knee joint.  He requests cortisone injection to his knee joints.  I spoke with his sister on the phone as well.  He has an appointment coming up with orthopedics for repeat knee joint injections most likely Visco supplement injections.  His sister mentioned that he may need total knee replacement in the future but not immediate future.  Primary  osteoarthritis of both feet-chronic pain.  Memory loss -has been experiencing memory loss.  He had recent work-up.  I reviewed the chart.  MRI of brain November 2022 showed small vessel disease.  Latent syphilis - RPR positive.  CSF analysis negative.  Treated with penicillin.  I am uncertain if the latent syphilis diagnosis was confirmed.  I have sent a message to Dr. Juleen China to confirm the diagnosis.  I would also like to know if he can continue Orencia.  History of alcohol abuse  Orders: Orders Placed This Encounter  Procedures  . ALT  . AST   No orders of the defined types were placed in this encounter.     Follow-Up Instructions: Return in about 5 months (around 04/18/2021) for Rheumatoid arthritis.   Bo Merino, MD  Note - This record has been created using Editor, commissioning.  Chart creation errors have been sought, but may not always  have been located. Such creation errors do not reflect on  the standard of medical care.

## 2020-11-08 MED FILL — ORENCIA CLICKJECT 125 MG/ML: 125 | 28 days supply | Qty: 4 | Fill #2

## 2020-11-16 ENCOUNTER — Encounter: Payer: Self-pay | Admitting: Rheumatology

## 2020-11-16 ENCOUNTER — Other Ambulatory Visit (HOSPITAL_COMMUNITY): Payer: Self-pay

## 2020-11-16 ENCOUNTER — Other Ambulatory Visit: Payer: Self-pay

## 2020-11-16 ENCOUNTER — Ambulatory Visit (INDEPENDENT_AMBULATORY_CARE_PROVIDER_SITE_OTHER): Payer: BC Managed Care – PPO | Admitting: Rheumatology

## 2020-11-16 VITALS — BP 117/62 | HR 68 | Resp 16 | Ht 67.0 in | Wt 191.0 lb

## 2020-11-16 DIAGNOSIS — M791 Myalgia, unspecified site: Secondary | ICD-10-CM

## 2020-11-16 DIAGNOSIS — M25511 Pain in right shoulder: Secondary | ICD-10-CM

## 2020-11-16 DIAGNOSIS — M0579 Rheumatoid arthritis with rheumatoid factor of multiple sites without organ or systems involvement: Secondary | ICD-10-CM

## 2020-11-16 DIAGNOSIS — R413 Other amnesia: Secondary | ICD-10-CM

## 2020-11-16 DIAGNOSIS — Z79899 Other long term (current) drug therapy: Secondary | ICD-10-CM

## 2020-11-16 DIAGNOSIS — G8929 Other chronic pain: Secondary | ICD-10-CM

## 2020-11-16 DIAGNOSIS — M19072 Primary osteoarthritis, left ankle and foot: Secondary | ICD-10-CM

## 2020-11-16 DIAGNOSIS — M19041 Primary osteoarthritis, right hand: Secondary | ICD-10-CM | POA: Diagnosis not present

## 2020-11-16 DIAGNOSIS — M19071 Primary osteoarthritis, right ankle and foot: Secondary | ICD-10-CM

## 2020-11-16 DIAGNOSIS — A530 Latent syphilis, unspecified as early or late: Secondary | ICD-10-CM

## 2020-11-16 DIAGNOSIS — M19042 Primary osteoarthritis, left hand: Secondary | ICD-10-CM

## 2020-11-16 DIAGNOSIS — Z862 Personal history of diseases of the blood and blood-forming organs and certain disorders involving the immune mechanism: Secondary | ICD-10-CM

## 2020-11-16 DIAGNOSIS — M25561 Pain in right knee: Secondary | ICD-10-CM

## 2020-11-16 DIAGNOSIS — F1011 Alcohol abuse, in remission: Secondary | ICD-10-CM

## 2020-11-16 DIAGNOSIS — M25562 Pain in left knee: Secondary | ICD-10-CM

## 2020-11-16 DIAGNOSIS — M25512 Pain in left shoulder: Secondary | ICD-10-CM

## 2020-11-16 LAB — AST: AST: 15 U/L (ref 10–35)

## 2020-11-16 LAB — ALT: ALT: 13 U/L (ref 9–46)

## 2020-11-16 NOTE — Patient Instructions (Signed)
Standing Labs We placed an order today for your standing lab work.   Please have your standing labs drawn in May and every 3 months  If possible, please have your labs drawn 2 weeks prior to your appointment so that the provider can discuss your results at your appointment.  We have open lab daily Monday through Thursday from 1:30-4:30 PM and Friday from 1:30-4:00 PM at the office of Dr. Marjoria Mancillas, [facility].   Please be advised, all patients with office appointments requiring lab work will take precedents over walk-in lab work.  If possible, please come for your lab work on Monday and Friday afternoons, as you may experience shorter wait times. The office is located at [address], [address], [place], [place] [address] No appointment is necessary.   Labs are drawn by [facility]. Please bring your co-pay at the time of your lab draw.  You may receive a bill from [facility] for your lab work.  If you wish to have your labs drawn at another location, please call the office 24 hours in advance to send orders.  If you have any questions regarding directions or hours of operation,  please call [phone].   As a reminder, please drink plenty of water prior to coming for your lab work. Thanks!   Vaccines You are taking a medication(s) that can suppress your immune system.  The following immunizations are recommended: . Flu annually . Covid-19  . Pneumonia (Pneumovax 23 and Prevnar 13 spaced at least 1 year apart) . Shingrix (after age 50)  Please check with your PCP to make sure you are up to date.  Heart Disease Prevention   Your inflammatory disease increases your risk of heart disease which includes heart attack, stroke, atrial fibrillation (irregular heartbeats), high blood pressure, heart failure and atherosclerosis (plaque in the arteries).  It is important to reduce your risk by:   . Keep blood pressure, cholesterol, and blood sugar at healthy levels    . Smoking Cessation   . Maintain a healthy weight  o BMI 20-25   . Eat a healthy diet  o Plenty of fresh fruit, vegetables, and whole grains  o Limit saturated fats, foods high in sodium, and added sugars  o DASH and Mediterranean diet   . Increase physical activity  o Recommend moderate physically activity for 150 minutes per week/ 30 minutes a day for five days a week These can be broken up into three separate ten-minute sessions during the day.   . Reduce Stress  . Meditation, slow breathing exercises, yoga, coloring books  . Dental visits twice a year    

## 2020-12-01 ENCOUNTER — Other Ambulatory Visit (HOSPITAL_COMMUNITY): Payer: Self-pay

## 2020-12-03 ENCOUNTER — Other Ambulatory Visit (HOSPITAL_COMMUNITY): Payer: Self-pay

## 2020-12-03 DIAGNOSIS — M25562 Pain in left knee: Secondary | ICD-10-CM | POA: Diagnosis not present

## 2020-12-03 DIAGNOSIS — M25561 Pain in right knee: Secondary | ICD-10-CM | POA: Diagnosis not present

## 2020-12-03 DIAGNOSIS — M25512 Pain in left shoulder: Secondary | ICD-10-CM | POA: Diagnosis not present

## 2020-12-06 ENCOUNTER — Other Ambulatory Visit (HOSPITAL_COMMUNITY): Payer: Self-pay

## 2020-12-07 ENCOUNTER — Other Ambulatory Visit (HOSPITAL_COMMUNITY): Payer: Self-pay

## 2020-12-07 ENCOUNTER — Other Ambulatory Visit: Payer: Self-pay | Admitting: Physician Assistant

## 2020-12-07 MED ORDER — ORENCIA CLICKJECT 125 MG/ML ~~LOC~~ SOAJ
125.0000 mg | SUBCUTANEOUS | 0 refills | Status: DC
  Filled 2020-12-07: qty 4, 28d supply, fill #0
  Filled 2021-01-03: qty 4, 28d supply, fill #1
  Filled 2021-02-02: qty 4, 28d supply, fill #2

## 2020-12-07 NOTE — Telephone Encounter (Signed)
Please call office to schedule appt, Thank you.  Return in about 5 months (around 04/18/2021) for Rheumatoid arthritis

## 2020-12-07 NOTE — Telephone Encounter (Signed)
Next Visit: Return in about 5 months (around 04/18/2021) for Rheumatoid arthritis  Last Visit: 11/16/2020  Last Fill: 06/18/2020   DX:  Rheumatoid arthritis involving multiple sites with positive rheumatoid factor   Current Dose per office note 11/16/2020, Orencia sq 1q week   Labs: 09/21/2020  TB Gold: 08/27/2020, negative  Okay to refill Orencia?

## 2020-12-08 ENCOUNTER — Other Ambulatory Visit (HOSPITAL_COMMUNITY): Payer: Self-pay

## 2020-12-14 NOTE — Telephone Encounter (Signed)
I LMOM for patient to call for a follow up appointment in August.

## 2020-12-23 ENCOUNTER — Ambulatory Visit: Payer: BC Managed Care – PPO | Admitting: Nurse Practitioner

## 2020-12-23 ENCOUNTER — Encounter: Payer: Self-pay | Admitting: Nurse Practitioner

## 2020-12-23 ENCOUNTER — Other Ambulatory Visit: Payer: Self-pay

## 2020-12-23 VITALS — BP 137/75 | HR 74 | Temp 98.7°F | Resp 20 | Ht 67.0 in | Wt 189.0 lb

## 2020-12-23 DIAGNOSIS — F0390 Unspecified dementia without behavioral disturbance: Secondary | ICD-10-CM

## 2020-12-23 DIAGNOSIS — R5383 Other fatigue: Secondary | ICD-10-CM | POA: Diagnosis not present

## 2020-12-23 DIAGNOSIS — R42 Dizziness and giddiness: Secondary | ICD-10-CM

## 2020-12-23 NOTE — Patient Instructions (Signed)
Vertigo Vertigo is the feeling that you or the things around you are moving when they are not. This feeling can come and go at any time. Vertigo often goes away on its own. This condition can be dangerous if it happens when you are doing activities like driving or working with machines. Your doctor will do tests to find the cause of your vertigo. These tests will also help your doctor decide on the best treatment for you. Follow these instructions at home: Eating and drinking  Drink enough fluid to keep your pee (urine) pale yellow.  Do not drink alcohol.      Activity  Return to your normal activities as told by your doctor. Ask your doctor what activities are safe for you.  In the morning, first sit up on the side of the bed. When you feel okay, stand slowly while you hold onto something until you know that your balance is fine.  Move slowly. Avoid sudden body or head movements or certain positions, as told by your doctor.  Use a cane if you have trouble standing or walking.  Sit down right away if you feel dizzy.  Avoid doing any tasks or activities that can cause danger to you or others if you get dizzy.  Avoid bending down if you feel dizzy. Place items in your home so that they are easy for you to reach without leaning over.  Do not drive or use heavy machinery if you feel dizzy. General instructions  Take over-the-counter and prescription medicines only as told by your doctor.  Keep all follow-up visits as told by your doctor. This is important. Contact a doctor if:  Your medicine does not help your vertigo.  You have a fever.  Your problems get worse or you have new symptoms.  Your family or friends see changes in your behavior.  The feeling of being sick to your stomach gets worse.  Your vomiting gets worse.  You lose feeling (have numbness) in part of your body.  You feel prickling and tingling in a part of your body. Get help right away if:  You have  trouble moving or talking.  You are always dizzy.  You pass out (faint).  You get very bad headaches.  You feel weak in your hands, arms, or legs.  You have changes in your hearing.  You have changes in how you see (vision).  You get a stiff neck.  Bright light starts to bother you. Summary  Vertigo is the feeling that you or the things around you are moving when they are not.  Your doctor will do tests to find the cause of your vertigo.  You may be told to avoid some tasks, positions, or movements.  Contact a doctor if your medicine is not helping, or if you have a fever, new symptoms, or a change in behavior.  Get help right away if you get very bad headaches, or if you have changes in how you speak, hear, or see. This information is not intended to replace advice given to you by your health care provider. Make sure you discuss any questions you have with your health care provider. Document Revised: 07/01/2018 Document Reviewed: 07/01/2018 Elsevier Patient Education  2021 [employer].

## 2020-12-23 NOTE — Progress Notes (Signed)
   Subjective:    Patient ID: Daniel Griffin, male    DOB: [birth year], 60 y.o.   MRN: [id number]   Chief Complaint: forms filled out  HPI Patient come sin today to have forms filled out for work. He has been out of work for several months due to episode of transient alteration in mental status. He was drinking alcohol a lot at the time.since then he was dx with syphillis and is under treatment. He has a form that needs to be filled out in order for him to not have to return to work.   Review of Systems  Constitutional: Negative.   Respiratory: Negative.   Cardiovascular: Negative.   Neurological: Positive for dizziness (intermittent).  Psychiatric/Behavioral: Positive for behavioral problems. The patient is nervous/anxious.        Memory problems   All other systems reviewed and are negative.      Objective:   Physical Exam Vitals and nursing note reviewed.  Constitutional:      Appearance: Normal appearance.  Cardiovascular:     Rate and Rhythm: Normal rate and regular rhythm.     Heart sounds: Normal heart sounds.  Pulmonary:     Breath sounds: Normal breath sounds.  Skin:    General: Skin is warm.  Neurological:     General: No focal deficit present.     Mental Status: He is alert and oriented to person, place, and time.  Psychiatric:        Mood and Affect: Mood normal.        Behavior: Behavior normal.     Comments: Looses concentration easily Answers all questions appropriately but slowly.     BP 137/75   Pulse 74   Temp 98.7 F (37.1 C) (Temporal)   Resp 20   Ht 5\' 7"  (1.702 m)   Wt 189 lb (85.7 kg)   SpO2 99%   BMI 29.60 kg/m        Assessment & Plan:  [name] in today with chief complaint of No chief complaint on file.   1. Dementia without behavioral disturbance, unspecified dementia type ([place]) Orient dialy Read daily  2. Vertigo Walk and stand with precautions   3. Decreased stamina Exercise some daily  Forms filled out  for work today- should not return to work.    The above assessment and management plan was discussed with the patient. The patient verbalized understanding of and has agreed to the management plan. Patient is aware to call the clinic if symptoms persist or worsen. Patient is aware when to return to the clinic for a follow-up visit. Patient educated on when it is appropriate to go to the emergency department.   Mary-Margaret Hassell Done, FNP

## 2020-12-24 ENCOUNTER — Ambulatory Visit: Payer: BC Managed Care – PPO | Admitting: Nurse Practitioner

## 2020-12-28 ENCOUNTER — Telehealth: Payer: Self-pay

## 2020-12-28 NOTE — Telephone Encounter (Signed)
Opened in error

## 2021-01-03 ENCOUNTER — Other Ambulatory Visit (HOSPITAL_COMMUNITY): Payer: Self-pay

## 2021-01-04 ENCOUNTER — Other Ambulatory Visit (HOSPITAL_COMMUNITY): Payer: Self-pay

## 2021-01-04 ENCOUNTER — Ambulatory Visit: Payer: BC Managed Care – PPO | Admitting: Neurology

## 2021-01-06 ENCOUNTER — Encounter: Payer: Self-pay | Admitting: Neurology

## 2021-01-06 ENCOUNTER — Ambulatory Visit (INDEPENDENT_AMBULATORY_CARE_PROVIDER_SITE_OTHER): Payer: BC Managed Care – PPO | Admitting: Neurology

## 2021-01-06 ENCOUNTER — Other Ambulatory Visit (HOSPITAL_COMMUNITY): Payer: Self-pay

## 2021-01-06 VITALS — BP 141/76 | HR 72 | Ht 67.0 in | Wt 187.4 lb

## 2021-01-06 DIAGNOSIS — F3289 Other specified depressive episodes: Secondary | ICD-10-CM

## 2021-01-06 DIAGNOSIS — R413 Other amnesia: Secondary | ICD-10-CM

## 2021-01-06 DIAGNOSIS — I670 Dissection of cerebral arteries, nonruptured: Secondary | ICD-10-CM

## 2021-01-06 DIAGNOSIS — I671 Cerebral aneurysm, nonruptured: Secondary | ICD-10-CM | POA: Diagnosis not present

## 2021-01-06 DIAGNOSIS — G3184 Mild cognitive impairment, so stated: Secondary | ICD-10-CM

## 2021-01-06 DIAGNOSIS — A530 Latent syphilis, unspecified as early or late: Secondary | ICD-10-CM

## 2021-01-06 MED ORDER — BUPROPION HCL ER (XL) 150 MG PO TB24
150.0000 mg | ORAL_TABLET | Freq: Every day | ORAL | 2 refills | Status: DC
Start: 2021-01-06 — End: 2021-04-21
  Filled 2021-01-06: qty 30, 30d supply, fill #0
  Filled 2021-02-07: qty 30, 30d supply, fill #1
  Filled 2021-03-12: qty 30, 30d supply, fill #2

## 2021-01-06 NOTE — Patient Instructions (Addendum)
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  Recommend follow-up CT angiogram in a year unless he has new symptoms.  Patient has memory loss and mild cognitive impairment which is likely multifactorial due to combination of untreated underlying depression as well as history of heavy alcoholism.  I recommend we start Wellbutrin XL 150 mg daily and to follow-up with his primary care physician for more optimal treatment of his depression.  I also encouraged him to increase participation in cognitively challenging activities like solving crossword puzzles, playing bridge and sodoku.  We also discussed memory compensation strategies.  I complemented him on having quit drinking alcohol and encouraged him to continue to do so.  If on follow-up visit he still has persistent cognitive issues despite treatment of depression may consider trial of Aricept and Namenda.  He will return for follow-up in 3 months or call earlier if necessary.  Memory Compensation Strategies  1. Use "WARM" strategy.  W= write it down  A= associate it  R= repeat it  M= make a mental note  2.   You can keep a Social worker.  Use a 3-ring notebook with sections for the following: calendar, important names and phone numbers,  medications, doctors' names/phone numbers, lists/reminders, and a section to journal what you did  each day.   3.    Use a calendar to write appointments down.  4.    Write yourself a schedule for the day.  This can be placed on the calendar or in a separate section of the Memory Notebook.  Keeping a  regular schedule can help memory.  5.    Use medication organizer with sections for each day or morning/evening pills.  You may need help loading it  6.    Keep a  basket, or pegboard by the door.  Place items that you need to take out with you in the basket or on the pegboard.  You may also want to  include a message board for reminders.  7.    Use sticky notes.  Place sticky notes with reminders in a place where the task is performed.  For example: " turn off the  stove" placed by the stove, "lock the door" placed on the door at eye level, " take your medications" on  the bathroom mirror or by the place where you normally take your medications.  8.    Use alarms/timers.  Use while cooking to remind yourself to check on food or as a reminder to take your medicine, or as a  reminder to make a call, or as a reminder to perform another task, etc.
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
## 2021-01-07 ENCOUNTER — Telehealth: Payer: Self-pay | Admitting: Nurse Practitioner

## 2021-01-07 ENCOUNTER — Other Ambulatory Visit (HOSPITAL_COMMUNITY): Payer: Self-pay

## 2021-01-07 NOTE — Telephone Encounter (Signed)
Patients sister is aware she needs to call neuro in regards to him returning to work.

## 2021-01-10 ENCOUNTER — Other Ambulatory Visit (HOSPITAL_COMMUNITY): Payer: Self-pay

## 2021-01-10 ENCOUNTER — Telehealth: Payer: Self-pay | Admitting: Neurology

## 2021-01-10 NOTE — Telephone Encounter (Signed)
Sister called back with claim number for LTD paperwork.  Claim # [id number]  Will add to the paperwork.    Patient denied further questions, verbalized understanding and expressed appreciation for the phone call.

## 2021-01-10 NOTE — Telephone Encounter (Signed)
[name] Fincher(sister on DPR) is asking for a call from [place] to discuss a claim # she needs to provide and other concerns about pt, please call her at [phone]

## 2021-01-20 ENCOUNTER — Telehealth: Payer: Self-pay

## 2021-01-20 NOTE — Telephone Encounter (Signed)
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 Thank you.

## 2021-01-20 NOTE — Telephone Encounter (Signed)
A representative from [employer] [place] utilization review committee called to see if Dr. Estanislado Pandy had any additional restrictions to add to patient's disability paperwork.   [phone]

## 2021-01-20 NOTE — Telephone Encounter (Signed)
Attempted to contact the [place] and left message to advise we would like to add some additional restrictions.

## 2021-01-20 NOTE — Telephone Encounter (Signed)
Yes

## 2021-01-20 NOTE — Telephone Encounter (Signed)
Please call the patient and find out the details.

## 2021-01-20 NOTE — Telephone Encounter (Signed)
[phone] Anderson [place] from Utilization Review gave this number just in case provider wanted to add restrictions. Please advise.

## 2021-01-20 NOTE — Telephone Encounter (Signed)
Attempted to contact the patient and left message for patient to call the office.  

## 2021-01-21 ENCOUNTER — Encounter: Payer: Self-pay | Admitting: Family Medicine

## 2021-01-21 ENCOUNTER — Other Ambulatory Visit: Payer: Self-pay

## 2021-01-21 ENCOUNTER — Ambulatory Visit: Payer: BC Managed Care – PPO | Admitting: Family Medicine

## 2021-01-21 VITALS — BP 141/79 | HR 59 | Temp 97.9°F | Ht 67.0 in | Wt 192.8 lb

## 2021-01-21 DIAGNOSIS — S30863A Insect bite (nonvenomous) of scrotum and testes, initial encounter: Secondary | ICD-10-CM | POA: Diagnosis not present

## 2021-01-21 DIAGNOSIS — W57XXXA Bitten or stung by nonvenomous insect and other nonvenomous arthropods, initial encounter: Secondary | ICD-10-CM

## 2021-01-21 MED ORDER — DOXYCYCLINE HYCLATE 100 MG PO TABS: 100.0000 mg | ORAL_TABLET | Freq: Two times a day (BID) | ORAL | 0 refills | Status: AC

## 2021-01-21 NOTE — Progress Notes (Signed)
Acute Office Visit  Subjective:    Patient ID: Daniel Griffin, male    DOB: 09-25-1960, 60 y.o.   MRN: [id number]  Chief Complaint  Patient presents with  . Insect Bite    HPI Patient is in today for a tick bite on his scrotum. He tried removing the tick this morning and removed about half of it this morning. It was very small. It was attached for less than 1 day. He denies fever, body aches, chills, headache, neck pain, rash, weakness, rash, nausea, or vomiting.   Past Medical History:  Diagnosis Date  . Arthritis   . Seizures ([place]) 06/21/2020   ETOH w/d related    Past Surgical History:  Procedure Laterality Date  . ANKLE FRACTURE SURGERY     Left  . APPENDECTOMY      Family History  Problem Relation Age of Onset  . Alzheimer's disease Father   . Healthy Son   . Healthy Daughter   . Rheum arthritis Daughter   . Healthy Daughter     Social History   Socioeconomic History  . Marital status: Single    Spouse name: Not on file  . Number of children: Not on file  . Years of education: Not on file  . Highest education level: Not on file  Occupational History  . Occupation: not worked since seizure 06/21/20  Tobacco Use  . Smoking status: Never Smoker  . Smokeless tobacco: Current User    Types: Snuff, Chew  Vaping Use  . Vaping Use: Never used  Substance and Sexual Activity  . Alcohol use: Not Currently    Comment: Quit in 06/2020  . Drug use: Yes    Frequency: 2.0 times per week    Types: Marijuana  . Sexual activity: Not on file  Other Topics Concern  . Not on file  Social History Narrative   Lives alone   Right Handed   Rarely drinks caffeine       Social Determinants of Health   Financial Resource Strain: Not on file  Food Insecurity: Not on file  Transportation Needs: Not on file  Physical Activity: Not on file  Stress: Not on file  Social Connections: Not on file  Intimate Partner Violence: Not on file    Outpatient Medications  Prior to Visit  Medication Sig Dispense Refill  . Abatacept (ORENCIA CLICKJECT) 831 MG/ML SOAJ INJECT 125 MG INTO THE SKIN ONCE A WEEK. 12 mL 0  . Abatacept 125 MG/ML SOAJ INJECT 125 MG INTO THE SKIN ONCE A WEEK. 4 mL 2  . buPROPion (WELLBUTRIN XL) 150 MG 24 hr tablet Take 1 tablet (150 mg total) by mouth daily. 30 tablet 2   No facility-administered medications prior to visit.    No Known Allergies  Review of Systems     Objective:    Physical Exam Vitals and nursing note reviewed.  Constitutional:      General: He is not in acute distress.    Appearance: Normal appearance. He is not ill-appearing, toxic-appearing or diaphoretic.  Pulmonary:     Effort: Pulmonary effort is normal. No respiratory distress.  Skin:    General: Skin is warm and dry.     Comments: Small wound to scrotum from tick bite. Small amount of swelling surrounding. No erythema, warmth, or tenderness. No rash present. No drainage.   Neurological:     Mental Status: He is alert and oriented to person, place, and time.  Psychiatric:  Mood and Affect: Mood normal.        Behavior: Behavior normal.      BP (!) 141/79   Pulse (!) 59   Temp 97.9 F (36.6 C)   Ht 5\' 7"  (1.702 m)   Wt 192 lb 12.8 oz (87.5 kg)   SpO2 99%   BMI 30.20 kg/m  Wt Readings from Last 3 Encounters:  01/21/21 192 lb 12.8 oz (87.5 kg)  01/06/21 187 lb 6.4 oz (85 kg)  12/23/20 189 lb (85.7 kg)   Foreign Body Removal  Date/Time: 01/21/2021 12:31 PM Performed by: Gwenlyn Perking, FNP Authorized by: Gwenlyn Perking, FNP  Intake: scrotum. Anesthesia method: none.  Anesthesia: Local anesthetic: none.  Sedation: Patient sedated: no  Patient restrained: no Patient cooperative: yes Complexity: simple Post-procedure assessment: foreign body removed Patient tolerance: patient tolerated the procedure well with no immediate complications Comments: Prepped with betadine. Tick removed. Cleansed with saline.     Health  Maintenance Due  Topic Date Due  . COVID-19 Vaccine (1) Never done  . Pneumococcal Vaccine 40-22 Years old (1 of 4 - PCV13) Never done  . Zoster Vaccines- Shingrix (1 of 2) Never done    There are no preventive care reminders to display for this patient.   Lab Results  Component Value Date   TSH 2.180 09/14/2020   Lab Results  Component Value Date   WBC 8.4 09/21/2020   HGB 13.5 09/21/2020   HCT 40.0 09/21/2020   MCV 91.3 09/21/2020   PLT 218 09/21/2020   Lab Results  Component Value Date   NA 140 09/21/2020   K 4.3 09/21/2020   CO2 30 09/21/2020   GLUCOSE 69 09/21/2020   BUN 15 09/21/2020   CREATININE 0.82 09/21/2020   BILITOT 0.6 06/28/2020   ALKPHOS 61 06/28/2020   AST 15 11/16/2020   ALT 13 11/16/2020   PROT 6.8 06/28/2020   ALBUMIN 4.1 06/28/2020   CALCIUM 9.0 09/21/2020   ANIONGAP 7 06/22/2020   Lab Results  Component Value Date   CHOL 139 06/22/2020   Lab Results  Component Value Date   HDL 75 06/22/2020   Lab Results  Component Value Date   LDLCALC 54 06/22/2020   Lab Results  Component Value Date   TRIG 49 06/22/2020   Lab Results  Component Value Date   CHOLHDL 1.9 06/22/2020   Lab Results  Component Value Date   HGBA1C 5.0 06/21/2020       Assessment & Plan:   Curtez was seen today for insect bite.  Diagnoses and all orders for this visit:  Tick bite of scrotum, initial encounter Tick removed in office today. Doxycycline ordered as below.  -     doxycycline (VIBRA-TABS) 100 MG tablet; Take 1 tablet (100 mg total) by mouth 2 (two) times daily for 7 days. 1 po bid -     Foreign Body Removal  Return to office for new or worsening symptoms, or if symptoms persist.   The patient indicates understanding of these issues and agrees with the plan.   Gwenlyn Perking, FNP

## 2021-01-21 NOTE — Telephone Encounter (Signed)
LMOM

## 2021-01-21 NOTE — Patient Instructions (Signed)
Tick Bite Information, Adult Ticks are insects that draw blood for food. Most ticks live in shrubs and grassy and wooded areas. They climb onto people and animals that brush against the leaves and grasses that they rest on. Then they bite, attaching themselves to the skin. Most ticks are harmless, but some ticks may carry germs that can spread to a person through a bite and cause a disease. To reduce your risk of getting a disease from a tick bite, make sure you:  Take steps to prevent tick bites.  Check for ticks after being outdoors where ticks live.  Watch for symptoms of disease if a tick attached to you or if you suspect a tick bite. How can I prevent tick bites? Take these steps to help prevent tick bites when you go outdoors in an area where ticks live: Use insect repellent  Use insect repellent that has DEET (20% or higher), picaridin, or IR3535 in it. Follow the instructions on the label. Use these products on: ? Bare skin. ? The top of your boots. ? Your pant legs. ? Your sleeve cuffs.  For insect repellent that contains permethrin, follow the instructions on the label. Use these products on: ? Clothing. ? Boots. ? Outdoor gear. ? Tents. When you are outside  Wear protective clothing. Long sleeves and long pants offer the best protection from ticks.  Wear light-colored clothing so you can see ticks more easily.  Tuck your pant legs into your socks.  If you go walking on a trail, stay in the middle of the trail so your skin, hair, and clothing do not touch the bushes.  Avoid walking through areas with long grass.  Check for ticks on your clothing, hair, and skin often while you are outside, and check again before you go inside. Make sure to check the scalp, neck, armpits, waist, groin, and joint areas. These are the spots where ticks attach themselves most often. When you go indoors  Check your clothing for ticks. Tumble dry clothes in a dryer on high heat for at least  10 minutes. If clothes are damp, additional time may be needed. If clothes require washing, use hot water.  Examine gear and pets.  Shower soon after being outdoors.  Check your body for ticks. Conduct a full body check using a mirror. What is the proper way to remove a tick? If you find a tick on your body, remove it as soon as possible. Removing a tick sooner can prevent germs from passing to your body. Do not remove the tick with your bare fingers. To remove a tick that is crawling on your skin but has not bitten, use either of these methods:  Go outdoors and brush the tick off.  Remove the tick with tape or a lint roller. To remove a tick that is attached to your skin: 1. Wash your hands. If you have latex gloves, put them on. 2. Use fine-tipped tweezers, curved forceps, or a tick-removal tool to gently grasp the tick as close to your skin and the tick's head as possible. 3. Gently pull with a steady, upward, even pressure until the tick lets go. 4. When removing the tick: ? Take care to keep the tick's head attached to its body. ? Do not twist or jerk the tick. This can make the tick's head or mouth parts break off and remain in the skin. ? Do not squeeze or crush the tick's body. This could force disease-carrying fluids from the tick   into your body. Do not try to remove a tick with heat, alcohol, petroleum jelly, or fingernail polish. Using these methods can cause the tick to salivate and regurgitate into your bloodstream, increasing your risk of getting a disease.   What should I do after removing a tick?  Dispose of the tick. Do not crush a tick with your fingers.  Clean the bite area and your hands with soap and water, rubbing alcohol, or an iodine scrub.  If an antiseptic cream or ointment is available, apply a small amount to the bite site.  Wash and disinfect any instruments that you used to remove the tick. How should I dispose of a tick? To dispose of a live tick, use  one of these methods:  Place it in rubbing alcohol.  Place it in a sealed bag or container.  Wrap it tightly in tape.  Flush it down the toilet. Contact a health care provider if:  You have symptoms of a disease after a tick bite. Symptoms of a tick-borne disease can occur from moments after the tick bites to 30 days after a tick is removed. Symptoms include: ? Fever or chills. ? Any of these signs in the bite area:  A red rash that makes a circle (bull's-eye rash) in the bite area.  Redness and swelling. ? Headache. ? Muscle, joint, or bone pain. ? Abnormal tiredness. ? Numbness in your legs or difficulty walking or moving your legs. ? Tender, swollen lymph glands.  A part of a tick breaks off and gets stuck in your skin. Get help right away if:  You are not able to remove a tick.  You experience muscle weakness or paralysis.  Your symptoms get worse or you experience new symptoms.  You find an engorged tick on your skin and you are in an area where disease from ticks is a high risk. Summary  Ticks may carry germs that can spread to a person through a bite and cause a disease.  Wear protective clothing and use insect repellent to prevent tick bites. Follow the instructions on the label.  If you find a tick on your body, remove it as soon as possible. If the tick is attached, do not try to remove with heat, alcohol, petroleum jelly, or fingernail polish.  Remove the attached tick using fine-tipped tweezers, curved forceps, or a tick-removal tool. Gently pull with steady, upward, even pressure until the tick lets go. Do not twist or jerk the tick. Do not squeeze or crush the tick's body.  If you have symptoms of a disease after being bitten by a tick, contact a health care provider. This information is not intended to replace advice given to you by your health care provider. Make sure you discuss any questions you have with your health care provider. Document Revised:  08/04/2019 Document Reviewed: 08/04/2019 Elsevier Patient Education  2021 Elsevier Inc.  

## 2021-01-21 NOTE — Telephone Encounter (Signed)
Utilization Review [facility] [place]) called, following up on if you had anymore information.  Contact info: [phone]

## 2021-01-24 ENCOUNTER — Telehealth: Payer: Self-pay | Admitting: Nurse Practitioner

## 2021-01-24 NOTE — Telephone Encounter (Signed)
Not that I am aware of- see if can find

## 2021-01-24 NOTE — Telephone Encounter (Signed)
LVM for Daniel Griffin to return call.    As of this date, patient has not been seen 01/06/21 with no additional information to provide.

## 2021-01-24 NOTE — Telephone Encounter (Signed)
[place] [employer] contacted the office stating they received the additional restrictions.

## 2021-01-24 NOTE — Telephone Encounter (Signed)
Spoke with Daniel Griffin and advised her that we haven't received any paperwork from the rheumatologist. Gave phone number for their office and she advised she would call them and have them fax it over to [place]

## 2021-01-26 DIAGNOSIS — Z0289 Encounter for other administrative examinations: Secondary | ICD-10-CM

## 2021-02-02 ENCOUNTER — Other Ambulatory Visit (HOSPITAL_COMMUNITY): Payer: Self-pay

## 2021-02-07 ENCOUNTER — Other Ambulatory Visit (HOSPITAL_COMMUNITY): Payer: Self-pay

## 2021-02-10 ENCOUNTER — Other Ambulatory Visit (HOSPITAL_COMMUNITY): Payer: Self-pay

## 2021-02-15 ENCOUNTER — Other Ambulatory Visit: Payer: Self-pay

## 2021-02-15 ENCOUNTER — Ambulatory Visit: Payer: BC Managed Care – PPO | Admitting: Nurse Practitioner

## 2021-02-15 ENCOUNTER — Encounter: Payer: Self-pay | Admitting: Nurse Practitioner

## 2021-02-15 VITALS — BP 141/88 | HR 101 | Temp 98.6°F | Resp 20 | Ht 67.0 in | Wt 191.0 lb

## 2021-02-15 DIAGNOSIS — M25512 Pain in left shoulder: Secondary | ICD-10-CM | POA: Diagnosis not present

## 2021-02-15 DIAGNOSIS — R5383 Other fatigue: Secondary | ICD-10-CM | POA: Diagnosis not present

## 2021-02-15 DIAGNOSIS — Z0271 Encounter for disability determination: Secondary | ICD-10-CM

## 2021-02-15 DIAGNOSIS — F322 Major depressive disorder, single episode, severe without psychotic features: Secondary | ICD-10-CM | POA: Diagnosis not present

## 2021-02-15 DIAGNOSIS — R739 Hyperglycemia, unspecified: Secondary | ICD-10-CM | POA: Diagnosis not present

## 2021-02-15 DIAGNOSIS — M25561 Pain in right knee: Secondary | ICD-10-CM | POA: Diagnosis not present

## 2021-02-15 DIAGNOSIS — M25511 Pain in right shoulder: Secondary | ICD-10-CM | POA: Diagnosis not present

## 2021-02-15 DIAGNOSIS — R6889 Other general symptoms and signs: Secondary | ICD-10-CM | POA: Diagnosis not present

## 2021-02-15 DIAGNOSIS — M25562 Pain in left knee: Secondary | ICD-10-CM | POA: Diagnosis not present

## 2021-02-15 MED ORDER — ESCITALOPRAM OXALATE 10 MG PO TABS
10.0000 mg | ORAL_TABLET | Freq: Every day | ORAL | 5 refills | Status: DC
Start: 2021-02-15 — End: 2021-08-17

## 2021-02-15 NOTE — Patient Instructions (Signed)
Major Depressive Disorder, Adult Major depressive disorder is a mental health condition. This disorder affects feelings. It can also affect the body. Symptoms of this condition last most of the day, almost every day, for 2 weeks. This disorder can affect: Relationships. Daily activities, such as work and school. Activities that you normally like to do. What are the causes? The cause of this condition is not known. The disorder is likely caused by a mix of things, including: Your personality, such as being a shy person. Your behavior, or how you act toward others. Your thoughts and feelings. Too much alcohol or drugs. How you react to stress. Health and mental problems that you have had for a long time. Things that hurt you in the past (trauma). Big changes in your life, such as divorce. What increases the risk? The following factors may make you more likely to develop this condition: Having family members with depression. Being a woman. Problems in the family. Low levels of some brain chemicals. Things that caused you pain as a child, especially if you lost a parent or were abused. A lot of stress in your life, such as from: Living without basic needs of life, such as food and shelter. Being treated poorly because of race, sex, or religion (discrimination). Health and mental problems that you have had for a long time. What are the signs or symptoms? The main symptoms of this condition are: Being sad all the time. Being grouchy all the time. Loss of interest in things and activities. Other symptoms include: Sleeping too much or too little. Eating too much or too little. Gaining or losing weight, without knowing why. Feeling tired or having low energy. Being restless and weak. Feeling hopeless, worthless, or guilty. Trouble thinking clearly or making decisions. Thoughts of hurting yourself or others, or thoughts of ending your life. Spending a lot of time alone. Inability to  complete common tasks of daily life. If you have very bad MDD, you may: Believe things that are not true. Hear, see, taste, or feel things that are not there. Have mild depression that lasts for at least 2 years. Feel very sad and hopeless. Have trouble speaking or moving. How is this treated? This condition may be treated with: Talk therapy. This teaches you to know bad thoughts, feelings, and actions and how to change them. This can also help you to communicate with others. This can be done with members of your family. Medicines. These can be used to treat worry (anxiety), depression, or low levels of chemicals in the brain. Lifestyle changes. You may need to: Limit alcohol use. Limit drug use. Get regular exercise. Get plenty of sleep. Make healthy eating choices. Spend more time outdoors. Brain stimulation. This treatment excites the brain. This is done when symptoms are very bad or have not gotten better with other treatments. Follow these instructions at home: Activity Get regular exercise as told. Spend time outdoors as told. Make time to do the things you enjoy. Find ways to deal with stress. Try to: Meditate. Do deep breathing. Spend time in nature. Keep a journal. Return to your normal activities as told by your doctor. Ask your doctor what activities are safe for you. Alcohol and drug use If you drink alcohol: Limit how much you use to: 0-1 drink a day for women. 0-2 drinks a day for men. Be aware of how much alcohol is in your drink. In the [place] one drink equals one 12 oz bottle of beer (355 mL),   one 5 oz glass of wine (148 mL), or one 1 oz glass of hard liquor (44 mL). Talk to your doctor about: Alcohol use. Alcohol can affect some medicines. Any drug use. General instructions  Take over-the-counter and prescription medicines and herbal preparations only as told by your doctor. Eat a healthy diet. Get a lot of sleep. Think about joining a support group.  Your doctor may be able to suggest one. Keep all follow-up visits as told by your doctor. This is important.  Where to find more information: National Alliance on Mental Illness: www.nami.org [place] National Institute of Mental Health: www.nimh.nih.gov American Psychiatric Association: www.psychiatry.org/patients-families/ Contact a doctor if: Your symptoms get worse. You get new symptoms. Get help right away if: You hurt yourself. You have serious thoughts about hurting yourself or others. You see, hear, taste, smell, or feel things that are not there. If you ever feel like you may hurt yourself or others, or have thoughts about taking your own life, get help right away. Go to your nearest emergency department or: Call your local emergency services (911 in the [place] Call a suicide crisis helpline, such as the National Suicide Prevention Lifeline at 1-800-273-8255. This is open 24 hours a day in the [place] Text the Crisis Text Line at 741741 (in the [place] Summary Major depressive disorder is a mental health condition. This disorder affects feelings. Symptoms of this condition last most of the day, almost every day, for 2 weeks. The symptoms of this disorder can cause problems with relationships and with daily activities. There are treatments and support for people who get this disorder. You may need more than one type of treatment. Get help right away if you have serious thoughts about hurting yourself or others. This information is not intended to replace advice given to you by your health care provider. Make sure you discuss any questions you have with your healthcare provider. Document Revised: 07/19/2019 Document Reviewed: 07/19/2019 Elsevier Patient Education  2022 Elsevier Inc.  

## 2021-02-15 NOTE — Progress Notes (Signed)
   Subjective:    Patient ID: Daniel Griffin, male    DOB: 09-04-1960, 60 y.o.   MRN: [id number]   Chief Complaint: Fatigue   HPI Patient come sin today accompanied by his sister c/o of fatigue and weakness. His sister says that he has been sleeping a lot during the day and staying up at night. He says he just does not have any energy. Started about 1 1/2 months ago. Sister says he stays irritable all th time. Neurologist started him on wellbutrin which has not helped at all.  Depression screen [facility] 2/9 02/15/2021 01/21/2021 12/23/2020  Decreased Interest 3 0 0  Down, Depressed, Hopeless 2 0 0  PHQ - 2 Score 5 0 0  Altered sleeping 3 - -  Tired, decreased energy 3 - -  Change in appetite 3 - -  Feeling bad or failure about yourself  3 - -  Trouble concentrating 3 - -  Moving slowly or fidgety/restless 3 - -  Suicidal thoughts 0 - -  PHQ-9 Score 23 - -  Difficult doing work/chores Somewhat difficult - -        Review of Systems  Constitutional: Negative.   Respiratory: Negative.    Cardiovascular: Negative.   Gastrointestinal: Negative.   Neurological: Negative.   Psychiatric/Behavioral: Negative.    All other systems reviewed and are negative.     Objective:   Physical Exam Vitals and nursing note reviewed.  Constitutional:      Appearance: Normal appearance.  Cardiovascular:     Rate and Rhythm: Normal rate and regular rhythm.     Heart sounds: Normal heart sounds.  Pulmonary:     Effort: Pulmonary effort is normal.     Breath sounds: Normal breath sounds.  Skin:    General: Skin is warm.  Neurological:     General: No focal deficit present.     Mental Status: He is alert and oriented to person, place, and time.  Psychiatric:        Mood and Affect: Mood normal.        Behavior: Behavior normal.    BP (!) 141/88   Pulse (!) 101   Temp 98.6 F (37 C) (Temporal)   Resp 20   Ht $R'5\' 7"'PR$  (1.702 m)   Wt 191 lb (86.6 kg)   SpO2 95%   BMI 29.91 kg/m         Assessment & Plan:   Daniel Griffin in today with chief complaint of Fatigue   1. Depression, major, single episode, severe (HCC) No napping during the day Add lexapro to the wellbutrin - escitalopram (LEXAPRO) 10 MG tablet; Take 1 tablet (10 mg total) by mouth daily.  Dispense: 30 tablet; Refill: 5  2. Fatigue, unspecified type Labs pending - CBC with Differential/Platelet - CMP14+EGFR - Thyroid Panel With TSH    The above assessment and management plan was discussed with the patient. The patient verbalized understanding of and has agreed to the management plan. Patient is aware to call the clinic if symptoms persist or worsen. Patient is aware when to return to the clinic for a follow-up visit. Patient educated on when it is appropriate to go to the emergency department.   Mary-Margaret Hassell Done, FNP

## 2021-02-16 LAB — CMP14+EGFR
ALT: 19 IU/L (ref 0–44)
AST: 17 IU/L (ref 0–40)
Albumin/Globulin Ratio: 1.6 (ref 1.2–2.2)
Albumin: 4.5 g/dL (ref 3.8–4.9)
Alkaline Phosphatase: 78 IU/L (ref 44–121)
BUN/Creatinine Ratio: 18 (ref 10–24)
BUN: 16 mg/dL (ref 8–27)
Bilirubin Total: 0.6 mg/dL (ref 0.0–1.2)
CO2: 23 mmol/L (ref 20–29)
Calcium: 9.9 mg/dL (ref 8.6–10.2)
Chloride: 98 mmol/L (ref 96–106)
Creatinine, Ser: 0.89 mg/dL (ref 0.76–1.27)
Globulin, Total: 2.9 g/dL (ref 1.5–4.5)
Glucose: 147 mg/dL — ABNORMAL HIGH (ref 65–99)
Potassium: 4.5 mmol/L (ref 3.5–5.2)
Sodium: 137 mmol/L (ref 134–144)
Total Protein: 7.4 g/dL (ref 6.0–8.5)
eGFR: 98 mL/min/{1.73_m2} (ref >59)

## 2021-02-16 LAB — CBC WITH DIFFERENTIAL/PLATELET
Basophils Absolute: 0.1 10*3/uL (ref 0.0–0.2)
Basos: 1 %
EOS (ABSOLUTE): 0 10*3/uL (ref 0.0–0.4)
Eos: 0 %
Hematocrit: 43.6 % (ref 37.5–51.0)
Hemoglobin: 14.4 g/dL (ref 13.0–17.7)
Immature Grans (Abs): 0.2 10*3/uL — ABNORMAL HIGH (ref 0.0–0.1)
Immature Granulocytes: 2 %
Lymphocytes Absolute: 0.6 10*3/uL — ABNORMAL LOW (ref 0.7–3.1)
Lymphs: 6 %
MCH: 30.8 pg (ref 26.6–33.0)
MCHC: 33 g/dL (ref 31.5–35.7)
MCV: 93 fL (ref 79–97)
Monocytes Absolute: 0.2 10*3/uL (ref 0.1–0.9)
Monocytes: 2 %
Neutrophils Absolute: 8.7 10*3/uL — ABNORMAL HIGH (ref 1.4–7.0)
Neutrophils: 89 %
Platelets: 253 10*3/uL (ref 150–450)
RBC: 4.67 x10E6/uL (ref 4.14–5.80)
RDW: 13 % (ref 11.6–15.4)
WBC: 9.6 10*3/uL (ref 3.4–10.8)

## 2021-02-16 LAB — THYROID PANEL WITH TSH
Free Thyroxine Index: 3 (ref 1.2–4.9)
T3 Uptake Ratio: 26 % (ref 24–39)
T4, Total: 11.6 ug/dL (ref 4.5–12.0)
TSH: 1.18 u[IU]/mL (ref 0.450–4.500)

## 2021-02-17 NOTE — Addendum Note (Signed)
Addended by: Chevis Pretty on: 02/17/2021 01:35 PM   Modules accepted: Orders

## 2021-02-22 NOTE — Telephone Encounter (Signed)
Pt's sister, Vinnie Level (on [place]) would like to make sure she is contacted at [phone] to discuss getting copy of disability form. Would like a call from the nurse.

## 2021-02-22 NOTE — Telephone Encounter (Signed)
Pt's sister, Vinnie Level (on [place]) called, paid for disability form to be filled out. Need a copy of disability form to send with appeal. Would like a call from the nurse.

## 2021-02-23 LAB — HGB A1C W/O EAG: Hgb A1c MFr Bld: 5.3 % (ref 4.8–5.6)

## 2021-02-23 LAB — SPECIMEN STATUS REPORT

## 2021-02-24 ENCOUNTER — Other Ambulatory Visit (HOSPITAL_COMMUNITY): Payer: Self-pay

## 2021-02-25 ENCOUNTER — Other Ambulatory Visit: Payer: Self-pay | Admitting: *Deleted

## 2021-02-25 DIAGNOSIS — Z79899 Other long term (current) drug therapy: Secondary | ICD-10-CM | POA: Diagnosis not present

## 2021-02-26 LAB — CBC WITH DIFFERENTIAL/PLATELET
Absolute Monocytes: 1131 cells/uL — ABNORMAL HIGH (ref 200–950)
Basophils Absolute: 45 cells/uL (ref 0–200)
Basophils Relative: 0.4 %
Eosinophils Absolute: 45 cells/uL (ref 15–500)
Eosinophils Relative: 0.4 %
HCT: 41 % (ref 38.5–50.0)
Hemoglobin: 13.6 g/dL (ref 13.2–17.1)
Lymphs Abs: 1355 cells/uL (ref 850–3900)
MCH: 31.6 pg (ref 27.0–33.0)
MCHC: 33.2 g/dL (ref 32.0–36.0)
MCV: 95.1 fL (ref 80.0–100.0)
MPV: 10.2 fL (ref 7.5–12.5)
Monocytes Relative: 10.1 %
Neutro Abs: 8624 cells/uL — ABNORMAL HIGH (ref 1500–7800)
Neutrophils Relative %: 77 %
Platelets: 222 10*3/uL (ref 140–400)
RBC: 4.31 10*6/uL (ref 4.20–5.80)
RDW: 12.7 % (ref 11.0–15.0)
Total Lymphocyte: 12.1 %
WBC: 11.2 10*3/uL — ABNORMAL HIGH (ref 3.8–10.8)

## 2021-02-26 LAB — COMPLETE METABOLIC PANEL WITH GFR
AG Ratio: 1.9 (calc) (ref 1.0–2.5)
ALT: 17 U/L (ref 9–46)
AST: 12 U/L (ref 10–35)
Albumin: 3.9 g/dL (ref 3.6–5.1)
Alkaline phosphatase (APISO): 57 U/L (ref 35–144)
BUN: 18 mg/dL (ref 7–25)
CO2: 29 mmol/L (ref 20–32)
Calcium: 8.9 mg/dL (ref 8.6–10.3)
Chloride: 106 mmol/L (ref 98–110)
Creat: 0.81 mg/dL (ref 0.70–1.25)
GFR, Est African American: 112 mL/min/{1.73_m2} (ref >=60)
GFR, Est Non African American: 97 mL/min/{1.73_m2} (ref >=60)
Globulin: 2.1 g/dL (calc) (ref 1.9–3.7)
Glucose, Bld: 92 mg/dL (ref 65–99)
Potassium: 4.7 mmol/L (ref 3.5–5.3)
Sodium: 140 mmol/L (ref 135–146)
Total Bilirubin: 0.8 mg/dL (ref 0.2–1.2)
Total Protein: 6 g/dL — ABNORMAL LOW (ref 6.1–8.1)

## 2021-02-28 ENCOUNTER — Other Ambulatory Visit (HOSPITAL_COMMUNITY): Payer: Self-pay

## 2021-02-28 NOTE — Progress Notes (Signed)
White cell count is mildly elevated.  The patient does not have an infection then no intervention needed.  CMP is stable.

## 2021-03-01 NOTE — Telephone Encounter (Signed)
Mental Health Attending 57 Statement for [facility] signed by Dr. Leonie Man.  Given to [employer] in MR.

## 2021-03-02 ENCOUNTER — Other Ambulatory Visit (HOSPITAL_COMMUNITY): Payer: Self-pay

## 2021-03-02 ENCOUNTER — Telehealth: Payer: Self-pay | Admitting: *Deleted

## 2021-03-02 NOTE — Telephone Encounter (Signed)
Pt [place] form faxed on 03/02/21

## 2021-03-07 ENCOUNTER — Other Ambulatory Visit: Payer: Self-pay | Admitting: Physician Assistant

## 2021-03-07 ENCOUNTER — Other Ambulatory Visit (HOSPITAL_COMMUNITY): Payer: Self-pay

## 2021-03-07 ENCOUNTER — Other Ambulatory Visit: Payer: Self-pay | Admitting: Rheumatology

## 2021-03-08 ENCOUNTER — Other Ambulatory Visit (HOSPITAL_COMMUNITY): Payer: Self-pay

## 2021-03-08 ENCOUNTER — Other Ambulatory Visit: Payer: Self-pay | Admitting: Rheumatology

## 2021-03-08 MED ORDER — ORENCIA CLICKJECT 125 MG/ML ~~LOC~~ SOAJ
125.0000 mg | SUBCUTANEOUS | 0 refills | Status: DC
  Filled 2021-03-08: qty 4, 28d supply, fill #0
  Filled 2021-04-04: qty 4, 28d supply, fill #1

## 2021-03-08 NOTE — Telephone Encounter (Signed)
Next Visit: Return in about 5 months (around 04/18/2021) for Rheumatoid arthritis   Last Visit: 11/16/2020   Last Fill: 12/07/2020  DX:  Rheumatoid arthritis involving multiple sites with positive rheumatoid factor    Current Dose per office note 11/16/2020, Orencia sq 1q week    Labs: 02/25/2021 White cell count is mildly elevated.  The patient does not have an infectionthen no intervention needed.  CMP is stable.   TB Gold: 08/27/2020, negative   Okay to refill Orencia?

## 2021-03-08 NOTE — Telephone Encounter (Signed)
Please schedule patient for a follow up appointment. Patient was due May 2022. Thanks!

## 2021-03-12 ENCOUNTER — Other Ambulatory Visit (HOSPITAL_COMMUNITY): Payer: Self-pay

## 2021-03-14 ENCOUNTER — Other Ambulatory Visit (HOSPITAL_COMMUNITY): Payer: Self-pay

## 2021-03-21 ENCOUNTER — Telehealth: Payer: Self-pay | Admitting: *Deleted

## 2021-03-21 NOTE — Telephone Encounter (Signed)
Pt [place] form in nurse Pod

## 2021-03-22 DIAGNOSIS — M19112 Post-traumatic osteoarthritis, left shoulder: Secondary | ICD-10-CM | POA: Diagnosis not present

## 2021-03-24 ENCOUNTER — Other Ambulatory Visit: Payer: Self-pay | Admitting: Orthopedic Surgery

## 2021-03-24 DIAGNOSIS — M25512 Pain in left shoulder: Secondary | ICD-10-CM

## 2021-03-31 ENCOUNTER — Other Ambulatory Visit (HOSPITAL_COMMUNITY): Payer: Self-pay

## 2021-04-04 ENCOUNTER — Telehealth: Payer: Self-pay | Admitting: Pharmacy Technician

## 2021-04-04 ENCOUNTER — Other Ambulatory Visit (HOSPITAL_COMMUNITY): Payer: Self-pay

## 2021-04-04 NOTE — Telephone Encounter (Signed)
Renewed patient's copay card:  [id number] PCN- [employer] ID- [id number] Grp- [id number]

## 2021-04-13 DIAGNOSIS — M13861 Other specified arthritis, right knee: Secondary | ICD-10-CM | POA: Diagnosis not present

## 2021-04-13 DIAGNOSIS — M13862 Other specified arthritis, left knee: Secondary | ICD-10-CM | POA: Diagnosis not present

## 2021-04-20 NOTE — H&P (Signed)
  Patient's anticipated LOS is less than 2 midnights, meeting these requirements: - Younger than 56 - Lives within 1 hour of care - Has a competent adult at home to recover with post-op recover - NO history of  - Chronic pain requiring opiods  - Diabetes  - Coronary Artery Disease  - Heart failure  - Heart attack  - Stroke  - DVT/VTE  - Cardiac arrhythmia  - Respiratory Failure/COPD  - Renal failure  - Anemia  - Advanced Liver disease     Daniel Griffin is an 60 y.o. male.    Chief Complaint: left shoulder pain  HPI: Pt is a 60 y.o. male complaining of left shoulder pain for multiple years. Pain had continually increased since the beginning. X-rays in the clinic show end-stage arthritic changes of the left shoulder. Pt has tried various conservative treatments which have failed to alleviate their symptoms, including injections and therapy. Various options are discussed with the patient. Risks, benefits and expectations were discussed with the patient. Patient understand the risks, benefits and expectations and wishes to proceed with surgery.   PCP:  Chevis Pretty, FNP  D/C Plans: Home  PMH: Past Medical History:  Diagnosis Date   Arthritis    Seizures ([place]) 06/21/2020   ETOH w/d related    PSH: Past Surgical History:  Procedure Laterality Date   ANKLE FRACTURE SURGERY     Left   APPENDECTOMY      Social History:  reports that he has never smoked. His smokeless tobacco use includes snuff and chew. He reports that he does not currently use alcohol. He reports current drug use. Frequency: 2.00 times per week. Drug: Marijuana.  Allergies:  No Known Allergies  Medications: No current facility-administered medications for this encounter.   Current Outpatient Medications  Medication Sig Dispense Refill   Abatacept (ORENCIA CLICKJECT) 0000000 MG/ML SOAJ INJECT 125 MG INTO THE SKIN ONCE A WEEK. 12 mL 0   Abatacept 125 MG/ML SOAJ INJECT 125 MG INTO THE SKIN ONCE A  WEEK. 4 mL 2   buPROPion (WELLBUTRIN XL) 150 MG 24 hr tablet Take 1 tablet (150 mg total) by mouth daily. 30 tablet 2   escitalopram (LEXAPRO) 10 MG tablet Take 1 tablet (10 mg total) by mouth daily. 30 tablet 5    No results found for this or any previous visit (from the past 48 hour(s)). No results found.  ROS: Pain with rom of the left lower extremity  Physical Exam: Alert and oriented 60 y.o. male in no acute distress Cranial nerves 2-12 intact Cervical spine: full rom with no tenderness, nv intact distally Chest: active breath sounds bilaterally, no wheeze rhonchi or rales Heart: regular rate and rhythm, no murmur Abd: non tender non distended with active bowel sounds Hip is stable with rom  Left shoulder painful rom  Nv intact distally No rashes or edema distally  Assessment/Plan Assessment: left shoulder cuff arthropathy  Plan:  Patient will undergo a left reverse total shoulder by Dr. Veverly Fells at [facility] benefits and expectations were discussed with the patient. Patient understand risks, benefits and expectations and wishes to proceed. Preoperative templating of the joint replacement has been completed, documented, and submitted to the Operating Room personnel in order to optimize intra-operative equipment management.   Merla Riches PA-C, MPAS [facility] is now [facility] [address], [place], [place], [place] [address] Phone: [phone] [email or web address] Facebook  [employer]

## 2021-04-21 ENCOUNTER — Other Ambulatory Visit: Payer: Self-pay

## 2021-04-21 ENCOUNTER — Encounter: Payer: Self-pay | Admitting: Adult Health

## 2021-04-21 ENCOUNTER — Ambulatory Visit (INDEPENDENT_AMBULATORY_CARE_PROVIDER_SITE_OTHER): Payer: BC Managed Care – PPO | Admitting: Adult Health

## 2021-04-21 VITALS — BP 139/82 | HR 72 | Ht 67.0 in | Wt 194.0 lb

## 2021-04-21 DIAGNOSIS — G3184 Mild cognitive impairment, so stated: Secondary | ICD-10-CM | POA: Diagnosis not present

## 2021-04-21 DIAGNOSIS — I671 Cerebral aneurysm, nonruptured: Secondary | ICD-10-CM

## 2021-04-21 MED ORDER — BUPROPION HCL ER (XL) 150 MG PO TB24: 150.0000 mg | ORAL_TABLET | Freq: Every day | ORAL | 3 refills | Status: DC
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
## 2021-04-21 NOTE — Patient Instructions (Addendum)
Your Plan:   Plan to repeat imaging for surveillance monitoring of cerebral aneurysm in December   Continue to keep active and ensure memory exercises routinely    Follow up in 6 months or call earlier if needed     Thank you for coming to see [place] at [facility]. I hope we have been able to provide you high quality care today.  You may receive a patient satisfaction survey over the next few weeks. We would appreciate your feedback and comments so that we may continue to improve ourselves and the health of our patients.

## 2021-04-26 ENCOUNTER — Telehealth: Payer: Self-pay

## 2021-04-26 NOTE — Telephone Encounter (Signed)
Mental Health Attending 62 Statement for [employer] form received and placed on jessica's desk for review.

## 2021-04-27 ENCOUNTER — Telehealth: Payer: Self-pay | Admitting: Adult Health

## 2021-04-27 ENCOUNTER — Ambulatory Visit
Admission: RE | Admit: 2021-04-27 | Discharge: 2021-04-27 | Disposition: A | Discharge status: Home / Self Care | Payer: BC Managed Care – PPO | Source: Ambulatory Visit | Attending: Orthopedic Surgery | Admitting: Orthopedic Surgery

## 2021-04-27 DIAGNOSIS — M25512 Pain in left shoulder: Secondary | ICD-10-CM

## 2021-04-27 DIAGNOSIS — M19012 Primary osteoarthritis, left shoulder: Secondary | ICD-10-CM | POA: Diagnosis not present

## 2021-04-27 NOTE — Telephone Encounter (Signed)
Form completed, given back to debra, medical records to see if pages are missing ?

## 2021-04-27 NOTE — Telephone Encounter (Signed)
Completed. No area to sign. ?missing a couple pages?

## 2021-04-27 NOTE — Telephone Encounter (Signed)
BCBS [id number] (exp. 04/27/21 to 05/26/21) order sent to GI. They will reach out to the patient to schedule.

## 2021-04-28 ENCOUNTER — Other Ambulatory Visit (HOSPITAL_COMMUNITY): Payer: Self-pay

## 2021-04-28 ENCOUNTER — Other Ambulatory Visit: Payer: Self-pay | Admitting: Rheumatology

## 2021-04-28 MED ORDER — ORENCIA CLICKJECT 125 MG/ML ~~LOC~~ SOAJ
125.0000 mg | SUBCUTANEOUS | 2 refills | Status: DC
  Filled 2021-04-28: qty 4, 28d supply, fill #0
  Filled 2021-06-09: qty 4, 28d supply, fill #1
  Filled 2021-07-11: qty 4, 28d supply, fill #2

## 2021-04-28 NOTE — Telephone Encounter (Signed)
Next Visit: Return in about 5 months (around 04/18/2021) for Rheumatoid arthritis. Message sent to the front to schedule.    Last Visit: 11/16/2020   Last Fill: 12/07/2020   DX:  Rheumatoid arthritis involving multiple sites with positive rheumatoid factor    Current Dose per office note 11/16/2020, Orencia sq 1q week    Labs: 02/25/2021 White cell count is mildly elevated.  The patient does not have an infectionthen no intervention needed.  CMP is stable.   TB Gold: 08/27/2020, negative   Okay to refill Orencia?

## 2021-04-28 NOTE — Telephone Encounter (Signed)
Please schedule patient for a follow up visit. Patient was due in August 2022. Thanks!

## 2021-04-29 ENCOUNTER — Other Ambulatory Visit: Payer: BC Managed Care – PPO

## 2021-04-29 ENCOUNTER — Other Ambulatory Visit: Payer: Self-pay | Admitting: Adult Health

## 2021-04-29 ENCOUNTER — Ambulatory Visit
Admission: RE | Admit: 2021-04-29 | Discharge: 2021-04-29 | Disposition: A | Discharge status: Home / Self Care | Payer: BC Managed Care – PPO | Source: Ambulatory Visit | Attending: Adult Health | Admitting: Adult Health

## 2021-04-29 ENCOUNTER — Telehealth: Payer: Self-pay | Admitting: Neurology

## 2021-04-29 DIAGNOSIS — I671 Cerebral aneurysm, nonruptured: Secondary | ICD-10-CM

## 2021-04-29 NOTE — Telephone Encounter (Signed)
Received a page from [facility] regarding patient CT order.  I reviewed the last note from Frann Rider, NP, she wanted to repeat repeat CTA done around December.  There was also also an order for CTA angio head with and without contrast entered today by Frann Rider around 1:15 PM.  Call [facility] [place] [facility] imaging, calls are going straight to voicemail; I left a message asking them to reach me back if the matter is not resolved.   Alric Ran, MD

## 2021-05-02 ENCOUNTER — Other Ambulatory Visit (HOSPITAL_COMMUNITY): Payer: Self-pay

## 2021-05-02 ENCOUNTER — Other Ambulatory Visit: Payer: BC Managed Care – PPO

## 2021-05-02 NOTE — Telephone Encounter (Addendum)
I called Daniel Griffin and we discussed this message. She sts clarification was needed on if CTA of head and neck are due now or in Dec. I advised both are due in December. She verbalized understandings and sts no further questions/action needed at this time.

## 2021-05-02 NOTE — Addendum Note (Signed)
Addended by: Frann Rider L on: 05/02/2021 04:24 PM   Modules accepted: Orders

## 2021-05-02 NOTE — Telephone Encounter (Signed)
Can we please try to contact [facility] imaging regarding CTA. Dr. April Manson tried to call back but unable to speak with anyone. Not sure what the question is in regards to.  Verbal order placed by Sheralyn Boatman on 9/9 for CTA head.  Additional order placed today for CTA neck. Request to be completed in December 2022 for monitoring of cerebral aneurysm. Thank you.

## 2021-05-02 NOTE — Telephone Encounter (Deleted)
I called Daniel Griffin back with [facility] imaging. We clarified

## 2021-05-04 NOTE — Telephone Encounter (Signed)
CTA Neck BCBS auth: [id number] Approval Valid Through: 05/04/2021 - 06/02/2021

## 2021-05-05 NOTE — Progress Notes (Addendum)
COVID swab appointment: 05/11/21  COVID Vaccine Completed: No Date COVID Vaccine completed: Has received booster: COVID vaccine manufacturer: [employer]   Date of COVID positive in last 90 days: No  PCP - Mary-Margaret Hassell Done, [place] Cardiologist - N/a  Chest x-ray - N/a EKG - 06/22/20 Epic Stress Test - N/a ECHO - N/a Cardiac Cath - N/a Pacemaker/ICD device last checked: N/a Spinal Cord Stimulator: N/a  Sleep Study - N/a CPAP -   Fasting Blood Sugar - N/a Checks Blood Sugar _____ times a day  Blood Thinner Instructions: N/a Aspirin Instructions: Last Dose:  Activity level: Can go up a flight of stairs and perform activities of daily living without stopping and without symptoms of chest pain or shortness of breath. Trouble with stairs due to arthritis     Anesthesia review:   Patient denies shortness of breath, fever, cough and chest pain at PAT appointment   Patient verbalized understanding of instructions that were given to them at the PAT appointment. Patient was also instructed that they will need to review over the PAT instructions again at home before surgery.

## 2021-05-05 NOTE — Patient Instructions (Addendum)
DUE TO COVID-19 ONLY ONE VISITOR IS ALLOWED TO COME WITH YOU AND STAY IN THE WAITING ROOM ONLY DURING PRE OP AND PROCEDURE.   **NO VISITORS ARE ALLOWED IN THE SHORT STAY AREA OR RECOVERY ROOM!!**  IF YOU WILL BE ADMITTED INTO THE HOSPITAL YOU ARE ALLOWED ONLY TWO SUPPORT PEOPLE DURING VISITATION HOURS ONLY (10AM -8PM)   The support person(s) may change daily. The support person(s) must pass our screening, gel in and out, and wear a mask at all times, including in the patient's room. Patients must also wear a mask when staff or their support person are in the room.  No visitors under the age of 26. Any visitor under the age of 55 must be accompanied by an adult.    COVID SWAB TESTING MUST BE COMPLETED ON:  05/11/21 **MUST PRESENT COMPLETED FORM AT TESTING SITE**    [place] (backside of the building) Open 8am-3pm. No appointment needed. You are not required to quarantine, however you are required to wear a well-fitted mask when you are out and around people not in your household.  Hand Hygiene often Do NOT share personal items Notify your provider if you are in close contact with someone who has COVID or you develop fever 100.4 or greater, new onset of sneezing, cough, sore throat, shortness of breath or body aches.   Your procedure is scheduled on: 05/13/21   Report to [facility] Main  Entrance    Report to admitting at 7:25 AM   Call this number if you have problems the morning of surgery [phone]   Do not eat food :After Midnight.   May have liquids until 7:10 AM day of surgery  CLEAR LIQUID DIET  Foods Allowed                                                                     Foods Excluded  Water, Black Coffee and tea (no milk or creamer)          liquids that you cannot  Plain Jell-O in any flavor  (No red)                                    see through such as: Fruit ices (not with fruit pulp)                                             milk, soups, orange juice              Iced Popsicles (No red)                                                All solid food                                   Apple juices Sports  drinks like Gatorade (No red) Lightly seasoned clear broth or consume(fat free) Sugar      The day of surgery:  Drink ONE (1) Pre-Surgery Clear Ensure by 7:10 am the morning of surgery. Drink in one sitting. Do not sip.  This drink was given to you during your hospital  pre-op appointment visit. Nothing else to drink after completing the  Pre-Surgery Clear Ensure.          If you have questions, please contact your surgeon's office.     Oral Hygiene is also important to reduce your risk of infection.                                    Remember - BRUSH YOUR TEETH THE MORNING OF SURGERY WITH YOUR REGULAR TOOTHPASTE   Take these medicines the morning of surgery with A SIP OF WATER: Wellbutrin, Lexapro.             You may not have any metal on your body including jewelry, and body piercing             Do not wear lotions, powders, cologne, or deodorant              Men may shave face and neck.   Do not bring valuables to the hospital. [place].   Bring small overnight bag day of surgery.   Special Instructions: Bring a copy of your healthcare power of attorney and living will documents         the day of surgery if you haven't scanned them in before.   Please read over the following fact sheets you were given: IF YOU HAVE QUESTIONS ABOUT YOUR PRE OP INSTRUCTIONS PLEASE CALL [phone]- [place] - Preparing for Surgery Before surgery, you can play an important role.  Because skin is not sterile, your skin needs to be as free of germs as possible.  You can reduce the number of germs on your skin by washing with CHG (chlorahexidine gluconate) soap before surgery.  CHG is an antiseptic cleaner which kills germs and bonds with the skin to  continue killing germs even after washing. Please DO NOT use if you have an allergy to CHG or antibacterial soaps.  If your skin becomes reddened/irritated stop using the CHG and inform your nurse when you arrive at Short Stay. Do not shave (including legs and underarms) for at least 48 hours prior to the first CHG shower.  You may shave your face/neck.  Please follow these instructions carefully:  1.  Shower with CHG Soap the night before surgery and the  morning of surgery.  2.  If you choose to wash your hair, wash your hair first as usual with your normal  shampoo.  3.  After you shampoo, rinse your hair and body thoroughly to remove the shampoo.                             4.  Use CHG as you would any other liquid soap.  You can apply chg directly to the skin and wash.  Gently with a scrungie or clean washcloth.  5.  Apply the CHG Soap to your body ONLY FROM THE NECK DOWN.   Do  not use on face/ open                           Wound or open sores. Avoid contact with eyes, ears mouth and   genitals (private parts).                       Wash face,  Genitals (private parts) with your normal soap.             6.  Wash thoroughly, paying special attention to the area where your    surgery  will be performed.  7.  Thoroughly rinse your body with warm water from the neck down.  8.  DO NOT shower/wash with your normal soap after using and rinsing off the CHG Soap.                9.  Pat yourself dry with a clean towel.            10.  Wear clean pajamas.            11.  Place clean sheets on your bed the night of your first shower and do not  sleep with pets. Day of Surgery : Do not apply any lotions/deodorants the morning of surgery.  Please wear clean clothes to the hospital/surgery center.  FAILURE TO FOLLOW THESE INSTRUCTIONS MAY RESULT IN THE CANCELLATION OF YOUR SURGERY  PATIENT SIGNATURE_________________________________  NURSE  SIGNATURE__________________________________  ________________________________________________________________________   Adam Phenix  An incentive spirometer is a tool that can help keep your lungs clear and active. This tool measures how well you are filling your lungs with each breath. Taking long deep breaths may help reverse or decrease the chance of developing breathing (pulmonary) problems (especially infection) following: A long period of time when you are unable to move or be active. BEFORE THE PROCEDURE  If the spirometer includes an indicator to show your best effort, your nurse or respiratory therapist will set it to a desired goal. If possible, sit up straight or lean slightly forward. Try not to slouch. Hold the incentive spirometer in an upright position. INSTRUCTIONS FOR USE  Sit on the edge of your bed if possible, or sit up as far as you can in bed or on a chair. Hold the incentive spirometer in an upright position. Breathe out normally. Place the mouthpiece in your mouth and seal your lips tightly around it. Breathe in slowly and as deeply as possible, raising the piston or the ball toward the top of the column. Hold your breath for 3-5 seconds or for as long as possible. Allow the piston or ball to fall to the bottom of the column. Remove the mouthpiece from your mouth and breathe out normally. Rest for a few seconds and repeat Steps 1 through 7 at least 10 times every 1-2 hours when you are awake. Take your time and take a few normal breaths between deep breaths. The spirometer may include an indicator to show your best effort. Use the indicator as a goal to work toward during each repetition. After each set of 10 deep breaths, practice coughing to be sure your lungs are clear. If you have an incision (the cut made at the time of surgery), support your incision when coughing by placing a pillow or rolled up towels firmly against it. Once you are able to get out of  bed, walk around indoors and cough well. You may  stop using the incentive spirometer when instructed by your caregiver.  RISKS AND COMPLICATIONS Take your time so you do not get dizzy or light-headed. If you are in pain, you may need to take or ask for pain medication before doing incentive spirometry. It is harder to take a deep breath if you are having pain. AFTER USE Rest and breathe slowly and easily. It can be helpful to keep track of a log of your progress. Your caregiver can provide you with a simple table to help with this. If you are using the spirometer at home, follow these instructions: [place] IF:  You are having difficultly using the spirometer. You have trouble using the spirometer as often as instructed. Your pain medication is not giving enough relief while using the spirometer. You develop fever of 100.5 F (38.1 C) or higher. SEEK IMMEDIATE MEDICAL CARE IF:  You cough up bloody sputum that had not been present before. You develop fever of 102 F (38.9 C) or greater. You develop worsening pain at or near the incision site. MAKE SURE YOU:  Understand these instructions. Will watch your condition. Will get help right away if you are not doing well or get worse. Document Released: 12/18/2006 Document Revised: 10/30/2011 Document Reviewed: 02/18/2007 ExitCare Patient Information 2014 [employer].   ________________________________________________________________________ [facility]- Preparing for Total Shoulder Arthroplasty    Before surgery, you can play an important role. Because skin is not sterile, your skin needs to be as free of germs as possible. You can reduce the number of germs on your skin by using the following products. Benzoyl Peroxide Gel Reduces the number of germs present on the skin Applied twice a day to shoulder area starting two days before surgery    ==================================================================  Please follow  these instructions carefully:  BENZOYL PEROXIDE 5% GEL  Please do not use if you have an allergy to benzoyl peroxide.   If your skin becomes reddened/irritated stop using the benzoyl peroxide.  Starting two days before surgery, apply as follows: Apply benzoyl peroxide in the morning and at night. Apply after taking a shower. If you are not taking a shower clean entire shoulder front, back, and side along with the armpit with a clean wet washcloth.  Place a quarter-sized dollop on your shoulder and rub in thoroughly, making sure to cover the front, back, and side of your shoulder, along with the armpit.   2 days before ____ AM   ____ PM              1 day before ____ AM   ____ PM                         Do this twice a day for two days.  (Last application is the night before surgery, AFTER using the CHG soap as described below).  Do NOT apply benzoyl peroxide gel on the day of surgery.

## 2021-05-06 ENCOUNTER — Encounter (HOSPITAL_COMMUNITY)
Admission: RE | Admit: 2021-05-06 | Discharge: 2021-05-06 | Disposition: A | Discharge status: Home / Self Care | Payer: BC Managed Care – PPO | Source: Ambulatory Visit | Attending: Orthopedic Surgery | Admitting: Orthopedic Surgery

## 2021-05-06 ENCOUNTER — Other Ambulatory Visit: Payer: Self-pay

## 2021-05-06 ENCOUNTER — Encounter (HOSPITAL_COMMUNITY): Payer: Self-pay

## 2021-05-06 DIAGNOSIS — Z01812 Encounter for preprocedural laboratory examination: Secondary | ICD-10-CM | POA: Insufficient documentation

## 2021-05-06 HISTORY — DX: Unspecified dementia, unspecified severity, without behavioral disturbance, psychotic disturbance, mood disturbance, and anxiety: F03.90

## 2021-05-06 LAB — CBC
HCT: 41.3 % (ref 39.0–52.0)
Hemoglobin: 13.5 g/dL (ref 13.0–17.0)
MCH: 32.3 pg (ref 26.0–34.0)
MCHC: 32.7 g/dL (ref 30.0–36.0)
MCV: 98.8 fL (ref 80.0–100.0)
Platelets: 213 10*3/uL (ref 150–400)
RBC: 4.18 MIL/uL — ABNORMAL LOW (ref 4.22–5.81)
RDW: 12.9 % (ref 11.5–15.5)
WBC: 9.3 10*3/uL (ref 4.0–10.5)
nRBC: 0 % (ref 0.0–0.2)

## 2021-05-06 LAB — COMPREHENSIVE METABOLIC PANEL
ALT: 18 U/L (ref 0–44)
AST: 16 U/L (ref 15–41)
Albumin: 4.2 g/dL (ref 3.5–5.0)
Alkaline Phosphatase: 62 U/L (ref 38–126)
Anion gap: 5 (ref 5–15)
BUN: 13 mg/dL (ref 6–20)
CO2: 30 mmol/L (ref 22–32)
Calcium: 9.4 mg/dL (ref 8.9–10.3)
Chloride: 106 mmol/L (ref 98–111)
Creatinine, Ser: 0.7 mg/dL (ref 0.61–1.24)
GFR, Estimated: >60 mL/min (ref >60)
Glucose, Bld: 94 mg/dL (ref 70–99)
Potassium: 4.4 mmol/L (ref 3.5–5.1)
Sodium: 141 mmol/L (ref 135–145)
Total Bilirubin: 0.9 mg/dL (ref 0.3–1.2)
Total Protein: 7 g/dL (ref 6.5–8.1)

## 2021-05-06 LAB — SURGICAL PCR SCREEN
MRSA, PCR: NEGATIVE
Staphylococcus aureus: NEGATIVE

## 2021-05-11 ENCOUNTER — Other Ambulatory Visit: Payer: Self-pay | Admitting: Orthopedic Surgery

## 2021-05-11 LAB — SARS CORONAVIRUS 2 (TAT 6-24 HRS): SARS Coronavirus 2: NEGATIVE

## 2021-05-13 ENCOUNTER — Ambulatory Visit (HOSPITAL_COMMUNITY): Payer: BC Managed Care – PPO | Admitting: Certified Registered Nurse Anesthetist

## 2021-05-13 ENCOUNTER — Encounter (HOSPITAL_COMMUNITY): Payer: Self-pay | Admitting: Orthopedic Surgery

## 2021-05-13 ENCOUNTER — Encounter (HOSPITAL_COMMUNITY)
Admission: RE | Disposition: A | Discharge status: Home / Self Care | Payer: Self-pay | Source: Ambulatory Visit | Attending: Orthopedic Surgery

## 2021-05-13 ENCOUNTER — Observation Stay (HOSPITAL_COMMUNITY): Payer: BC Managed Care – PPO

## 2021-05-13 ENCOUNTER — Other Ambulatory Visit: Payer: Self-pay

## 2021-05-13 ENCOUNTER — Observation Stay (HOSPITAL_COMMUNITY)
Admission: RE | Admit: 2021-05-13 | Discharge: 2021-05-14 | Disposition: A | Discharge status: Home / Self Care | Payer: BC Managed Care – PPO | Source: Ambulatory Visit | Attending: Orthopedic Surgery | Admitting: Orthopedic Surgery

## 2021-05-13 DIAGNOSIS — M12812 Other specific arthropathies, not elsewhere classified, left shoulder: Secondary | ICD-10-CM | POA: Diagnosis not present

## 2021-05-13 DIAGNOSIS — M75102 Unspecified rotator cuff tear or rupture of left shoulder, not specified as traumatic: Secondary | ICD-10-CM | POA: Diagnosis not present

## 2021-05-13 DIAGNOSIS — D72819 Decreased white blood cell count, unspecified: Secondary | ICD-10-CM | POA: Diagnosis not present

## 2021-05-13 DIAGNOSIS — Z96642 Presence of left artificial hip joint: Secondary | ICD-10-CM | POA: Diagnosis not present

## 2021-05-13 DIAGNOSIS — G8918 Other acute postprocedural pain: Secondary | ICD-10-CM | POA: Diagnosis not present

## 2021-05-13 DIAGNOSIS — Z96612 Presence of left artificial shoulder joint: Secondary | ICD-10-CM | POA: Diagnosis not present

## 2021-05-13 DIAGNOSIS — Z471 Aftercare following joint replacement surgery: Secondary | ICD-10-CM | POA: Diagnosis not present

## 2021-05-13 DIAGNOSIS — M19012 Primary osteoarthritis, left shoulder: Secondary | ICD-10-CM | POA: Diagnosis not present

## 2021-05-13 HISTORY — PX: REVERSE SHOULDER ARTHROPLASTY: SHX5054

## 2021-05-13 SURGERY — ARTHROPLASTY, SHOULDER, TOTAL, REVERSE
Anesthesia: General | Site: Shoulder | Laterality: Left

## 2021-05-13 MED ORDER — CEFAZOLIN SODIUM-DEXTROSE 2-4 GM/100ML-% IV SOLN
2.0000 g | INTRAVENOUS | Status: AC
  Administered 2021-05-13: 2 g via INTRAVENOUS
  Filled 2021-05-13: qty 100

## 2021-05-13 MED ORDER — FENTANYL CITRATE (PF) 100 MCG/2ML IJ SOLN
INTRAMUSCULAR | Status: DC | PRN
  Administered 2021-05-13 (×2): 50 ug via INTRAVENOUS

## 2021-05-13 MED ORDER — BUPIVACAINE-EPINEPHRINE (PF) 0.25% -1:200000 IJ SOLN
INTRAMUSCULAR | Status: DC | PRN
  Administered 2021-05-13: 12 mL

## 2021-05-13 MED ORDER — METOCLOPRAMIDE HCL 5 MG/ML IJ SOLN: 5.0000 mg | Freq: Three times a day (TID) | INTRAMUSCULAR | Status: DC | PRN

## 2021-05-13 MED ORDER — ORAL CARE MOUTH RINSE: 15.0000 mL | Freq: Once | OROMUCOSAL | Status: AC

## 2021-05-13 MED ORDER — MENTHOL 3 MG MT LOZG: 1.0000 | LOZENGE | OROMUCOSAL | Status: DC | PRN

## 2021-05-13 MED ORDER — ONDANSETRON HCL 4 MG/2ML IJ SOLN
INTRAMUSCULAR | Status: DC | PRN
  Administered 2021-05-13: 4 mg via INTRAVENOUS

## 2021-05-13 MED ORDER — ROCURONIUM BROMIDE 10 MG/ML (PF) SYRINGE
PREFILLED_SYRINGE | INTRAVENOUS | Status: AC
  Filled 2021-05-13: qty 10

## 2021-05-13 MED ORDER — ACETAMINOPHEN 325 MG PO TABS: 325.0000 mg | ORAL_TABLET | Freq: Four times a day (QID) | ORAL | Status: DC | PRN

## 2021-05-13 MED ORDER — ONDANSETRON HCL 4 MG/2ML IJ SOLN: 4.0000 mg | Freq: Four times a day (QID) | INTRAMUSCULAR | Status: DC | PRN

## 2021-05-13 MED ORDER — BISACODYL 10 MG RE SUPP: 10.0000 mg | Freq: Every day | RECTAL | Status: DC | PRN

## 2021-05-13 MED ORDER — LACTATED RINGERS IV SOLN: INTRAVENOUS | Status: DC

## 2021-05-13 MED ORDER — METHOCARBAMOL 500 MG PO TABS: 500.0000 mg | ORAL_TABLET | Freq: Four times a day (QID) | ORAL | Status: DC | PRN

## 2021-05-13 MED ORDER — DEXAMETHASONE SODIUM PHOSPHATE 10 MG/ML IJ SOLN
INTRAMUSCULAR | Status: DC | PRN
  Administered 2021-05-13: 10 mg via INTRAVENOUS

## 2021-05-13 MED ORDER — ONDANSETRON HCL 4 MG PO TABS: 4.0000 mg | ORAL_TABLET | Freq: Every day | ORAL | 1 refills | Status: DC | PRN

## 2021-05-13 MED ORDER — FENTANYL CITRATE (PF) 100 MCG/2ML IJ SOLN
INTRAMUSCULAR | Status: AC
  Filled 2021-05-13: qty 2

## 2021-05-13 MED ORDER — OXYCODONE HCL 5 MG/5ML PO SOLN: 5.0000 mg | Freq: Once | ORAL | Status: AC | PRN

## 2021-05-13 MED ORDER — DOCUSATE SODIUM 100 MG PO CAPS
100.0000 mg | ORAL_CAPSULE | Freq: Two times a day (BID) | ORAL | Status: DC
  Administered 2021-05-13 – 2021-05-14 (×2): 100 mg via ORAL
  Filled 2021-05-13 (×2): qty 1

## 2021-05-13 MED ORDER — BUPIVACAINE-EPINEPHRINE (PF) 0.5% -1:200000 IJ SOLN
INTRAMUSCULAR | Status: DC | PRN
  Administered 2021-05-13: 15 mL via PERINEURAL

## 2021-05-13 MED ORDER — OXYCODONE HCL 5 MG PO TABS
5.0000 mg | ORAL_TABLET | ORAL | Status: DC | PRN
  Administered 2021-05-13 – 2021-05-14 (×3): 5 mg via ORAL
  Filled 2021-05-13 (×3): qty 1

## 2021-05-13 MED ORDER — OXYCODONE-ACETAMINOPHEN 5-325 MG PO TABS: 1.0000 | ORAL_TABLET | ORAL | 0 refills | Status: DC | PRN

## 2021-05-13 MED ORDER — SODIUM CHLORIDE 0.9 % IV SOLN: INTRAVENOUS | Status: DC

## 2021-05-13 MED ORDER — PROPOFOL 500 MG/50ML IV EMUL
INTRAVENOUS | Status: AC
  Filled 2021-05-13: qty 50

## 2021-05-13 MED ORDER — PHENOL 1.4 % MT LIQD: 1.0000 | OROMUCOSAL | Status: DC | PRN

## 2021-05-13 MED ORDER — BUPIVACAINE-EPINEPHRINE (PF) 0.25% -1:200000 IJ SOLN
INTRAMUSCULAR | Status: AC
  Filled 2021-05-13: qty 30

## 2021-05-13 MED ORDER — 0.9 % SODIUM CHLORIDE (POUR BTL) OPTIME
TOPICAL | Status: DC | PRN
  Administered 2021-05-13: 1000 mL

## 2021-05-13 MED ORDER — METHOCARBAMOL 500 MG IVPB - SIMPLE MED
500.0000 mg | Freq: Four times a day (QID) | INTRAVENOUS | Status: DC | PRN
  Filled 2021-05-13: qty 50

## 2021-05-13 MED ORDER — ONDANSETRON HCL 4 MG PO TABS: 4.0000 mg | ORAL_TABLET | Freq: Four times a day (QID) | ORAL | Status: DC | PRN

## 2021-05-13 MED ORDER — FENTANYL CITRATE PF 50 MCG/ML IJ SOSY: 25.0000 ug | PREFILLED_SYRINGE | INTRAMUSCULAR | Status: DC | PRN

## 2021-05-13 MED ORDER — BUPROPION HCL ER (XL) 150 MG PO TB24
150.0000 mg | ORAL_TABLET | Freq: Every day | ORAL | Status: DC
  Administered 2021-05-14: 150 mg via ORAL
  Filled 2021-05-13: qty 1

## 2021-05-13 MED ORDER — DEXAMETHASONE SODIUM PHOSPHATE 10 MG/ML IJ SOLN
INTRAMUSCULAR | Status: AC
  Filled 2021-05-13: qty 1

## 2021-05-13 MED ORDER — CHLORHEXIDINE GLUCONATE 0.12 % MT SOLN
15.0000 mL | Freq: Once | OROMUCOSAL | Status: AC
  Administered 2021-05-13: 15 mL via OROMUCOSAL

## 2021-05-13 MED ORDER — MIDAZOLAM HCL 2 MG/2ML IJ SOLN
1.0000 mg | INTRAMUSCULAR | Status: AC
  Administered 2021-05-13: 1 mg via INTRAVENOUS
  Filled 2021-05-13: qty 2

## 2021-05-13 MED ORDER — HYDROMORPHONE HCL 1 MG/ML IJ SOLN: 0.5000 mg | INTRAMUSCULAR | Status: DC | PRN

## 2021-05-13 MED ORDER — BUPIVACAINE LIPOSOME 1.3 % IJ SUSP
INTRAMUSCULAR | Status: DC | PRN
  Administered 2021-05-13: 10 mL via PERINEURAL

## 2021-05-13 MED ORDER — CEFAZOLIN SODIUM-DEXTROSE 2-4 GM/100ML-% IV SOLN
2.0000 g | Freq: Four times a day (QID) | INTRAVENOUS | Status: AC
  Administered 2021-05-13 – 2021-05-14 (×3): 2 g via INTRAVENOUS
  Filled 2021-05-13 (×3): qty 100

## 2021-05-13 MED ORDER — OXYCODONE HCL 5 MG PO TABS
5.0000 mg | ORAL_TABLET | Freq: Once | ORAL | Status: AC | PRN
  Administered 2021-05-13: 5 mg via ORAL

## 2021-05-13 MED ORDER — OXYCODONE HCL 5 MG PO TABS
ORAL_TABLET | ORAL | Status: AC
  Filled 2021-05-13: qty 1

## 2021-05-13 MED ORDER — ONDANSETRON HCL 4 MG/2ML IJ SOLN
INTRAMUSCULAR | Status: AC
  Filled 2021-05-13: qty 2

## 2021-05-13 MED ORDER — METHOCARBAMOL 500 MG PO TABS: 500.0000 mg | ORAL_TABLET | Freq: Four times a day (QID) | ORAL | 1 refills | Status: DC | PRN

## 2021-05-13 MED ORDER — ROCURONIUM BROMIDE 10 MG/ML (PF) SYRINGE
PREFILLED_SYRINGE | INTRAVENOUS | Status: DC | PRN
  Administered 2021-05-13: 60 mg via INTRAVENOUS

## 2021-05-13 MED ORDER — SUGAMMADEX SODIUM 200 MG/2ML IV SOLN
INTRAVENOUS | Status: DC | PRN
  Administered 2021-05-13: 200 mg via INTRAVENOUS

## 2021-05-13 MED ORDER — LIDOCAINE HCL (PF) 2 % IJ SOLN
INTRAMUSCULAR | Status: AC
  Filled 2021-05-13: qty 5

## 2021-05-13 MED ORDER — METOCLOPRAMIDE HCL 5 MG PO TABS: 5.0000 mg | ORAL_TABLET | Freq: Three times a day (TID) | ORAL | Status: DC | PRN

## 2021-05-13 MED ORDER — STERILE WATER FOR IRRIGATION IR SOLN
Status: DC | PRN
  Administered 2021-05-13: 2000 mL

## 2021-05-13 MED ORDER — ESCITALOPRAM OXALATE 10 MG PO TABS
10.0000 mg | ORAL_TABLET | Freq: Every day | ORAL | Status: DC
  Administered 2021-05-14: 10 mg via ORAL
  Filled 2021-05-13: qty 1

## 2021-05-13 MED ORDER — PHENYLEPHRINE HCL (PRESSORS) 10 MG/ML IV SOLN
INTRAVENOUS | Status: AC
  Filled 2021-05-13: qty 2

## 2021-05-13 MED ORDER — FENTANYL CITRATE PF 50 MCG/ML IJ SOSY
50.0000 ug | PREFILLED_SYRINGE | INTRAMUSCULAR | Status: AC
  Administered 2021-05-13: 50 ug via INTRAVENOUS
  Filled 2021-05-13: qty 2

## 2021-05-13 MED ORDER — PROPOFOL 10 MG/ML IV BOLUS
INTRAVENOUS | Status: DC | PRN
  Administered 2021-05-13: 150 mg via INTRAVENOUS
  Administered 2021-05-13: 50 mg via INTRAVENOUS

## 2021-05-13 MED ORDER — POLYETHYLENE GLYCOL 3350 17 G PO PACK: 17.0000 g | PACK | Freq: Every day | ORAL | Status: DC | PRN

## 2021-05-13 MED ORDER — LIDOCAINE 2% (20 MG/ML) 5 ML SYRINGE
INTRAMUSCULAR | Status: DC | PRN
  Administered 2021-05-13: 40 mg via INTRAVENOUS

## 2021-05-13 SURGICAL SUPPLY — 68 items
BAG COUNTER SPONGE SURGICOUNT (BAG) IMPLANT
BAG ZIPLOCK 12X15 (MISCELLANEOUS) IMPLANT
BIT DRILL 1.6MX128 (BIT) IMPLANT
BIT DRILL 170X2.5X (BIT) ×1 IMPLANT
BIT DRL 170X2.5X (BIT) ×1
BLADE SAG 18X100X1.27 (BLADE) ×2 IMPLANT
COVER BACK TABLE 60X90IN (DRAPES) ×2 IMPLANT
COVER SURGICAL LIGHT HANDLE (MISCELLANEOUS) ×2 IMPLANT
CUP HUMERAL 42 PLUS 3 (Orthopedic Implant) ×2 IMPLANT
DECANTER SPIKE VIAL GLASS SM (MISCELLANEOUS) ×2 IMPLANT
DRAPE INCISE IOBAN 66X45 STRL (DRAPES) ×2 IMPLANT
DRAPE ORTHO SPLIT 77X108 STRL (DRAPES) ×4
DRAPE SHEET LG 3/4 BI-LAMINATE (DRAPES) ×2 IMPLANT
DRAPE SURG ORHT 6 SPLT 77X108 (DRAPES) ×2 IMPLANT
DRAPE TOP 10253 STERILE (DRAPES) ×2 IMPLANT
DRAPE U-SHAPE 47X51 STRL (DRAPES) ×2 IMPLANT
DRILL 2.5 (BIT) ×2
DRSG ADAPTIC 3X8 NADH LF (GAUZE/BANDAGES/DRESSINGS) ×2 IMPLANT
DRSG PAD ABDOMINAL 8X10 ST (GAUZE/BANDAGES/DRESSINGS) ×2 IMPLANT
DURAPREP 26ML APPLICATOR (WOUND CARE) ×2 IMPLANT
ELECT BLADE TIP CTD 4 INCH (ELECTRODE) ×2 IMPLANT
ELECT NEEDLE TIP 2.8 STRL (NEEDLE) ×2 IMPLANT
ELECT REM PT RETURN 15FT ADLT (MISCELLANEOUS) ×2 IMPLANT
EPIPHSYSI CENT SZ 2 (Orthopedic Implant) ×2 IMPLANT
EPIPHYSIS CENT SZ 2 (Orthopedic Implant) ×1 IMPLANT
FACESHIELD WRAPAROUND (MASK) ×2 IMPLANT
GAUZE SPONGE 4X4 12PLY STRL (GAUZE/BANDAGES/DRESSINGS) ×2 IMPLANT
GLENOSPHERE XTEND LAT 42+0 STD (Miscellaneous) ×2 IMPLANT
GLOVE SURG ORTHO LTX SZ7.5 (GLOVE) ×2 IMPLANT
GLOVE SURG ORTHO LTX SZ8.5 (GLOVE) ×2 IMPLANT
GLOVE SURG UNDER POLY LF SZ7.5 (GLOVE) ×2 IMPLANT
GLOVE SURG UNDER POLY LF SZ8.5 (GLOVE) ×2 IMPLANT
GOWN STRL REUS W/TWL XL LVL3 (GOWN DISPOSABLE) ×4 IMPLANT
KIT BASIN OR (CUSTOM PROCEDURE TRAY) ×2 IMPLANT
KIT TURNOVER KIT A (KITS) ×2 IMPLANT
MANIFOLD NEPTUNE II (INSTRUMENTS) ×2 IMPLANT
METAGLENE DELTA EXTEND (Trauma) ×1 IMPLANT
METAGLENE DXTEND (Trauma) ×2 IMPLANT
NEEDLE MAYO CATGUT SZ4 (NEEDLE) IMPLANT
NS IRRIG 1000ML POUR BTL (IV SOLUTION) ×2 IMPLANT
PACK SHOULDER (CUSTOM PROCEDURE TRAY) ×2 IMPLANT
PIN GUIDE 1.2 (PIN) ×2 IMPLANT
PIN GUIDE GLENOPHERE 1.5MX300M (PIN) ×2 IMPLANT
PIN METAGLENE 2.5 (PIN) ×2 IMPLANT
PROTECTOR NERVE ULNAR (MISCELLANEOUS) ×2 IMPLANT
RESTRAINT HEAD UNIVERSAL NS (MISCELLANEOUS) ×2 IMPLANT
SCREW 4.5X24MM (Screw) ×2 IMPLANT
SCREW 48L (Screw) ×2 IMPLANT
SCREW BN 24X4.5XLCK STRL (Screw) ×1 IMPLANT
SCREW LOCK 42 (Screw) ×2 IMPLANT
SLING ARM FOAM STRAP LRG (SOFTGOODS) IMPLANT
SMARTMIX MINI TOWER (MISCELLANEOUS)
SPONGE T-LAP 4X18 ~~LOC~~+RFID (SPONGE) IMPLANT
STEM 12 HA (Stem) ×2 IMPLANT
STRIP CLOSURE SKIN 1/2X4 (GAUZE/BANDAGES/DRESSINGS) ×2 IMPLANT
SUCTION FRAZIER HANDLE 10FR (MISCELLANEOUS) ×2
SUCTION TUBE FRAZIER 10FR DISP (MISCELLANEOUS) ×1 IMPLANT
SUT FIBERWIRE #2 38 T-5 BLUE (SUTURE) ×4
SUT MNCRL AB 4-0 PS2 18 (SUTURE) ×2 IMPLANT
SUT VIC AB 0 CT1 36 (SUTURE) ×4 IMPLANT
SUT VIC AB 0 CT2 27 (SUTURE) ×2 IMPLANT
SUT VIC AB 2-0 CT1 27 (SUTURE) ×2
SUT VIC AB 2-0 CT1 TAPERPNT 27 (SUTURE) ×1 IMPLANT
SUTURE FIBERWR #2 38 T-5 BLUE (SUTURE) ×2 IMPLANT
TAPE CLOTH SURG 6X10 WHT LF (GAUZE/BANDAGES/DRESSINGS) ×2 IMPLANT
TAPE STRIPS DRAPE STRL (GAUZE/BANDAGES/DRESSINGS) ×2 IMPLANT
TOWEL OR 17X26 10 PK STRL BLUE (TOWEL DISPOSABLE) ×2 IMPLANT
TOWER SMARTMIX MINI (MISCELLANEOUS) IMPLANT

## 2021-05-13 NOTE — Interval H&P Note (Signed)
History and Physical Interval Note:  05/13/2021 9:19 AM  Daniel Griffin  has presented today for surgery, with the diagnosis of left shoulder cuff arthropathy.  The various methods of treatment have been discussed with the patient and family. After consideration of risks, benefits and other options for treatment, the patient has consented to  Procedure(s) with comments: REVERSE SHOULDER ARTHROPLASTY (Left) - with ISB as a surgical intervention.  The patient's history has been reviewed, patient examined, no change in status, stable for surgery.  I have reviewed the patient's chart and labs.  Questions were answered to the patient's satisfaction.     Augustin Schooling

## 2021-05-13 NOTE — Anesthesia Postprocedure Evaluation (Signed)
Anesthesia Post Note  Patient: Daniel Griffin  Procedure(s) Performed: REVERSE SHOULDER ARTHROPLASTY (Left: Shoulder)     Patient location during evaluation: PACU Anesthesia Type: General and Regional Level of consciousness: awake and alert Pain management: pain level controlled Vital Signs Assessment: post-procedure vital signs reviewed and stable Respiratory status: spontaneous breathing, nonlabored ventilation, respiratory function stable and patient connected to nasal cannula oxygen Cardiovascular status: blood pressure returned to baseline and stable Postop Assessment: no apparent nausea or vomiting Anesthetic complications: no   No notable events documented.  Last Vitals:  Vitals:   05/13/21 1400 05/13/21 1405  BP: (!) 142/86 136/71  Pulse: (!) 57 (!) 59  Resp: (!) 8 11  Temp:  36.8 C  SpO2: 99% 99%    Last Pain:  Vitals:   05/13/21 1530  TempSrc:   PainSc: 5                  Chanay Nugent S

## 2021-05-13 NOTE — Op Note (Signed)
NAME: Daniel Griffin, Daniel Griffin MEDICAL RECORD NO: [id number] ACCOUNT NO: [id number] DATE OF BIRTH: 09-16-1960 FACILITY: [id number] PHYSICIAN: Doran Heater. Veverly Fells, MD  Operative Report   DATE OF PROCEDURE: 05/13/2021  PREOPERATIVE DIAGNOSIS:  Left shoulder rotator cuff tear arthropathy.  POSTOPERATIVE DIAGNOSIS:  Left shoulder rotator cuff tear arthropathy.  PROCEDURE PERFORMED:  Left reverse total shoulder replacement using DePuy Delta Xtend prosthesis with no subscap repair.  ATTENDING SURGEON:  Esmond Plants, MD  ASSISTANT:  Darol Destine, [place], who was scrubbed during the entire procedure, and necessary for satisfactory completion of surgery.  ANESTHESIA:  General anesthesia was used plus interscalene block.  ESTIMATED BLOOD LOSS:  150 mL.  FLUID REPLACEMENT:  1500 mL crystalloid.  Instrument count was correct.  There were no complications.  Perioperative antibiotics were given.  INDICATIONS:  The patient is a 60 year old male who presents with a history of worsening left shoulder pain and dysfunction due to end-stage arthritis and rotator cuff tear arthropathy.  The patient has complete subscap insufficiency and really has poor  function with his shoulder and despite conservative management, desires operative treatment to restore function and eliminate pain.  Informed consent obtained.  DESCRIPTION OF PROCEDURE:  After an adequate level of anesthesia was achieved, the patient was positioned in the modified beach chair position.  Left shoulder correctly identified and sterilely prepped and draped in the usual manner.  A timeout was  called, verifying correct patient, correct site.  We entered the patient's shoulder using a standard deltopectoral approach, starting at the coracoid process, extending down to the anterior humerus using a 10 blade scalpel, dissection down through  subcutaneous tissues using Bovie.  We identified the cephalic vein and took that laterally with the  deltoid.  Pectoralis was taken medially and the conjoined tendon was retracted medially.  We identified the biceps tendon and tenodesed into the  pectoralis tendon with 0 Vicryl figure-of-eight x2.  We then released the subscap remnant which was not repairable off the lesser tuberosity, really more off the inferior neck of the humerus, we did a capsular release as well.  We tagged the tissue for  retraction and protection of axillary nerve.  We then extended the shoulder, delivering the humeral head out of the wound, which was devoid of any tendon or cartilage.  We entered the proximal humerus with a 6 mm reamer and reamed up to a size 12.  We  then placed our T-handle 12 mm intramedullary guide and resected the head at 30 degrees of retroversion with the oscillating saw.  We removed the osteophytes on the humeral head with a rongeur.  We then subluxed the humerus posteriorly, gaining good  exposure of the glenoid face.  We removed the capsule and labrum and the biceps stump.  We then found our center point for our guide pin.  We placed our guide pin and then reamed for the metaglene baseplate with the appropriate reamer, we then did our  peripheral hand reaming and then drilled out our central peg hole and impacted the metaglene in position, centered low on the inferior scapula and glenoid.  At this point, we placed a 48 screw inferiorly, a 42 screw at the base of the coracoid and 24  locked screw anteriorly.  We locked all of her screws and then placed a 42 standard glenosphere onto the baseplate and secured it.  I did a finger sweep to make sure we had no soft tissue caught up in that.  We then finished our  preparation on the  humeral side, reaming for the 2 left metaphysis.  We then placed the 12 stem with a 2 left, set on the 0 setting and placed in 30 degrees of retroversion and impacted that in position. We then reduced the shoulder with a 42+3 poly and we were happy with  our soft tissue balancing,  range of motion and tensioning.  We removed all our components.  We irrigated thoroughly, used available bone graft from the humeral head and impaction grafted the stem, it was a press-fit HA coated 12 stem and HA 2 left  metaphyseal component and placed that in 30 degrees of retroversion and we had really good stem security with that bone grafting.  We then took a 42+3 poly and impacted that on the humeral tray, reduced the shoulder and had nice tensioning, appropriate  conjoint tensioning, no gapping with external or internal rotation or with distal pull.  We then irrigated thoroughly, removed our traction sutures and then repaired the deltopectoral interval with 0 Vicryl suture followed by 2-0 Vicryl for subcutaneous  closure and 4-0 Monocryl for skin.  Steri-Strips were applied followed by sterile dressing.  The patient tolerated surgery well.   SHW D: 05/13/2021 12:24:16 pm T: 05/13/2021 7:52:00 pm  JOB: [id number]/ [id number]

## 2021-05-13 NOTE — Transfer of Care (Signed)
Immediate Anesthesia Transfer of Care Note  Patient: Daniel Griffin  Procedure(s) Performed: REVERSE SHOULDER ARTHROPLASTY (Left: Shoulder)  Patient Location: PACU  Anesthesia Type:GA combined with regional for post-op pain  Level of Consciousness: awake, alert  and oriented  Airway & Oxygen Therapy: Patient Spontanous Breathing and Patient connected to face mask oxygen  Post-op Assessment: Report given to RN and Post -op Vital signs reviewed and stable  Post vital signs: Reviewed and stable  Last Vitals:  Vitals Value Taken Time  BP 147/72 05/13/21 1208  Temp    Pulse 60 05/13/21 1211  Resp 23 05/13/21 1211  SpO2 100 % 05/13/21 1211  Vitals shown include unvalidated device data.  Last Pain:  Vitals:   05/13/21 0910  TempSrc:   PainSc: Asleep         Complications: No notable events documented.

## 2021-05-13 NOTE — Anesthesia Procedure Notes (Signed)
Anesthesia Regional Block: Interscalene brachial plexus block   Pre-Anesthetic Checklist: , timeout performed,  Correct Patient, Correct Site, Correct Laterality,  Correct Procedure, Correct Position, site marked,  Risks and benefits discussed,  Surgical consent,  Pre-op evaluation,  At surgeon's request and post-op pain management  Laterality: Left  Prep: chloraprep       Needles:  Injection technique: Single-shot  Needle Type: Echogenic Stimulator Needle     Needle Length: 5cm  Needle Gauge: 22     Additional Needles:   Procedures:, nerve stimulator,,,,,     Nerve Stimulator or Paresthesia:  Response: biceps flexion, 0.45 mA  Additional Responses:   Narrative:  Start time: 05/13/2021 8:35 AM End time: 05/13/2021 8:45 AM Injection made incrementally with aspirations every 5 mL.  Performed by: Personally  Anesthesiologist: Albertha Ghee, MD  Additional Notes: Functioning IV was confirmed and monitors were applied.  A 30mm 22ga Arrow echogenic stimulator needle was used. Sterile prep and drape,hand hygiene and sterile gloves were used.  Negative aspiration and negative test dose prior to incremental administration of local anesthetic. The patient tolerated the procedure well.  Ultrasound guidance: relevent anatomy identified, needle position confirmed, local anesthetic spread visualized around nerve(s), vascular puncture avoided.  Image printed for medical record.

## 2021-05-13 NOTE — Brief Op Note (Signed)
05/13/2021  12:18 PM  PATIENT:  Daniel Griffin  60 y.o. male  PRE-OPERATIVE DIAGNOSIS:  left shoulder cuff tear arthropathy  POST-OPERATIVE DIAGNOSIS:  left shoulder cuff tear arthropathy  PROCEDURE:  Procedure(s) with comments: REVERSE SHOULDER ARTHROPLASTY (Left) - with ISB DePuy Delta Xtend (30 IR) No subscap repair  SURGEON:  Surgeon(s) and Role:    Netta Cedars, MD - Primary  PHYSICIAN ASSISTANT:   ASSISTANTS: [place] Bruns, PA-C   ANESTHESIA:   regional and general  EBL:  100 mL   BLOOD ADMINISTERED:none  DRAINS: none   LOCAL MEDICATIONS USED:  MARCAINE     SPECIMEN:  No Specimen  DISPOSITION OF SPECIMEN:  N/A  COUNTS:  YES  TOURNIQUET:  * No tourniquets in log *  DICTATION: .Other Dictation: Dictation Number [id number]  PLAN OF CARE: Admit for overnight observation  PATIENT DISPOSITION:  PACU - hemodynamically stable.   Delay start of Pharmacological VTE agent (>24hrs) due to surgical blood loss or risk of bleeding: not applicable

## 2021-05-13 NOTE — Anesthesia Preprocedure Evaluation (Signed)
Anesthesia Evaluation  Patient identified by MRN, date of birth, ID band Patient awake    Reviewed: Allergy & Precautions, H&P , NPO status , Patient's Chart, lab work & pertinent test results  Airway Mallampati: II   Neck ROM: full    Dental   Pulmonary neg pulmonary ROS,    breath sounds clear to auscultation       Cardiovascular negative cardio ROS   Rhythm:regular Rate:Normal     Neuro/Psych Seizures -,  PSYCHIATRIC DISORDERS Dementia    GI/Hepatic   Endo/Other    Renal/GU      Musculoskeletal  (+) Arthritis ,   Abdominal   Peds  Hematology   Anesthesia Other Findings   Reproductive/Obstetrics                             Anesthesia Physical Anesthesia Plan  ASA: 2  Anesthesia Plan: General   Post-op Pain Management:  Regional for Post-op pain   Induction: Intravenous  PONV Risk Score and Plan: 2 and Ondansetron, Dexamethasone and Treatment may vary due to age or medical condition  Airway Management Planned: Oral ETT  Additional Equipment:   Intra-op Plan:   Post-operative Plan: Extubation in OR  Informed Consent: I have reviewed the patients History and Physical, chart, labs and discussed the procedure including the risks, benefits and alternatives for the proposed anesthesia with the patient or authorized representative who has indicated his/her understanding and acceptance.     Dental advisory given  Plan Discussed with: Anesthesiologist, Surgeon and CRNA  Anesthesia Plan Comments:         Anesthesia Quick Evaluation

## 2021-05-13 NOTE — Discharge Instructions (Signed)
Ice to the shoulder constantly.  Keep the incision covered and clean and dry for one week, then ok to get it wet in the shower. ° °Do exercise as instructed several times per day. ° °DO NOT reach behind your back or push up out of a chair with the operative arm. ° °Use a sling while you are up and around for comfort, may remove while seated.  Keep pillow propped behind the operative elbow. ° °Follow up with Dr Ewing Fandino in two weeks in the office, call [phone] for appt °

## 2021-05-13 NOTE — Progress Notes (Signed)
Time out completed.  AssistedDr. Marcie Bal with left, ultrasound guided, interscalene  block. Side rails up, monitors on throughout procedure. See vital signs in flow sheet. Tolerated Procedure well.

## 2021-05-13 NOTE — Plan of Care (Signed)
  Problem: Education: Goal: Knowledge of General Education information will improve Description: Including pain rating scale, medication(s)/side effects and non-pharmacologic comfort measures Outcome: Progressing   Problem: Pain Managment: Goal: General experience of comfort will improve Outcome: Progressing   Problem: Safety: Goal: Ability to remain free from injury will improve Outcome: Progressing   

## 2021-05-13 NOTE — Anesthesia Procedure Notes (Signed)
Procedure Name: Intubation Date/Time: 05/13/2021 10:25 AM Performed by: Genelle Bal, CRNA Pre-anesthesia Checklist: Patient identified, Emergency Drugs available, Suction available and Patient being monitored Patient Re-evaluated:Patient Re-evaluated prior to induction Oxygen Delivery Method: Circle system utilized Preoxygenation: Pre-oxygenation with 100% oxygen Induction Type: IV induction Ventilation: Mask ventilation without difficulty Laryngoscope Size: Miller and 2 Grade View: Grade I Tube type: Oral Tube size: 7.5 mm Number of attempts: 1 Airway Equipment and Method: Stylet and Oral airway Placement Confirmation: ETT inserted through vocal cords under direct vision, positive ETCO2 and breath sounds checked- equal and bilateral Secured at: 23 cm Tube secured with: Tape Dental Injury: Teeth and Oropharynx as per pre-operative assessment

## 2021-05-14 DIAGNOSIS — M75102 Unspecified rotator cuff tear or rupture of left shoulder, not specified as traumatic: Secondary | ICD-10-CM | POA: Diagnosis not present

## 2021-05-14 DIAGNOSIS — M12812 Other specific arthropathies, not elsewhere classified, left shoulder: Secondary | ICD-10-CM | POA: Diagnosis not present

## 2021-05-14 LAB — HEMOGLOBIN AND HEMATOCRIT, BLOOD
HCT: 35.8 % — ABNORMAL LOW (ref 39.0–52.0)
Hemoglobin: 12.1 g/dL — ABNORMAL LOW (ref 13.0–17.0)

## 2021-05-14 LAB — BASIC METABOLIC PANEL
Anion gap: 7 (ref 5–15)
BUN: 12 mg/dL (ref 6–20)
CO2: 26 mmol/L (ref 22–32)
Calcium: 8.4 mg/dL — ABNORMAL LOW (ref 8.9–10.3)
Chloride: 104 mmol/L (ref 98–111)
Creatinine, Ser: 0.79 mg/dL (ref 0.61–1.24)
GFR, Estimated: >60 mL/min (ref >60)
Glucose, Bld: 166 mg/dL — ABNORMAL HIGH (ref 70–99)
Potassium: 3.9 mmol/L (ref 3.5–5.1)
Sodium: 137 mmol/L (ref 135–145)

## 2021-05-14 NOTE — Progress Notes (Signed)
Subjective: 1 Day Post-Op Procedure(s) (LRB): REVERSE SHOULDER ARTHROPLASTY (Left) Patient reports pain as mild.   Patient seen in rounds this morning for Dr. Veverly Fells Patient was working with occupational therapy when I walked in, he was doing great. OT gave him the greenlight to be discharged this morning Overall doing very well, no issues overnight  Objective: Vital signs in last 24 hours: Temp:  [97.8 F (36.6 C)-98.8 F (37.1 C)] 97.8 F (36.6 C) (09/24 0547) Pulse Rate:  [50-80] 67 (09/24 0547) Resp:  [8-24] 16 (09/24 0547) BP: (110-147)/(65-89) 129/74 (09/24 0547) SpO2:  [94 %-100 %] 96 % (09/24 0547)  Intake/Output from previous day: 09/23 0701 - 09/24 0700 In: 1892.6 [P.O.:120; I.V.:1472.6; IV Piggyback:300] Out: 1875 [Urine:1775; Blood:100] Intake/Output this shift: No intake/output data recorded.  Recent Labs    05/14/21 0324  HGB 12.1*   Recent Labs    05/14/21 0324  HCT 35.8*   Recent Labs    05/14/21 0324  NA 137  K 3.9  CL 104  CO2 26  BUN 12  CREATININE 0.79  GLUCOSE 166*  CALCIUM 8.4*   No results for input(s): LABPT, INR in the last 72 hours.  On exam of his left shoulder incision sites look good, no obvious signs of infection, no cellulitis present.  No real tenderness to palpation.  There is some noticeable ecchymosis noted throughout the upper extremity.  Able to flex and extend at the left elbow with no pain, able to move all fingers.  Neurovascularly intact in his left upper extremity.   Assessment/Plan: 1 Day Post-Op Procedure(s) (LRB): REVERSE SHOULDER ARTHROPLASTY (Left) Patient was cleared by Occupational Therapy, okay to discharge home today I placed a new dressing on the patient's incision site today, Aquacel dressing applied. Okay to get wet Will follow up in the office in 2 weeks with Dr. Andris Flurry R Michole Lecuyer 05/14/2021, 8:53 AM

## 2021-05-14 NOTE — Evaluation (Signed)
Occupational Therapy Evaluation Patient Details Name: Daniel Griffin MRN: [id number] DOB: 1961-07-03 Today's Date: 05/14/2021   History of Present Illness Patient is a 60 year old man s/p Left rTSA   Clinical Impression   Daniel Griffin is a 60 year old man s/p shoulder replacement without functional use of left non-dominant upper extremity secondary to effects of surgery and interscalene block and shoulder precautions. Therapist provided education and instruction to patient in regards to exercises, precautions, positioning, donning upper extremity clothing and bathing while maintaining shoulder precautions, ice and edema management and donning/doffing sling. Patient verbalized understanding and demonstrated as needed. Patient has planned for daughter to stay with him for a couple of days and then planning to stay with his sister. He is modified independent with ADLs after instruction. No apparent balance deficits. Patient to follow up with MD for further therapy needs.       Recommendations for follow up therapy are one component of a multi-disciplinary discharge planning process, led by the attending physician.  Recommendations may be updated based on patient status, additional functional criteria and insurance authorization.   Follow Up Recommendations  Follow surgeon's recommendation for DC plan and follow-up therapies    Equipment Recommendations  None recommended by OT    Recommendations for Other Services       Precautions / Restrictions Precautions Precautions: Shoulder Type of Shoulder Precautions: No AROM/PROM of shouler, okay or elbow, wrist and hand Shoulder Interventions: Shoulder sling/immobilizer;Off for dressing/bathing/exercises Precaution Booklet Issued:  (handout) Required Braces or Orthoses: Sling Restrictions Weight Bearing Restrictions: Yes LUE Weight Bearing: Non weight bearing      Mobility Bed Mobility Overal bed mobility: Modified Independent                   Transfers Overall transfer level: Modified independent                    Balance Overall balance assessment: No apparent balance deficits (not formally assessed)                                         ADL either performed or assessed with clinical judgement   ADL Overall ADL's : Modified independent                                       General ADL Comments: after instruction     Vision Baseline Vision/History: 1 Wears glasses Patient Visual Report: No change from baseline       Perception     Praxis      Pertinent Vitals/Pain Pain Assessment: Faces Faces Pain Scale: Hurts a little bit Pain Location: R shoulder Pain Descriptors / Indicators: Shooting Pain Intervention(s): Monitored during session     Hand Dominance Right   Extremity/Trunk Assessment Upper Extremity Assessment Upper Extremity Assessment: RUE deficits/detail;LUE deficits/detail RUE Deficits / Details: WFL ROM and strength LUE Deficits / Details: Limited by shoulder precautions and effects of block LUE: Unable to fully assess due to immobilization   Lower Extremity Assessment Lower Extremity Assessment: Overall WFL for tasks assessed   Cervical / Trunk Assessment Cervical / Trunk Assessment: Normal   Communication Communication Communication: No difficulties   Cognition Arousal/Alertness: Awake/alert Behavior During Therapy: WFL for tasks assessed/performed Overall Cognitive Status: Within Functional Limits for tasks  assessed                                     General Comments       Exercises     Shoulder Instructions Shoulder Instructions Donning/doffing shirt without moving shoulder: Independent Method for sponge bathing under operated UE: Independent Donning/doffing sling/immobilizer: Independent Correct positioning of sling/immobilizer: Independent ROM for elbow, wrist and digits of operated UE:  Independent Sling wearing schedule (on at all times/off for ADL's): Independent Proper positioning of operated UE when showering: Independent Dressing change: Independent Positioning of UE while sleeping: [place] expects to be discharged to:: Private residence Living Arrangements: Alone Available Help at Discharge: Family;Available PRN/intermittently                                    Prior Functioning/Environment Level of Independence: Independent        Comments: Daughter will be staying with him and then he is staying with his sister.        OT Problem List: Decreased strength;Decreased range of motion;Impaired UE functional use;Pain      OT Treatment/Interventions:      OT Goals(Current goals can be found in the care plan section) Acute Rehab OT Goals OT Goal Formulation: All assessment and education complete, DC therapy  OT Frequency:     Barriers to D/C:            Co-evaluation              AM-PAC OT "6 Clicks" Daily Activity     Outcome Measure Help from another person eating meals?: None Help from another person taking care of personal grooming?: None Help from another person toileting, which includes using toliet, bedpan, or urinal?: None Help from another person bathing (including washing, rinsing, drying)?: None Help from another person to put on and taking off regular upper body clothing?: None Help from another person to put on and taking off regular lower body clothing?: None 6 Click Score: 24   End of Session Nurse Communication:  (OT education complete)  Activity Tolerance: Patient tolerated treatment well Patient left: in chair;with call bell/phone within reach  OT Visit Diagnosis: Pain                Time: 5859-2924 OT Time Calculation (min): 21 min Charges:  OT General Charges $OT Visit: 1 Visit OT Evaluation $OT Eval Low Complexity: 1 Low  Lexington Devine, OTR/L [place]   Office [phone] Pager: [phone]   Lenward Chancellor 05/14/2021, 8:58 AM

## 2021-05-14 NOTE — Plan of Care (Signed)
Patient discharged.

## 2021-05-15 ENCOUNTER — Encounter (HOSPITAL_COMMUNITY): Payer: Self-pay | Admitting: Orthopedic Surgery

## 2021-05-18 ENCOUNTER — Telehealth: Payer: Self-pay | Admitting: *Deleted

## 2021-05-18 NOTE — Telephone Encounter (Signed)
Transition Care Management Unsuccessful Follow-up Telephone Call  Date of discharge and from where:  05/14/21  [facility]  Attempts:  1st Attempt  Reason for unsuccessful TCM follow-up call:  No answer/busy- voicemail box not set up per message.  Jacqlyn Larsen [facility], BSN RN Case Manager [phone]

## 2021-05-19 ENCOUNTER — Telehealth: Payer: Self-pay

## 2021-05-19 NOTE — Telephone Encounter (Signed)
Transition Care Management Unsuccessful Follow-up Telephone Call  Date of discharge and from where:  05/14/21 from [facility]  Attempts:  2nd Attempt  Reason for unsuccessful TCM follow-up call:  Left voice message on home line. Unable to leave message on mobile number.   Thea Silversmith, RN, MSN, BSN, CCM Care Management Coordinator [facility] [phone]

## 2021-05-24 NOTE — Discharge Summary (Signed)
In most cases prophylactic antibiotics for Dental procdeures after total joint surgery are not necessary.  Exceptions are as follows:  1. History of prior total joint infection  2. Severely immunocompromised (Organ Transplant, cancer chemotherapy, Rheumatoid biologic meds such as [place])  3. Poorly controlled diabetes (A1C &gt; 8.0, blood glucose over 200)  If you have one of these conditions, contact your surgeon for an antibiotic prescription, prior to your dental procedure. Orthopedic Discharge Summary        Physician Discharge Summary  Patient ID: Daniel Griffin MRN: [id number] DOB/AGE: 1961/01/17 60 y.o.  Admit date: 05/13/2021 Discharge date: 05/14/21  Procedures:  Procedure(s) (LRB): REVERSE SHOULDER ARTHROPLASTY (Left)  Attending Physician:  Dr. Esmond Plants  Admission Diagnoses:   left shoulder cuff arthropathy  Discharge Diagnoses:  left shoulder cuff arthropathy   Past Medical History:  Diagnosis Date   Arthritis    Dementia ([place])    Seizures ([place]) 06/21/2020   ETOH w/d related    PCP: Chevis Pretty, FNP   Discharged Condition: good  Hospital Course:  Patient underwent the above stated procedure on 05/13/2021. Patient tolerated the procedure well and brought to the recovery room in good condition and subsequently to the floor. Patient had an uncomplicated hospital course and was stable for discharge.   Disposition: Discharge disposition: 01-Home or Self Care      with follow up in 2 weeks    Follow-up Information     Netta Cedars, MD. Call in 2 week(s).   Specialty: Orthopedic Surgery Why: call [phone] for appt Contact information: [address] [place] [address] [phone]                 Dental Antibiotics:  In most cases prophylactic antibiotics for Dental procdeures after total joint surgery are not necessary.  Exceptions are as follows:  1. History of prior total joint  infection  2. Severely immunocompromised (Organ Transplant, cancer chemotherapy, Rheumatoid biologic meds such as [place])  3. Poorly controlled diabetes (A1C &gt; 8.0, blood glucose over 200)  If you have one of these conditions, contact your surgeon for an antibiotic prescription, prior to your dental procedure.  Discharge Instructions     Call MD / Call 911   Complete by: As directed    If you experience chest pain or shortness of breath, CALL 911 and be transported to the hospital emergency room.  If you develope a fever above 101 F, pus (white drainage) or increased drainage or redness at the wound, or calf pain, call your surgeon's office.   Constipation Prevention   Complete by: As directed    Drink plenty of fluids.  Prune juice may be helpful.  You may use a stool softener, such as Colace (over the counter) 100 mg twice a day.  Use MiraLax (over the counter) for constipation as needed.   Diet - low sodium heart healthy   Complete by: As directed    Discharge instructions   Complete by: As directed    Follow up with Dr. Veverly Fells in 2 weeks Call the office with any issues   Increase activity slowly as tolerated   Complete by: As directed    Post-operative opioid taper instructions:   Complete by: As directed    POST-OPERATIVE OPIOID TAPER INSTRUCTIONS: It is important to wean off of your opioid medication as soon as possible. If you do not need pain medication after your surgery it is ok to stop day one. Opioids include: Codeine,  Hydrocodone(Norco, Vicodin), Oxycodone(Percocet, oxycontin) and hydromorphone amongst others.  Long term and even short term use of opiods can cause: Increased pain response Dependence Constipation Depression Respiratory depression And more.  Withdrawal symptoms can include Flu like symptoms Nausea, vomiting And more Techniques to manage these symptoms Hydrate well Eat regular healthy meals Stay active Use relaxation techniques(deep  breathing, meditating, yoga) Do Not substitute Alcohol to help with tapering If you have been on opioids for less than two weeks and do not have pain than it is ok to stop all together.  Plan to wean off of opioids This plan should start within one week post op of your joint replacement. Maintain the same interval or time between taking each dose and first decrease the dose.  Cut the total daily intake of opioids by one tablet each day Next start to increase the time between doses. The last dose that should be eliminated is the evening dose.          Allergies as of 05/14/2021   No Known Allergies      Medication List     TAKE these medications    buPROPion 150 MG 24 hr tablet Commonly known as: Wellbutrin XL Take 1 tablet (150 mg total) by mouth daily.   escitalopram 10 MG tablet Commonly known as: Lexapro Take 1 tablet (10 mg total) by mouth daily.   methocarbamol 500 MG tablet Commonly known as: Robaxin Take 1 tablet (500 mg total) by mouth every 6 (six) hours as needed for muscle spasms.   ondansetron 4 MG tablet Commonly known as: Zofran Take 1 tablet (4 mg total) by mouth daily as needed for nausea or vomiting.   Orencia ClickJect 428 MG/ML Soaj Generic drug: Abatacept INJECT 125 MG INTO THE SKIN ONCE A WEEK. What changed: additional instructions   oxyCODONE-acetaminophen 5-325 MG tablet Commonly known as: Percocet Take 1 tablet by mouth every 4 (four) hours as needed for severe pain.          Signed: Ventura Bruns 05/24/2021, 7:52 AM  [facility] is now [facility] [address], [place], [place], [place] [address] Phone: [place]

## 2021-05-26 DIAGNOSIS — Z4789 Encounter for other orthopedic aftercare: Secondary | ICD-10-CM | POA: Diagnosis not present

## 2021-05-26 DIAGNOSIS — M19112 Post-traumatic osteoarthritis, left shoulder: Secondary | ICD-10-CM | POA: Diagnosis not present

## 2021-05-26 DIAGNOSIS — Z0271 Encounter for disability determination: Secondary | ICD-10-CM

## 2021-06-09 ENCOUNTER — Other Ambulatory Visit (HOSPITAL_COMMUNITY): Payer: Self-pay

## 2021-06-23 DIAGNOSIS — M25562 Pain in left knee: Secondary | ICD-10-CM | POA: Diagnosis not present

## 2021-06-23 DIAGNOSIS — M25561 Pain in right knee: Secondary | ICD-10-CM | POA: Diagnosis not present

## 2021-06-23 DIAGNOSIS — Z4789 Encounter for other orthopedic aftercare: Secondary | ICD-10-CM | POA: Diagnosis not present

## 2021-06-27 ENCOUNTER — Other Ambulatory Visit (HOSPITAL_COMMUNITY): Payer: Self-pay

## 2021-07-11 ENCOUNTER — Other Ambulatory Visit (HOSPITAL_COMMUNITY): Payer: Self-pay

## 2021-07-18 ENCOUNTER — Other Ambulatory Visit: Payer: Self-pay | Admitting: *Deleted

## 2021-07-18 DIAGNOSIS — Z79899 Other long term (current) drug therapy: Secondary | ICD-10-CM | POA: Diagnosis not present

## 2021-07-18 LAB — COMPLETE METABOLIC PANEL WITH GFR
AG Ratio: 1.8 (calc) (ref 1.0–2.5)
ALT: 16 U/L (ref 9–46)
AST: 15 U/L (ref 10–35)
Albumin: 4.2 g/dL (ref 3.6–5.1)
Alkaline phosphatase (APISO): 84 U/L (ref 35–144)
BUN: 15 mg/dL (ref 7–25)
CO2: 30 mmol/L (ref 20–32)
Calcium: 9.4 mg/dL (ref 8.6–10.3)
Chloride: 105 mmol/L (ref 98–110)
Creat: 0.89 mg/dL (ref 0.70–1.35)
Globulin: 2.4 g/dL (calc) (ref 1.9–3.7)
Glucose, Bld: 97 mg/dL (ref 65–99)
Potassium: 4.3 mmol/L (ref 3.5–5.3)
Sodium: 142 mmol/L (ref 135–146)
Total Bilirubin: 0.7 mg/dL (ref 0.2–1.2)
Total Protein: 6.6 g/dL (ref 6.1–8.1)
eGFR: 97 mL/min/{1.73_m2} (ref >=60)

## 2021-07-18 LAB — CBC WITH DIFFERENTIAL/PLATELET
Absolute Monocytes: 752 cells/uL (ref 200–950)
Basophils Absolute: 44 cells/uL (ref 0–200)
Basophils Relative: 0.6 %
Eosinophils Absolute: 73 cells/uL (ref 15–500)
Eosinophils Relative: 1 %
HCT: 41.3 % (ref 38.5–50.0)
Hemoglobin: 13.7 g/dL (ref 13.2–17.1)
Lymphs Abs: 1467 cells/uL (ref 850–3900)
MCH: 31.1 pg (ref 27.0–33.0)
MCHC: 33.2 g/dL (ref 32.0–36.0)
MCV: 93.7 fL (ref 80.0–100.0)
MPV: 10.2 fL (ref 7.5–12.5)
Monocytes Relative: 10.3 %
Neutro Abs: 4964 cells/uL (ref 1500–7800)
Neutrophils Relative %: 68 %
Platelets: 231 10*3/uL (ref 140–400)
RBC: 4.41 10*6/uL (ref 4.20–5.80)
RDW: 12.4 % (ref 11.0–15.0)
Total Lymphocyte: 20.1 %
WBC: 7.3 10*3/uL (ref 3.8–10.8)

## 2021-07-19 NOTE — Progress Notes (Signed)
CBC and CMP normal

## 2021-07-21 DIAGNOSIS — M25561 Pain in right knee: Secondary | ICD-10-CM | POA: Diagnosis not present

## 2021-07-21 DIAGNOSIS — M25511 Pain in right shoulder: Secondary | ICD-10-CM | POA: Diagnosis not present

## 2021-07-21 DIAGNOSIS — M25562 Pain in left knee: Secondary | ICD-10-CM | POA: Diagnosis not present

## 2021-07-21 DIAGNOSIS — Z4789 Encounter for other orthopedic aftercare: Secondary | ICD-10-CM | POA: Diagnosis not present

## 2021-07-27 ENCOUNTER — Other Ambulatory Visit: Payer: BC Managed Care – PPO

## 2021-07-27 ENCOUNTER — Ambulatory Visit
Admission: RE | Admit: 2021-07-27 | Discharge: 2021-07-27 | Disposition: A | Discharge status: Home / Self Care | Payer: BC Managed Care – PPO | Source: Ambulatory Visit | Attending: Adult Health | Admitting: Adult Health

## 2021-07-27 ENCOUNTER — Other Ambulatory Visit: Payer: Self-pay

## 2021-07-27 DIAGNOSIS — I671 Cerebral aneurysm, nonruptured: Secondary | ICD-10-CM

## 2021-07-27 DIAGNOSIS — I720 Aneurysm of carotid artery: Secondary | ICD-10-CM | POA: Diagnosis not present

## 2021-07-27 DIAGNOSIS — R42 Dizziness and giddiness: Secondary | ICD-10-CM | POA: Diagnosis not present

## 2021-07-27 MED ORDER — IOPAMIDOL (ISOVUE-370) INJECTION 76%
75.0000 mL | Freq: Once | INTRAVENOUS | Status: AC | PRN
  Administered 2021-07-27: 75 mL via INTRAVENOUS

## 2021-07-28 ENCOUNTER — Other Ambulatory Visit: Payer: Self-pay | Admitting: Adult Health

## 2021-07-28 ENCOUNTER — Telehealth: Payer: Self-pay

## 2021-07-28 DIAGNOSIS — I671 Cerebral aneurysm, nonruptured: Secondary | ICD-10-CM

## 2021-07-28 NOTE — Telephone Encounter (Signed)
Contacted pt, spoke to sister [name] per DPR,  informed her of results and also gave her reminder of next appt. She was appreciative.

## 2021-07-28 NOTE — Telephone Encounter (Signed)
Contacted pt home and cell, no answer/VM not available. Will contact later.

## 2021-07-28 NOTE — Telephone Encounter (Signed)
-----   Message from Frann Rider, NP sent at 07/28/2021  9:01 AM EST ----- Please advise patient/sister that recent imaging showed stable appearance of aneurysm but will refer to interventional radiology to continue to monitor/manage ongoing. Thank you

## 2021-08-08 ENCOUNTER — Other Ambulatory Visit (HOSPITAL_COMMUNITY): Payer: Self-pay

## 2021-08-08 ENCOUNTER — Other Ambulatory Visit: Payer: Self-pay | Admitting: Physician Assistant

## 2021-08-08 MED ORDER — ORENCIA CLICKJECT 125 MG/ML ~~LOC~~ SOAJ
125.0000 mg | SUBCUTANEOUS | 0 refills | Status: DC
  Filled 2021-08-08: qty 4, 28d supply, fill #0

## 2021-08-08 NOTE — Telephone Encounter (Signed)
Next Visit: Due August 2022. Message sent to the front to schedule.   Last Visit: 11/16/2020  Last Fill: 04/28/2021  [id number] arthritis involving multiple sites with positive rheumatoid factor   Current Dose per office note 11/16/2020: Orencia sq 1q week   Labs: 07/18/2021 CBC and CMP normal.  TB Gold: 08/27/2020, negative   Okay to refill Orencia?

## 2021-08-08 NOTE — Telephone Encounter (Signed)
Please schedule patient for a follow up visit. Patient due was August 2022. Thanks!

## 2021-08-09 ENCOUNTER — Other Ambulatory Visit (HOSPITAL_COMMUNITY): Payer: Self-pay

## 2021-08-17 ENCOUNTER — Other Ambulatory Visit (HOSPITAL_COMMUNITY): Payer: Self-pay | Admitting: Neuroradiology

## 2021-08-17 ENCOUNTER — Other Ambulatory Visit: Payer: Self-pay | Admitting: Nurse Practitioner

## 2021-08-17 DIAGNOSIS — F322 Major depressive disorder, single episode, severe without psychotic features: Secondary | ICD-10-CM

## 2021-08-17 DIAGNOSIS — I671 Cerebral aneurysm, nonruptured: Secondary | ICD-10-CM

## 2021-08-17 DIAGNOSIS — M25811 Other specified joint disorders, right shoulder: Secondary | ICD-10-CM | POA: Diagnosis not present

## 2021-08-17 DIAGNOSIS — M170 Bilateral primary osteoarthritis of knee: Secondary | ICD-10-CM | POA: Diagnosis not present

## 2021-08-17 NOTE — Telephone Encounter (Signed)
Reached out to [place] w/ [facility] to see if they can help with referral.

## 2021-08-24 ENCOUNTER — Ambulatory Visit (HOSPITAL_COMMUNITY)
Admission: RE | Admit: 2021-08-24 | Discharge: 2021-08-24 | Disposition: A | Discharge status: Home / Self Care | Payer: BC Managed Care – PPO | Source: Ambulatory Visit | Attending: Neuroradiology | Admitting: Neuroradiology

## 2021-08-24 ENCOUNTER — Other Ambulatory Visit: Payer: Self-pay

## 2021-08-24 ENCOUNTER — Other Ambulatory Visit (HOSPITAL_COMMUNITY): Payer: Self-pay | Admitting: Neuroradiology

## 2021-08-24 DIAGNOSIS — I671 Cerebral aneurysm, nonruptured: Secondary | ICD-10-CM

## 2021-08-24 NOTE — Consult Note (Signed)
Chief Complaint: Patient was seen in consultation today for brain aneurysm.  Referring Physician(s): Frann Rider, NP   Supervising Physician: Pedro Earls  Patient Status: [facility] - Out-pt  History of Present Illness: Daniel Griffin is a 61 y.o. male with a past medical history of rheumatoid arthritis, prior alcohol abuse and recent left shoulder arthroplasty (05/13/2021) presenting here today for evaluation of incidental right MCA bifurcation aneurysm.  Daniel Griffin head and episode of altered mental status November 2021 with no acute pathology identified on brain MRI.  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  He denies family history of subarachnoid hemorrhage or brain aneurysm.  He does not smoke and has never smoked. He is accompanied by his sister who helps him with medical decision making. He lives alone, but needs help with finances. He has just been approved for social security disability.  Past Medical History:  Diagnosis Date   Arthritis    Dementia ([place])    Seizures ([place]) 06/21/2020   ETOH w/d related    Past Surgical History:  Procedure Laterality Date   ANKLE FRACTURE SURGERY     Left   APPENDECTOMY     REVERSE SHOULDER ARTHROPLASTY Left 05/13/2021   Procedure: REVERSE SHOULDER ARTHROPLASTY;  Surgeon: Netta Cedars, MD;  Location: [facility] OR[facility];  Service: Orthopedics;  Laterality: Left;  with ISB    Allergies: Patient has no known allergies.  Medications: Prior to Admission medications   Medication Sig Start Date End Date Taking? Authorizing Provider  Abatacept (ORENCIA CLICKJECT) 284 MG/ML SOAJ INJECT 125 MG INTO THE SKIN ONCE A WEEK. 08/08/21 08/08/22  Ofilia Neas, PA-C  buPROPion (WELLBUTRIN XL) 150 MG 24 hr tablet Take 1 tablet (150 mg total) by mouth daily. 04/21/21    Frann Rider, NP  escitalopram (LEXAPRO) 10 MG tablet Take 1 tablet (10 mg total) by mouth daily. (NEEDS TO BE SEEN BEFORE NEXT REFILL) 08/17/21   Chevis Pretty, FNP  methocarbamol (ROBAXIN) 500 MG tablet Take 1 tablet (500 mg total) by mouth every 6 (six) hours as needed for muscle spasms. 05/13/21   Netta Cedars, MD  ondansetron [facility]) 4 MG tablet Take 1 tablet (4 mg total) by mouth daily as needed for nausea or vomiting. 05/13/21 05/13/22  Netta Cedars, MD  oxyCODONE-acetaminophen (PERCOCET) 5-325 MG tablet Take 1 tablet by mouth every 4 (four) hours as needed for severe pain. 05/13/21 05/13/22  Netta Cedars, MD     Family History  Problem Relation Age of Onset   Alzheimer's disease Father    Healthy Son    Healthy Daughter    Rheum arthritis Daughter    Healthy Daughter     Social History   Socioeconomic History   Marital status: Single    Spouse name: Not on file   Number of children: Not on file   Years of education: Not on file   Highest education level: Not on file  Occupational History   Occupation: not worked since seizure 06/21/20  Tobacco Use   Smoking status: Never   Smokeless tobacco: Current    Types: Snuff, Chew  Vaping Use   Vaping Use: Never used  Substance and Sexual Activity   Alcohol use: Not Currently    Comment: Quit in 06/2020   Drug use: Yes    Frequency:  2.0 times per week    Types: Marijuana   Sexual activity: Not on file  Other Topics Concern   Not on file  Social History Narrative   Lives alone   Right Handed   Rarely drinks caffeine       Social Determinants of Health   Financial Resource Strain: Not on file  Food Insecurity: Not on file  Transportation Needs: Not on file  Physical Activity: Not on file  Stress: Not on file  Social Connections: Not on file     Review of Systems: A 12 point ROS discussed and pertinent positives are indicated in the HPI above.  All other systems are negative.  Review of Systems  Vital  Signs: There were no vitals taken for this visit.  Physical Exam Constitutional:      Appearance: Normal appearance.  HENT:     Head: Normocephalic and atraumatic.  Eyes:     Extraocular Movements: Extraocular movements intact.     Pupils: Pupils are equal, round, and reactive to light.  Neurological:     Mental Status: He is alert and oriented to person, place, and time.     Cranial Nerves: Cranial nerves 2-12 are intact.     Sensory: Sensation is intact.     Motor: Motor function is intact.     Gait: Gait is intact.         Imaging: CT ANGIO HEAD NECK W WO CM  Result Date: 07/27/2021 CLINICAL DATA:  Carotid artery aneurysm.  Dizziness. EXAM: CT ANGIOGRAPHY HEAD AND NECK TECHNIQUE: Multidetector CT imaging of the head and neck was performed using the standard protocol during bolus administration of intravenous contrast. Multiplanar CT image reconstructions and MIPs were obtained to evaluate the vascular anatomy. Carotid stenosis measurements (when applicable) are obtained utilizing NASCET criteria, using the distal internal carotid diameter as the denominator. CONTRAST:  42mL ISOVUE-370 IOPAMIDOL (ISOVUE-370) INJECTION 76% COMPARISON:  10/12/2020 FINDINGS: CT HEAD FINDINGS Brain: No sign of acute infarction, mass lesion, hemorrhage, hydrocephalus or extra-axial collection. Minimal chronic small-vessel ischemic changes of the white matter. Vascular: There is atherosclerotic calcification of the major vessels at the base of the brain. Skull: Negative Sinuses: Clear/normal Orbits: Normal Review of the MIP images confirms the above findings CTA NECK FINDINGS Aortic arch: Aortic arch is normal. Branching pattern is normal without origin stenosis. Right carotid system: Common carotid artery widely patent to the bifurcation. Mild soft and calcified plaque at the carotid bifurcation and ICA bulb but no stenosis. Cervical ICA is patent to the skull base. As seen previously, there is what appears to  be an old localized dissection in the upper cervical ICA with a 5 mm aneurysm or pseudoaneurysm projecting medial and posterior. This does not appear to have enlarged or changed over the last 6-7 months. Left carotid system: Common carotid artery widely patent to the bifurcation. Mild calcified plaque at the carotid bifurcation but no stenosis. Cervical ICA is normal. Vertebral arteries: Both vertebral artery origins shows some atherosclerotic plaque but there is no stenosis greater than 30%. Both vertebral arteries are patent through the cervical region to the foramen magnum. Some calcified plaque of the left vertebral artery at the C1 level but without measurable stenosis. Skeleton: Ordinary mid cervical spondylosis without apparent change. Other neck: No mass or lymphadenopathy. Upper chest: Normal Review of the MIP images confirms the above findings CTA HEAD FINDINGS Anterior circulation: Both internal carotid arteries are widely patent through the skull base and siphon regions. There is siphon  atherosclerotic calcification but no stenosis. The anterior and middle cerebral vessels are patent. No large vessel occlusion or correctable proximal stenosis. There is a 3 mm aneurysm at the middle cerebral artery bifurcation on the right this is unchanged from the prior examination. Posterior circulation: Both vertebral arteries are widely patent to the basilar. No basilar stenosis. Posterior circulation branch vessels appear normal. Venous sinuses: Patent and normal. Anatomic variants: None significant. 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 No change since the study of 6-7 months ago. 3 mm aneurysm at the right middle cerebral artery bifurcation. This was present on the previous study and has not changed. Neurovascular consultation suggested for evaluation of  this aneurysm. Atherosclerotic change at both carotid bifurcations but no stenosis. Atherosclerotic change at both vertebral artery origins but no stenosis greater than 30%. Electronically Signed   By: Nelson Chimes M.D.   On: 07/27/2021 15:46    Labs:  CBC: Recent Labs    02/15/21 1528 02/25/21 0833 05/06/21 1033 05/14/21 0324 07/18/21 0900  WBC 9.6 11.2* 9.3  --  7.3  HGB 14.4 13.6 13.5 12.1* 13.7  HCT 43.6 41.0 41.3 35.8* 41.3  PLT 253 222 213  --  231    COAGS: No results for input(s): INR, APTT in the last 8760 hours.  BMP: Recent Labs    02/25/21 0833 05/06/21 1033 05/14/21 0324 07/18/21 0900  NA 140 141 137 142  K 4.7 4.4 3.9 4.3  CL 106 106 104 105  CO2 29 30 26 30   GLUCOSE 92 94 166* 97  BUN 18 13 12 15   CALCIUM 8.9 9.4 8.4* 9.4  CREATININE 0.81 0.70 0.79 0.89  GFRNONAA 97 >60 >60  --   GFRAA 112  --   --   --     LIVER FUNCTION TESTS: Recent Labs    02/15/21 1528 02/25/21 0833 05/06/21 1033 07/18/21 0900  BILITOT 0.6 0.8 0.9 0.7  AST 17 12 16 15   ALT 19 17 18 16   ALKPHOS 78  --  62  --   PROT 7.4 6.0* 7.0 6.6  ALBUMIN 4.5  --  4.2  --     TUMOR MARKERS: No results for input(s): AFPTM, CEA, CA199, CHROMGRNA in the last 8760 hours.  Assessment and Plan:  I reviewed CTA findings with Daniel Griffin and his sister and explained the natural history of brain aneurysms.  I explained that in case of suspected aneurysm rupture with thunderclap headache, emergency services should be contacted via 911 call.  I offered to proceed with a diagnostic cerebral angiogram to better evaluate the right MCA bifurcation aneurysm and its relationship with the parent vessel as well as evaluate the cervical right ICA dissection with pseudoaneurysm.  They would like to pursue the diagnostic cerebral angiogram.  Further management will be decided based upon angiography findings.   Thank you for this interesting consult.  I greatly enjoyed meeting Daniel Griffin and look  forward to participating in their care.  A copy of this report was sent to the requesting provider on this date.  Electronically Signed: Pedro Earls, MD 08/24/2021, 1:01 PM   I spent a total of  40 Minutes   in face to face in clinical consultation, greater than 50% of which was counseling/coordinating care for brain aneurysm.

## 2021-08-31 ENCOUNTER — Other Ambulatory Visit: Payer: Self-pay | Admitting: Radiology

## 2021-08-31 ENCOUNTER — Other Ambulatory Visit (HOSPITAL_COMMUNITY): Payer: Self-pay

## 2021-08-31 NOTE — H&P (Signed)
Chief Complaint: Patient was seen in consultation today for diagnostic cerebral angiogram at the request of Frann Rider, NP   Referring Physician(s): Frann Rider, NP   Supervising Physician: Pedro Earls  Patient Status: [facility] - Out-pt  History of Present Illness: Daniel Griffin is a 61 y.o. male with PMHs of dementia, rheumatoid arthritis, remote history of alcohol abuse, and right MCA bifurcation aneurysm.  Patient developed AMS in November 2021 and was referred to neurology. 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  Patient was referred to Dr. Karenann Cai for endovascular treatment for the aneurysms and has a consultation visit on 08/24/21. A diagnostic cerebral angiogram was recommended to the patient, after thorough discussion and shared decision making, patient proceed with the angiogram.   Patient presents to [place] for the procedure.  Patient laying in bed, not in acute distress.  Denise double vision, headache, focal weakness in extremities, fever, chills, shortness of breath, cough, chest pain, abdominal pain, nausea ,vomiting, and bleeding.   Past Medical History:  Diagnosis Date   Arthritis    Dementia ([place])    Seizures ([place]) 06/21/2020   ETOH w/d related    Past Surgical History:  Procedure Laterality Date   ANKLE FRACTURE SURGERY     Left   APPENDECTOMY     REVERSE SHOULDER ARTHROPLASTY Left 05/13/2021   Procedure: REVERSE SHOULDER ARTHROPLASTY;  Surgeon: Netta Cedars, MD;  Location: [facility];  Service: Orthopedics;  Laterality: Left;  with ISB    Allergies: Patient has no known allergies.  Medications: Prior to Admission medications   Medication Sig Start Date End Date Taking? Authorizing Provider  Abatacept (ORENCIA CLICKJECT) 163 MG/ML SOAJ INJECT 125 MG INTO THE SKIN ONCE A WEEK.  08/08/21 08/08/22 Yes Ofilia Neas, PA-C  buPROPion (WELLBUTRIN XL) 150 MG 24 hr tablet Take 1 tablet (150 mg total) by mouth daily. 04/21/21  Yes McCue, Janett Billow, NP  escitalopram (LEXAPRO) 10 MG tablet Take 1 tablet (10 mg total) by mouth daily. (NEEDS TO BE SEEN BEFORE NEXT REFILL) 08/17/21  Yes Hassell Done, Mary-Margaret, FNP  methocarbamol (ROBAXIN) 500 MG tablet Take 1 tablet (500 mg total) by mouth every 6 (six) hours as needed for muscle spasms. Patient not taking: Reported on 08/29/2021 05/13/21   Netta Cedars, MD  ondansetron (ZOFRAN) 4 MG tablet Take 1 tablet (4 mg total) by mouth daily as needed for nausea or vomiting. Patient not taking: Reported on 08/29/2021 05/13/21 05/13/22  Netta Cedars, MD  oxyCODONE-acetaminophen (PERCOCET) 5-325 MG tablet Take 1 tablet by mouth every 4 (four) hours as needed for severe pain. Patient not taking: Reported on 08/29/2021 05/13/21 05/13/22  Netta Cedars, MD     Family History  Problem Relation Age of Onset   Alzheimer's disease Father    Healthy Son    Healthy Daughter    Rheum arthritis Daughter    Healthy Daughter     Social History   Socioeconomic History   Marital status: Single    Spouse name: Not on file   Number of children: Not on file   Years of education: Not on file   Highest education level: Not on file  Occupational History   Occupation: not worked since seizure 06/21/20  Tobacco Use   Smoking status: Never   Smokeless tobacco: Current    Types: Snuff, Chew  Vaping  Use   Vaping Use: Never used  Substance and Sexual Activity   Alcohol use: Not Currently    Comment: Quit in 06/2020   Drug use: Yes    Frequency: 2.0 times per week    Types: Marijuana   Sexual activity: Not on file  Other Topics Concern   Not on file  Social History Narrative   Lives alone   Right Handed   Rarely drinks caffeine       Social Determinants of Health   Financial Resource Strain: Not on file  Food Insecurity: Not on file  Transportation  Needs: Not on file  Physical Activity: Not on file  Stress: Not on file  Social Connections: Not on file     Review of Systems: A 12 point ROS discussed and pertinent positives are indicated in the HPI above.  All other systems are negative.  Vital Signs: BP 124/68 (BP Location: Right Arm)    Pulse 60    Temp 98 F (36.7 C) (Oral)    Ht 5\' 7"  (1.702 m)    Wt 200 lb (90.7 kg)    SpO2 99%    BMI 31.32 kg/m    Physical Exam Vitals and nursing note reviewed.  Constitutional:      General: Patient is not in acute distress.    Appearance: Normal appearance. Patient is not ill-appearing.  HENT:     Head: Normocephalic and atraumatic.     Mouth/Throat:     Mouth: Mucous membranes are moist.     Pharynx: Oropharynx is clear.  Cardiovascular:     Rate and Rhythm: Normal rate and regular rhythm.     Pulses: Normal pulses.     Heart sounds: Normal heart sounds.  Pulmonary:     Effort: Pulmonary effort is normal.     Breath sounds: Normal breath sounds.  Abdominal:     General: Abdomen is flat. Bowel sounds are normal.     Palpations: Abdomen is soft.  Musculoskeletal:     Cervical back: Neck supple.  Skin:    General: Skin is warm and dry.     Coloration: Skin is not jaundiced or pale.  Neurological:     Mental Status: Patient is alert and oriented to person, place, and time.  Psychiatric:        Mood and Affect: Mood normal.        Behavior: Behavior normal.        Judgment: Judgment normal.    MD Evaluation Airway: WNL Heart: WNL Abdomen: WNL Chest/ Lungs: WNL ASA  Classification: 3 Mallampati/Airway Score: One  Imaging: No results found.  Labs:  CBC: Recent Labs    02/25/21 0833 05/06/21 1033 05/14/21 0324 07/18/21 0900 09/01/21 0639  WBC 11.2* 9.3  --  7.3 9.2  HGB 13.6 13.5 12.1* 13.7 13.0  HCT 41.0 41.3 35.8* 41.3 39.4  PLT 222 213  --  231 196    COAGS: Recent Labs    09/01/21 0639  INR 0.8    BMP: Recent Labs    02/25/21 0833  05/06/21 1033 05/14/21 0324 07/18/21 0900 09/01/21 0639  NA 140 141 137 142 140  K 4.7 4.4 3.9 4.3 3.9  CL 106 106 104 105 106  CO2 29 30 26 30 25   GLUCOSE 92 94 166* 97 107*  BUN 18 13 12 15 17   CALCIUM 8.9 9.4 8.4* 9.4 8.7*  CREATININE 0.81 0.70 0.79 0.89 0.65  GFRNONAA 97 >60 >60  --  >60  GFRAA 112  --   --   --   --     LIVER FUNCTION TESTS: Recent Labs    02/15/21 1528 02/25/21 0833 05/06/21 1033 07/18/21 0900  BILITOT 0.6 0.8 0.9 0.7  AST 17 12 16 15   ALT 19 17 18 16   ALKPHOS 78  --  62  --   PROT 7.4 6.0* 7.0 6.6  ALBUMIN 4.5  --  4.2  --     TUMOR MARKERS: No results for input(s): AFPTM, CEA, CA199, CHROMGRNA in the last 8760 hours.  Assessment and Plan: 61 y.o. 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 Patient was referred to Dr. Karenann Cai for endovascular treatment for the aneurysms and has a consultation visit on 08/24/21. A diagnostic cerebral angiogram was recommended to the patient, after thorough discussion and shared decision making, patient proceed with the angiogram.   Patient presents to [place] today for the procedure.  NPO since MN VSS CBC stable BMP RF normal  INR 0.8  Not on AC/AP   Risks and benefits of cerebral angiogram with intervention were discussed with the patient including, but not limited to bleeding, infection, vascular injury, contrast induced renal failure, stroke or even death.  This interventional procedure involves the use of X-rays and because of the nature of the planned procedure, it is possible that we will have prolonged use of X-ray fluoroscopy.  Potential radiation risks to you include (but are not limited to) the following: - A slightly elevated risk for cancer  several years later in life. This risk is typically less than 0.5% percent. This risk is low in comparison to the normal incidence of  human cancer, which is 33% for women and 50% for men according to the [place]. - Radiation induced injury can include skin redness, resembling a rash, tissue breakdown / ulcers and hair loss (which can be temporary or permanent).   The likelihood of either of these occurring depends on the difficulty of the procedure and whether you are sensitive to radiation due to previous procedures, disease, or genetic conditions.   IF your procedure requires a prolonged use of radiation, you will be notified and given written instructions for further action.  It is your responsibility to monitor the irradiated area for the 2 weeks following the procedure and to notify your physician if you are concerned that you have suffered a radiation induced injury.    All of the patient's questions were answered, patient is agreeable to proceed.  Consent signed and in chart.  Thank you for this interesting consult.  I greatly enjoyed meeting Daniel Griffin and look forward to participating in their care.  A copy of this report was sent to the requesting provider on this date.  Electronically Signed: Tera Mater, PA-C 09/01/2021, 8:32 AM   I spent a total of    25 Minutes in face to face in clinical consultation, greater than 50% of which was counseling/coordinating care for diagnostic cerebral angiogram.   This chart was dictated using voice recognition software.  Despite best efforts to proofread,  errors can occur which can change the documentation meaning.

## 2021-09-01 ENCOUNTER — Other Ambulatory Visit (HOSPITAL_COMMUNITY): Payer: Self-pay | Admitting: Neuroradiology

## 2021-09-01 ENCOUNTER — Encounter (HOSPITAL_COMMUNITY): Payer: Self-pay

## 2021-09-01 ENCOUNTER — Other Ambulatory Visit: Payer: Self-pay

## 2021-09-01 ENCOUNTER — Ambulatory Visit (HOSPITAL_COMMUNITY)
Admission: RE | Admit: 2021-09-01 | Discharge: 2021-09-01 | Disposition: A | Discharge status: Home / Self Care | Payer: BC Managed Care – PPO | Source: Ambulatory Visit | Attending: Neuroradiology | Admitting: Neuroradiology

## 2021-09-01 DIAGNOSIS — F0390 Unspecified dementia without behavioral disturbance: Secondary | ICD-10-CM | POA: Diagnosis not present

## 2021-09-01 DIAGNOSIS — I7771 Dissection of carotid artery: Secondary | ICD-10-CM | POA: Diagnosis not present

## 2021-09-01 DIAGNOSIS — M069 Rheumatoid arthritis, unspecified: Secondary | ICD-10-CM | POA: Diagnosis not present

## 2021-09-01 DIAGNOSIS — I671 Cerebral aneurysm, nonruptured: Secondary | ICD-10-CM | POA: Diagnosis not present

## 2021-09-01 HISTORY — PX: IR US GUIDE VASC ACCESS RIGHT: IMG2390

## 2021-09-01 HISTORY — PX: IR ANGIO INTRA EXTRACRAN SEL INTERNAL CAROTID BILAT MOD SED: IMG5363

## 2021-09-01 HISTORY — PX: IR ANGIO VERTEBRAL SEL VERTEBRAL BILAT MOD SED: IMG5369

## 2021-09-01 HISTORY — PX: IR 3D INDEPENDENT WKST: IMG2385

## 2021-09-01 LAB — BASIC METABOLIC PANEL
Anion gap: 9 (ref 5–15)
BUN: 17 mg/dL (ref 8–23)
CO2: 25 mmol/L (ref 22–32)
Calcium: 8.7 mg/dL — ABNORMAL LOW (ref 8.9–10.3)
Chloride: 106 mmol/L (ref 98–111)
Creatinine, Ser: 0.65 mg/dL (ref 0.61–1.24)
GFR, Estimated: >60 mL/min (ref >60)
Glucose, Bld: 107 mg/dL — ABNORMAL HIGH (ref 70–99)
Potassium: 3.9 mmol/L (ref 3.5–5.1)
Sodium: 140 mmol/L (ref 135–145)

## 2021-09-01 LAB — CBC
HCT: 39.4 % (ref 39.0–52.0)
Hemoglobin: 13 g/dL (ref 13.0–17.0)
MCH: 31.2 pg (ref 26.0–34.0)
MCHC: 33 g/dL (ref 30.0–36.0)
MCV: 94.5 fL (ref 80.0–100.0)
Platelets: 196 10*3/uL (ref 150–400)
RBC: 4.17 MIL/uL — ABNORMAL LOW (ref 4.22–5.81)
RDW: 13.6 % (ref 11.5–15.5)
WBC: 9.2 10*3/uL (ref 4.0–10.5)
nRBC: 0 % (ref 0.0–0.2)

## 2021-09-01 LAB — PROTIME-INR
INR: 0.8 (ref 0.8–1.2)
Prothrombin Time: 11.1 seconds — ABNORMAL LOW (ref 11.4–15.2)

## 2021-09-01 MED ORDER — VERAPAMIL HCL 2.5 MG/ML IV SOLN
INTRAVENOUS | Status: AC
  Filled 2021-09-01: qty 2

## 2021-09-01 MED ORDER — VERAPAMIL HCL 2.5 MG/ML IV SOLN: INTRA_ARTERIAL | Status: AC | PRN

## 2021-09-01 MED ORDER — FENTANYL CITRATE (PF) 100 MCG/2ML IJ SOLN
INTRAMUSCULAR | Status: AC | PRN
Start: 2021-09-01 — End: 2021-09-01
  Administered 2021-09-01: 25 ug via INTRAVENOUS

## 2021-09-01 MED ORDER — MIDAZOLAM HCL 2 MG/2ML IJ SOLN
INTRAMUSCULAR | Status: AC | PRN
  Administered 2021-09-01: 1 mg via INTRAVENOUS

## 2021-09-01 MED ORDER — FENTANYL CITRATE (PF) 100 MCG/2ML IJ SOLN
INTRAMUSCULAR | Status: AC
  Filled 2021-09-01: qty 2

## 2021-09-01 MED ORDER — HEPARIN SODIUM (PORCINE) 1000 UNIT/ML IJ SOLN
INTRAMUSCULAR | Status: AC
  Filled 2021-09-01: qty 10

## 2021-09-01 MED ORDER — MIDAZOLAM HCL 2 MG/2ML IJ SOLN
INTRAMUSCULAR | Status: AC
  Filled 2021-09-01: qty 2

## 2021-09-01 MED ORDER — IOHEXOL 300 MG/ML  SOLN
100.0000 mL | Freq: Once | INTRAMUSCULAR | Status: AC | PRN
  Administered 2021-09-01: 89 mL via INTRA_ARTERIAL

## 2021-09-01 MED ORDER — NITROGLYCERIN 1 MG/10 ML FOR IR/CATH LAB: INTRA_ARTERIAL | Status: AC | PRN

## 2021-09-01 MED ORDER — NITROGLYCERIN 1 MG/10 ML FOR IR/CATH LAB
INTRA_ARTERIAL | Status: AC
  Filled 2021-09-01: qty 10

## 2021-09-01 MED ORDER — SODIUM CHLORIDE 0.9 % IV SOLN: Freq: Once | INTRAVENOUS | Status: AC

## 2021-09-01 MED ORDER — LIDOCAINE HCL (PF) 2 % IJ SOLN
INTRAMUSCULAR | Status: AC
  Filled 2021-09-01: qty 10

## 2021-09-01 NOTE — Procedures (Signed)
INTERVENTIONAL NEURORADIOLOGY BRIEF POSTPROCEDURE NOTE  DIAGNOSTIC CEREBRAL ANGIOGRAM    Attending: Dr. Erven Colla de Sindy Messing   Assistant: None.    Diagnosis: Right ICA and right MCA aneurysms.    Access site: Distal right radial artery.    Access closure: Inflatable band.    Anesthesia: Moderate sedation.    Medication used: 1 mg Versed IV; 25 mcg Fentanyl IV.   Complications: None.    Estimated blood loss: Negligible.    Specimen: None.    Findings: A 3 mm right MCA bifurcation aneurysm identified.  Luminal irregularity of the cervical right ICA with associated pseudoaneurysm.    The patient tolerated the procedure well without incident or complication and is in stable condition.  He will return for office consultation to discuss aneurysm management options.

## 2021-09-02 ENCOUNTER — Other Ambulatory Visit (HOSPITAL_COMMUNITY): Payer: Self-pay

## 2021-09-02 ENCOUNTER — Other Ambulatory Visit: Payer: Self-pay | Admitting: Physician Assistant

## 2021-09-06 ENCOUNTER — Other Ambulatory Visit (HOSPITAL_COMMUNITY): Payer: Self-pay

## 2021-09-07 ENCOUNTER — Other Ambulatory Visit (HOSPITAL_COMMUNITY): Payer: Self-pay | Admitting: Neuroradiology

## 2021-09-07 DIAGNOSIS — I671 Cerebral aneurysm, nonruptured: Secondary | ICD-10-CM

## 2021-09-08 ENCOUNTER — Telehealth: Payer: Self-pay | Admitting: Adult Health

## 2021-09-08 NOTE — Telephone Encounter (Signed)
Pt's sister, Vinnie Level (on [place]) inquiring about results of test done on 08/31/21 of neck and aneurysm near his brain. Would like a call from the nurse.

## 2021-09-08 NOTE — Telephone Encounter (Signed)
Contacted sister back, informed her IR placed order for those scans and will need to contact them for results. Number on order provided, she was appreciative.

## 2021-09-09 NOTE — Progress Notes (Signed)
Office Visit Note  Patient: Daniel Griffin             Date of Birth: [birth year]           MRN: [id number]             PCP: Chevis Pretty, FNP Referring: Chevis Pretty, * Visit Date: 09/12/2021 Occupation: @GUAROCC @  Subjective:  Pain in both knees   History of Present Illness: Daniel Griffin is a 61 y.o. male with history of seropositive rheumatoid arthritis and osteoarthritis. He is on orencia 125 mg sq injections once weekly.  He missed his dose of Orencia on Thursday due to requiring updated lab work and a refill.  He denies any signs or symptoms of a rheumatoid arthritis flare recently.  He states he had a left shoulder replacement performed by Dr. Alma Friendly on 05/13/2021.  His left shoulder joint pain and range of motion have improved significantly.  He has been endorsing with a right shoulder replacement once he has been cleared by his PCP.  In the future he would also like to have both knees replaced.  He is currently awaiting approval for Visco gel injections for both knees.  He denies any joint swelling at this time.  He denies any recent infections.      Activities of Daily Living:  Patient reports morning stiffness for 20-30 minutes.   Patient Reports nocturnal pain.  Difficulty dressing/grooming: Denies Difficulty climbing stairs: Reports Difficulty getting out of chair: Reports Difficulty using hands for taps, buttons, cutlery, and/or writing: Denies  Review of Systems  Constitutional:  Negative for fatigue.  HENT:  Negative for mouth sores, mouth dryness and nose dryness.   Eyes:  Negative for pain, itching and dryness.  Respiratory:  Negative for shortness of breath and difficulty breathing.   Cardiovascular:  Negative for chest pain and palpitations.  Gastrointestinal:  Negative for blood in stool, constipation and diarrhea.  Endocrine: Negative for increased urination.  Genitourinary:  Negative for difficulty urinating.  Musculoskeletal:   Positive for morning stiffness. Negative for joint pain, joint pain, joint swelling, myalgias, muscle tenderness and myalgias.  Skin:  Negative for color change, rash and redness.  Allergic/Immunologic: Negative for susceptible to infections.  Neurological:  Negative for dizziness, numbness, headaches, memory loss and weakness.  Hematological:  Positive for bruising/bleeding tendency.  Psychiatric/Behavioral:  Negative for confusion.    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   Pain in left knee 04/16/2019   Leukopenia 05/22/2018   Rheumatoid arthritis involving multiple sites with positive rheumatoid factor ([place]) 11/30/2016   High risk medications (not anticoagulants) long-term use 11/30/2016   Pain in both hands 11/30/2016   Pain in joints of both feet 11/30/2016    Past Medical History:  Diagnosis Date   Arthritis    Dementia ([place])    Seizures ([place]) 06/21/2020   ETOH w/d related    Family History  Problem Relation Age of Onset   Alzheimer's disease Father    Healthy Son    Healthy Daughter    Rheum arthritis Daughter    Healthy Daughter    Past Surgical History:  Procedure Laterality Date   ANKLE FRACTURE SURGERY     Left   APPENDECTOMY     IR 3D INDEPENDENT WKST  09/01/2021   IR ANGIO INTRA EXTRACRAN SEL INTERNAL  CAROTID BILAT MOD SED  09/01/2021   IR ANGIO VERTEBRAL SEL VERTEBRAL BILAT MOD SED  09/01/2021   IR US GUIDE VASC ACCESS RIGHT  09/01/2021   REVERSE SHOULDER ARTHROPLASTY Left 05/13/2021   Procedure: REVERSE SHOULDER ARTHROPLASTY;  Surgeon: Netta Cedars, MD;  Location: [facility];  Service: Orthopedics;  Laterality: Left;  with ISB   Social History   Social History Narrative   Lives alone   Right Handed   Rarely drinks caffeine       Immunization History  Administered Date(s) Administered    Influenza,inj,Quad PF,6+ Mos 07/04/2018     Objective: Vital Signs: BP 139/80 (BP Location: Right Arm, Patient Position: Sitting, Cuff Size: Normal)    Pulse (!) 57    Ht $R'5\' 6"'hC$  (1.676 m)    Wt 204 lb (92.5 kg)    BMI 32.93 kg/m    Physical Exam Vitals and nursing note reviewed.  Constitutional:      Appearance: He is well-developed.  HENT:     Head: Normocephalic and atraumatic.  Eyes:     Conjunctiva/sclera: Conjunctivae normal.     Pupils: Pupils are equal, round, and reactive to light.  Pulmonary:     Effort: Pulmonary effort is normal.  Abdominal:     Palpations: Abdomen is soft.  Musculoskeletal:     Cervical back: Normal range of motion and neck supple.  Skin:    General: Skin is warm and dry.     Capillary Refill: Capillary refill takes less than 2 seconds.  Neurological:     Mental Status: He is alert and oriented to person, place, and time.  Psychiatric:        Behavior: Behavior normal.     Musculoskeletal Exam: C-spine, thoracic spine, and lumbar spine have good ROM.  Right shoulder has painful and limited abduction and internal rotation.  Left shoulder replacement has good ROM.  Elbow joints, wrist joints, MCPs, PIPs, and DIPs good ROM with no synovitis.  Complete fist formation bilaterally.  PIP and DIP thickening consistent with osteoarthritis.  Hip joints, knee joints, and ankle joints have good ROM with no discomfort.  No warmth or effusion of knee joints.  No tenderness or swelling of ankle joints.   CDAI Exam: CDAI Score: 0.2  Patient Global: 1 mm; Provider Global: 1 mm Swollen: 0 ; Tender: 0  Joint Exam 09/12/2021   No joint exam has been documented for this visit   There is currently no information documented on the homunculus. Go to the Rheumatology activity and complete the homunculus joint exam.  Investigation: No additional findings.  Imaging: IR 3D Independent Darreld Mclean  Result Date: 09/01/2021 INDICATION: Daniel Griffin is a 61 y.o. male with a  past medical history of rheumatoid arthritis, prior alcohol abuse and recent left shoulder arthroplasty (05/13/2021) with incidental right MCA bifurcation aneurysm. Daniel Griffin head and episode of altered mental status November 2021 with no acute pathology identified on brain MRI. 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 He comes to our service for a diagnostic cerebral angiogram to evaluate CT findings. EXAM: ULTRASOUND-GUIDED VASCULAR ACCESS DIAGNOSTIC CEREBRAL ANGIOGRAM 3D ROTATIONAL ANGIOGRAM COMPARISON:  CT angiogram of the head and neck July 27, 2021 MEDICATIONS: 5,000 IU heparin, 5 mg Verapamil and 200 mcg nitroglycerin intra-arterially to right radial artery. ANESTHESIA/SEDATION: Moderate (conscious) sedation  was employed during this procedure. A total of Versed 1 mg and Fentanyl 25 mcg was administered intravenously by the radiology nurse. Total intra-service moderate Sedation Time: 68 minutes. The patient's level of consciousness and vital signs were monitored continuously by radiology nursing throughout the procedure under my direct supervision. CONTRAST:  89 mL Omnipaque 300 milligram/mL FLUOROSCOPY TIME:  Fluoroscopy Time: 15 minutes 36 seconds (463 mGy). COMPLICATIONS: None immediate. TECHNIQUE: Informed written consent was obtained from the patient after a thorough discussion of the procedural risks, benefits and alternatives. All questions were addressed. Maximal Sterile Barrier Technique was utilized including caps, mask, sterile gowns, sterile gloves, sterile drape, hand hygiene and skin antiseptic. A timeout was performed prior to the initiation of the procedure. Using the modified Seldinger technique and a micropuncture kit, access was gained to the distal right radial artery at the anatomical snuffbox and a 5 French  sheath was placed. Real-time ultrasound guidance was utilized for vascular access including the acquisition of a permanent ultrasound image documenting patency of the accessed vessel. Slow intra arterial infusion of 5,000 IU heparin, 5 mg Verapamil and 200 mcg nitroglycerin diluted in patient's own blood was performed. No significant fluctuation in patient's blood pressure seen. Then, a right radial artery angiogram was obtained via sheath side port. The right radial artery caliber is adequate for vascular access. Next, a 5 [place] Simmons 2 glide catheter was navigated over a 0.035" Terumo Glidewire into the right subclavian artery under fluoroscopic guidance. Using road map guidance, the catheter was advanced into the right vertebral artery. Frontal and lateral angiograms of the head were obtained. The catheter was then navigated into the aortic arch where the catheter tip was reformed. Catheter was then advanced into the left common carotid artery. Frontal and lateral angiograms of the neck were obtained. Using biplane roadmap guidance, the catheter was advanced into the left internal carotid artery. Frontal, lateral, magnified left anterior oblique and magnified lateral views of the head were obtained. The catheter was then placed into the left subclavian artery. Frontal angiogram of the neck were obtained. Using road map guidance, the catheter was advanced into the left vertebral artery. Frontal and lateral angiograms of the head were obtained. The catheter was subsequently placed into the right common carotid artery. Frontal, lateral and bilateral oblique views of the neck were obtained. Finally, the catheter was advanced into the right internal carotid artery under biplane roadmap guidance. Frontal and lateral angiograms of the head were obtained. 3D rotational angiograms were acquired and post processed in a separate workstation under concurrent attending physician supervision. Selected images were sent to  PACS. Then, multiple magnified oblique views of the head were obtained centered on the right MCA bifurcation. The catheter was subsequently withdrawn. An inflatable band was placed and inflated over the right hand access site. The vascular sheath was withdrawn and the band was slowly deflated until brisk flow was noted through the arteriotomy site. At this point, the band was reinflated with additional 2 cc of air to obtain patent hemostasis. FINDINGS: Right radial artery ultrasound and right radial artery angiogram: The caliber of the distal right radial artery is appropriate for angiogram access. The right radial artery originates directly from the axillary artery and has normal caliber. Right vertebral artery angiograms: The right vertebral artery, basilar artery, and bilateral posterior cerebral arteries are unremarkable. Luminal caliber is smooth and tapering. No aneurysms or abnormally high-flow, early draining veins are seen. No regions of abnormal hypervascularity are noted. The visualized dural sinuses are  patent. Left CCA angiograms: Cervical angiograms show normal course and caliber of the visualized left common carotid and internal carotid arteries. There are no significant stenoses. Left ICA angiograms: There is brisk vascular contrast filling of the left ACA and MCA vascular trees. Flash filling of the right ACA vascular tree noted via anterior communicating artery. Tiny infundibulum at the origin of the diminutive left posterior communicating artery. Luminal caliber is smooth and tapering. No aneurysms or abnormally high-flow, early draining veins are seen. No regions of abnormal hypervascularity are noted. The visualized dural sinuses are patent. Left subclavian artery angiogram: Normal course and caliber of the left subclavian artery. The cervical left vertebral artery has normal course and caliber throughout the visualized segment. Left vertebral artery angiograms: The left vertebral artery,  basilar artery, and bilateral posterior cerebral arteries are unremarkable. Luminal caliber is smooth and tapering. No aneurysms or abnormally high-flow, early draining veins are seen. No regions of abnormal hypervascularity are noted. The visualized dural sinuses are patent. Right CCA angiograms: Cervical angiograms show mild atherosclerotic changes at the right carotid bifurcation without hemodynamically significant stenosis. Luminal irregularity of the mid to distal cervical segment of the right ICA is noted suggesting sequela of prior dissection with approximately 35% stenosis. There is an associated 4 mm pseudoaneurysm projecting posteriorly. This pseudoaneurysm does not encroach on the arterial lumen. Right ICA angiograms: There is brisk vascular contrast filling of the right ACA and MCA vascular trees. Mild luminal irregularity in the cavernous segment of the right ICA suggesting minimal intracranial atherosclerotic disease without hemodynamically significant stenosis. A 3.1 x 2.5 mm saccular aneurysm is seen at the right MCA bifurcation. No abnormally high-flow, early draining veins are seen. No regions of abnormal hypervascularity are noted. The visualized dural sinuses are patent. PROCEDURE: No intervention performed. IMPRESSION: 1. 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 2. 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 PLAN: Patient will return for office consultation to discuss aneurysm management options. Electronically Signed   By: Pedro Earls M.D.   On: 09/01/2021 15:32   IR US Guide Vasc Access Right  Result Date: 09/01/2021 INDICATION: Daniel Griffin is a 61 y.o. male with a past medical history of rheumatoid arthritis, prior alcohol abuse and recent left shoulder arthroplasty (05/13/2021) with incidental right MCA bifurcation aneurysm. Mr. Ehmke  head and episode of altered mental status November 2021 with no acute pathology identified on brain MRI. 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 He comes to our service for a diagnostic cerebral angiogram to evaluate CT findings. EXAM: ULTRASOUND-GUIDED VASCULAR ACCESS DIAGNOSTIC CEREBRAL ANGIOGRAM 3D ROTATIONAL ANGIOGRAM COMPARISON:  CT angiogram of the head and neck July 27, 2021 MEDICATIONS: 5,000 IU heparin, 5 mg Verapamil and 200 mcg nitroglycerin intra-arterially to right radial artery. ANESTHESIA/SEDATION: Moderate (conscious) sedation was employed during this procedure. A total of Versed 1 mg and Fentanyl 25 mcg was administered intravenously by the radiology nurse. Total intra-service moderate Sedation Time: 68 minutes. The patient's level of consciousness and vital signs were monitored continuously by radiology nursing throughout the procedure under my direct supervision. CONTRAST:  89 mL Omnipaque 300 milligram/mL FLUOROSCOPY TIME:  Fluoroscopy Time: 15 minutes 36 seconds (463 mGy).  COMPLICATIONS: None immediate. TECHNIQUE: Informed written consent was obtained from the patient after a thorough discussion of the procedural risks, benefits and alternatives. All questions were addressed. Maximal Sterile Barrier Technique was utilized including caps, mask, sterile gowns, sterile gloves, sterile drape, hand hygiene and skin antiseptic. A timeout was performed prior to the initiation of the procedure. Using the modified Seldinger technique and a micropuncture kit, access was gained to the distal right radial artery at the anatomical snuffbox and a 5 French sheath was placed. Real-time ultrasound guidance was utilized for vascular access including the acquisition of a permanent ultrasound image documenting patency of the accessed  vessel. Slow intra arterial infusion of 5,000 IU heparin, 5 mg Verapamil and 200 mcg nitroglycerin diluted in patient's own blood was performed. No significant fluctuation in patient's blood pressure seen. Then, a right radial artery angiogram was obtained via sheath side port. The right radial artery caliber is adequate for vascular access. Next, a 5 [place] Simmons 2 glide catheter was navigated over a 0.035" Terumo Glidewire into the right subclavian artery under fluoroscopic guidance. Using road map guidance, the catheter was advanced into the right vertebral artery. Frontal and lateral angiograms of the head were obtained. The catheter was then navigated into the aortic arch where the catheter tip was reformed. Catheter was then advanced into the left common carotid artery. Frontal and lateral angiograms of the neck were obtained. Using biplane roadmap guidance, the catheter was advanced into the left internal carotid artery. Frontal, lateral, magnified left anterior oblique and magnified lateral views of the head were obtained. The catheter was then placed into the left subclavian artery. Frontal angiogram of the neck were obtained. Using road map guidance, the catheter was advanced into the left vertebral artery. Frontal and lateral angiograms of the head were obtained. The catheter was subsequently placed into the right common carotid artery. Frontal, lateral and bilateral oblique views of the neck were obtained. Finally, the catheter was advanced into the right internal carotid artery under biplane roadmap guidance. Frontal and lateral angiograms of the head were obtained. 3D rotational angiograms were acquired and post processed in a separate workstation under concurrent attending physician supervision. Selected images were sent to PACS. Then, multiple magnified oblique views of the head were obtained centered on the right MCA bifurcation. The catheter was subsequently withdrawn. An inflatable band was  placed and inflated over the right hand access site. The vascular sheath was withdrawn and the band was slowly deflated until brisk flow was noted through the arteriotomy site. At this point, the band was reinflated with additional 2 cc of air to obtain patent hemostasis. FINDINGS: Right radial artery ultrasound and right radial artery angiogram: The caliber of the distal right radial artery is appropriate for angiogram access. The right radial artery originates directly from the axillary artery and has normal caliber. Right vertebral artery angiograms: The right vertebral artery, basilar artery, and bilateral posterior cerebral arteries are unremarkable. Luminal caliber is smooth and tapering. No aneurysms or abnormally high-flow, early draining veins are seen. No regions of abnormal hypervascularity are noted. The visualized dural sinuses are patent. Left CCA angiograms: Cervical angiograms show normal course and caliber of the visualized left common carotid and internal carotid arteries. There are no significant stenoses. Left ICA angiograms: There is brisk vascular contrast filling of the left ACA and MCA vascular trees. Flash filling of the right ACA vascular tree noted via anterior communicating artery. Tiny infundibulum at the origin of the diminutive left posterior communicating artery.  Luminal caliber is smooth and tapering. No aneurysms or abnormally high-flow, early draining veins are seen. No regions of abnormal hypervascularity are noted. The visualized dural sinuses are patent. Left subclavian artery angiogram: Normal course and caliber of the left subclavian artery. The cervical left vertebral artery has normal course and caliber throughout the visualized segment. Left vertebral artery angiograms: The left vertebral artery, basilar artery, and bilateral posterior cerebral arteries are unremarkable. Luminal caliber is smooth and tapering. No aneurysms or abnormally high-flow, early draining veins are  seen. No regions of abnormal hypervascularity are noted. The visualized dural sinuses are patent. Right CCA angiograms: Cervical angiograms show mild atherosclerotic changes at the right carotid bifurcation without hemodynamically significant stenosis. Luminal irregularity of the mid to distal cervical segment of the right ICA is noted suggesting sequela of prior dissection with approximately 35% stenosis. There is an associated 4 mm pseudoaneurysm projecting posteriorly. This pseudoaneurysm does not encroach on the arterial lumen. Right ICA angiograms: There is brisk vascular contrast filling of the right ACA and MCA vascular trees. Mild luminal irregularity in the cavernous segment of the right ICA suggesting minimal intracranial atherosclerotic disease without hemodynamically significant stenosis. A 3.1 x 2.5 mm saccular aneurysm is seen at the right MCA bifurcation. No abnormally high-flow, early draining veins are seen. No regions of abnormal hypervascularity are noted. The visualized dural sinuses are patent. PROCEDURE: No intervention performed. IMPRESSION: 1. 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 2. 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 PLAN: Patient will return for office consultation to discuss aneurysm management options. Electronically Signed   By: Pedro Earls M.D.   On: 09/01/2021 15:32   IR ANGIO INTRA EXTRACRAN SEL INTERNAL CAROTID BILAT MOD SED  Result Date: 09/01/2021 INDICATION: Daniel Griffin is a 61 y.o. male with a past medical history of rheumatoid arthritis, prior alcohol abuse and recent left shoulder arthroplasty (05/13/2021) with incidental right MCA bifurcation aneurysm. Mr. Stalvey head and episode of altered mental status November 2021 with no acute pathology identified on brain MRI. 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 He comes to our service for a diagnostic cerebral angiogram to evaluate CT findings. EXAM: ULTRASOUND-GUIDED VASCULAR ACCESS DIAGNOSTIC CEREBRAL ANGIOGRAM 3D ROTATIONAL ANGIOGRAM COMPARISON:  CT angiogram of the head and neck July 27, 2021 MEDICATIONS: 5,000 IU heparin, 5 mg Verapamil and 200 mcg nitroglycerin intra-arterially to right radial artery. ANESTHESIA/SEDATION: Moderate (conscious) sedation was employed during this procedure. A total of Versed 1 mg and Fentanyl 25 mcg was administered intravenously by the radiology nurse. Total intra-service moderate Sedation Time: 68 minutes. The patient's level of consciousness and vital signs were monitored continuously by radiology nursing throughout the procedure under my direct supervision. CONTRAST:  89 mL Omnipaque 300 milligram/mL FLUOROSCOPY TIME:  Fluoroscopy Time: 15 minutes 36 seconds (463 mGy). COMPLICATIONS: None immediate. TECHNIQUE: Informed written consent was obtained from the patient after a thorough discussion of the procedural risks, benefits and alternatives. All questions were addressed. Maximal Sterile Barrier Technique was utilized including caps, mask, sterile gowns, sterile gloves, sterile drape, hand hygiene and skin antiseptic. A timeout was performed prior to the initiation of the procedure. Using the modified Seldinger technique and  a micropuncture kit, access was gained to the distal right radial artery at the anatomical snuffbox and a 5 French sheath was placed. Real-time ultrasound guidance was utilized for vascular access including the acquisition of a permanent ultrasound image documenting patency of the accessed vessel. Slow intra arterial infusion of 5,000 IU heparin, 5 mg Verapamil and 200 mcg nitroglycerin diluted in patient's own blood was performed.  No significant fluctuation in patient's blood pressure seen. Then, a right radial artery angiogram was obtained via sheath side port. The right radial artery caliber is adequate for vascular access. Next, a 5 [place] Simmons 2 glide catheter was navigated over a 0.035" Terumo Glidewire into the right subclavian artery under fluoroscopic guidance. Using road map guidance, the catheter was advanced into the right vertebral artery. Frontal and lateral angiograms of the head were obtained. The catheter was then navigated into the aortic arch where the catheter tip was reformed. Catheter was then advanced into the left common carotid artery. Frontal and lateral angiograms of the neck were obtained. Using biplane roadmap guidance, the catheter was advanced into the left internal carotid artery. Frontal, lateral, magnified left anterior oblique and magnified lateral views of the head were obtained. The catheter was then placed into the left subclavian artery. Frontal angiogram of the neck were obtained. Using road map guidance, the catheter was advanced into the left vertebral artery. Frontal and lateral angiograms of the head were obtained. The catheter was subsequently placed into the right common carotid artery. Frontal, lateral and bilateral oblique views of the neck were obtained. Finally, the catheter was advanced into the right internal carotid artery under biplane roadmap guidance. Frontal and lateral angiograms of the head were obtained. 3D rotational angiograms were acquired and post processed in a separate workstation under concurrent attending physician supervision. Selected images were sent to PACS. Then, multiple magnified oblique views of the head were obtained centered on the right MCA bifurcation. The catheter was subsequently withdrawn. An inflatable band was placed and inflated over the right hand access site. The vascular sheath was withdrawn and the band was slowly deflated until brisk flow was noted  through the arteriotomy site. At this point, the band was reinflated with additional 2 cc of air to obtain patent hemostasis. FINDINGS: Right radial artery ultrasound and right radial artery angiogram: The caliber of the distal right radial artery is appropriate for angiogram access. The right radial artery originates directly from the axillary artery and has normal caliber. Right vertebral artery angiograms: The right vertebral artery, basilar artery, and bilateral posterior cerebral arteries are unremarkable. Luminal caliber is smooth and tapering. No aneurysms or abnormally high-flow, early draining veins are seen. No regions of abnormal hypervascularity are noted. The visualized dural sinuses are patent. Left CCA angiograms: Cervical angiograms show normal course and caliber of the visualized left common carotid and internal carotid arteries. There are no significant stenoses. Left ICA angiograms: There is brisk vascular contrast filling of the left ACA and MCA vascular trees. Flash filling of the right ACA vascular tree noted via anterior communicating artery. Tiny infundibulum at the origin of the diminutive left posterior communicating artery. Luminal caliber is smooth and tapering. No aneurysms or abnormally high-flow, early draining veins are seen. No regions of abnormal hypervascularity are noted. The visualized dural sinuses are patent. Left subclavian artery angiogram: Normal course and caliber of the left subclavian artery. The cervical left vertebral artery has normal course and caliber throughout the visualized segment. Left vertebral artery angiograms: The left vertebral artery,  basilar artery, and bilateral posterior cerebral arteries are unremarkable. Luminal caliber is smooth and tapering. No aneurysms or abnormally high-flow, early draining veins are seen. No regions of abnormal hypervascularity are noted. The visualized dural sinuses are patent. Right CCA angiograms: Cervical angiograms show  mild atherosclerotic changes at the right carotid bifurcation without hemodynamically significant stenosis. Luminal irregularity of the mid to distal cervical segment of the right ICA is noted suggesting sequela of prior dissection with approximately 35% stenosis. There is an associated 4 mm pseudoaneurysm projecting posteriorly. This pseudoaneurysm does not encroach on the arterial lumen. Right ICA angiograms: There is brisk vascular contrast filling of the right ACA and MCA vascular trees. Mild luminal irregularity in the cavernous segment of the right ICA suggesting minimal intracranial atherosclerotic disease without hemodynamically significant stenosis. A 3.1 x 2.5 mm saccular aneurysm is seen at the right MCA bifurcation. No abnormally high-flow, early draining veins are seen. No regions of abnormal hypervascularity are noted. The visualized dural sinuses are patent. PROCEDURE: No intervention performed. IMPRESSION: 1. 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 2. 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 PLAN: Patient will return for office consultation to discuss aneurysm management options. Electronically Signed   By: Pedro Earls M.D.   On: 09/01/2021 15:32   IR ANGIO VERTEBRAL SEL VERTEBRAL BILAT MOD SED  Result Date: 09/01/2021 INDICATION: Daniel Griffin is a 61 y.o. male with a past medical history of rheumatoid arthritis, prior alcohol abuse and recent left shoulder arthroplasty (05/13/2021) with incidental right MCA bifurcation aneurysm. Mr. Casimir head and episode of altered mental status November 2021 with no acute pathology identified on brain MRI. 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 He comes to our service for a diagnostic cerebral angiogram to evaluate CT findings. EXAM: ULTRASOUND-GUIDED VASCULAR ACCESS DIAGNOSTIC CEREBRAL ANGIOGRAM 3D ROTATIONAL ANGIOGRAM COMPARISON:  CT angiogram of the head and neck July 27, 2021 MEDICATIONS: 5,000 IU heparin, 5 mg Verapamil and 200 mcg nitroglycerin intra-arterially to right radial artery. ANESTHESIA/SEDATION: Moderate (conscious) sedation was employed during this procedure. A total of Versed 1 mg and Fentanyl 25 mcg was administered intravenously by the radiology nurse. Total intra-service moderate Sedation Time: 68 minutes. The patient's level of consciousness and vital signs were monitored continuously by radiology nursing throughout the procedure under my direct supervision. CONTRAST:  89 mL Omnipaque 300 milligram/mL FLUOROSCOPY TIME:  Fluoroscopy Time: 15 minutes 36 seconds (463 mGy). COMPLICATIONS: None immediate. TECHNIQUE: Informed written consent was obtained from the patient after a thorough discussion of the procedural risks, benefits and alternatives. All questions were addressed. Maximal Sterile Barrier Technique was utilized including caps, mask, sterile gowns, sterile gloves, sterile drape, hand hygiene and skin antiseptic. A timeout was performed prior to the initiation of the procedure. Using the modified Seldinger technique and a micropuncture kit, access was gained to the distal right radial artery at the anatomical snuffbox and a 5 French sheath was placed. Real-time ultrasound guidance was utilized for vascular access including the acquisition of a permanent ultrasound image documenting patency of the accessed vessel. Slow intra arterial infusion of 5,000 IU heparin, 5 mg Verapamil and 200 mcg nitroglycerin diluted in patient's own blood was  performed. No significant fluctuation in patient's blood pressure seen. Then, a right radial artery angiogram was obtained via sheath side port. The right radial artery  caliber is adequate for vascular access. Next, a 5 [place] Simmons 2 glide catheter was navigated over a 0.035" Terumo Glidewire into the right subclavian artery under fluoroscopic guidance. Using road map guidance, the catheter was advanced into the right vertebral artery. Frontal and lateral angiograms of the head were obtained. The catheter was then navigated into the aortic arch where the catheter tip was reformed. Catheter was then advanced into the left common carotid artery. Frontal and lateral angiograms of the neck were obtained. Using biplane roadmap guidance, the catheter was advanced into the left internal carotid artery. Frontal, lateral, magnified left anterior oblique and magnified lateral views of the head were obtained. The catheter was then placed into the left subclavian artery. Frontal angiogram of the neck were obtained. Using road map guidance, the catheter was advanced into the left vertebral artery. Frontal and lateral angiograms of the head were obtained. The catheter was subsequently placed into the right common carotid artery. Frontal, lateral and bilateral oblique views of the neck were obtained. Finally, the catheter was advanced into the right internal carotid artery under biplane roadmap guidance. Frontal and lateral angiograms of the head were obtained. 3D rotational angiograms were acquired and post processed in a separate workstation under concurrent attending physician supervision. Selected images were sent to PACS. Then, multiple magnified oblique views of the head were obtained centered on the right MCA bifurcation. The catheter was subsequently withdrawn. An inflatable band was placed and inflated over the right hand access site. The vascular sheath was withdrawn and the band was slowly deflated until brisk flow was noted through the arteriotomy site. At this point, the band was reinflated with additional 2 cc of air to obtain patent hemostasis. FINDINGS: Right radial artery  ultrasound and right radial artery angiogram: The caliber of the distal right radial artery is appropriate for angiogram access. The right radial artery originates directly from the axillary artery and has normal caliber. Right vertebral artery angiograms: The right vertebral artery, basilar artery, and bilateral posterior cerebral arteries are unremarkable. Luminal caliber is smooth and tapering. No aneurysms or abnormally high-flow, early draining veins are seen. No regions of abnormal hypervascularity are noted. The visualized dural sinuses are patent. Left CCA angiograms: Cervical angiograms show normal course and caliber of the visualized left common carotid and internal carotid arteries. There are no significant stenoses. Left ICA angiograms: There is brisk vascular contrast filling of the left ACA and MCA vascular trees. Flash filling of the right ACA vascular tree noted via anterior communicating artery. Tiny infundibulum at the origin of the diminutive left posterior communicating artery. Luminal caliber is smooth and tapering. No aneurysms or abnormally high-flow, early draining veins are seen. No regions of abnormal hypervascularity are noted. The visualized dural sinuses are patent. Left subclavian artery angiogram: Normal course and caliber of the left subclavian artery. The cervical left vertebral artery has normal course and caliber throughout the visualized segment. Left vertebral artery angiograms: The left vertebral artery, basilar artery, and bilateral posterior cerebral arteries are unremarkable. Luminal caliber is smooth and tapering. No aneurysms or abnormally high-flow, early draining veins are seen. No regions of abnormal hypervascularity are noted. The visualized dural sinuses are patent. Right CCA angiograms: Cervical angiograms show mild atherosclerotic changes at the right carotid bifurcation without hemodynamically significant stenosis. Luminal irregularity of the mid to distal cervical  segment  of the right ICA is noted suggesting sequela of prior dissection with approximately 35% stenosis. There is an associated 4 mm pseudoaneurysm projecting posteriorly. This pseudoaneurysm does not encroach on the arterial lumen. Right ICA angiograms: There is brisk vascular contrast filling of the right ACA and MCA vascular trees. Mild luminal irregularity in the cavernous segment of the right ICA suggesting minimal intracranial atherosclerotic disease without hemodynamically significant stenosis. A 3.1 x 2.5 mm saccular aneurysm is seen at the right MCA bifurcation. No abnormally high-flow, early draining veins are seen. No regions of abnormal hypervascularity are noted. The visualized dural sinuses are patent. PROCEDURE: No intervention performed. IMPRESSION: 1. 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 2. 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 PLAN: Patient will return for office consultation to discuss aneurysm management options. Electronically Signed   By: Pedro Earls M.D.   On: 09/01/2021 15:32    Recent Labs: Lab Results  Component Value Date   WBC 9.2 09/01/2021   HGB 13.0 09/01/2021   PLT 196 09/01/2021   NA 140 09/01/2021   K 3.9 09/01/2021   CL 106 09/01/2021   CO2 25 09/01/2021   GLUCOSE 107 (H) 09/01/2021   BUN 17 09/01/2021   CREATININE 0.65 09/01/2021   BILITOT 0.7 07/18/2021   ALKPHOS 62 05/06/2021   AST 15 07/18/2021   ALT 16 07/18/2021   PROT 6.6 07/18/2021   ALBUMIN 4.2 05/06/2021   CALCIUM 8.7 (L) 09/01/2021   GFRAA 112 02/25/2021   QFTBGOLD NEGATIVE 07/03/2017   QFTBGOLDPLUS NEGATIVE 08/27/2020    Speciality Comments: Prior therapy includes: Enbrel (pancytopenia) and methotrexate (inadequate response) Orencia-07/09/18 Enbrel-1/19-10/19  Procedures:  No procedures performed Allergies: Patient has no known  allergies.   Assessment / Plan:     Visit Diagnoses: Rheumatoid arthritis involving multiple sites with positive rheumatoid factor [facility]): He has no joint tenderness or synovitis on examination today.  His rheumatoid arthritis has been well controlled on Orencia 125 mg sq injections once weekly.  He underwent a left total shoulder replacement by Dr. Alma Friendly on 05/13/2021 which has been doing well.  He continues to have chronic pain in the right shoulder and both knee joints.  He is planning to proceed with a right shoulder replacement once cleared by his PCP and would like to have knee replacements in the future.  He is currently awaiting approval for Visco gel injections for both knees.  Some warmth and swelling was noted in the right knee joint.  No knee joint effusions noted.  Overall his rheumatoid arthritis seems well controlled on the current treatment regimen.  He will remain on Orencia 125 mg subcutaneous injections once weekly.  TB Gold and hepatic function panel will be updated today.  A prescription for Maureen Chatters will be sent to the pharmacy.  He was advised to notify us if he develops increased joint pain or inflammation.  He will follow up in 5 months.   High risk medications (not anticoagulants) long-term use: Orencia 125 mg sq injections once weekly.   CBC and BMP updated on 09/01/21. Hepatic function panel will be drawn today.  TB gold negative on 08/27/20.  TB gold ordered today.  No recent infections. Discussed the importance of holding orencia if he develops signs or symptoms of an infection and to resume once the infection has completely cleared.   - Plan: QuantiFERON-TB Gold  Plus, Hepatic function panel  Screening for tuberculosis -Order for TB gold released today. Plan: QuantiFERON-TB Gold Plus  Chronic right shoulder pain: Chronic pain.  He has limited abduction to about 90 degrees as well as painful limited internal rotation.  He has an upcoming appointment with his PCP for clearance prior  to scheduling a right total shoulder replacement with Dr. Veverly Fells.  S/P shoulder replacement, left: Performed on 05/13/21 by Dr. Veverly Fells.  Doing well.  He has good range of motion with no discomfort at this time.  Primary osteoarthritis of both hands: He has PIP and DIP thickening consistent with osteoarthritis of both hands.  He has no tenderness or inflammation on examination today.  Complete fist formation bilaterally.  Chronic pain of both knees: He has chronic pain and stiffness in both knee joints.  He has warmth and swelling in the right knee.  No effusion noted.  He is currently awaiting approval for Visco gel injections for both knees.  In the future he would like to have knee replacements but plans on proceeding with a right total shoulder replacement first.  Primary osteoarthritis of both feet: He is not experiencing any discomfort in his feet at this time.   Other medical conditions are listed as follows:   Memory loss  Latent syphilis  History of alcohol abuse  History of neutropenia  Orders: Orders Placed This Encounter  Procedures   QuantiFERON-TB Gold Plus   Hepatic function panel   No orders of the defined types were placed in this encounter.    Follow-Up Instructions: Return in about 5 months (around 02/10/2022) for Rheumatoid arthritis, Osteoarthritis.   Ofilia Neas, PA-C  Note - This record has been created using Dragon software.  Chart creation errors have been sought, but may not always  have been located. Such creation errors do not reflect on  the standard of medical care.

## 2021-09-12 ENCOUNTER — Ambulatory Visit (INDEPENDENT_AMBULATORY_CARE_PROVIDER_SITE_OTHER): Payer: BC Managed Care – PPO | Admitting: Physician Assistant

## 2021-09-12 ENCOUNTER — Other Ambulatory Visit (HOSPITAL_COMMUNITY): Payer: Self-pay

## 2021-09-12 ENCOUNTER — Other Ambulatory Visit: Payer: Self-pay

## 2021-09-12 ENCOUNTER — Encounter: Payer: Self-pay | Admitting: Physician Assistant

## 2021-09-12 ENCOUNTER — Other Ambulatory Visit: Payer: Self-pay | Admitting: Nurse Practitioner

## 2021-09-12 VITALS — BP 139/80 | HR 57 | Ht 66.0 in | Wt 204.0 lb

## 2021-09-12 DIAGNOSIS — M0579 Rheumatoid arthritis with rheumatoid factor of multiple sites without organ or systems involvement: Secondary | ICD-10-CM

## 2021-09-12 DIAGNOSIS — F322 Major depressive disorder, single episode, severe without psychotic features: Secondary | ICD-10-CM

## 2021-09-12 DIAGNOSIS — M19072 Primary osteoarthritis, left ankle and foot: Secondary | ICD-10-CM

## 2021-09-12 DIAGNOSIS — A530 Latent syphilis, unspecified as early or late: Secondary | ICD-10-CM

## 2021-09-12 DIAGNOSIS — M25562 Pain in left knee: Secondary | ICD-10-CM

## 2021-09-12 DIAGNOSIS — M19041 Primary osteoarthritis, right hand: Secondary | ICD-10-CM

## 2021-09-12 DIAGNOSIS — Z79899 Other long term (current) drug therapy: Secondary | ICD-10-CM | POA: Diagnosis not present

## 2021-09-12 DIAGNOSIS — G8929 Other chronic pain: Secondary | ICD-10-CM

## 2021-09-12 DIAGNOSIS — M19042 Primary osteoarthritis, left hand: Secondary | ICD-10-CM

## 2021-09-12 DIAGNOSIS — Z862 Personal history of diseases of the blood and blood-forming organs and certain disorders involving the immune mechanism: Secondary | ICD-10-CM

## 2021-09-12 DIAGNOSIS — R413 Other amnesia: Secondary | ICD-10-CM

## 2021-09-12 DIAGNOSIS — Z111 Encounter for screening for respiratory tuberculosis: Secondary | ICD-10-CM

## 2021-09-12 DIAGNOSIS — M19071 Primary osteoarthritis, right ankle and foot: Secondary | ICD-10-CM

## 2021-09-12 DIAGNOSIS — F1011 Alcohol abuse, in remission: Secondary | ICD-10-CM

## 2021-09-12 DIAGNOSIS — M25511 Pain in right shoulder: Secondary | ICD-10-CM

## 2021-09-12 DIAGNOSIS — Z96612 Presence of left artificial shoulder joint: Secondary | ICD-10-CM

## 2021-09-12 DIAGNOSIS — M25561 Pain in right knee: Secondary | ICD-10-CM

## 2021-09-12 MED ORDER — ORENCIA CLICKJECT 125 MG/ML ~~LOC~~ SOAJ
125.0000 mg | SUBCUTANEOUS | 0 refills | Status: DC
  Filled 2021-09-12: qty 4, 28d supply, fill #0
  Filled 2021-10-07: qty 4, 28d supply, fill #1
  Filled 2021-11-16: qty 4, 28d supply, fill #2

## 2021-09-12 NOTE — Patient Instructions (Signed)
Standing Labs ?We placed an order today for your standing lab work.  ? ?Please have your standing labs drawn in April and every 3 months  ? ?If possible, please have your labs drawn 2 weeks prior to your appointment so that the provider can discuss your results at your appointment. ? ?Please note that you may see your imaging and lab results in MyChart before we have reviewed them. ?We may be awaiting multiple results to interpret others before contacting you. ?Please allow our office up to 72 hours to thoroughly review all of the results before contacting the office for clarification of your results. ? ?We have open lab daily: ?Monday through Thursday from 1:30-4:30 PM and Friday from 1:30-4:00 PM ?at the office of Dr. Shaili Deveshwar, [facility].   ?Please be advised, all patients with office appointments requiring lab work will take precedent over walk-in lab work.  ?If possible, please come for your lab work on Monday and Friday afternoons, as you may experience shorter wait times. ?The office is located at [address], [address], [place], [place] [address] ?No appointment is necessary.   ?Labs are drawn by [facility]. Please bring your co-pay at the time of your lab draw.  You may receive a bill from [facility] for your lab work. ? ?Please note if you are on Hydroxychloroquine and and an order has been placed for a Hydroxychloroquine level, you will need to have it drawn 4 hours or more after your last dose. ? ?If you wish to have your labs drawn at another location, please call the office 24 hours in advance to send orders. ? ?If you have any questions regarding directions or hours of operation,  ?please call [phone].   ?As a reminder, please drink plenty of water prior to coming for your lab work. Thanks! ? ?

## 2021-09-12 NOTE — Telephone Encounter (Signed)
Orencia refill is pended, please review and send to the pharmacy. Thanks!

## 2021-09-13 NOTE — Progress Notes (Signed)
Sister called procedural short stay trying to get results from pt's procedure back on 1/12. Gave her the number to IR in hopes she finds information she needs.

## 2021-09-13 NOTE — Progress Notes (Signed)
Hepatic function panel WNL

## 2021-09-14 ENCOUNTER — Encounter: Payer: Self-pay | Admitting: Nurse Practitioner

## 2021-09-14 ENCOUNTER — Ambulatory Visit (INDEPENDENT_AMBULATORY_CARE_PROVIDER_SITE_OTHER): Payer: BC Managed Care – PPO

## 2021-09-14 ENCOUNTER — Ambulatory Visit (INDEPENDENT_AMBULATORY_CARE_PROVIDER_SITE_OTHER): Payer: BC Managed Care – PPO | Admitting: Nurse Practitioner

## 2021-09-14 VITALS — BP 130/72 | HR 79 | Temp 97.4°F | Resp 20 | Ht 66.0 in | Wt 203.0 lb

## 2021-09-14 DIAGNOSIS — Z01818 Encounter for other preprocedural examination: Secondary | ICD-10-CM

## 2021-09-14 NOTE — Progress Notes (Signed)
° °  Subjective:    Patient ID: Daniel Griffin, male    DOB: 25-Oct-1960, 61 y.o.   MRN: [id number]   Chief Complaint: preoperative clearance  HPI  Patient is having shoulder surgery and needs preoperative clearance. He is doing well with no complaints.    Review of Systems  Constitutional:  Negative for diaphoresis.  Eyes:  Negative for pain.  Respiratory:  Negative for shortness of breath.   Cardiovascular:  Negative for chest pain, palpitations and leg swelling.  Gastrointestinal:  Negative for abdominal pain.  Endocrine: Negative for polydipsia.  Skin:  Negative for rash.  Neurological:  Negative for dizziness, weakness and headaches.  Hematological:  Does not bruise/bleed easily.  All other systems reviewed and are negative.     Objective:   Physical Exam Vitals and nursing note reviewed.  Constitutional:      Appearance: Normal appearance. He is well-developed.  HENT:     Head: Normocephalic.     Nose: Nose normal.     Mouth/Throat:     Mouth: Mucous membranes are moist.     Pharynx: Oropharynx is clear.  Eyes:     Pupils: Pupils are equal, round, and reactive to light.  Neck:     Thyroid: No thyroid mass or thyromegaly.     Vascular: No carotid bruit or JVD.     Trachea: Phonation normal.  Cardiovascular:     Rate and Rhythm: Normal rate and regular rhythm.  Pulmonary:     Effort: Pulmonary effort is normal. No respiratory distress.     Breath sounds: Normal breath sounds.  Abdominal:     General: Bowel sounds are normal.     Palpations: Abdomen is soft.     Tenderness: There is no abdominal tenderness.  Musculoskeletal:        General: Normal range of motion.     Cervical back: Normal range of motion and neck supple.  Lymphadenopathy:     Cervical: No cervical adenopathy.  Skin:    General: Skin is warm and dry.  Neurological:     Mental Status: He is alert and oriented to person, place, and time.  Psychiatric:        Behavior: Behavior normal.         Thought Content: Thought content normal.        Judgment: Judgment normal.    Chest xray clear-Preliminary reading by Ronnald Collum, FNP  [facility] EKG- NSR- Mary-Margaret Hassell Done, FNP       Assessment & Plan:   [name] in today with chief complaint of Surgical clearance   1. Pre-operative clearance Cleared for surgery - DG Chest 2 View - EKG 12-Lead    The above assessment and management plan was discussed with the patient. The patient verbalized understanding of and has agreed to the management plan. Patient is aware to call the clinic if symptoms persist or worsen. Patient is aware when to return to the clinic for a follow-up visit. Patient educated on when it is appropriate to go to the emergency department.   Mary-Margaret Hassell Done, FNP

## 2021-09-15 LAB — QUANTIFERON-TB GOLD PLUS
Mitogen-NIL: >10 IU/mL
NIL: 0.03 IU/mL
QuantiFERON-TB Gold Plus: NEGATIVE
TB1-NIL: 0.01 IU/mL
TB2-NIL: 0.01 IU/mL

## 2021-09-15 LAB — HEPATIC FUNCTION PANEL
AG Ratio: 1.6 (calc) (ref 1.0–2.5)
ALT: 14 U/L (ref 9–46)
AST: 12 U/L (ref 10–35)
Albumin: 3.9 g/dL (ref 3.6–5.1)
Alkaline phosphatase (APISO): 72 U/L (ref 35–144)
Bilirubin, Direct: 0.1 mg/dL (ref 0.0–0.2)
Globulin: 2.5 g/dL (calc) (ref 1.9–3.7)
Indirect Bilirubin: 0.5 mg/dL (calc) (ref 0.2–1.2)
Total Bilirubin: 0.6 mg/dL (ref 0.2–1.2)
Total Protein: 6.4 g/dL (ref 6.1–8.1)

## 2021-09-16 NOTE — Progress Notes (Signed)
TB gold negative

## 2021-09-26 ENCOUNTER — Other Ambulatory Visit: Payer: Self-pay

## 2021-09-26 ENCOUNTER — Ambulatory Visit (HOSPITAL_COMMUNITY)
Admission: RE | Admit: 2021-09-26 | Discharge: 2021-09-26 | Disposition: A | Discharge status: Home / Self Care | Payer: BC Managed Care – PPO | Source: Ambulatory Visit | Attending: Neuroradiology | Admitting: Neuroradiology

## 2021-09-26 DIAGNOSIS — I671 Cerebral aneurysm, nonruptured: Secondary | ICD-10-CM

## 2021-09-26 DIAGNOSIS — Z9889 Other specified postprocedural states: Secondary | ICD-10-CM | POA: Diagnosis not present

## 2021-09-26 NOTE — Progress Notes (Signed)
Referring Provider(s): Frann Rider, NP   Chief Complaint: The patient is seen in follow up today s/p dignostic cerebral angiogram to evaluate for right MCA bifurcation aneurysm.  History of present illness:  No significant interval change in history or symptoms. In summary, Daniel Griffin is a 61 y.o. 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  He underwent a diagnostic cerebral angiogram on September 01, 2021 that confirmed the presence of cervical and intracranial aneurysms.   Past Medical History:  Diagnosis Date   Arthritis    Dementia ([place])    Seizures ([place]) 06/21/2020   ETOH w/d related    Past Surgical History:  Procedure Laterality Date   ANKLE FRACTURE SURGERY     Left   APPENDECTOMY     IR 3D INDEPENDENT WKST  09/01/2021   IR ANGIO INTRA EXTRACRAN SEL INTERNAL CAROTID BILAT MOD SED  09/01/2021   IR ANGIO VERTEBRAL SEL VERTEBRAL BILAT MOD SED  09/01/2021   IR US GUIDE VASC ACCESS RIGHT  09/01/2021   REVERSE SHOULDER ARTHROPLASTY Left 05/13/2021   Procedure: REVERSE SHOULDER ARTHROPLASTY;  Surgeon: Netta Cedars, MD;  Location: [facility];  Service: Orthopedics;  Laterality: Left;  with ISB    Allergies: Patient has no known allergies.  Medications: Prior to Admission medications   Medication Sig Start Date End Date Taking? Authorizing Provider  Abatacept (ORENCIA CLICKJECT) 431 MG/ML SOAJ INJECT 125 MG INTO THE SKIN ONCE A WEEK. 09/12/21   Ofilia Neas, PA-C  buPROPion (WELLBUTRIN XL) 150 MG 24 hr tablet Take 1 tablet (150 mg total) by mouth daily. 04/21/21   Frann Rider, NP  escitalopram (LEXAPRO) 10 MG tablet TAKE 1 TABLET (10 MG TOTAL) BY MOUTH DAILY. (NEEDS TO BE SEEN BEFORE NEXT REFILL) 09/12/21   Hassell Done,  Mary-Margaret, FNP  methocarbamol (ROBAXIN) 500 MG tablet Take 1 tablet (500 mg total) by mouth every 6 (six) hours as needed for muscle spasms. 05/13/21   Netta Cedars, MD  ondansetron [facility]) 4 MG tablet Take 1 tablet (4 mg total) by mouth daily as needed for nausea or vomiting. 05/13/21 05/13/22  Netta Cedars, MD     Family History  Problem Relation Age of Onset   Alzheimer's disease Father    Healthy Son    Healthy Daughter    Rheum arthritis Daughter    Healthy Daughter     Social History   Socioeconomic History   Marital status: Single    Spouse name: Not on file   Number of children: Not on file   Years of education: Not on file   Highest education level: Not on file  Occupational History   Occupation: not worked since seizure 06/21/20  Tobacco Use   Smoking status: Never   Smokeless tobacco: Current    Types: Snuff, Chew  Vaping Use   Vaping Use: Never used  Substance and Sexual Activity   Alcohol use: Not Currently    Comment: Quit in 06/2020   Drug use: Yes    Frequency: 2.0 times per week    Types: Marijuana    Comment: occ   Sexual activity: Not on file  Other Topics Concern   Not on file  Social History Narrative   Lives alone   Right Handed  Rarely drinks caffeine       Social Determinants of Health   Financial Resource Strain: Not on file  Food Insecurity: Not on file  Transportation Needs: Not on file  Physical Activity: Not on file  Stress: Not on file  Social Connections: Not on file     Vital Signs: There were no vitals taken for this visit.  Physical Exam  Imaging: ULTRASOUND-GUIDED VASCULAR ACCESS   DIAGNOSTIC CEREBRAL ANGIOGRAM 3D ROTATIONAL ANGIOGRAM COMPARISON:  CT angiogram of the head and neck July 27, 2021 MEDICATIONS: 5,000 IU heparin, 5 mg Verapamil and 200 mcg nitroglycerin intra-arterially to right radial artery. ANESTHESIA/SEDATION: Moderate (conscious) sedation was employed during this procedure. A total of  Versed 1 mg and Fentanyl 25 mcg was administered intravenously by the radiology nurse. Total intra-service moderate Sedation Time: 68 minutes. The patient's level of consciousness and vital signs were monitored continuously by radiology nursing throughout the procedure under my direct supervision. CONTRAST:  89 mL Omnipaque 300 milligram/mL FLUOROSCOPY TIME:  Fluoroscopy Time: 15 minutes 36 seconds (463 mGy). COMPLICATIONS: None immediate. FINDINGS: Right radial artery ultrasound and right radial artery angiogram: The caliber of the distal right radial artery is appropriate for angiogram access. The right radial artery originates directly from the axillary artery and has normal caliber. Right vertebral artery angiograms: The right vertebral artery, basilar artery, and bilateral posterior cerebral arteries are unremarkable. Luminal caliber is smooth and tapering. No aneurysms or abnormally high-flow, early draining veins are seen. No regions of abnormal hypervascularity are noted. The visualized dural sinuses are patent. Left CCA angiograms: Cervical angiograms show normal course and caliber of the visualized left common carotid and internal carotid arteries. There are no significant stenoses. Left ICA angiograms: There is brisk vascular contrast filling of the left ACA and MCA vascular trees. Flash filling of the right ACA vascular tree noted via anterior communicating artery. Tiny infundibulum at the origin of the diminutive left posterior communicating artery. Luminal caliber is smooth and tapering. No aneurysms or abnormally high-flow, early draining veins are seen. No regions of abnormal hypervascularity are noted. The visualized dural sinuses are patent. Left subclavian artery angiogram: Normal course and caliber of the left subclavian artery. The cervical left vertebral artery has normal course and caliber throughout the visualized segment. Left vertebral artery angiograms:  The left vertebral artery, basilar artery, and bilateral posterior cerebral arteries are unremarkable. Luminal caliber is smooth and tapering. No aneurysms or abnormally high-flow, early draining veins are seen. No regions of abnormal hypervascularity are noted. The visualized dural sinuses are patent. Right CCA angiograms: Cervical angiograms show mild atherosclerotic changes at the right carotid bifurcation without hemodynamically significant stenosis. Luminal irregularity of the mid to distal cervical segment of the right ICA is noted suggesting sequela of prior dissection with approximately 35% stenosis. There is an associated 4 mm pseudoaneurysm projecting posteriorly. This pseudoaneurysm does not encroach on the arterial lumen. Right ICA angiograms: There is brisk vascular contrast filling of the right ACA and MCA vascular trees. Mild luminal irregularity in the cavernous segment of the right ICA suggesting minimal intracranial atherosclerotic disease without hemodynamically significant stenosis. A 3.1 x 2.5 mm saccular aneurysm is seen at the right MCA bifurcation. No abnormally high-flow, early draining veins are seen. No regions of abnormal hypervascularity are noted. The visualized dural sinuses are patent. IMPRESSION: 1. 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 2. 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 Electronically Signed   By: Pedro Earls M.D.   On: 09/01/2021 15:32  Labs:  CBC: Recent Labs    02/25/21 0833 05/06/21 1033 05/14/21 0324 07/18/21 0900 09/01/21 0639  WBC 11.2* 9.3  --  7.3 9.2  HGB 13.6 13.5 12.1* 13.7 13.0  HCT 41.0 41.3 35.8* 41.3 39.4  PLT 222 213  --  231 196    COAGS: Recent Labs    09/01/21 0639  INR 0.8    BMP: Recent Labs    02/25/21 0833 05/06/21 1033 05/14/21 0324  07/18/21 0900 09/01/21 0639  NA 140 141 137 142 140  K 4.7 4.4 3.9 4.3 3.9  CL 106 106 104 105 106  CO2 29 30 26 30 25   GLUCOSE 92 94 166* 97 107*  BUN 18 13 12 15 17   CALCIUM 8.9 9.4 8.4* 9.4 8.7*  CREATININE 0.81 0.70 0.79 0.89 0.65  GFRNONAA 97 >60 >60  --  >60  GFRAA 112  --   --   --   --     LIVER FUNCTION TESTS: Recent Labs    02/15/21 1528 02/25/21 0833 05/06/21 1033 07/18/21 0900 09/12/21 1021  BILITOT 0.6 0.8 0.9 0.7 0.6  AST 17 12 16 15 12   ALT 19 17 18 16 14   ALKPHOS 78  --  62  --   --   PROT 7.4 6.0* 7.0 6.6 6.4  ALBUMIN 4.5  --  4.2  --   --     Assessment:  I discussed the findings of the cerebral angiogram with the patient and his sister.  Management options were discussed including treatment and observation.  I informed them that the intracranial aneurysm is small and, although not zero, the risk of rupture is low.  I also informed them that currently, no treatment is needed for the post dissection changes with mild stenosis and pseudoaneurysm in the cervical right ICA.  Sister informed me that they are very anxious about the intracranial aneurysm and would like to have it treated.  Although I believe that the aneurysm can be primarily coiled without use of adjunct device such as a stent, sometimes the need for a stent becomes apparent during the procedure and that would require dual anti-platelet therapy for 3-6 months.  Because of that, patient would like to hold off on the procedure given that he is expected to undergo left shoulder replacement and has been waiting for phone call with the exact date of the surgery.  They will inform us their timeline once known.     Signed: Pedro Earls, MD 09/26/2021, 4:48 PM    I spent a total of    25 Minutes in face to face in clinical consultation, greater than 50% of which was counseling/coordinating care for brain aneurysm.

## 2021-09-30 ENCOUNTER — Other Ambulatory Visit (HOSPITAL_COMMUNITY): Payer: Self-pay

## 2021-10-03 NOTE — Progress Notes (Signed)
Surgery orders requested via Epic inbox. °

## 2021-10-04 DIAGNOSIS — M170 Bilateral primary osteoarthritis of knee: Secondary | ICD-10-CM | POA: Diagnosis not present

## 2021-10-04 NOTE — Patient Instructions (Addendum)
DUE TO COVID-19 ONLY ONE VISITOR IS ALLOWED TO COME WITH YOU AND STAY IN THE WAITING ROOM ONLY DURING PRE OP AND PROCEDURE.   **NO VISITORS ARE ALLOWED IN THE SHORT STAY AREA OR RECOVERY ROOM!!**  IF YOU WILL BE ADMITTED INTO THE HOSPITAL YOU ARE ALLOWED ONLY TWO SUPPORT PEOPLE DURING VISITATION HOURS ONLY (7 AM -8PM)   The support person(s) must pass our screening, gel in and out, and wear a mask at all times, including in the patients room. Patients must also wear a mask when staff or their support person are in the room. Visitors GUEST BADGE MUST BE WORN VISIBLY  One adult visitor may remain with you overnight and MUST be in the room by 8 P.M.  No visitors under the age of 61. Any visitor under the age of 70 must be accompanied by an adult.        Your procedure is scheduled on: 10/14/21   Report to [facility] Main Entrance    Report to admitting at: 7:05 AM   Call this number if you have problems the morning of surgery [phone]   Do not eat food :After Midnight.   May have liquids until: 6:50 AM    day of surgery  CLEAR LIQUID DIET  Foods Allowed                                                                     Foods Excluded  Water, Black Coffee and tea, regular and decaf                             liquids that you cannot  Plain Jell-O in any flavor  (No red)                                           see through such as: Fruit ices (not with fruit pulp)                                     milk, soups, orange juice              Iced Popsicles (No red)                                    All solid food                                   Apple juices Sports drinks like Gatorade (No red) Lightly seasoned clear broth or consume(fat free) Sugar Sample Menu Breakfast                                Lunch  Supper Cranberry juice                    Beef broth                            Chicken broth Jell-O                                      Grape juice                           Apple juice Coffee or tea                        Jell-O                                      Popsicle                                                Coffee or tea                        Coffee or tea    Complete one Ensure drink the morning of surgery 3 hours prior to scheduled surgery at: 6:50 AM.    The day of surgery:  Drink ONE (1) Pre-Surgery Clear Ensure or G2 at AM the morning of surgery. Drink in one sitting. Do not sip.  This drink was given to you during your hospital  pre-op appointment visit. Nothing else to drink after completing the  Pre-Surgery Clear Ensure or G2.          If you have questions, please contact your surgeons office.  FOLLOW BOWEL PREP AND ANY ADDITIONAL PRE OP INSTRUCTIONS YOU RECEIVED FROM YOUR SURGEON'S OFFICE!!!   Oral Hygiene is also important to reduce your risk of infection.                                    Remember - BRUSH YOUR TEETH THE MORNING OF SURGERY WITH YOUR REGULAR TOOTHPASTE   Do NOT smoke after Midnight   Take these medicines the morning of surgery with A SIP OF WATER: bupropion,escitalopram.  DO NOT TAKE ANY ORAL DIABETIC MEDICATIONS DAY OF YOUR SURGERY                              You may not have any metal on your body including hair pins, jewelry, and body piercing             Do not wear lotions, powders, perfumes/cologne, or deodorant              Men may shave face and neck.   Do not bring valuables to the hospital. [place].   Contacts, dentures or bridgework may not be worn into surgery.   Bring small overnight bag day  of surgery.    Patients discharged on the day of surgery will not be allowed to drive home.  Someone needs to stay with you for the first 24 hours after anesthesia.   Special Instructions: Bring a copy of your healthcare power of attorney and living will documents         the day of surgery if you haven't  scanned them before.              Please read over the following fact sheets you were given: IF YOU HAVE QUESTIONS ABOUT YOUR PRE-OP INSTRUCTIONS PLEASE CALL [phone]     [facility] - Preparing for Surgery Before surgery, you can play an important role.  Because skin is not sterile, your skin needs to be as free of germs as possible.  You can reduce the number of germs on your skin by washing with CHG (chlorahexidine gluconate) soap before surgery.  CHG is an antiseptic cleaner which kills germs and bonds with the skin to continue killing germs even after washing. Please DO NOT use if you have an allergy to CHG or antibacterial soaps.  If your skin becomes reddened/irritated stop using the CHG and inform your nurse when you arrive at Short Stay. Do not shave (including legs and underarms) for at least 48 hours prior to the first CHG shower.  You may shave your face/neck. Please follow these instructions carefully:  1.  Shower with CHG Soap the night before surgery and the  morning of Surgery.  2.  If you choose to wash your hair, wash your hair first as usual with your  normal  shampoo.  3.  After you shampoo, rinse your hair and body thoroughly to remove the  shampoo.                           4.  Use CHG as you would any other liquid soap.  You can apply chg directly  to the skin and wash                       Gently with a scrungie or clean washcloth.  5.  Apply the CHG Soap to your body ONLY FROM THE NECK DOWN.   Do not use on face/ open                           Wound or open sores. Avoid contact with eyes, ears mouth and genitals (private parts).                       Wash face,  Genitals (private parts) with your normal soap.             6.  Wash thoroughly, paying special attention to the area where your surgery  will be performed.  7.  Thoroughly rinse your body with warm water from the neck down.  8.  DO NOT shower/wash with your normal soap after using and rinsing off  the CHG  Soap.                9.  Pat yourself dry with a clean towel.            10.  Wear clean pajamas.            11.  Place clean sheets on your bed the night of your first shower and  do not  sleep with pets. Day of Surgery : Do not apply any lotions/deodorants the morning of surgery.  Please wear clean clothes to the hospital/surgery center.  FAILURE TO FOLLOW THESE INSTRUCTIONS MAY RESULT IN THE CANCELLATION OF YOUR SURGERY PATIENT SIGNATURE_________________________________  NURSE SIGNATURE__________________________________  ________________________________________________________________________ [facility]- Preparing for Total Shoulder Arthroplasty    Before surgery, you can play an important role. Because skin is not sterile, your skin needs to be as free of germs as possible. You can reduce the number of germs on your skin by using the following products. Benzoyl Peroxide Gel Reduces the number of germs present on the skin Applied twice a day to shoulder area starting two days before surgery    ==================================================================  Please follow these instructions carefully:  BENZOYL PEROXIDE 5% GEL  Please do not use if you have an allergy to benzoyl peroxide.   If your skin becomes reddened/irritated stop using the benzoyl peroxide.  Starting two days before surgery, apply as follows: Apply benzoyl peroxide in the morning and at night. Apply after taking a shower. If you are not taking a shower clean entire shoulder front, back, and side along with the armpit with a clean wet washcloth.  Place a quarter-sized dollop on your shoulder and rub in thoroughly, making sure to cover the front, back, and side of your shoulder, along with the armpit.   2 days before ____ AM   ____ PM              1 day before ____ AM   ____ PM                         Do this twice a day for two days.  (Last application is the night before surgery, AFTER using the CHG soap as  described below).  Do NOT apply benzoyl peroxide gel on the day of surgery.   Incentive Spirometer  An incentive spirometer is a tool that can help keep your lungs clear and active. This tool measures how well you are filling your lungs with each breath. Taking long deep breaths may help reverse or decrease the chance of developing breathing (pulmonary) problems (especially infection) following: A long period of time when you are unable to move or be active. BEFORE THE PROCEDURE  If the spirometer includes an indicator to show your best effort, your nurse or respiratory therapist will set it to a desired goal. If possible, sit up straight or lean slightly forward. Try not to slouch. Hold the incentive spirometer in an upright position. INSTRUCTIONS FOR USE  Sit on the edge of your bed if possible, or sit up as far as you can in bed or on a chair. Hold the incentive spirometer in an upright position. Breathe out normally. Place the mouthpiece in your mouth and seal your lips tightly around it. Breathe in slowly and as deeply as possible, raising the piston or the ball toward the top of the column. Hold your breath for 3-5 seconds or for as long as possible. Allow the piston or ball to fall to the bottom of the column. Remove the mouthpiece from your mouth and breathe out normally. Rest for a few seconds and repeat Steps 1 through 7 at least 10 times every 1-2 hours when you are awake. Take your time and take a few normal breaths between deep breaths. The spirometer may include an indicator to show your best effort. Use the indicator as a  goal to work toward during each repetition. After each set of 10 deep breaths, practice coughing to be sure your lungs are clear. If you have an incision (the cut made at the time of surgery), support your incision when coughing by placing a pillow or rolled up towels firmly against it. Once you are able to get out of bed, walk around indoors and cough well.  You may stop using the incentive spirometer when instructed by your caregiver.  RISKS AND COMPLICATIONS Take your time so you do not get dizzy or light-headed. If you are in pain, you may need to take or ask for pain medication before doing incentive spirometry. It is harder to take a deep breath if you are having pain. AFTER USE Rest and breathe slowly and easily. It can be helpful to keep track of a log of your progress. Your caregiver can provide you with a simple table to help with this. If you are using the spirometer at home, follow these instructions: [place] IF:  You are having difficultly using the spirometer. You have trouble using the spirometer as often as instructed. Your pain medication is not giving enough relief while using the spirometer. You develop fever of 100.5 F (38.1 C) or higher. SEEK IMMEDIATE MEDICAL CARE IF:  You cough up bloody sputum that had not been present before. You develop fever of 102 F (38.9 C) or greater. You develop worsening pain at or near the incision site. MAKE SURE YOU:  Understand these instructions. Will watch your condition. Will get help right away if you are not doing well or get worse. Document Released: 12/18/2006 Document Revised: 10/30/2011 Document Reviewed: 02/18/2007 [facility] Patient Information 2014 [place], [place].   ________________________________________________________________________

## 2021-10-04 NOTE — H&P (Signed)
Patient's anticipated LOS is less than 2 midnights, meeting these requirements: - Younger than 7 - Lives within 1 hour of care - Has a competent adult at home to recover with post-op recover - NO history of  - Chronic pain requiring opiods  - Diabetes  - Coronary Artery Disease  - Heart failure  - Heart attack  - Stroke  - DVT/VTE  - Cardiac arrhythmia  - Respiratory Failure/COPD  - Renal failure  - Anemia  - Advanced Liver disease     Daniel Griffin is an 61 y.o. male.    Chief Complaint: right shoulder pain  HPI: Pt is a 61 y.o. male complaining of right shoulder pain for multiple years. Pain had continually increased since the beginning. X-rays in the clinic show end-stage arthritic changes of the right shoulder. Pt has tried various conservative treatments which have failed to alleviate their symptoms, including injections and therapy. Various options are discussed with the patient. Risks, benefits and expectations were discussed with the patient. Patient understand the risks, benefits and expectations and wishes to proceed with surgery.   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 His smokeless tobacco use includes snuff and chew. He reports that he does not currently use  alcohol. He reports current drug use. Frequency: 2.00 times per week. Drug: Marijuana.  Allergies:  No Known Allergies  Medications: No current facility-administered medications for this encounter.   Current Outpatient Medications  Medication Sig Dispense Refill   Abatacept (ORENCIA CLICKJECT) 045 MG/ML SOAJ INJECT 125 MG INTO THE SKIN ONCE A WEEK. 12 mL 0   buPROPion (WELLBUTRIN XL) 150 MG 24 hr tablet Take 1 tablet (150 mg total) by mouth daily. 90 tablet 3   escitalopram (LEXAPRO) 10 MG tablet TAKE 1 TABLET (10 MG TOTAL) BY MOUTH DAILY. (NEEDS TO BE SEEN BEFORE NEXT REFILL) 30 tablet 0   ondansetron (ZOFRAN) 4 MG tablet Take 1 tablet (4 mg total) by mouth daily as needed for nausea or vomiting. 30 tablet 1   methocarbamol (ROBAXIN) 500 MG tablet Take 1 tablet (500 mg total) by mouth every 6 (six) hours as needed for muscle spasms. (Patient not taking: Reported on 10/03/2021) 40 tablet 1    No results found for this or any previous visit (from the past 48 hour(s)). No results found.  ROS: Pain with rom of the right upper extremity  Physical Exam: Alert and oriented 61 y.o. male in no acute distress Cranial nerves 2-12 intact Cervical spine: full rom with no tenderness, nv intact distally Chest: active breath sounds bilaterally, no wheeze rhonchi or rales Heart: regular rate and rhythm, no murmur Abd: non tender non distended with active  bowel sounds Hip is stable with rom  Right shoulder with painful rom and weakness Nv intact distally No rashes or edema distally  Assessment/Plan Assessment: right shoulder cuff arthropathy  Plan:  Patient will undergo a right reverse total shoulder by Dr. Veverly Fells at [facility] R[facility] benefits and expectations were discussed with the patient. Patient understand risks, benefits and expectations and wishes to proceed. Preoperative templating of the joint replacement has been completed, documented, and submitted to the Operating Room personnel in  order to optimize intra-operative equipment management.   Merla Riches PA-C, MPAS [facility] is now [facility] [employer] [address] [place], [place], [place] [address] Phone: [phone] [email or web address]   [employer]

## 2021-10-05 ENCOUNTER — Other Ambulatory Visit: Payer: Self-pay

## 2021-10-05 ENCOUNTER — Encounter (HOSPITAL_COMMUNITY)
Admission: RE | Admit: 2021-10-05 | Discharge: 2021-10-05 | Disposition: A | Discharge status: Home / Self Care | Payer: BC Managed Care – PPO | Source: Ambulatory Visit | Attending: Orthopedic Surgery | Admitting: Orthopedic Surgery

## 2021-10-05 ENCOUNTER — Encounter (HOSPITAL_COMMUNITY): Payer: Self-pay

## 2021-10-05 VITALS — BP 137/76 | HR 61 | Temp 98.0°F | Resp 18 | Ht 67.0 in | Wt 199.4 lb

## 2021-10-05 DIAGNOSIS — Z01812 Encounter for preprocedural laboratory examination: Secondary | ICD-10-CM | POA: Diagnosis not present

## 2021-10-05 DIAGNOSIS — Z01818 Encounter for other preprocedural examination: Secondary | ICD-10-CM

## 2021-10-05 HISTORY — DX: Depression, unspecified: F32.A

## 2021-10-05 LAB — CBC
HCT: 39.6 % (ref 39.0–52.0)
Hemoglobin: 13.3 g/dL (ref 13.0–17.0)
MCH: 32 pg (ref 26.0–34.0)
MCHC: 33.6 g/dL (ref 30.0–36.0)
MCV: 95.4 fL (ref 80.0–100.0)
Platelets: 222 10*3/uL (ref 150–400)
RBC: 4.15 MIL/uL — ABNORMAL LOW (ref 4.22–5.81)
RDW: 13.2 % (ref 11.5–15.5)
WBC: 8.6 10*3/uL (ref 4.0–10.5)
nRBC: 0 % (ref 0.0–0.2)

## 2021-10-05 LAB — SURGICAL PCR SCREEN
MRSA, PCR: NEGATIVE
Staphylococcus aureus: NEGATIVE

## 2021-10-05 NOTE — Progress Notes (Signed)
COVID Vaccine Completed: NO Date COVID Vaccine completed: COVID vaccine manufacturer: [place] Test: N/A Bowel prep reminder:N/A  PCP - Maryruth Bun: FNP Cardiologist -   Chest x-ray -  EKG -  Stress Test -  ECHO -  Cardiac Cath -  Pacemaker/ICD device last checked:  Sleep Study -  CPAP -   Fasting Blood Sugar -  Checks Blood Sugar _____ times a day  Blood Thinner Instructions: Aspirin Instructions: Last Dose:  Anesthesia review: Hx: Seizure(ETOH w/d related)  Patient denies shortness of breath, fever, cough and chest pain at PAT appointment   Patient verbalized understanding of instructions that were given to them at the PAT appointment. Patient was also instructed that they will need to review over the PAT instructions again at home before surgery.

## 2021-10-07 ENCOUNTER — Other Ambulatory Visit (HOSPITAL_COMMUNITY): Payer: Self-pay

## 2021-10-09 ENCOUNTER — Other Ambulatory Visit: Payer: Self-pay | Admitting: Nurse Practitioner

## 2021-10-09 DIAGNOSIS — F322 Major depressive disorder, single episode, severe without psychotic features: Secondary | ICD-10-CM

## 2021-10-11 DIAGNOSIS — M170 Bilateral primary osteoarthritis of knee: Secondary | ICD-10-CM | POA: Diagnosis not present

## 2021-10-14 ENCOUNTER — Ambulatory Visit (HOSPITAL_COMMUNITY): Payer: BC Managed Care – PPO | Admitting: Certified Registered Nurse Anesthetist

## 2021-10-14 ENCOUNTER — Ambulatory Visit (HOSPITAL_COMMUNITY)
Admission: RE | Admit: 2021-10-14 | Discharge: 2021-10-14 | Disposition: A | Discharge status: Home / Self Care | Payer: BC Managed Care – PPO | Attending: Orthopedic Surgery | Admitting: Orthopedic Surgery

## 2021-10-14 ENCOUNTER — Encounter (HOSPITAL_COMMUNITY): Payer: Self-pay | Admitting: Orthopedic Surgery

## 2021-10-14 ENCOUNTER — Encounter (HOSPITAL_COMMUNITY)
Admission: RE | Disposition: A | Discharge status: Home / Self Care | Payer: Self-pay | Source: Home / Self Care | Attending: Orthopedic Surgery

## 2021-10-14 ENCOUNTER — Other Ambulatory Visit: Payer: Self-pay

## 2021-10-14 ENCOUNTER — Ambulatory Visit (HOSPITAL_COMMUNITY): Payer: BC Managed Care – PPO

## 2021-10-14 DIAGNOSIS — F32A Depression, unspecified: Secondary | ICD-10-CM | POA: Insufficient documentation

## 2021-10-14 DIAGNOSIS — F0390 Unspecified dementia without behavioral disturbance: Secondary | ICD-10-CM | POA: Insufficient documentation

## 2021-10-14 DIAGNOSIS — M75101 Unspecified rotator cuff tear or rupture of right shoulder, not specified as traumatic: Secondary | ICD-10-CM | POA: Diagnosis not present

## 2021-10-14 DIAGNOSIS — G8918 Other acute postprocedural pain: Secondary | ICD-10-CM | POA: Diagnosis not present

## 2021-10-14 DIAGNOSIS — M75121 Complete rotator cuff tear or rupture of right shoulder, not specified as traumatic: Secondary | ICD-10-CM | POA: Diagnosis not present

## 2021-10-14 DIAGNOSIS — Z96611 Presence of right artificial shoulder joint: Secondary | ICD-10-CM

## 2021-10-14 DIAGNOSIS — M19011 Primary osteoarthritis, right shoulder: Secondary | ICD-10-CM | POA: Diagnosis not present

## 2021-10-14 DIAGNOSIS — M12811 Other specific arthropathies, not elsewhere classified, right shoulder: Secondary | ICD-10-CM | POA: Diagnosis not present

## 2021-10-14 HISTORY — PX: REVERSE SHOULDER ARTHROPLASTY: SHX5054

## 2021-10-14 SURGERY — ARTHROPLASTY, SHOULDER, TOTAL, REVERSE
Anesthesia: General | Site: Shoulder | Laterality: Right

## 2021-10-14 MED ORDER — LIDOCAINE HCL (CARDIAC) PF 100 MG/5ML IV SOSY
PREFILLED_SYRINGE | INTRAVENOUS | Status: DC | PRN
  Administered 2021-10-14: 100 mg via INTRAVENOUS

## 2021-10-14 MED ORDER — ACETAMINOPHEN 500 MG PO TABS
1000.0000 mg | ORAL_TABLET | Freq: Four times a day (QID) | ORAL | Status: DC | PRN
  Administered 2021-10-14: 1000 mg via ORAL

## 2021-10-14 MED ORDER — PROPOFOL 10 MG/ML IV BOLUS
INTRAVENOUS | Status: DC | PRN
  Administered 2021-10-14: 150 mg via INTRAVENOUS

## 2021-10-14 MED ORDER — LIDOCAINE HCL (PF) 2 % IJ SOLN
INTRAMUSCULAR | Status: AC
  Filled 2021-10-14: qty 5

## 2021-10-14 MED ORDER — BUPIVACAINE HCL (PF) 0.5 % IJ SOLN
INTRAMUSCULAR | Status: DC | PRN
  Administered 2021-10-14: 15 mL via PERINEURAL

## 2021-10-14 MED ORDER — ONDANSETRON HCL 4 MG PO TABS: 4.0000 mg | ORAL_TABLET | Freq: Three times a day (TID) | ORAL | 1 refills | Status: DC | PRN

## 2021-10-14 MED ORDER — BUPIVACAINE-EPINEPHRINE (PF) 0.25% -1:200000 IJ SOLN
INTRAMUSCULAR | Status: DC | PRN
  Administered 2021-10-14: 30 mL via PERINEURAL

## 2021-10-14 MED ORDER — FENTANYL CITRATE PF 50 MCG/ML IJ SOSY
50.0000 ug | PREFILLED_SYRINGE | INTRAMUSCULAR | Status: DC
  Administered 2021-10-14: 50 ug via INTRAVENOUS
  Filled 2021-10-14: qty 2

## 2021-10-14 MED ORDER — MIDAZOLAM HCL 2 MG/2ML IJ SOLN
1.0000 mg | INTRAMUSCULAR | Status: DC
  Administered 2021-10-14: 1 mg via INTRAVENOUS
  Filled 2021-10-14: qty 2

## 2021-10-14 MED ORDER — BUPIVACAINE LIPOSOME 1.3 % IJ SUSP
INTRAMUSCULAR | Status: DC | PRN
  Administered 2021-10-14: 10 mL

## 2021-10-14 MED ORDER — ROCURONIUM BROMIDE 10 MG/ML (PF) SYRINGE
PREFILLED_SYRINGE | INTRAVENOUS | Status: AC
  Filled 2021-10-14: qty 10

## 2021-10-14 MED ORDER — SODIUM CHLORIDE 0.9 % IR SOLN
Status: DC | PRN
  Administered 2021-10-14: 1000 mL

## 2021-10-14 MED ORDER — ACETAMINOPHEN 500 MG PO TABS
ORAL_TABLET | ORAL | Status: AC
  Filled 2021-10-14: qty 2

## 2021-10-14 MED ORDER — OXYCODONE HCL 5 MG/5ML PO SOLN: 5.0000 mg | Freq: Once | ORAL | Status: DC | PRN

## 2021-10-14 MED ORDER — EPHEDRINE SULFATE-NACL 50-0.9 MG/10ML-% IV SOSY
PREFILLED_SYRINGE | INTRAVENOUS | Status: DC | PRN
  Administered 2021-10-14: 10 mg via INTRAVENOUS
  Administered 2021-10-14: 5 mg via INTRAVENOUS

## 2021-10-14 MED ORDER — ONDANSETRON HCL 4 MG/2ML IJ SOLN: 4.0000 mg | Freq: Once | INTRAMUSCULAR | Status: DC | PRN

## 2021-10-14 MED ORDER — OXYCODONE-ACETAMINOPHEN 5-325 MG PO TABS: 1.0000 | ORAL_TABLET | ORAL | 0 refills | Status: DC | PRN

## 2021-10-14 MED ORDER — CEFAZOLIN SODIUM-DEXTROSE 2-4 GM/100ML-% IV SOLN
2.0000 g | INTRAVENOUS | Status: AC
  Administered 2021-10-14: 2 g via INTRAVENOUS
  Filled 2021-10-14: qty 100

## 2021-10-14 MED ORDER — FENTANYL CITRATE PF 50 MCG/ML IJ SOSY: 25.0000 ug | PREFILLED_SYRINGE | INTRAMUSCULAR | Status: DC | PRN

## 2021-10-14 MED ORDER — ROCURONIUM BROMIDE 100 MG/10ML IV SOLN
INTRAVENOUS | Status: DC | PRN
  Administered 2021-10-14: 70 mg via INTRAVENOUS

## 2021-10-14 MED ORDER — METHOCARBAMOL 500 MG PO TABS
500.0000 mg | ORAL_TABLET | Freq: Four times a day (QID) | ORAL | 1 refills | Status: DC | PRN
Start: 2021-10-14 — End: 2022-01-31

## 2021-10-14 MED ORDER — DEXAMETHASONE SODIUM PHOSPHATE 10 MG/ML IJ SOLN
INTRAMUSCULAR | Status: DC | PRN
  Administered 2021-10-14: 10 mg via INTRAVENOUS

## 2021-10-14 MED ORDER — GLYCOPYRROLATE 0.2 MG/ML IJ SOLN
INTRAMUSCULAR | Status: DC | PRN
  Administered 2021-10-14 (×2): .1 mg via INTRAVENOUS

## 2021-10-14 MED ORDER — BUPIVACAINE-EPINEPHRINE (PF) 0.25% -1:200000 IJ SOLN
INTRAMUSCULAR | Status: AC
  Filled 2021-10-14: qty 30

## 2021-10-14 MED ORDER — DEXAMETHASONE SODIUM PHOSPHATE 10 MG/ML IJ SOLN
INTRAMUSCULAR | Status: AC
  Filled 2021-10-14: qty 1

## 2021-10-14 MED ORDER — OXYCODONE HCL 5 MG PO TABS: 5.0000 mg | ORAL_TABLET | Freq: Once | ORAL | Status: DC | PRN

## 2021-10-14 MED ORDER — SUGAMMADEX SODIUM 200 MG/2ML IV SOLN
INTRAVENOUS | Status: DC | PRN
  Administered 2021-10-14: 200 mg via INTRAVENOUS

## 2021-10-14 MED ORDER — ORAL CARE MOUTH RINSE: 15.0000 mL | Freq: Once | OROMUCOSAL | Status: AC

## 2021-10-14 MED ORDER — ONDANSETRON HCL 4 MG/2ML IJ SOLN
INTRAMUSCULAR | Status: AC
  Filled 2021-10-14: qty 2

## 2021-10-14 MED ORDER — PHENYLEPHRINE 40 MCG/ML (10ML) SYRINGE FOR IV PUSH (FOR BLOOD PRESSURE SUPPORT)
PREFILLED_SYRINGE | INTRAVENOUS | Status: DC | PRN
  Administered 2021-10-14: 80 ug via INTRAVENOUS

## 2021-10-14 MED ORDER — PROPOFOL 10 MG/ML IV BOLUS
INTRAVENOUS | Status: AC
  Filled 2021-10-14: qty 20

## 2021-10-14 MED ORDER — CHLORHEXIDINE GLUCONATE 0.12 % MT SOLN
15.0000 mL | Freq: Once | OROMUCOSAL | Status: AC
  Administered 2021-10-14: 15 mL via OROMUCOSAL

## 2021-10-14 MED ORDER — AMISULPRIDE (ANTIEMETIC) 5 MG/2ML IV SOLN: 10.0000 mg | Freq: Once | INTRAVENOUS | Status: DC | PRN

## 2021-10-14 MED ORDER — FENTANYL CITRATE (PF) 100 MCG/2ML IJ SOLN
INTRAMUSCULAR | Status: DC | PRN
  Administered 2021-10-14 (×2): 50 ug via INTRAVENOUS

## 2021-10-14 MED ORDER — FENTANYL CITRATE (PF) 100 MCG/2ML IJ SOLN
INTRAMUSCULAR | Status: AC
  Filled 2021-10-14: qty 2

## 2021-10-14 MED ORDER — ONDANSETRON HCL 4 MG/2ML IJ SOLN
INTRAMUSCULAR | Status: DC | PRN
  Administered 2021-10-14: 4 mg via INTRAVENOUS

## 2021-10-14 MED ORDER — LACTATED RINGERS IV SOLN: INTRAVENOUS | Status: DC

## 2021-10-14 SURGICAL SUPPLY — 72 items
BAG COUNTER SPONGE SURGICOUNT (BAG) IMPLANT
BAG ZIPLOCK 12X15 (MISCELLANEOUS) IMPLANT
BIT DRILL 1.6MX128 (BIT) IMPLANT
BIT DRILL 170X2.5X (BIT) IMPLANT
BIT DRL 170X2.5X (BIT) ×1
BLADE SAG 18X100X1.27 (BLADE) ×2 IMPLANT
CLSR STERI-STRIP ANTIMIC 1/2X4 (GAUZE/BANDAGES/DRESSINGS) ×1 IMPLANT
COVER BACK TABLE 60X90IN (DRAPES) ×2 IMPLANT
COVER SURGICAL LIGHT HANDLE (MISCELLANEOUS) ×2 IMPLANT
CUP HUMERAL 42 PLUS 3 (Orthopedic Implant) ×1 IMPLANT
DRAPE INCISE IOBAN 66X45 STRL (DRAPES) ×2 IMPLANT
DRAPE ORTHO SPLIT 77X108 STRL (DRAPES) ×4
DRAPE SHEET LG 3/4 BI-LAMINATE (DRAPES) ×2 IMPLANT
DRAPE SURG ORHT 6 SPLT 77X108 (DRAPES) ×2 IMPLANT
DRAPE TOP 10253 STERILE (DRAPES) ×2 IMPLANT
DRAPE U-SHAPE 47X51 STRL (DRAPES) ×2 IMPLANT
DRILL 2.5 (BIT) ×2
DRSG ADAPTIC 3X8 NADH LF (GAUZE/BANDAGES/DRESSINGS) ×2 IMPLANT
DRSG PAD ABDOMINAL 8X10 ST (GAUZE/BANDAGES/DRESSINGS) ×2 IMPLANT
DURAPREP 26ML APPLICATOR (WOUND CARE) ×2 IMPLANT
ELECT BLADE TIP CTD 4 INCH (ELECTRODE) ×2 IMPLANT
ELECT NDL TIP 2.8 STRL (NEEDLE) ×1 IMPLANT
ELECT NEEDLE TIP 2.8 STRL (NEEDLE) ×2 IMPLANT
ELECT REM PT RETURN 15FT ADLT (MISCELLANEOUS) ×2 IMPLANT
EPIPHYSI RIGHT SZ 2 (Shoulder) ×2 IMPLANT
EPIPHYSIS RIGHT SZ 2 (Shoulder) IMPLANT
FACESHIELD WRAPAROUND (MASK) ×2 IMPLANT
FACESHIELD WRAPAROUND OR TEAM (MASK) ×1 IMPLANT
GAUZE SPONGE 4X4 12PLY STRL (GAUZE/BANDAGES/DRESSINGS) ×2 IMPLANT
GLENOSPHERE XTEND LAT 42+0 STD (Miscellaneous) ×1 IMPLANT
GLOVE SURG ORTHO LTX SZ7.5 (GLOVE) ×2 IMPLANT
GLOVE SURG ORTHO LTX SZ8.5 (GLOVE) ×2 IMPLANT
GLOVE SURG UNDER POLY LF SZ7.5 (GLOVE) ×2 IMPLANT
GLOVE SURG UNDER POLY LF SZ8.5 (GLOVE) ×2 IMPLANT
GOWN STRL REUS W/TWL XL LVL3 (GOWN DISPOSABLE) ×4 IMPLANT
KIT BASIN OR (CUSTOM PROCEDURE TRAY) ×2 IMPLANT
KIT TURNOVER KIT A (KITS) IMPLANT
MANIFOLD NEPTUNE II (INSTRUMENTS) ×2 IMPLANT
METAGLENE DELTA EXTEND (Trauma) IMPLANT
METAGLENE DXTEND (Trauma) ×2 IMPLANT
NDL MAYO CATGUT SZ4 TPR NDL (NEEDLE) IMPLANT
NEEDLE MAYO CATGUT SZ4 (NEEDLE) IMPLANT
NS IRRIG 1000ML POUR BTL (IV SOLUTION) ×2 IMPLANT
PACK SHOULDER (CUSTOM PROCEDURE TRAY) ×2 IMPLANT
PIN GUIDE 1.2 (PIN) ×1 IMPLANT
PIN GUIDE GLENOPHERE 1.5MX300M (PIN) ×1 IMPLANT
PIN METAGLENE 2.5 (PIN) ×1 IMPLANT
PROTECTOR NERVE ULNAR (MISCELLANEOUS) ×2 IMPLANT
RESTRAINT HEAD UNIVERSAL NS (MISCELLANEOUS) ×2 IMPLANT
SCREW 4.5X18MM (Screw) ×2 IMPLANT
SCREW 48L (Screw) ×1 IMPLANT
SCREW BN 18X4.5XSTRL SHLDR (Screw) IMPLANT
SCREW LOCK 42 (Screw) ×1 IMPLANT
SLING ARM FOAM STRAP LRG (SOFTGOODS) ×1 IMPLANT
SMARTMIX MINI TOWER (MISCELLANEOUS)
SPIKE FLUID TRANSFER (MISCELLANEOUS) ×2 IMPLANT
SPONGE T-LAP 18X18 ~~LOC~~+RFID (SPONGE) ×2 IMPLANT
SPONGE T-LAP 4X18 ~~LOC~~+RFID (SPONGE) ×2 IMPLANT
STEM DELTA DIA 10 HA (Stem) ×1 IMPLANT
STRIP CLOSURE SKIN 1/2X4 (GAUZE/BANDAGES/DRESSINGS) ×2 IMPLANT
SUCTION FRAZIER HANDLE 10FR (MISCELLANEOUS) ×2
SUCTION TUBE FRAZIER 10FR DISP (MISCELLANEOUS) ×1 IMPLANT
SUT FIBERWIRE #2 38 T-5 BLUE (SUTURE) ×4
SUT MNCRL AB 4-0 PS2 18 (SUTURE) ×2 IMPLANT
SUT VIC AB 0 CT1 36 (SUTURE) ×4 IMPLANT
SUT VIC AB 0 CT2 27 (SUTURE) ×2 IMPLANT
SUT VIC AB 2-0 CT1 27 (SUTURE) ×2
SUT VIC AB 2-0 CT1 TAPERPNT 27 (SUTURE) ×1 IMPLANT
SUTURE FIBERWR #2 38 T-5 BLUE (SUTURE) ×2 IMPLANT
TAPE CLOTH 4X10 WHT NS (GAUZE/BANDAGES/DRESSINGS) ×1 IMPLANT
TOWEL OR 17X26 10 PK STRL BLUE (TOWEL DISPOSABLE) ×2 IMPLANT
TOWER SMARTMIX MINI (MISCELLANEOUS) IMPLANT

## 2021-10-14 NOTE — Transfer of Care (Signed)
Immediate Anesthesia Transfer of Care Note  Patient: Daniel Griffin  Procedure(s) Performed: REVERSE SHOULDER ARTHROPLASTY (Right: Shoulder)  Patient Location: PACU  Anesthesia Type:General and Regional  Level of Consciousness: awake, alert  and oriented  Airway & Oxygen Therapy: Patient Spontanous Breathing and Patient connected to face mask  Post-op Assessment: Report given to RN and Post -op Vital signs reviewed and stable  Post vital signs: Reviewed and stable  Last Vitals:  Vitals Value Taken Time  BP 138/68 10/14/21 1209  Temp    Pulse 70 10/14/21 1211  Resp 26 10/14/21 1211  SpO2 99 % 10/14/21 1211  Vitals shown include unvalidated device data.  Last Pain:  Vitals:   10/14/21 0911  TempSrc:   PainSc: 0-No pain         Complications: No notable events documented.

## 2021-10-14 NOTE — Anesthesia Procedure Notes (Signed)
Anesthesia Regional Block: Interscalene brachial plexus block   Pre-Anesthetic Checklist: , timeout performed,  Correct Patient, Correct Site, Correct Laterality,  Correct Procedure, Correct Position, site marked,  Risks and benefits discussed,  Surgical consent,  Pre-op evaluation,  At surgeon's request and post-op pain management  Laterality: Right  Prep: chloraprep       Needles:  Injection technique: Single-shot  Needle Type: Echogenic Stimulator Needle     Needle Length: 10cm  Needle Gauge: 20     Additional Needles:   Procedures:,,,, ultrasound used (permanent image in chart),,    Narrative:  Start time: 10/14/2021 9:05 AM End time: 10/14/2021 9:09 AM Injection made incrementally with aspirations every 5 mL.  Performed by: Personally  Anesthesiologist: Lidia Collum, MD  Additional Notes: Standard monitors applied. Skin prepped. Good needle visualization with ultrasound. Injection made in 5cc increments with no resistance to injection. Patient tolerated the procedure well.

## 2021-10-14 NOTE — Progress Notes (Signed)
Assisted Dr. Bryson Ha with right, ultrasound guided, interscalene  block. Side rails up, monitors on throughout procedure. See vital signs in flow sheet. Tolerated Procedure well. Exparel also given.

## 2021-10-14 NOTE — Discharge Instructions (Signed)
Ice to the shoulder constantly.  Keep the incision covered and clean and dry for one week, then ok to get it wet in the shower. ° °Do exercise as instructed several times per day. ° °DO NOT reach behind your back or push up out of a chair with the operative arm. ° °Use a sling while you are up and around for comfort, may remove while seated.  Keep pillow propped behind the operative elbow. ° °Follow up with Dr Kylo Gavin in two weeks in the office, call [phone] for appt °

## 2021-10-14 NOTE — Interval H&P Note (Signed)
History and Physical Interval Note:  10/14/2021 9:33 AM  Daniel Griffin  has presented today for surgery, with the diagnosis of right shoulder rotator cuff tear arthropathy.  The various methods of treatment have been discussed with the patient and family. After consideration of risks, benefits and other options for treatment, the patient has consented to  Procedure(s) with comments: REVERSE SHOULDER ARTHROPLASTY (Right) - with ISB as a surgical intervention.  The patient's history has been reviewed, patient examined, no change in status, stable for surgery.  I have reviewed the patient's chart and labs.  Questions were answered to the patient's satisfaction.     Augustin Schooling

## 2021-10-14 NOTE — Op Note (Signed)
NAME: Daniel Griffin, Daniel Griffin MEDICAL RECORD NO: [id number] ACCOUNT NO: [id number] DATE OF BIRTH: [birth year] FACILITY: [facility] LOCATION: [facility] PHYSICIAN: Doran Heater. Veverly Fells, MD  Operative Report   DATE OF PROCEDURE: 10/14/2021  PREOPERATIVE DIAGNOSIS:  Right shoulder rotator cuff tear arthropathy.  POSTOPERATIVE DIAGNOSIS:  Right shoulder rotator cuff tear arthropathy.  PROCEDURE PERFORMED:  Right reverse total shoulder arthroplasty using DePuy Delta Xtend prosthesis with no subscap repair.  ATTENDING SURGEON:  Doran Heater. Veverly Fells, MD.  ASSISTANT:  Charletta Cousin [place], [place], who was scrubbed during the entire procedure, and necessary for satisfactory completion of surgery.  ANESTHESIA:  General anesthesia was used plus interscalene block.  ESTIMATED BLOOD LOSS:  150 mL.  FLUID REPLACEMENT:  1500 mL crystalloid.  INSTRUMENT COUNTS:  Correct.  COMPLICATIONS:  No complications.  ANTIBIOTICS:  Perioperative antibiotics were given.  INDICATIONS:  The patient is a 61 year old male with a history of worsening right shoulder pain secondary to rotator cuff tear arthropathy.  The patient has poor function and increasing pain despite conservative management.  The patient presents for  reverse shoulder replacement to restore fixed fulcrum mechanics to the shoulder and to eliminate pain and restore function.  Informed consent obtained.  DESCRIPTION OF PROCEDURE:  After an adequate level of anesthesia was achieved, then patient was positioned in modified beach chair position, a timeout was called, verifying correct patient, correct site.  We sterilely prepped and draped the shoulder and  arm in the usual manner.  After verifying our timeout, we entered the shoulder using a standard deltopectoral approach, starting at the coracoid process extending down to the anterior humerus.  Dissection down through subcutaneous tissues using Bovie.   We identified the cephalic vein and took that laterally with the  deltoid.  Pectoralis was taken medially.  Conjoined tendon identified and retracted medially.  The subscapularis was completely torn and retracted.  We only had a pseudocapsule that we can  release off the lesser tuberosity and also the inferior humeral neck.  We tagged it with #2 FiberWire suture to protect the axillary nerve.  We were careful during the entire case not to over retract and to protect that nerve given the lack of the  subscapularis in this patient.  We extended the shoulder delivering the humeral head out of the wound.  We did tenodese the biceps in situ with figure-of-eight suture x2.  Once we released the biceps and we had shoulder extended, the humeral head was out  of the wound.  There was devoid of cartilage.  We entered the proximal humerus with a 6 mm reamer, reaming up to a size 10.  We then placed our 10 mm T-handle resection guide and resected the head at 20 degrees of retroversion.  We removed excess  osteophytes with a rongeur.  We then subluxed the humerus posteriorly, gaining good exposure of the glenoid face.  We removed the capsule and the labrum protecting again the axillary nerve.  We removed the remaining cartilage with a Cobb elevator.  We  placed our deep retractors.  We then went ahead and placed our guide pin for a metaglene baseplate reamer and then we reamed.  We did our peripheral hand reaming, drilled our central peg hole and impacted the metaglene baseplate into position, again  inferiorly situated referencing off the 6 and 12 o'clock position on the scapula.  We had a 48 screw inferiorly, a 42 at the base of the coracoid and 18 nonlocked posteriorly.  We locked the superior and inferior screws, baseplate  support and security  was excellent, positioning was great, very inferior and inclined and downward.  We then placed a 42 standard glenosphere onto the baseplate and secured it in place with a screwdriver.  Once that was done, I did a finger sweep to make sure we  had no soft  tissue caught up in the bearing.  We then went back to the humeral side and reamed for the 2 right metaphysis.  We then trialled with 10 stem, 2 right metaphysis set on the 0 setting and impacted in 20 degrees of retroversion.  We marked our version.  We  placed a 42+3 poly on the humeral tray and reduced the shoulder.  We had a nice stable shoulder and ranged the shoulder, no impingement and no gapping with external rotation or inferior pole.  We irrigated again.  We removed the trial components from  the humerus.  We then selected the HA coated 10 stem and the 2 right metaphysis set on the 0 setting and we used available bone graft from the humeral head in impaction grafting technique and impacted the stem into place.  We had a good stem security  with a 20-degree retroverted position.  We then selected the real 42+3 poly impacted that on the humeral tray, reduced the shoulder, had nice little pops reduced and had great stability and range of motion.  We irrigated thoroughly, resected the  pseudocapsule anteriorly.  I did a finger check around the axillary nerve and it was intact and then we went ahead and repaired the deltopectoral interval with 0 Vicryl suture followed by 2-0 Vicryl for subcutaneous closure and 4-0 Monocryl for skin.   Steri-Strips applied followed by sterile dressing.  The patient tolerated surgery well.   CHR D: 10/14/2021 12:13:28 pm T: 10/14/2021 2:14:00 pm  JOB: [id number]/ [id number]

## 2021-10-14 NOTE — Anesthesia Preprocedure Evaluation (Addendum)
Anesthesia Evaluation  Patient identified by MRN, date of birth, ID band Patient awake    Reviewed: Allergy & Precautions, NPO status , Patient's Chart, lab work & pertinent test results  History of Anesthesia Complications Negative for: history of anesthetic complications  Airway Mallampati: II  TM Distance: >3 FB Neck ROM: Full    Dental  (+) Dental Advisory Given, Teeth Intact   Pulmonary neg pulmonary ROS,    Pulmonary exam normal        Cardiovascular negative cardio ROS Normal cardiovascular exam     Neuro/Psych Depression Dementia negative neurological ROS     GI/Hepatic negative GI ROS, Neg liver ROS,   Endo/Other  negative endocrine ROS  Renal/GU negative Renal ROS  negative genitourinary   Musculoskeletal  (+) Arthritis , Rheumatoid disorders,    Abdominal   Peds  Hematology negative hematology ROS (+)   Anesthesia Other Findings   Reproductive/Obstetrics                            Anesthesia Physical Anesthesia Plan  ASA: 2  Anesthesia Plan: General   Post-op Pain Management: Regional block*, Toradol IV (intra-op)* and Tylenol PO (pre-op)*   Induction: Intravenous  PONV Risk Score and Plan: 2 and Ondansetron, Dexamethasone, Treatment may vary due to age or medical condition and Midazolam  Airway Management Planned: Oral ETT  Additional Equipment: None  Intra-op Plan:   Post-operative Plan: Extubation in OR  Informed Consent: I have reviewed the patients History and Physical, chart, labs and discussed the procedure including the risks, benefits and alternatives for the proposed anesthesia with the patient or authorized representative who has indicated his/her understanding and acceptance.     Dental advisory given  Plan Discussed with:   Anesthesia Plan Comments:         Anesthesia Quick Evaluation

## 2021-10-14 NOTE — Anesthesia Postprocedure Evaluation (Signed)
Anesthesia Post Note  Patient: Daniel Griffin  Procedure(s) Performed: REVERSE SHOULDER ARTHROPLASTY (Right: Shoulder)     Patient location during evaluation: PACU Anesthesia Type: General Level of consciousness: awake and alert Pain management: pain level controlled Vital Signs Assessment: post-procedure vital signs reviewed and stable Respiratory status: spontaneous breathing, nonlabored ventilation and respiratory function stable Cardiovascular status: blood pressure returned to baseline and stable Postop Assessment: no apparent nausea or vomiting Anesthetic complications: no   No notable events documented.  Last Vitals:  Vitals:   10/14/21 1315 10/14/21 1330  BP: 119/75 116/66  Pulse: (!) 57 (!) 54  Resp:    Temp:    SpO2: 95% 96%    Last Pain:  Vitals:   10/14/21 1330  TempSrc:   PainSc: 0-No pain                 Lidia Collum

## 2021-10-14 NOTE — Anesthesia Procedure Notes (Signed)
Procedure Name: Intubation Date/Time: 10/14/2021 10:27 AM Performed by: Rosaland Lao, CRNA Pre-anesthesia Checklist: Patient identified, Emergency Drugs available, Suction available and Patient being monitored Patient Re-evaluated:Patient Re-evaluated prior to induction Oxygen Delivery Method: Circle system utilized Preoxygenation: Pre-oxygenation with 100% oxygen Induction Type: IV induction Ventilation: Mask ventilation without difficulty Laryngoscope Size: Mac and 4 Grade View: Grade I Tube type: Oral Tube size: 7.0 mm Number of attempts: 1 Airway Equipment and Method: Stylet Placement Confirmation: ETT inserted through vocal cords under direct vision, positive ETCO2 and breath sounds checked- equal and bilateral Secured at: 23 cm Tube secured with: Tape Dental Injury: Teeth and Oropharynx as per pre-operative assessment

## 2021-10-14 NOTE — Evaluation (Signed)
Occupational Therapy Evaluation Patient Details Name: Daniel Griffin MRN: [id number] DOB: Jan 18, 1961 Today's Date: 10/14/2021   History of Present Illness Patient is a 61 year old man s/p right rTSA   Clinical Impression   Mr. Arsen Mangione is a 61 year old manle s/p shoulder replacement without functional use of right dominant upper extremity secondary to effects of surgery and interscalene block and shoulder precautions. Therapist provided education and instruction to patient in regards to exercises, precautions, positioning, donning upper extremity clothing and bathing while maintaining shoulder precautions, ice and edema management and donning/doffing sling. Patient verbalized understanding and demonstrated as needed. Patient needed assistance to donn shirt, underwear and pants, and provided with instruction on compensatory strategies to perform ADLs. Patient familiar with instruction from prior shoulder surgery. Patient to follow up with MD for further therapy needs.        Recommendations for follow up therapy are one component of a multi-disciplinary discharge planning process, led by the attending physician.  Recommendations may be updated based on patient status, additional functional criteria and insurance authorization.   Follow Up Recommendations  Follow physician's recommendations for discharge plan and follow up therapies    Assistance Recommended at Discharge PRN  Patient can return home with the following A little help with bathing/dressing/bathroom;Assistance with cooking/housework    Functional Status Assessment  Patient has had a recent decline in their functional status and demonstrates the ability to make significant improvements in function in a reasonable and predictable amount of time.  Equipment Recommendations       Recommendations for Other Services       Precautions / Restrictions Precautions Precautions: Shoulder Type of Shoulder Precautions: No AROM,  No PROM Shoulder Interventions: At all times;Shoulder sling/immobilizer Precaution Booklet Issued:  (handouts) Required Braces or Orthoses: Sling Restrictions Weight Bearing Restrictions: Yes RUE Weight Bearing: Non weight bearing      Mobility Bed Mobility                    Transfers                          Balance                                           ADL either performed or assessed with clinical judgement   ADL                                               Vision Baseline Vision/History: 1 Wears glasses       Perception     Praxis      Pertinent Vitals/Pain Pain Assessment Pain Assessment: No/denies pain     Hand Dominance     Extremity/Trunk Assessment Upper Extremity Assessment Upper Extremity Assessment: RUE deficits/detail RUE Deficits / Details: Impaired AROM secondary to interscalene block           Communication     Cognition Arousal/Alertness: Awake/alert Behavior During Therapy: WFL for tasks assessed/performed Overall Cognitive Status: Within Functional Limits for tasks assessed  General Comments       Exercises     Shoulder Instructions Shoulder Instructions Donning/doffing shirt without moving shoulder: Patient able to independently direct caregiver Method for sponge bathing under operated UE: Independent Donning/doffing sling/immobilizer: Independent Correct positioning of sling/immobilizer: Independent ROM for elbow, wrist and digits of operated UE: Independent Sling wearing schedule (on at all times/off for ADL's): Independent Proper positioning of operated UE when showering: Independent Dressing change: Independent Positioning of UE while sleeping: [place] expects to be discharged to:: Private residence Living Arrangements: Children Available Help at Discharge:  Family;Available PRN/intermittently                                    Prior Functioning/Environment                          OT Problem List: Decreased strength;Decreased range of motion;Impaired UE functional use;Pain      OT Treatment/Interventions:      OT Goals(Current goals can be found in the care plan section) Acute Rehab OT Goals OT Goal Formulation: All assessment and education complete, DC therapy  OT Frequency:      Co-evaluation              AM-PAC OT "6 Clicks" Daily Activity     Outcome Measure Help from another person eating meals?: A Little Help from another person taking care of personal grooming?: A Little Help from another person toileting, which includes using toliet, bedpan, or urinal?: A Little Help from another person bathing (including washing, rinsing, drying)?: A Little Help from another person to put on and taking off regular upper body clothing?: A Lot Help from another person to put on and taking off regular lower body clothing?: A Little 6 Click Score: 17   End of Session Nurse Communication:  (OT education complete)  Activity Tolerance: Patient tolerated treatment well Patient left: in chair  OT Visit Diagnosis: Pain;Muscle weakness (generalized) (M62.81)                Time: 6629-4765 OT Time Calculation (min): 14 min Charges:  OT General Charges $OT Visit: 1 Visit OT Evaluation $OT Eval Low Complexity: 1 Low  Donald Memoli, OTR/L [place]  Office [phone] Pager: [phone]   Lenward Chancellor 10/14/2021, 2:51 PM

## 2021-10-14 NOTE — Brief Op Note (Addendum)
10/14/2021  12:07 PM  PATIENT:  Daniel Griffin  61 y.o. male  PRE-OPERATIVE DIAGNOSIS:  right shoulder rotator cuff tear arthropathy  POST-OPERATIVE DIAGNOSIS:  right shoulder rotator cuff tear arthropathy  PROCEDURE:  Procedure(s) with comments: REVERSE SHOULDER ARTHROPLASTY (Right) - with ISB DePuy Delta Xtend with No Subscap repair  SURGEON:  Surgeon(s) and Role:    Netta Cedars, MD - Primary  PHYSICIAN ASSISTANT:   ASSISTANTS: [place] Bruns, PA-C   ANESTHESIA:   regional and general  EBL:  150 mL   BLOOD ADMINISTERED:none  DRAINS: none   LOCAL MEDICATIONS USED:  MARCAINE     SPECIMEN:  No Specimen  DISPOSITION OF SPECIMEN:  N/A  COUNTS:  YES  TOURNIQUET:  * No tourniquets in log *  DICTATION: .Other Dictation: Dictation Number [id number]  PLAN OF CARE: Discharge to home after PACU  PATIENT DISPOSITION:  PACU - hemodynamically stable.   Delay start of Pharmacological VTE agent (>24hrs) due to surgical blood loss or risk of bleeding: not applicable

## 2021-10-16 ENCOUNTER — Encounter (HOSPITAL_COMMUNITY): Payer: Self-pay | Admitting: Orthopedic Surgery

## 2021-10-18 DIAGNOSIS — M13861 Other specified arthritis, right knee: Secondary | ICD-10-CM | POA: Diagnosis not present

## 2021-10-18 DIAGNOSIS — M170 Bilateral primary osteoarthritis of knee: Secondary | ICD-10-CM | POA: Diagnosis not present

## 2021-10-18 DIAGNOSIS — M13862 Other specified arthritis, left knee: Secondary | ICD-10-CM | POA: Diagnosis not present
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
## 2021-10-27 ENCOUNTER — Ambulatory Visit (INDEPENDENT_AMBULATORY_CARE_PROVIDER_SITE_OTHER): Payer: BC Managed Care – PPO | Admitting: Adult Health

## 2021-10-27 ENCOUNTER — Encounter: Payer: Self-pay | Admitting: Adult Health

## 2021-10-27 VITALS — BP 140/79 | HR 61 | Ht 67.0 in | Wt 205.0 lb

## 2021-10-27 DIAGNOSIS — G3184 Mild cognitive impairment, so stated: Secondary | ICD-10-CM | POA: Diagnosis not present

## 2021-10-27 DIAGNOSIS — I671 Cerebral aneurysm, nonruptured: Secondary | ICD-10-CM | POA: Diagnosis not present

## 2021-10-27 NOTE — Patient Instructions (Signed)
No changes today - we will plan on a follow up visit in 1 year ? ? ? ? ? ? ? ? ?Thank you for coming to see [place] at [facility]. I hope we have been able to provide you high quality care today. ? ?You may receive a patient satisfaction survey over the next few weeks. We would appreciate your feedback and comments so that we may continue to improve ourselves and the health of our patients. ? ?

## 2021-11-01 ENCOUNTER — Other Ambulatory Visit (HOSPITAL_COMMUNITY): Payer: Self-pay

## 2021-11-03 ENCOUNTER — Other Ambulatory Visit (HOSPITAL_COMMUNITY): Payer: Self-pay

## 2021-11-03 ENCOUNTER — Other Ambulatory Visit (HOSPITAL_COMMUNITY): Payer: BC Managed Care – PPO

## 2021-11-07 ENCOUNTER — Other Ambulatory Visit (HOSPITAL_COMMUNITY): Payer: Self-pay

## 2021-11-08 DIAGNOSIS — Z4789 Encounter for other orthopedic aftercare: Secondary | ICD-10-CM | POA: Diagnosis not present

## 2021-11-16 ENCOUNTER — Telehealth: Payer: Self-pay

## 2021-11-16 ENCOUNTER — Other Ambulatory Visit (HOSPITAL_COMMUNITY): Payer: Self-pay

## 2021-11-16 NOTE — Telephone Encounter (Signed)
Patient called requesting [facility] pharmacy phone number to order refill of Orencia.   ?

## 2021-12-02 ENCOUNTER — Other Ambulatory Visit (HOSPITAL_COMMUNITY): Payer: Self-pay | Admitting: Neuroradiology

## 2021-12-02 ENCOUNTER — Telehealth (HOSPITAL_COMMUNITY): Payer: Self-pay | Admitting: Radiology

## 2021-12-02 DIAGNOSIS — I671 Cerebral aneurysm, nonruptured: Secondary | ICD-10-CM

## 2021-12-02 NOTE — Telephone Encounter (Signed)
Called pt's pharmacy, left VM on provider line that per Dr. Pedro Earls, pt needs a prescription for Brilinta '90mg'$  1 BID x60 with 3 refills. He is to begin taking on 12/07/21. JM ?

## 2021-12-07 DIAGNOSIS — Z4789 Encounter for other orthopedic aftercare: Secondary | ICD-10-CM | POA: Diagnosis not present

## 2021-12-09 ENCOUNTER — Other Ambulatory Visit (HOSPITAL_COMMUNITY): Payer: Self-pay

## 2021-12-12 ENCOUNTER — Other Ambulatory Visit (HOSPITAL_COMMUNITY): Payer: Self-pay

## 2021-12-13 ENCOUNTER — Other Ambulatory Visit: Payer: Self-pay | Admitting: Student

## 2021-12-13 ENCOUNTER — Other Ambulatory Visit: Payer: Self-pay

## 2021-12-13 ENCOUNTER — Encounter (HOSPITAL_COMMUNITY): Payer: Self-pay | Admitting: *Deleted

## 2021-12-13 ENCOUNTER — Other Ambulatory Visit (HOSPITAL_COMMUNITY): Payer: Self-pay | Admitting: Physician Assistant

## 2021-12-13 NOTE — Anesthesia Preprocedure Evaluation (Addendum)
Anesthesia Evaluation  ?Patient identified by MRN, date of birth, ID band ?Patient awake ? ? ? ?Reviewed: ?Allergy & Precautions, H&P , NPO status , Patient's Chart, lab work & pertinent test results ? ?Airway ?Mallampati: II ? ?TM Distance: >3 FB ?Neck ROM: Full ? ? ? Dental ?no notable dental hx. ?(+) Teeth Intact, Dental Advisory Given ?  ?Pulmonary ?neg pulmonary ROS,  ?  ?Pulmonary exam normal ?breath sounds clear to auscultation ? ? ? ? ? ? Cardiovascular ?Exercise Tolerance: Good ?+ Peripheral Vascular Disease  ?negative cardio ROS ? ? ?Rhythm:Regular Rate:Normal ? ? ?  ?Neuro/Psych ?Seizures -, Well Controlled,  Depression Dementia   ? GI/Hepatic ?negative GI ROS, Neg liver ROS,   ?Endo/Other  ?negative endocrine ROS ? Renal/GU ?negative Renal ROS  ?negative genitourinary ?  ?Musculoskeletal ? ?(+) Arthritis , Osteoarthritis,   ? Abdominal ?  ?Peds ? Hematology ?negative hematology ROS ?(+)   ?Anesthesia Other Findings ? ? Reproductive/Obstetrics ?negative OB ROS ? ?  ? ? ? ? ? ? ? ? ? ? ? ? ? ?  ?  ? ? ? ? ? ? ? ?Anesthesia Physical ?Anesthesia Plan ? ?ASA: 3 ? ?Anesthesia Plan: General  ? ?Post-op Pain Management: Tylenol PO (pre-op)*  ? ?Induction: Intravenous ? ?PONV Risk Score and Plan: 3 and Ondansetron, Dexamethasone and Treatment may vary due to age or medical condition ? ?Airway Management Planned: Oral ETT ? ?Additional Equipment: Arterial line ? ?Intra-op Plan:  ? ?Post-operative Plan: Extubation in OR ? ?Informed Consent: I have reviewed the patients History and Physical, chart, labs and discussed the procedure including the risks, benefits and alternatives for the proposed anesthesia with the patient or authorized representative who has indicated his/her understanding and acceptance.  ? ? ? ?Dental advisory given ? ?Plan Discussed with: CRNA ? ?Anesthesia Plan Comments:   ? ? ? ? ? ?Anesthesia Quick Evaluation ? ?

## 2021-12-13 NOTE — H&P (Deleted)
  The note originally documented on this encounter has been moved the the encounter in which it belongs.  

## 2021-12-13 NOTE — Progress Notes (Signed)
I Spoke to Lupe Carney, Daniel Griffin's sister and designated party release. Ms Brent General reports that patient does not complain of chest pain or shortness of breath. Ms Brent General  denies having any s/s of Covid in pt's household.  Patient denies any known exposure to Covid.  ? ?PCP is Stanton Kidney - Harmon Pier FNP at [facility]. ? ?I instructed  Ms Brent General to have Daniel. Tener to    shower with antibacteria soap.  DO not shave. No nail polish, artificial or acrylic nails. Wear clean clothes, brush your teeth. ?Glasses, contact lens,dentures or partials may not be worn in the OR. If you need to wear them, please bring a case for glasses, do not wear contacts or bring a case, the hospital does not have contact cases, dentures or partials will have to be removed , make sure they are clean, we will provide a denture cup to put them in. You will need some one to drive you home and a responsible person over the age of 6 to stay with you for the first 24 hours after surgery.  ?

## 2021-12-13 NOTE — H&P (Signed)
? ?Chief Complaint: ?Patient was seen in consultation today for R MCA bifurcation aneurysm ? ?Referring Physician(s): ?de [place]o [place] ? ?Supervising Physician: Pedro Earls ? ?Patient Status: Daniel Griffin - Out-pt ? ?History of Present Illness: ?Daniel Griffin is a 61 y.o. male dementia, rheumatoid arthritis, remote history of alcohol abuse, and right MCA bifurcation aneurysm. Patient recently underwent cerebral angiogram 09/01/21 which showed 58mm bifucation aneurysm in the cervical right ICA with luminal irregularity and associated pseudoaneurysm. He subsequently met with Dr. Karenann Cai 09/26/21 to discuss these results.  After discussion patient and sister elected to proceed with intervention. In the interim, [name] underwent right rotator cuff tear repair with Ortho and his itnervention was delayed to accomodate procedure and recovery.  He has recovered well and is stable to proceed with cerebral intervention.  Patient initiated Brilinta 90 mg BID as instructed. He is also taking aspirin $RemoveBefore'81mg'zoGKpsjIxRiwi$  daily.  ? ?[name] to [facility] today in his usual state of health.  He denies changes to his medical history.  No new concerns or complaints.  He denies dizziness, headache, weakness, trouble walking, trouble speaking or eating.  He lives at home alone with family support.  He is agreeable to intervention today.  ? ?Past Medical History:  ?Diagnosis Date  ? Arthritis   ? RA  ? Depression   ? Seizures ([place]) 06/21/2020  ? ETOH w/d related  ? ? ?Past Surgical History:  ?Procedure Laterality Date  ? ANKLE FRACTURE SURGERY    ? Left  ? APPENDECTOMY    ? IR 3D INDEPENDENT [facility]KST  09/01/2021  ? IR ANGIO INTRA EXTRACRAN SEL INTERNAL CAROTID BILAT MOD SED  09/01/2021  ? IR ANGIO VERTEBRAL SEL VERTEBRAL BILAT MOD SED  09/01/2021  ? IR US GUIDE VASC ACCESS RIGHT  09/01/2021  ? REVERSE SHOULDER ARTHROPLASTY Left 05/13/2021  ? Procedure: REVERSE SHOULDER ARTHROPLASTY;  Surgeon: Netta Cedars, MD;  Location: [facility];  Service: Orthopedics;  Laterality: Left;  with ISB  ? REVERSE SHOULDER ARTHROPLASTY Right 10/14/2021  ? Procedure: REVERSE SHOULDER ARTHROPLASTY;  Surgeon: Netta Cedars, MD;  Location: [facility];  Service: Orthopedics;  Laterality: Right;  with ISB  ? ? ?Allergies: ?Patient has no known allergies. ? ?Medications: ?Prior to Admission medications   ?Medication Sig Start Date End Date Taking? Authorizing Provider  ?Abatacept (ORENCIA CLICKJECT) 833 MG/ML SOAJ INJECT 125 MG INTO THE SKIN ONCE A WEEK. 09/12/21   Ofilia Neas, PA-C  ?aspirin EC 81 MG tablet Take 81 mg by mouth daily. Swallow whole.    [provider]  ?BRILINTA 90 MG TABS tablet Take 90 mg by mouth 2 (two) times daily. 12/02/21   [provider]  ?buPROPion (WELLBUTRIN XL) 150 MG 24 hr tablet Take 1 tablet (150 mg total) by mouth daily. ?Patient taking differently: Take 150 mg by mouth at bedtime. 04/21/21   Frann Rider, NP  ?escitalopram (LEXAPRO) 10 MG tablet Take 1 tablet (10 mg total) by mouth daily. ?Patient taking differently: Take 10 mg by mouth at bedtime. 10/10/21   Chevis Pretty, FNP  ?methocarbamol (ROBAXIN) 500 MG tablet Take 1 tablet (500 mg total) by mouth every 6 (six) hours as needed for muscle spasms. ?Patient not taking: Reported on 12/08/2021 10/14/21   Netta Cedars, MD  ?ondansetron (ZOFRAN) 4 MG tablet Take 1 tablet (4 mg total) by mouth every 8 (eight) hours as needed for nausea, vomiting or refractory nausea / vomiting. ?Patient not taking: Reported on 12/08/2021 10/14/21  10/14/22  Netta Cedars, MD  ?oxyCODONE-acetaminophen (PERCOCET) 5-325 MG tablet Take 1 tablet by mouth every 4 (four) hours as needed for severe pain. ?Patient not taking: Reported on 12/08/2021 10/14/21 10/14/22  Netta Cedars, MD  ?  ? ?Family History  ?Problem Relation Age of Onset  ? Alzheimer's disease Father   ? Healthy Son   ? Healthy Daughter   ? Rheum arthritis Daughter   ? Healthy Daughter   ? ? ?Social  History  ? ?Socioeconomic History  ? Marital status: Single  ?  Spouse name: Not on file  ? Number of children: Not on file  ? Years of education: Not on file  ? Highest education level: Not on file  ?Occupational History  ? Occupation: not worked since seizure 06/21/20  ?Tobacco Use  ? Smoking status: Never  ? Smokeless tobacco: Current  ?  Types: Snuff, Chew  ?Vaping Use  ? Vaping Use: Never used  ?Substance and Sexual Activity  ? Alcohol use: Not Currently  ?  Comment: Quit in 06/2020  ? Drug use: Yes  ?  Frequency: 7.0 times per week  ?  Types: Marijuana  ?  Comment: occ  ? Sexual activity: Not on file  ?Other Topics Concern  ? Not on file  ?Social History Narrative  ? Lives alone  ? Right Handed  ? Rarely drinks caffeine  ?    ? ?Social Determinants of Health  ? ?Financial Resource Strain: Not on file  ?Food Insecurity: Not on file  ?Transportation Needs: Not on file  ?Physical Activity: Not on file  ?Stress: Not on file  ?Social Connections: Not on file  ? ? ? ?Review of Systems: A 12 point ROS discussed and pertinent positives are indicated in the HPI above.  All other systems are negative. ? ?Review of Systems  ?Constitutional:  Negative for fatigue and fever.  ?Respiratory:  Negative for cough and shortness of breath.   ?Gastrointestinal:  Negative for abdominal pain.  ?Musculoskeletal:  Negative for back pain.  ?Psychiatric/Behavioral:  Negative for behavioral problems and confusion.   ? ?Vital Signs: ?There were no vitals taken for this visit. ? ?Physical Exam ?Vitals and nursing note reviewed.  ?Constitutional:   ?   General: He is not in acute distress. ?   Appearance: Normal appearance. He is not ill-appearing.  ?HENT:  ?   Mouth/Throat:  ?   Mouth: Mucous membranes are moist.  ?   Pharynx: Oropharynx is clear.  ?Cardiovascular:  ?   Rate and Rhythm: Normal rate and regular rhythm.  ?Pulmonary:  ?   Effort: Pulmonary effort is normal.  ?   Breath sounds: Normal breath sounds.  ?Abdominal:  ?   General:  Abdomen is flat.  ?   Palpations: Abdomen is soft.  ?Skin: ?   General: Skin is warm and dry.  ?Neurological:  ?   General: No focal deficit present.  ?   Mental Status: He is alert and oriented to person, place, and time. Mental status is at baseline.  ?Psychiatric:     ?   Mood and Affect: Mood normal.     ?   Behavior: Behavior normal.     ?   Thought Content: Thought content normal.     ?   Judgment: Judgment normal.  ? ? ? ?MD Evaluation ?Airway: WNL ?Heart: WNL ?Abdomen: WNL ?Chest/ Lungs: WNL ?ASA  Classification: 3 ?Mallampati/Airway Score: Two ? ? ?Imaging: ?No results found. ? ?Labs: ? ?CBC: ?Recent  Labs  ?  07/18/21 ?0900 09/01/21 ?0639 10/05/21 ?4997 12/14/21 ?0709  ?WBC 7.3 9.2 8.6 7.6  ?HGB 13.7 13.0 13.3 13.3  ?HCT 41.3 39.4 39.6 39.7  ?PLT 231 196 222 251  ? ? ?COAGS: ?Recent Labs  ?  09/01/21 ?0639 12/14/21 ?0709  ?INR 0.8 0.9  ? ? ?BMP: ?Recent Labs  ?  02/25/21 ?1820 05/06/21 ?1033 05/14/21 ?0324 07/18/21 ?0900 09/01/21 ?9906 12/14/21 ?0709  ?NA 140 141 137 142 140 138  ?K 4.7 4.4 3.9 4.3 3.9 4.1  ?CL 106 106 104 105 106 107  ?CO2 $Rem'29 30 26 30 25 24  'XKEI$ ?GLUCOSE 92 94 166* 97 107* 105*  ?BUN $Rem'18 13 12 15 17 18  'DjqK$ ?CALCIUM 8.9 9.4 8.4* 9.4 8.7* 8.9  ?CREATININE 0.81 0.70 0.79 0.89 0.65 0.77  ?GFRNONAA 97 >60 >60  --  >60 >60  ?GFRAA 112  --   --   --   --   --   ? ? ?LIVER FUNCTION TESTS: ?Recent Labs  ?  02/15/21 ?1528 02/25/21 ?8934 05/06/21 ?1033 07/18/21 ?0900 09/12/21 ?1021  ?BILITOT 0.6 0.8 0.9 0.7 0.6  ?AST $Rem'17 12 16 15 12  'HCwz$ ?ALT $Re'19 17 18 16 14  'pDR$ ?ALKPHOS 78  --  62  --   --   ?PROT 7.4 6.0* 7.0 6.6 6.4  ?ALBUMIN 4.5  --  4.2  --   --   ? ? ?TUMOR MARKERS: ?No results for input(s): AFPTM, CEA, CA199, CHROMGRNA in the last 8760 hours. ? ?Assessment and Plan: ?Patient with past medical history of dementia, seizures, PVD,  3 mm R MCA bifurcation aneurysm presents for intervention to treat his aneurysm.  ? ?Case reviewed by Dr. Karenann Cai  who approves patient for procedure.  ?Patient presents  today in their usual state of health.  ?He has been NPO and is not currently on blood thinners. He has been taking Brilinta $RemoveBeforeD'90mg'FDbmthSdGjfLgf$  BID, aspirin 81 mg daily and last took this AM. ?His daughter is available for sup

## 2021-12-14 ENCOUNTER — Inpatient Hospital Stay (HOSPITAL_COMMUNITY)
Admission: AD | Admit: 2021-12-14 | Discharge: 2021-12-15 | DRG: 025 | Disposition: A | Discharge status: Home / Self Care | Payer: BC Managed Care – PPO | Attending: Neuroradiology | Admitting: Neuroradiology

## 2021-12-14 ENCOUNTER — Encounter (HOSPITAL_COMMUNITY): Payer: Self-pay

## 2021-12-14 ENCOUNTER — Ambulatory Visit (HOSPITAL_COMMUNITY): Payer: BC Managed Care – PPO | Admitting: Anesthesiology

## 2021-12-14 ENCOUNTER — Other Ambulatory Visit (HOSPITAL_COMMUNITY): Payer: Self-pay

## 2021-12-14 ENCOUNTER — Inpatient Hospital Stay (HOSPITAL_COMMUNITY)
Admission: RE | Admit: 2021-12-14 | Discharge: 2021-12-14 | Disposition: A | Discharge status: Home / Self Care | Payer: BC Managed Care – PPO | Source: Ambulatory Visit | Attending: Neuroradiology | Admitting: Neuroradiology

## 2021-12-14 ENCOUNTER — Encounter (HOSPITAL_COMMUNITY)
Admission: AD | Disposition: A | Discharge status: Home / Self Care | Payer: Self-pay | Source: Home / Self Care | Attending: Neuroradiology

## 2021-12-14 DIAGNOSIS — Z96612 Presence of left artificial shoulder joint: Secondary | ICD-10-CM | POA: Diagnosis not present

## 2021-12-14 DIAGNOSIS — Z820 Family history of epilepsy and other diseases of the nervous system: Secondary | ICD-10-CM

## 2021-12-14 DIAGNOSIS — Z79899 Other long term (current) drug therapy: Secondary | ICD-10-CM

## 2021-12-14 DIAGNOSIS — M62838 Other muscle spasm: Secondary | ICD-10-CM | POA: Diagnosis present

## 2021-12-14 DIAGNOSIS — M1990 Unspecified osteoarthritis, unspecified site: Secondary | ICD-10-CM | POA: Diagnosis present

## 2021-12-14 DIAGNOSIS — I671 Cerebral aneurysm, nonruptured: Secondary | ICD-10-CM | POA: Diagnosis not present

## 2021-12-14 DIAGNOSIS — I6521 Occlusion and stenosis of right carotid artery: Secondary | ICD-10-CM | POA: Diagnosis present

## 2021-12-14 DIAGNOSIS — I739 Peripheral vascular disease, unspecified: Secondary | ICD-10-CM | POA: Diagnosis present

## 2021-12-14 DIAGNOSIS — F1011 Alcohol abuse, in remission: Secondary | ICD-10-CM | POA: Diagnosis present

## 2021-12-14 DIAGNOSIS — Z7902 Long term (current) use of antithrombotics/antiplatelets: Secondary | ICD-10-CM

## 2021-12-14 DIAGNOSIS — F0390 Unspecified dementia without behavioral disturbance: Secondary | ICD-10-CM | POA: Diagnosis not present

## 2021-12-14 DIAGNOSIS — I7771 Dissection of carotid artery: Secondary | ICD-10-CM | POA: Diagnosis not present

## 2021-12-14 DIAGNOSIS — R569 Unspecified convulsions: Secondary | ICD-10-CM | POA: Diagnosis not present

## 2021-12-14 DIAGNOSIS — F0394 Unspecified dementia, unspecified severity, with anxiety: Secondary | ICD-10-CM | POA: Diagnosis present

## 2021-12-14 DIAGNOSIS — M069 Rheumatoid arthritis, unspecified: Secondary | ICD-10-CM | POA: Diagnosis present

## 2021-12-14 DIAGNOSIS — Z7982 Long term (current) use of aspirin: Secondary | ICD-10-CM

## 2021-12-14 DIAGNOSIS — F32A Depression, unspecified: Secondary | ICD-10-CM | POA: Diagnosis present

## 2021-12-14 HISTORY — PX: IR TRANSCATH/EMBOLIZ: IMG695

## 2021-12-14 HISTORY — PX: IR ANGIO INTRA EXTRACRAN SEL INTERNAL CAROTID UNI R MOD SED: IMG5362

## 2021-12-14 HISTORY — PX: IR US GUIDE VASC ACCESS RIGHT: IMG2390

## 2021-12-14 HISTORY — PX: IR CT HEAD LTD: IMG2386

## 2021-12-14 HISTORY — PX: IR ANGIOGRAM FOLLOW UP STUDY: IMG697

## 2021-12-14 HISTORY — PX: RADIOLOGY WITH ANESTHESIA: SHX6223

## 2021-12-14 HISTORY — PX: IR 3D INDEPENDENT WKST: IMG2385

## 2021-12-14 LAB — CBC WITH DIFFERENTIAL/PLATELET
Abs Immature Granulocytes: 0.05 10*3/uL (ref 0.00–0.07)
Basophils Absolute: 0.1 10*3/uL (ref 0.0–0.1)
Basophils Relative: 1 %
Eosinophils Absolute: 0.2 10*3/uL (ref 0.0–0.5)
Eosinophils Relative: 2 %
HCT: 39.7 % (ref 39.0–52.0)
Hemoglobin: 13.3 g/dL (ref 13.0–17.0)
Immature Granulocytes: 1 %
Lymphocytes Relative: 18 %
Lymphs Abs: 1.4 10*3/uL (ref 0.7–4.0)
MCH: 31 pg (ref 26.0–34.0)
MCHC: 33.5 g/dL (ref 30.0–36.0)
MCV: 92.5 fL (ref 80.0–100.0)
Monocytes Absolute: 0.8 10*3/uL (ref 0.1–1.0)
Monocytes Relative: 11 %
Neutro Abs: 5.1 10*3/uL (ref 1.7–7.7)
Neutrophils Relative %: 67 %
Platelets: 251 10*3/uL (ref 150–400)
RBC: 4.29 MIL/uL (ref 4.22–5.81)
RDW: 12.2 % (ref 11.5–15.5)
WBC: 7.6 10*3/uL (ref 4.0–10.5)
nRBC: 0 % (ref 0.0–0.2)

## 2021-12-14 LAB — PROTIME-INR
INR: 0.9 (ref 0.8–1.2)
Prothrombin Time: 12.4 seconds (ref 11.4–15.2)

## 2021-12-14 LAB — BASIC METABOLIC PANEL
Anion gap: 7 (ref 5–15)
BUN: 18 mg/dL (ref 8–23)
CO2: 24 mmol/L (ref 22–32)
Calcium: 8.9 mg/dL (ref 8.9–10.3)
Chloride: 107 mmol/L (ref 98–111)
Creatinine, Ser: 0.77 mg/dL (ref 0.61–1.24)
GFR, Estimated: >60 mL/min (ref >60)
Glucose, Bld: 105 mg/dL — ABNORMAL HIGH (ref 70–99)
Potassium: 4.1 mmol/L (ref 3.5–5.1)
Sodium: 138 mmol/L (ref 135–145)

## 2021-12-14 LAB — POCT ACTIVATED CLOTTING TIME
Activated Clotting Time: 137 seconds
Activated Clotting Time: 203 seconds

## 2021-12-14 LAB — MRSA NEXT GEN BY PCR, NASAL: MRSA by PCR Next Gen: NOT DETECTED

## 2021-12-14 SURGERY — IR WITH ANESTHESIA
Anesthesia: General

## 2021-12-14 MED ORDER — ROCURONIUM BROMIDE 10 MG/ML (PF) SYRINGE
PREFILLED_SYRINGE | INTRAVENOUS | Status: DC | PRN
  Administered 2021-12-14: 20 mg via INTRAVENOUS
  Administered 2021-12-14: 60 mg via INTRAVENOUS

## 2021-12-14 MED ORDER — ONDANSETRON HCL 4 MG/2ML IJ SOLN
INTRAMUSCULAR | Status: DC | PRN
  Administered 2021-12-14: 4 mg via INTRAVENOUS

## 2021-12-14 MED ORDER — ASPIRIN 81 MG PO CHEW: 81.0000 mg | CHEWABLE_TABLET | Freq: Every day | ORAL | Status: DC

## 2021-12-14 MED ORDER — CLEVIDIPINE BUTYRATE 0.5 MG/ML IV EMUL
INTRAVENOUS | Status: AC
  Filled 2021-12-14: qty 50

## 2021-12-14 MED ORDER — SUGAMMADEX SODIUM 200 MG/2ML IV SOLN
INTRAVENOUS | Status: DC | PRN
Start: 2021-12-14 — End: 2021-12-14
  Administered 2021-12-14: 200 mg via INTRAVENOUS

## 2021-12-14 MED ORDER — ACETAMINOPHEN 500 MG PO TABS
1000.0000 mg | ORAL_TABLET | Freq: Once | ORAL | Status: AC
  Administered 2021-12-14: 1000 mg via ORAL
  Filled 2021-12-14: qty 2

## 2021-12-14 MED ORDER — SODIUM CHLORIDE 0.9 % IV SOLN: INTRAVENOUS | Status: DC

## 2021-12-14 MED ORDER — VERAPAMIL HCL 2.5 MG/ML IV SOLN
INTRAVENOUS | Status: AC
  Filled 2021-12-14: qty 2

## 2021-12-14 MED ORDER — NIMODIPINE 30 MG PO CAPS
60.0000 mg | ORAL_CAPSULE | ORAL | Status: AC
  Administered 2021-12-14: 60 mg via ORAL
  Filled 2021-12-14: qty 2

## 2021-12-14 MED ORDER — PHENYLEPHRINE 80 MCG/ML (10ML) SYRINGE FOR IV PUSH (FOR BLOOD PRESSURE SUPPORT)
PREFILLED_SYRINGE | INTRAVENOUS | Status: DC | PRN
  Administered 2021-12-14: 80 ug via INTRAVENOUS

## 2021-12-14 MED ORDER — IOHEXOL 300 MG/ML  SOLN
100.0000 mL | Freq: Once | INTRAMUSCULAR | Status: AC | PRN
  Administered 2021-12-14: 55 mL via INTRA_ARTERIAL

## 2021-12-14 MED ORDER — NITROGLYCERIN 1 MG/10 ML FOR IR/CATH LAB
INTRA_ARTERIAL | Status: AC | PRN
  Administered 2021-12-14: 200 ug via INTRA_ARTERIAL

## 2021-12-14 MED ORDER — PROPOFOL 10 MG/ML IV BOLUS
INTRAVENOUS | Status: DC | PRN
  Administered 2021-12-14: 50 mg via INTRAVENOUS
  Administered 2021-12-14: 100 mg via INTRAVENOUS
  Administered 2021-12-14: 50 mg via INTRAVENOUS

## 2021-12-14 MED ORDER — HEPARIN SODIUM (PORCINE) 1000 UNIT/ML IJ SOLN
INTRAMUSCULAR | Status: AC | PRN
  Administered 2021-12-14: 5000 [IU] via INTRAVENOUS
  Administered 2021-12-14: 1000 [IU] via INTRAVENOUS

## 2021-12-14 MED ORDER — NITROGLYCERIN 1 MG/10 ML FOR IR/CATH LAB
INTRA_ARTERIAL | Status: AC
Start: 2021-12-14 — End: 2021-12-14
  Filled 2021-12-14: qty 10

## 2021-12-14 MED ORDER — FENTANYL CITRATE (PF) 100 MCG/2ML IJ SOLN: 25.0000 ug | INTRAMUSCULAR | Status: DC | PRN

## 2021-12-14 MED ORDER — ACETAMINOPHEN 650 MG RE SUPP: 650.0000 mg | RECTAL | Status: DC | PRN

## 2021-12-14 MED ORDER — FENTANYL CITRATE (PF) 100 MCG/2ML IJ SOLN
INTRAMUSCULAR | Status: DC | PRN
  Administered 2021-12-14 (×3): 50 ug via INTRAVENOUS

## 2021-12-14 MED ORDER — HEPARIN SODIUM (PORCINE) 1000 UNIT/ML IJ SOLN
INTRAMUSCULAR | Status: AC
  Filled 2021-12-14: qty 10

## 2021-12-14 MED ORDER — DEXAMETHASONE SODIUM PHOSPHATE 10 MG/ML IJ SOLN
INTRAMUSCULAR | Status: DC | PRN
Start: 2021-12-14 — End: 2021-12-14
  Administered 2021-12-14: 5 mg via INTRAVENOUS

## 2021-12-14 MED ORDER — ASPIRIN 81 MG PO CHEW
81.0000 mg | CHEWABLE_TABLET | Freq: Every day | ORAL | Status: DC
  Administered 2021-12-15: 81 mg via ORAL
  Filled 2021-12-14: qty 1

## 2021-12-14 MED ORDER — ACETAMINOPHEN 325 MG PO TABS: 650.0000 mg | ORAL_TABLET | ORAL | Status: DC | PRN

## 2021-12-14 MED ORDER — CHLORHEXIDINE GLUCONATE 0.12 % MT SOLN
OROMUCOSAL | Status: AC
  Administered 2021-12-14: 15 mL
  Filled 2021-12-14: qty 15

## 2021-12-14 MED ORDER — CEFAZOLIN SODIUM-DEXTROSE 2-4 GM/100ML-% IV SOLN
2.0000 g | INTRAVENOUS | Status: AC
  Administered 2021-12-14: 2 g via INTRAVENOUS
  Filled 2021-12-14: qty 100

## 2021-12-14 MED ORDER — CLEVIDIPINE BUTYRATE 0.5 MG/ML IV EMUL
INTRAVENOUS | Status: DC | PRN
  Administered 2021-12-14: 2 mg/h via INTRAVENOUS

## 2021-12-14 MED ORDER — ONDANSETRON HCL 4 MG/2ML IJ SOLN: 4.0000 mg | Freq: Four times a day (QID) | INTRAMUSCULAR | Status: DC | PRN

## 2021-12-14 MED ORDER — ACETAMINOPHEN 160 MG/5ML PO SOLN
650.0000 mg | ORAL | Status: DC | PRN
Start: 2021-12-14 — End: 2021-12-15

## 2021-12-14 MED ORDER — LIDOCAINE HCL 1 % IJ SOLN
INTRAMUSCULAR | Status: AC
  Filled 2021-12-14: qty 20

## 2021-12-14 MED ORDER — CHLORHEXIDINE GLUCONATE CLOTH 2 % EX PADS
6.0000 | MEDICATED_PAD | Freq: Every day | CUTANEOUS | Status: DC
  Administered 2021-12-14 – 2021-12-15 (×2): 6 via TOPICAL

## 2021-12-14 MED ORDER — LIDOCAINE 2% (20 MG/ML) 5 ML SYRINGE
INTRAMUSCULAR | Status: DC | PRN
  Administered 2021-12-14: 60 mg via INTRAVENOUS

## 2021-12-14 MED ORDER — PHENYLEPHRINE HCL-NACL 20-0.9 MG/250ML-% IV SOLN
INTRAVENOUS | Status: DC | PRN
  Administered 2021-12-14: 25 ug/min via INTRAVENOUS

## 2021-12-14 MED ORDER — CLEVIDIPINE BUTYRATE 0.5 MG/ML IV EMUL
0.0000 mg/h | INTRAVENOUS | Status: DC
  Administered 2021-12-14: 2 mg/h via INTRAVENOUS
  Filled 2021-12-14 (×2): qty 50

## 2021-12-14 MED ORDER — VERAPAMIL HCL 2.5 MG/ML IV SOLN
INTRAVENOUS | Status: AC | PRN
Start: 2021-12-14 — End: 2021-12-14
  Administered 2021-12-14: 5 mg via INTRA_ARTERIAL

## 2021-12-14 NOTE — Progress Notes (Signed)
?   12/14/21 1500  ?First Vascular Site Assessment  ?#1 - Location of Site Assessment Right distal radial  ?#1 - Vascular Site Assessment Scale Level 1  ?#1 - Hematoma present? No  ?RUE Neurovascular Assessment  ?RUE Capillary Refill  Less than/equal to 3 seconds  ?RUE Color  Appropriate for ethnicity  ?RUE Temperature/Moisture  Warm  ?R Radial Pulse +2  ? ?Dr. Debbrah Alar called regarding very small amount of oozing from distal radial access site one hour after band removal. RN instructed to apply band to site, add 5 cc of air to band, and wait 30 minutes before attempting band deflation again. Band applied to site at 1515.  ?

## 2021-12-14 NOTE — Transfer of Care (Signed)
Immediate Anesthesia Transfer of Care Note ? ?Patient: Daniel Griffin ? ?Procedure(s) Performed: IR WITH ANESTHESIA EMBOLIZATION ? ?Patient Location: PACU ? ?Anesthesia Type:General ? ?Level of Consciousness: awake, alert  and oriented ? ?Airway & Oxygen Therapy: Patient Spontanous Breathing and Patient connected to nasal cannula oxygen ? ?Post-op Assessment: Report given to RN, Post -op Vital signs reviewed and stable, Patient moving all extremities X 4 and Patient able to stick tongue midline ? ?Post vital signs: Reviewed and stable ? ?Last Vitals:  ?Vitals Value Taken Time  ?BP    ?Temp    ?Pulse 64 12/14/21 1105  ?Resp 22 12/14/21 1105  ?SpO2 99 % 12/14/21 1105  ?Vitals shown include unvalidated device data. ? ?Last Pain:  ?Vitals:  ? 12/14/21 0707  ?TempSrc:   ?PainSc: 0-No pain  ?   ? ?  ? ?Complications: No notable events documented. ?

## 2021-12-14 NOTE — Procedures (Signed)
INTERVENTIONAL NEURORADIOLOGY BRIEF POSTPROCEDURE NOTE ? ?DIAGNOSTIC CEREBRAL ANGIOGRAM AND ENDOVASCULAR ANEURYSM EMBOLIZATION ? ?Attending: Dr. Pedro Earls ? ?Assistant: Dr. Frazier Richards ? ?Diagnosis: Right MCA bifurcation aneurysm ? ?Access site: Distal right radial artery ? ?Access closure: Inflatable band ? ?Anesthesia: GETA ? ?Medication used: Refer to anesthesia documentation. ? ?Complications: None. ? ?Estimated blood loss: None. ? ?Specimen: None. ? ?Findings: A 3 mm right MCA bifurcation aneurysm. Primary coil embolization performed with complete aneurysm occlusion. No thromboembolic or hemorrhagic complication. ? ?The patient tolerated the procedure well without incident or complication and is in stable condition.  ? ?PLAN: ?- Transfer to ICU for overnight observation. ?- Access site care per orders. ? ? ?  ?

## 2021-12-14 NOTE — Progress Notes (Signed)
?   12/14/21 1800  ?First Vascular Site Assessment  ?#1 - Location of Site Assessment Right distal radial  ?#1 - Vascular Site Assessment Scale Level 1  ?#1 - Hematoma present? No  ?#1 - Air in Band 3 cc ?(see progress note, new bleeding, band reapplied per dr. Debbrah Alar)  ?RUE Neurovascular Assessment  ?RUE Capillary Refill  Less than/equal to 3 seconds  ?RUE Color  Appropriate for ethnicity  ?RUE Temperature/Moisture  Warm  ?R Radial Pulse +2  ? ?RN entered room for 1800 neurovascular check to find new small amount of oozing on transparent dressing. Dr. Debbrah Alar called, RN instructed to reapply pressure band with 3 cc of air and leave overnight. Per Dr. Debbrah Alar, perform neurovascular checks Q15x4, Q30x2, then Q1 for remainder of time band remains in place beginning at 1800. Pt. Educated on physical signs of bleeding/hematoma and encouraged to report any signs of decreased perfusion in hand overnight.  ?

## 2021-12-14 NOTE — Progress Notes (Signed)
Patient is s/p coil embolization of right MCA bifurcation aneurysm without complication. ?Pt is awake, alert and without apparent neuro deficit ?TR band has 2cc air remaining.  A small bruise has formed over the access site. ?With no stent placement, can d/c Brilinta.  After ASA '81mg'$  tomorrow am, may d/c ASA. ? ? ?Electronically Signed: ?Pasty Spillers, PA-C ?12/14/2021, 1:25 PM ? ? ? ?

## 2021-12-14 NOTE — Sedation Documentation (Addendum)
Patient transported to PACU with [place]. Phillip RN at the bedside to receive patient. Report given. Snuffbox in place via right distal radial access. +2 palpable pulse intact. No drainage noted from site. Patient is alert and oriented, following commands and moving everything purposefully.  ?

## 2021-12-14 NOTE — Anesthesia Procedure Notes (Signed)
Arterial Line Insertion Performed by: Hasheem Voland B, CRNA, CRNA  Patient location: Pre-op. Preanesthetic checklist: patient identified, IV checked, site marked, risks and benefits discussed, surgical consent, monitors and equipment checked, pre-op evaluation, timeout performed and anesthesia consent Lidocaine 1% used for infiltration Left, radial was placed Catheter size: 20 G Hand hygiene performed , maximum sterile barriers used  and Seldinger technique used Allen's test indicative of satisfactory collateral circulation Attempts: 1 Procedure performed without using ultrasound guided technique. Following insertion, dressing applied and Biopatch. Post procedure assessment: normal  Patient tolerated the procedure well with no immediate complications.    

## 2021-12-14 NOTE — Anesthesia Procedure Notes (Signed)
Procedure Name: Intubation ?Date/Time: 12/14/2021 8:45 AM ?Performed by: Valda Favia, CRNA ?Pre-anesthesia Checklist: Patient identified, Emergency Drugs available, Suction available and Patient being monitored ?Patient Re-evaluated:Patient Re-evaluated prior to induction ?Oxygen Delivery Method: Circle System Utilized ?Preoxygenation: Pre-oxygenation with 100% oxygen ?Induction Type: IV induction ?Ventilation: Mask ventilation without difficulty and Oral airway inserted - appropriate to patient size ?Laryngoscope Size: Mac and 4 ?Grade View: Grade I ?Tube type: Oral ?Number of attempts: 1 ?Airway Equipment and Method: Stylet and Oral airway ?Placement Confirmation: ETT inserted through vocal cords under direct vision, positive ETCO2 and breath sounds checked- equal and bilateral ?Secured at: 22 cm ?Tube secured with: Tape ?Dental Injury: Teeth and Oropharynx as per pre-operative assessment  ? ? ? ? ?

## 2021-12-14 NOTE — Plan of Care (Signed)
?  Problem: Education: Goal: Knowledge of General Education information will improve Description: Including pain rating scale, medication(s)/side effects and non-pharmacologic comfort measures Outcome: Progressing   Problem: Clinical Measurements: Goal: Ability to maintain clinical measurements within normal limits will improve Outcome: Progressing   Problem: Elimination: Goal: Will not experience complications related to urinary retention Outcome: Progressing   

## 2021-12-14 NOTE — Anesthesia Postprocedure Evaluation (Signed)
Anesthesia Post Note ? ?Patient: DACE DENN ? ?Procedure(s) Performed: IR WITH ANESTHESIA EMBOLIZATION ? ?  ? ?Patient location during evaluation: PACU ?Anesthesia Type: General ?Level of consciousness: awake and alert ?Pain management: pain level controlled ?Vital Signs Assessment: post-procedure vital signs reviewed and stable ?Respiratory status: spontaneous breathing, nonlabored ventilation and respiratory function stable ?Cardiovascular status: blood pressure returned to baseline and stable ?Postop Assessment: no apparent nausea or vomiting ?Anesthetic complications: no ? ? ?No notable events documented. ? ?Last Vitals:  ?Vitals:  ? 12/14/21 1302 12/14/21 1317  ?BP: 136/65   ?Pulse: (!) 58 (!) 58  ?Resp: 17 18  ?Temp:    ?SpO2: 91% 91%  ?  ?Last Pain:  ?Vitals:  ? 12/14/21 1232  ?TempSrc:   ?PainSc: 0-No pain  ? ? ?  ?  ?  ?  ?  ?  ? ?Dafney Farler,W. EDMOND ? ? ? ? ?

## 2021-12-15 ENCOUNTER — Encounter (HOSPITAL_COMMUNITY): Payer: Self-pay | Admitting: Radiology

## 2021-12-15 NOTE — Discharge Summary (Signed)
? ? ?Patient ID: ?Daniel Griffin ?MRN: [id number] ?DOB/AGE: 1961-01-11 61 y.o. ? ?Admit date: 12/14/2021 ?Discharge date: 12/15/2021 ? ?Supervising Physician: Pedro Earls ? ?Patient Status: [facility] - In-pt ? ?Admission Diagnoses:  ?Brain aneurysm ? ?Discharge Diagnoses:  ?Principal Problem: ?  Brain aneurysm ? ? ?Discharged Condition: good ? ?Hospital Course:  ? ?Daniel Griffin is a 61 y.o. male with dementia, rheumatoid arthritis, remote history of alcohol abuse, and right MCA bifurcation aneurysm. Patient recently underwent cerebral angiogram 09/01/21 which showed 101m bifucation aneurysm in the cervical right ICA with luminal irregularity and associated pseudoaneurysm. He subsequently met with Dr. dKarenann Cai2/6/23 to discuss these results.  After discussion patient and sister elected to proceed with intervention. [facility] and is now s/p coil embolization of his R MCA bifurcation aneurysm.  He was admitted overnight for observation and has progressed well.  He did have some prolonged oozing from his right wrist s/p procedure requiring re-inflation of the TR band, however has beenable to remove the band completely today without further signs of bleeding.  He has been OOB with stability, has been able to eat and drink with tolerance, and is otherwise ready for discharge home.  He is planning to d/c home in the care of his daughter.  ?He no longer needs to take Brilinta or aspirin at home and can discontinue these medications.  ? ?Discharge Exam: ?Blood pressure (!) 160/90, pulse 66, temperature 98.3 ?F (36.8 ?C), temperature source Oral, resp. rate (!) 25, height _0  (1.702 m), weight 200 lb (90.7 kg), SpO2 93 %. ?General appearance: alert, cooperative, and no distress ?Resp: clear to auscultation bilaterally ?Cardio: regular rate and rhythm, S1, S2 normal, no murmur, click, rub or gallop ?GI: soft, non-tender; bowel sounds normal; no masses,  no  organomegaly ?Skin: Skin color, texture, turgor normal. No rashes or lesions ?Incision/Wound: Procedure site intact.  Non-tender.  No evidence of pseudoaneurysm or hematoma.  ? ?Disposition: Discharge disposition: 01-Home or Self Care ? ? ? ? ? ? ?Discharge Instructions   ? ? Call MD for:  persistant dizziness or light-headedness   Complete by: As directed ?  ? Call MD for:  persistant nausea and vomiting   Complete by: As directed ?  ? Call MD for:  redness, tenderness, or signs of infection (pain, swelling, redness, odor or green/yellow discharge around incision site)   Complete by: As directed ?  ? Call MD for:  severe uncontrolled pain   Complete by: As directed ?  ? Diet - low sodium heart healthy   Complete by: As directed ?  ? Discharge instructions   Complete by: As directed ?  ? Increase activity slowly at home.  ?No lifting more than 10 lbs with the right arm for 5 days. ?Change bandage to arm as needed. ?Return to ED if there is oozing or bleeding from the right wrist procedure site.  ? Increase activity slowly   Complete by: As directed ?  ? No dressing needed   Complete by: As directed ?  ? ?  ? ?Allergies as of 12/15/2021   ?No Known Allergies ?  ? ?  ?Medication List  ?  ? ?STOP taking these medications   ? ?aspirin EC 81 MG tablet ?  ?Brilinta 90 MG Tabs tablet ?Generic drug: ticagrelor ?  ? ?  ? ?TAKE these medications   ? ?buPROPion 150 MG 24 hr tablet ?Commonly known as: Wellbutrin XL ?  Take 1 tablet (150 mg total) by mouth daily. ?What changed: when to take this ?  ?escitalopram 10 MG tablet ?Commonly known as: LEXAPRO ?Take 1 tablet (10 mg total) by mouth daily. ?What changed: when to take this ?  ?methocarbamol 500 MG tablet ?Commonly known as: Robaxin ?Take 1 tablet (500 mg total) by mouth every 6 (six) hours as needed for muscle spasms. ?  ?ondansetron 4 MG tablet ?Commonly known as: Zofran ?Take 1 tablet (4 mg total) by mouth every 8 (eight) hours as needed for nausea, vomiting or refractory  nausea / vomiting. ?  ?Orencia ClickJect 862 MG/ML Soaj ?Generic drug: Abatacept ?INJECT 125 MG INTO THE SKIN ONCE A WEEK. ?  ?oxyCODONE-acetaminophen 5-325 MG tablet ?Commonly known as: Percocet ?Take 1 tablet by mouth every 4 (four) hours as needed for severe pain. ?  ? ?  ? ?  ?  ? ? ?  ?Discharge Care Instructions  ?(From admission, onward)  ?  ? ? ?  ? ?  Start     Ordered  ? 12/15/21 0000  No dressing needed       ? 12/15/21 1509  ? ?  ?  ? ?  ? ? Follow-up Information   ? ? de Rosario Jacks, MD Follow up.   ?Specialties: Radiology, Interventional Radiology ?Why: Scheduler will contact you with date and time of follow-up appointment. ?Contact information: ?[address] ?[place] [address] ?[phone] ? ? ?  ?  ? ?  ?  ? ?  ?  ? ?Electronically Signed: ?Docia Barrier, PA ?12/15/2021, 3:10 PM ? ? ?I have spent Greater Than 30 Minutes discharging Daniel Griffin. ? ? ? ? ?

## 2021-12-15 NOTE — Progress Notes (Signed)
?  Transition of Care (TOC) Screening Note ? ? ?Patient Details  ?Name: Daniel Griffin ?Date of Birth: 1961/05/20 ? ? ?Transition of Care (TOC) CM/SW Contact:    ?Benard Halsted, LCSW ?Phone Number: ?12/15/2021, 10:04 AM ? ? ? ?Transition of Care Department [facility]) has reviewed patient and no TOC needs have been identified at this time. We will continue to monitor patient advancement through interdisciplinary progression rounds. If new patient transition needs arise, please place a TOC consult. ? ? ?

## 2021-12-20 ENCOUNTER — Other Ambulatory Visit: Payer: Self-pay | Admitting: Rheumatology

## 2021-12-20 ENCOUNTER — Other Ambulatory Visit (HOSPITAL_COMMUNITY): Payer: Self-pay

## 2021-12-20 MED ORDER — ORENCIA CLICKJECT 125 MG/ML ~~LOC~~ SOAJ
125.0000 mg | SUBCUTANEOUS | 0 refills | Status: DC
  Filled 2021-12-20: qty 12, 84d supply, fill #0
  Filled 2021-12-22: qty 4, 28d supply, fill #0
  Filled 2022-01-24: qty 4, 28d supply, fill #1
  Filled 2022-02-22: qty 4, 28d supply, fill #2

## 2021-12-20 NOTE — Telephone Encounter (Signed)
Next Visit: Due June 2023. Message sent to the front to schedule. ? ?Last Visit: 09/12/2021 ? ?Last Fill: 09/12/2021 ? ?[id number] arthritis involving multiple sites with positive rheumatoid factor  ? ?Current Dose per office note 09/12/2021: Orencia 125 mg sq injections once weekly ? ?Labs: 12/14/2021 Glucose 105 ? ?TB Gold: 09/12/2021 Neg   ? ?Okay to refill Orencia?  ?

## 2021-12-20 NOTE — Telephone Encounter (Signed)
Patient's sister, [name], called the office requesting a refill of Orencia for [place]. If anything is needed before he can fill it please call [name] at [phone] ?

## 2021-12-20 NOTE — Telephone Encounter (Signed)
Please schedule patient for a follow up visit. Patient due June 2023. Thanks! 

## 2021-12-21 NOTE — Telephone Encounter (Signed)
LMOM (home & cell) to schedule 5 month follow-up appointment. ?

## 2021-12-22 ENCOUNTER — Other Ambulatory Visit (HOSPITAL_COMMUNITY): Payer: Self-pay

## 2021-12-27 ENCOUNTER — Other Ambulatory Visit (HOSPITAL_COMMUNITY): Payer: Self-pay

## 2022-01-24 ENCOUNTER — Other Ambulatory Visit (HOSPITAL_COMMUNITY): Payer: Self-pay

## 2022-01-25 ENCOUNTER — Other Ambulatory Visit (HOSPITAL_COMMUNITY): Payer: Self-pay

## 2022-01-31 ENCOUNTER — Ambulatory Visit (INDEPENDENT_AMBULATORY_CARE_PROVIDER_SITE_OTHER): Payer: BC Managed Care – PPO | Admitting: Nurse Practitioner

## 2022-01-31 ENCOUNTER — Encounter: Payer: Self-pay | Admitting: Nurse Practitioner

## 2022-01-31 VITALS — BP 108/63 | HR 58 | Temp 97.3°F | Resp 20 | Ht 67.0 in | Wt 194.0 lb

## 2022-01-31 DIAGNOSIS — F03B4 Unspecified dementia, moderate, with anxiety: Secondary | ICD-10-CM | POA: Diagnosis not present

## 2022-01-31 DIAGNOSIS — M0579 Rheumatoid arthritis with rheumatoid factor of multiple sites without organ or systems involvement: Secondary | ICD-10-CM

## 2022-01-31 DIAGNOSIS — F322 Major depressive disorder, single episode, severe without psychotic features: Secondary | ICD-10-CM

## 2022-01-31 MED ORDER — ESCITALOPRAM OXALATE 10 MG PO TABS: 10.0000 mg | ORAL_TABLET | Freq: Every day | ORAL | 1 refills | Status: DC

## 2022-01-31 MED ORDER — BUPROPION HCL ER (XL) 150 MG PO TB24: 150.0000 mg | ORAL_TABLET | Freq: Every day | ORAL | 1 refills | Status: DC

## 2022-01-31 NOTE — Progress Notes (Signed)
Subjective:    Patient ID: Daniel Griffin, male    DOB: 1961/08/05, 61 y.o.   MRN: [id number]   Chief Complaint: medical management of chronic issues     HPI:  Daniel Griffin is a 61 y.o. who identifies as a male who was assigned male at birth.   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   Comes in today for follow up of the following chronic medical issues:  1. Moderate dementia with anxiety, unspecified dementia type ([place]) Comes and goes. Has not changed taht he is awareof.   2. Rheumatoid arthritis involving multiple sites with positive rheumatoid factor (HCC) Is on abatacept. See rhuematologist evry 3 months. Has intermittent pain.  3. Depression Is on lexapro and welbutrin XL. Combination is working well for him.     01/31/2022    8:22 AM 09/14/2021   10:56 AM 02/15/2021    3:07 PM  Depression screen PHQ 2/9  Decreased Interest 0 0 3  Down, Depressed, Hopeless 0 0 2  PHQ - 2 Score 0 0 5  Altered sleeping 0 0 3  Tired, decreased energy 0 0 3  Change in appetite 0 0 3  Feeling bad or failure about yourself  0 0 3  Trouble concentrating 0 0 3  Moving slowly or fidgety/restless 0 0 3  Suicidal thoughts 0 0 0  PHQ-9 Score 0 0 23  Difficult doing work/chores Not difficult at all Not difficult at all Somewhat difficult      01/31/2022    8:22 AM 09/14/2021   10:56 AM 02/15/2021    3:10 PM 01/21/2021   12:05 PM  GAD 7 : Generalized Anxiety Score  Nervous, Anxious, on Edge 0 0 3 0  Control/stop worrying 0 0 2 0  Worry too much - different things 0 0 2 0  Trouble relaxing 0 0 1 0  Restless 0 0 1 0  Easily annoyed or irritable 0 0 2 0  Afraid - awful might happen 0 0 1 0  Total GAD 7 Score 0 0 12 0  Anxiety Difficulty Not difficult at all Not difficult at all Somewhat difficult Not difficult at all       New complaints: None today  No Known Allergies Outpatient Encounter Medications as of 01/31/2022  Medication Sig    Abatacept (ORENCIA CLICKJECT) 099 MG/ML SOAJ INJECT 125 MG INTO THE SKIN ONCE A WEEK.   buPROPion (WELLBUTRIN XL) 150 MG 24 hr tablet Take 1 tablet (150 mg total) by mouth daily. (Patient taking differently: Take 150 mg by mouth at bedtime.)   escitalopram (LEXAPRO) 10 MG tablet Take 1 tablet (10 mg total) by mouth daily. (Patient taking differently: Take 10 mg by mouth at bedtime.)   methocarbamol (ROBAXIN) 500 MG tablet Take 1 tablet (500 mg total) by mouth every 6 (six) hours as needed for muscle spasms. (Patient not taking: Reported on 12/08/2021)   ondansetron (ZOFRAN) 4 MG tablet Take 1 tablet (4 mg total) by mouth every 8 (eight) hours as needed for nausea, vomiting or refractory nausea / vomiting. (Patient not taking: Reported on 12/08/2021)   oxyCODONE-acetaminophen (PERCOCET) 5-325 MG tablet Take 1 tablet by mouth every 4 (four) hours as needed for severe pain. (Patient not taking: Reported on 12/08/2021)   No facility-administered encounter medications on file as of 01/31/2022.    Past Surgical History:  Procedure Laterality Date   ANKLE FRACTURE SURGERY     Left  APPENDECTOMY     IR 3D INDEPENDENT WKST  09/01/2021   IR 3D INDEPENDENT WKST  12/14/2021   IR ANGIO INTRA EXTRACRAN SEL INTERNAL CAROTID BILAT MOD SED  09/01/2021   IR ANGIO INTRA EXTRACRAN SEL INTERNAL CAROTID UNI R MOD SED  12/14/2021   IR ANGIO VERTEBRAL SEL VERTEBRAL BILAT MOD SED  09/01/2021   IR ANGIOGRAM FOLLOW UP STUDY  12/14/2021   IR ANGIOGRAM FOLLOW UP STUDY  12/14/2021   IR ANGIOGRAM FOLLOW UP STUDY  12/14/2021   IR CT HEAD LTD  12/14/2021   IR TRANSCATH/EMBOLIZ  12/14/2021   IR US GUIDE VASC ACCESS RIGHT  09/01/2021   IR US GUIDE VASC ACCESS RIGHT  12/14/2021   RADIOLOGY WITH ANESTHESIA N/A 12/14/2021   Procedure: IR WITH ANESTHESIA EMBOLIZATION;  Surgeon: Pedro Earls, MD;  Location: [facility];  Service: Radiology;  Laterality: N/A;   REVERSE SHOULDER ARTHROPLASTY Left 05/13/2021   Procedure: REVERSE  SHOULDER ARTHROPLASTY;  Surgeon: Netta Cedars, MD;  Location: [facility];  Service: Orthopedics;  Laterality: Left;  with ISB   REVERSE SHOULDER ARTHROPLASTY Right 10/14/2021   Procedure: REVERSE SHOULDER ARTHROPLASTY;  Surgeon: Netta Cedars, MD;  Location: [facility];  Service: Orthopedics;  Laterality: Right;  with ISB    Family History  Problem Relation Age of Onset   Alzheimer's disease Father    Healthy Son    Healthy Daughter    Rheum arthritis Daughter    Healthy Daughter       Controlled substance contract: n/a     Review of Systems  Constitutional:  Negative for diaphoresis.  Eyes:  Negative for pain.  Respiratory:  Negative for shortness of breath.   Cardiovascular:  Negative for chest pain, palpitations and leg swelling.  Gastrointestinal:  Negative for abdominal pain.  Endocrine: Negative for polydipsia.  Skin:  Negative for rash.  Neurological:  Negative for dizziness, weakness and headaches.  Hematological:  Does not bruise/bleed easily.  All other systems reviewed and are negative.      Objective:   Physical Exam Vitals and nursing note reviewed.  Constitutional:      Appearance: Normal appearance.  HENT:     Right Ear: Tympanic membrane normal.     Left Ear: Tympanic membrane normal.     Mouth/Throat:     Mouth: Mucous membranes are moist.  Eyes:     Extraocular Movements: Extraocular movements intact.     Pupils: Pupils are equal, round, and reactive to light.  Cardiovascular:     Rate and Rhythm: Normal rate and regular rhythm.     Heart sounds: Normal heart sounds.  Pulmonary:     Effort: Pulmonary effort is normal.     Breath sounds: Normal breath sounds.  Abdominal:     General: Abdomen is flat.     Palpations: Abdomen is soft.  Musculoskeletal:     Comments: All over jjoint effusions , mainly in bil knees.  Skin:    General: Skin is warm.  Neurological:     General: No focal deficit present.     Mental Status: He is alert and oriented to  person, place, and time.  Psychiatric:        Mood and Affect: Mood normal.        Behavior: Behavior normal.    BP 108/63   Pulse (!) 58   Temp (!) 97.3 F (36.3 C) (Temporal)   Resp 20   Ht $R'5\' 7"'FR$  (1.702 m)   Wt 194 lb (88  kg)   SpO2 98%   BMI 30.38 kg/m         Assessment & Plan:   Daniel Griffin comes in today with chief complaint of Medical Management of Chronic Issues   Diagnosis and orders addressed:  1. Moderate dementia with anxiety, unspecified dementia type ([place]) Do brain exercises daily- reading, puzzle games etc.  2. Rheumatoid arthritis involving multiple sites with positive rheumatoid factor ([place]) Keep follow up with rheumatology - CBC with Differential/Platelet - CMP14+EGFR - Lipid panel  3. Depression, major, single episode, severe (HCC) Stress management - escitalopram (LEXAPRO) 10 MG tablet; Take 1 tablet (10 mg total) by mouth daily.  Dispense: 90 tablet; Refill: 1   Labs pending Health Maintenance reviewed Diet and exercise encouraged  Follow up plan: 6 months   Mary-Margaret Hassell Done, FNP

## 2022-01-31 NOTE — Patient Instructions (Signed)

## 2022-02-02 NOTE — Progress Notes (Signed)
Office Visit Note  Patient: Daniel Griffin             Date of Birth: [birth year]           MRN: [id number]             PCP: Chevis Pretty, FNP Referring: Chevis Pretty, * Visit Date: 02/09/2022 Occupation: '@GUAROCC'$ @  Subjective:  Medication monitoring   History of Present Illness: CHISUM HABENICHT is a 61 y.o. male with history of seropositive rheumatoid arthritis and osteoarthritis.  He is on orencia 125 mg sq injections once weekly.  He continues to tolerate Orencia without any side effects or injection site reactions.  He states that 1.5 weeks ago he developed a flare in his left knee which lasted 3 days.  He states he took Tylenol which alleviated his symptoms.  He states during the flare he was experiencing swelling in the left knee joint.  He was unable to identify a trigger for the flare since he has not had any change in activity or missed doses of Orencia.  He denies any other joint pain or joint swelling at this time.  He states that this is the first flare he has had since initiating Orencia.  He states he is hoping to have the left knee replaced in the fall.  He reports that he had a right reverse shoulder replacement performed by Dr. Veverly Fells on 10/14/2021 which has been doing well. He states he underwent a procedure for a brain aneurysm on 12/14/21 at which time he held [place] around the time of the procedure.   He denies any recent infections.  Activities of Daily Living:  Patient reports morning stiffness for 40 minutes.   Patient Reports nocturnal pain.  Difficulty dressing/grooming: Denies Difficulty climbing stairs: Reports Difficulty getting out of chair: Reports Difficulty using hands for taps, buttons, cutlery, and/or writing: Reports  Review of Systems  Constitutional:  Negative for fatigue and night sweats.  HENT:  Negative for mouth sores, mouth dryness and nose dryness.   Eyes:  Positive for dryness. Negative for redness.  Respiratory:   Negative for difficulty breathing.   Cardiovascular:  Positive for swelling in legs/feet. Negative for chest pain, palpitations, hypertension and irregular heartbeat.  Gastrointestinal:  Negative for constipation and diarrhea.  Endocrine: Negative for increased urination.  Genitourinary:  Negative for difficulty urinating and painful urination.  Musculoskeletal:  Positive for joint pain, gait problem, joint pain, joint swelling, morning stiffness and muscle tenderness. Negative for myalgias, muscle weakness and myalgias.  Skin:  Negative for color change, rash, hair loss, nodules/bumps, skin tightness, ulcers and sensitivity to sunlight.  Allergic/Immunologic: Negative for susceptible to infections.  Neurological:  Negative for dizziness, fainting, memory loss and night sweats.  Hematological:  Positive for bruising/bleeding tendency. Negative for swollen glands.  Psychiatric/Behavioral:  Positive for sleep disturbance. Negative for depressed mood. The patient is not nervous/anxious.     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   Syphilis 09/21/2020   Dementia ([place]) 09/21/2020   Rheumatoid arthritis involving multiple sites with positive rheumatoid factor ([place]) 11/30/2016   High risk medications (not anticoagulants) long-term use 11/30/2016    Past Medical History:  Diagnosis Date   Arthritis    RA   Depression    Seizures ([place]) 06/21/2020   ETOH w/d related    Family History  Problem Relation Age of Onset  Alzheimer's disease Father    Healthy Son    Healthy Daughter    Rheum arthritis Daughter    Healthy Daughter    Past Surgical History:  Procedure Laterality Date   ANKLE FRACTURE SURGERY     Left   APPENDECTOMY     IR 3D INDEPENDENT WKST  09/01/2021   IR 3D INDEPENDENT WKST  12/14/2021   IR ANGIO INTRA EXTRACRAN SEL INTERNAL CAROTID BILAT MOD SED  09/01/2021   IR  ANGIO INTRA EXTRACRAN SEL INTERNAL CAROTID UNI R MOD SED  12/14/2021   IR ANGIO VERTEBRAL SEL VERTEBRAL BILAT MOD SED  09/01/2021   IR ANGIOGRAM FOLLOW UP STUDY  12/14/2021   IR ANGIOGRAM FOLLOW UP STUDY  12/14/2021   IR ANGIOGRAM FOLLOW UP STUDY  12/14/2021   IR CT HEAD LTD  12/14/2021   IR TRANSCATH/EMBOLIZ  12/14/2021   IR US GUIDE VASC ACCESS RIGHT  09/01/2021   IR US GUIDE VASC ACCESS RIGHT  12/14/2021   RADIOLOGY WITH ANESTHESIA N/A 12/14/2021   Procedure: IR WITH ANESTHESIA EMBOLIZATION;  Surgeon: Pedro Earls, MD;  Location: [facility];  Service: Radiology;  Laterality: N/A;   REVERSE SHOULDER ARTHROPLASTY Left 05/13/2021   Procedure: REVERSE SHOULDER ARTHROPLASTY;  Surgeon: Netta Cedars, MD;  [facility];  Service: Orthopedics;  Laterality: Left;  with ISB   REVERSE SHOULDER ARTHROPLASTY Right 10/14/2021   Procedure: REVERSE SHOULDER ARTHROPLASTY;  Surgeon: Netta Cedars, MD;  [facility];  Service: Orthopedics;  Laterality: Right;  with ISB   Social History   Social History Narrative   Lives alone   Right Handed   Rarely drinks caffeine       Immunization History  Administered Date(s) Administered   Influenza,inj,Quad PF,6+ Mos 07/04/2018     Objective: Vital Signs: BP (!) 150/77 (BP Location: Left Arm, Patient Position: Sitting, Cuff Size: Normal)   Pulse 66   Resp 17   Ht '5\' 6"'$  (1.676 m)   Wt 192 lb (87.1 kg)   BMI 30.99 kg/m    Physical Exam Vitals and nursing note reviewed.  Constitutional:      Appearance: He is well-developed.  HENT:     Head: Normocephalic and atraumatic.  Eyes:     Conjunctiva/sclera: Conjunctivae normal.     Pupils: Pupils are equal, round, and reactive to light.  Cardiovascular:     Rate and Rhythm: Normal rate and regular rhythm.     Heart sounds: Normal heart sounds.  Pulmonary:     Effort: Pulmonary effort is normal.     Breath sounds: Normal breath sounds.  Abdominal:     General: Bowel sounds are normal.      Palpations: Abdomen is soft.  Musculoskeletal:     Cervical back: Normal range of motion and neck supple.  Skin:    General: Skin is warm and dry.     Capillary Refill: Capillary refill takes less than 2 seconds.  Neurological:     Mental Status: He is alert and oriented to person, place, and time.  Psychiatric:        Behavior: Behavior normal.      Musculoskeletal Exam: C-spine, thoracic spine, and lumbar spine good ROM. Bilateral shoulder replacements have good ROM with no tenderness.  Elbow joints, wrist joints, Mcps, PIPs, and DIPs good ROM with no synovitis.  Complete fist formation bilaterally.  PIP and DIP thickening consistent with OA of both hands.  Hip joints have good ROM.  Knee joints have good ROM with  no warmth or effusion.  Ankle joints have good ROM with no tenderness or joint swelling.   CDAI Exam: CDAI Score: 0.4  Patient Global: 2 mm; Provider Global: 2 mm Swollen: 0 ; Tender: 0  Joint Exam 02/09/2022   No joint exam has been documented for this visit   There is currently no information documented on the homunculus. Go to the Rheumatology activity and complete the homunculus joint exam.  Investigation: No additional findings.  Imaging: No results found.  Recent Labs: Lab Results  Component Value Date   WBC 7.6 12/14/2021   HGB 13.3 12/14/2021   PLT 251 12/14/2021   NA 138 12/14/2021   K 4.1 12/14/2021   CL 107 12/14/2021   CO2 24 12/14/2021   GLUCOSE 105 (H) 12/14/2021   BUN 18 12/14/2021   CREATININE 0.77 12/14/2021   BILITOT 0.6 09/12/2021   ALKPHOS 62 05/06/2021   AST 12 09/12/2021   ALT 14 09/12/2021   PROT 6.4 09/12/2021   ALBUMIN 4.2 05/06/2021   CALCIUM 8.9 12/14/2021   GFRAA 112 02/25/2021   QFTBGOLD NEGATIVE 07/03/2017   QFTBGOLDPLUS NEGATIVE 09/12/2021    Speciality Comments: Prior therapy includes: Enbrel (pancytopenia) and methotrexate (inadequate response) Orencia-07/09/18 Enbrel-1/19-10/19  Procedures:  No procedures  performed Allergies: Patient has no known allergies.     Assessment / Plan:     Visit Diagnoses: Rheumatoid arthritis involving multiple sites with positive rheumatoid factor ([place]): He has no synovitis on examination today.  He had a flare in the left knee 1.5 weeks ago with no change in activity prior to the onset of symptoms.  The flare lasted for 3 days and his symptoms were alleviated by taking Tylenol.  He has not had any other flares of rheumatoid arthritis since prior to his last office visit.  Overall he is clinically been doing well on Orencia 125 mg subcu injections once weekly.  He continues to find Orencia to be more effective than Enbrel was previously.  He had his right shoulder replaced on 10/14/2021 by Dr. Veverly Fells with no complications.  He is planning on having his left knee replaced in the fall or early 2024. He will remain on Orencia as prescribed.  He was advised to notify us if he develops signs or symptoms of a flare.  He will follow-up in the office in 5 months or sooner if needed.  High risk medications (not anticoagulants) long-term use - Orencia 125 mg sq injections once weekly.  TB gold negative on 09/12/21.  Inadequate response to enbrel in the past.  CBC and BMP drawn on 12/14/2021.  Orders for CBC and CMP were released today.  His next lab work will be due in September and every 3 months to monitor for drug toxicity. Discussed the importance of holding Orencia if he develops signs or symptoms of an infection and to resume once the infection is completely cleared. - Plan: CBC with Differential/Platelet, COMPLETE METABOLIC PANEL WITH GFR  S/P shoulder replacement, left: Doing well.  Good range of motion with no discomfort at this time.  No tenderness upon palpation.  Status post replacement of right shoulder joint - Reverse-Performed by Dr. Veverly Fells on 10/14/21.  Doing well.  No complications.  Good range of motion with no tenderness upon palpation.  Primary osteoarthritis of  both hands: He has PIP and DIP thickening consistent with osteoarthritis of both hands.  Complete fist formation bilaterally.  Discussed the importance of joint protection and muscle strengthening.  Primary osteoarthritis of both knees:  X-rays of both knees were obtained on 08/27/2020 which were consistent with severe osteoarthritis and severe chondromalacia patella.  The patient continues to experience stiffness in both knee joints especially first thing in the morning and after sitting for prolonged periods of time.  1-1/2 weeks ago he experienced pain and swelling in the left knee joint which lasted 3 days.  Tylenol alleviated his symptoms.  He is planning on having the left knee replaced in the fall or early 2024.  Primary osteoarthritis of both feet: He is not experiencing any increased discomfort in his feet at this time.  He has good range of motion of both ankle joints with no tenderness or inflammation.  S/P coil embolization of cerebral aneurysm - 12/14/21 Performed by IR.  Other medical conditions are listed as follows:   Memory loss  Latent syphilis  History of alcohol abuse  Orders: Orders Placed This Encounter  Procedures   CBC with Differential/Platelet   COMPLETE METABOLIC PANEL WITH GFR   No orders of the defined types were placed in this encounter.    Follow-Up Instructions: Return in about 5 months (around 07/12/2022) for Rheumatoid arthritis, Osteoarthritis.   Ofilia Neas, PA-C  Note - This record has been created using Dragon software.  Chart creation errors have been sought, but may not always  have been located. Such creation errors do not reflect on  the standard of medical care.

## 2022-02-06 NOTE — Progress Notes (Signed)
lmtcb

## 2022-02-09 ENCOUNTER — Encounter: Payer: Self-pay | Admitting: Physician Assistant

## 2022-02-09 ENCOUNTER — Ambulatory Visit (INDEPENDENT_AMBULATORY_CARE_PROVIDER_SITE_OTHER): Payer: BC Managed Care – PPO | Admitting: Physician Assistant

## 2022-02-09 VITALS — BP 150/77 | HR 66 | Resp 17 | Ht 66.0 in | Wt 192.0 lb

## 2022-02-09 DIAGNOSIS — M19071 Primary osteoarthritis, right ankle and foot: Secondary | ICD-10-CM

## 2022-02-09 DIAGNOSIS — M19042 Primary osteoarthritis, left hand: Secondary | ICD-10-CM

## 2022-02-09 DIAGNOSIS — A530 Latent syphilis, unspecified as early or late: Secondary | ICD-10-CM

## 2022-02-09 DIAGNOSIS — Z96612 Presence of left artificial shoulder joint: Secondary | ICD-10-CM | POA: Diagnosis not present

## 2022-02-09 DIAGNOSIS — Z79899 Other long term (current) drug therapy: Secondary | ICD-10-CM

## 2022-02-09 DIAGNOSIS — F1011 Alcohol abuse, in remission: Secondary | ICD-10-CM

## 2022-02-09 DIAGNOSIS — M19072 Primary osteoarthritis, left ankle and foot: Secondary | ICD-10-CM

## 2022-02-09 DIAGNOSIS — Z9889 Other specified postprocedural states: Secondary | ICD-10-CM

## 2022-02-09 DIAGNOSIS — M25511 Pain in right shoulder: Secondary | ICD-10-CM | POA: Diagnosis not present

## 2022-02-09 DIAGNOSIS — M19041 Primary osteoarthritis, right hand: Secondary | ICD-10-CM

## 2022-02-09 DIAGNOSIS — R413 Other amnesia: Secondary | ICD-10-CM

## 2022-02-09 DIAGNOSIS — M0579 Rheumatoid arthritis with rheumatoid factor of multiple sites without organ or systems involvement: Secondary | ICD-10-CM | POA: Diagnosis not present

## 2022-02-09 DIAGNOSIS — G8929 Other chronic pain: Secondary | ICD-10-CM

## 2022-02-09 DIAGNOSIS — M170 Bilateral primary osteoarthritis of knee: Secondary | ICD-10-CM

## 2022-02-09 DIAGNOSIS — Z96611 Presence of right artificial shoulder joint: Secondary | ICD-10-CM

## 2022-02-09 NOTE — Patient Instructions (Signed)

## 2022-02-10 LAB — CBC WITH DIFFERENTIAL/PLATELET
Absolute Monocytes: 737 cells/uL (ref 200–950)
Basophils Absolute: 41 cells/uL (ref 0–200)
Basophils Relative: 0.5 %
Eosinophils Absolute: 41 cells/uL (ref 15–500)
Eosinophils Relative: 0.5 %
HCT: 41 % (ref 38.5–50.0)
Hemoglobin: 13.7 g/dL (ref 13.2–17.1)
Lymphs Abs: 1304 cells/uL (ref 850–3900)
MCH: 30.3 pg (ref 27.0–33.0)
MCHC: 33.4 g/dL (ref 32.0–36.0)
MCV: 90.7 fL (ref 80.0–100.0)
MPV: 9.9 fL (ref 7.5–12.5)
Monocytes Relative: 9.1 %
Neutro Abs: 5978 cells/uL (ref 1500–7800)
Neutrophils Relative %: 73.8 %
Platelets: 291 10*3/uL (ref 140–400)
RBC: 4.52 10*6/uL (ref 4.20–5.80)
RDW: 12.6 % (ref 11.0–15.0)
Total Lymphocyte: 16.1 %
WBC: 8.1 10*3/uL (ref 3.8–10.8)

## 2022-02-10 LAB — COMPLETE METABOLIC PANEL WITH GFR
AG Ratio: 1.5 (calc) (ref 1.0–2.5)
ALT: 16 U/L (ref 9–46)
AST: 18 U/L (ref 10–35)
Albumin: 4.1 g/dL (ref 3.6–5.1)
Alkaline phosphatase (APISO): 89 U/L (ref 35–144)
BUN: 14 mg/dL (ref 7–25)
CO2: 28 mmol/L (ref 20–32)
Calcium: 9.2 mg/dL (ref 8.6–10.3)
Chloride: 102 mmol/L (ref 98–110)
Creat: 0.87 mg/dL (ref 0.70–1.35)
Globulin: 2.7 g/dL (calc) (ref 1.9–3.7)
Glucose, Bld: 87 mg/dL (ref 65–99)
Potassium: 4.4 mmol/L (ref 3.5–5.3)
Sodium: 139 mmol/L (ref 135–146)
Total Bilirubin: 0.5 mg/dL (ref 0.2–1.2)
Total Protein: 6.8 g/dL (ref 6.1–8.1)
eGFR: 98 mL/min/{1.73_m2} (ref >=60)

## 2022-02-16 ENCOUNTER — Other Ambulatory Visit (HOSPITAL_COMMUNITY): Payer: Self-pay

## 2022-02-20 ENCOUNTER — Other Ambulatory Visit (HOSPITAL_COMMUNITY): Payer: Self-pay

## 2022-02-22 ENCOUNTER — Other Ambulatory Visit (HOSPITAL_COMMUNITY): Payer: Self-pay

## 2022-02-23 ENCOUNTER — Other Ambulatory Visit (HOSPITAL_COMMUNITY): Payer: Self-pay

## 2022-03-09 DIAGNOSIS — Z029 Encounter for administrative examinations, unspecified: Secondary | ICD-10-CM

## 2022-03-13 ENCOUNTER — Inpatient Hospital Stay (INDEPENDENT_AMBULATORY_CARE_PROVIDER_SITE_OTHER): Payer: BC Managed Care – PPO

## 2022-03-13 ENCOUNTER — Encounter: Payer: Self-pay | Admitting: Family Medicine

## 2022-03-13 ENCOUNTER — Ambulatory Visit (INDEPENDENT_AMBULATORY_CARE_PROVIDER_SITE_OTHER): Payer: BC Managed Care – PPO | Admitting: Family Medicine

## 2022-03-13 VITALS — Temp 97.5°F | Ht 66.0 in | Wt 189.0 lb

## 2022-03-13 DIAGNOSIS — R002 Palpitations: Secondary | ICD-10-CM

## 2022-03-13 DIAGNOSIS — R42 Dizziness and giddiness: Secondary | ICD-10-CM

## 2022-03-13 DIAGNOSIS — R001 Bradycardia, unspecified: Secondary | ICD-10-CM | POA: Diagnosis not present

## 2022-03-13 NOTE — Progress Notes (Signed)
Acute Office Visit  Subjective:     Patient ID: Daniel Griffin, male    DOB: 1960/12/06, 61 y.o.   MRN: [id number]  Chief Complaint  Patient presents with   Dizziness   Fatigue    Dizziness   Patient is in today for episodes of dizziness. He reports episodes of nausea, dizziness, fatigue, general weakness, palpitations, and sweating that lasts for about 10 minutes. This reports has happened about 4 times in the last few months. This most recently occurred over the weekend. He denies triggering or relieving factors. Denies fever or chest pain. Denies edema.   ROS As per HPI.      Objective:    Temp (!) 97.5 F (36.4 C) (Temporal)   Ht '5\' 6"'$  (1.676 m)   Wt 189 lb (85.7 kg)   SpO2 95%   BMI 30.51 kg/m   Orthostatic VS for the past 72 hrs (Last 3 readings):  Orthostatic BP Patient Position Orthostatic Pulse  03/13/22 1140 133/75 Standing 61  03/13/22 1139 135/79 Sitting 58  03/13/22 1137 121/69 Supine 55    Physical Exam Vitals and nursing note reviewed.  Constitutional:      General: He is not in acute distress.    Appearance: He is not ill-appearing, toxic-appearing or diaphoretic.  HENT:     Head: Normocephalic and atraumatic.     Mouth/Throat:     Mouth: Mucous membranes are moist.     Pharynx: Oropharynx is clear.  Eyes:     Extraocular Movements: Extraocular movements intact.     Pupils: Pupils are equal, round, and reactive to light.  Neck:     Thyroid: No thyroid mass, thyromegaly or thyroid tenderness.     Vascular: No carotid bruit or JVD.  Cardiovascular:     Rate and Rhythm: Normal rate and regular rhythm.     Heart sounds: Normal heart sounds. No murmur heard. Pulmonary:     Effort: Pulmonary effort is normal. No respiratory distress.     Breath sounds: Normal breath sounds.  Abdominal:     General: Bowel sounds are normal. There is no distension.     Palpations: Abdomen is soft.     Tenderness: There is no abdominal tenderness. There is  no guarding or rebound.  Musculoskeletal:     Right lower leg: No edema.     Left lower leg: No edema.  Skin:    General: Skin is warm and dry.  Neurological:     General: No focal deficit present.     Mental Status: He is alert and oriented to person, place, and time.  Psychiatric:        Mood and Affect: Mood normal.        Behavior: Behavior normal.     No results found for any visits on 03/13/22.      Assessment & Plan:   Favian was seen today for dizziness and fatigue.  Diagnoses and all orders for this visit:  Palpitations -     EKG 12-Lead -     LONG TERM MONITOR (3-14 DAYS); Future -     Ambulatory referral to Cardiology  Bradycardia -     LONG TERM MONITOR (3-14 DAYS); Future -     Ambulatory referral to Cardiology  Dizziness -     EKG 12-Lead -     LONG TERM MONITOR (3-14 DAYS); Future -     Ambulatory referral to Cardiology   Reporting episodes of palpitations with dizziness,shortness of breath, sweating,  and generalized weakness. EKG with bradycardia today and some flattening of the R wave. Zio monitor applied today to assess for arrhthymias. Stat cardiology referral placed for symptomatic palpitations and bradycardia. If he does not have an appt with cardiology within the next week, will order stat echo. Instructed to call EMS if he has another episode.   The patient indicates understanding of these issues and agrees with the plan.  Gwenlyn Perking, FNP

## 2022-03-15 ENCOUNTER — Other Ambulatory Visit (HOSPITAL_COMMUNITY): Payer: Self-pay

## 2022-03-15 ENCOUNTER — Other Ambulatory Visit: Payer: Self-pay | Admitting: Family Medicine

## 2022-03-15 DIAGNOSIS — R42 Dizziness and giddiness: Secondary | ICD-10-CM

## 2022-03-15 DIAGNOSIS — R001 Bradycardia, unspecified: Secondary | ICD-10-CM

## 2022-03-15 DIAGNOSIS — R002 Palpitations: Secondary | ICD-10-CM

## 2022-03-21 ENCOUNTER — Other Ambulatory Visit: Payer: Self-pay | Admitting: Physician Assistant

## 2022-03-21 ENCOUNTER — Other Ambulatory Visit (HOSPITAL_COMMUNITY): Payer: Self-pay

## 2022-03-21 MED ORDER — ORENCIA CLICKJECT 125 MG/ML ~~LOC~~ SOAJ
125.0000 mg | SUBCUTANEOUS | 0 refills | Status: DC
  Filled 2022-03-21: qty 4, 28d supply, fill #0
  Filled 2022-04-12: qty 4, 28d supply, fill #1
  Filled 2022-05-11: qty 4, 28d supply, fill #2

## 2022-03-21 NOTE — Telephone Encounter (Signed)
Next Visit: Due November 2023. Message sent to the front to schedule.   Last Visit: 02/09/2022  Last Fill: 12/20/2021  DX: Rheumatoid arthritis involving multiple sites with positive rheumatoid factor   Current Dose per office note 02/09/2022: Orencia 125 mg sq injections once weekly  Labs: 02/09/2022 CBC and CMP normal.  TB Gold: 09/12/2021 Neg    Okay to refill Orencia?

## 2022-03-21 NOTE — Telephone Encounter (Signed)
Please schedule patient a follow up visit. Patient due November 2023. Thanks!  

## 2022-03-22 ENCOUNTER — Telehealth: Payer: Self-pay | Admitting: Nurse Practitioner

## 2022-03-22 NOTE — Telephone Encounter (Signed)
Melissa called from [facility] to let PCP know that pts [facility] needs authorization per pts insurance.   Pt scheduled to have [facility] on 03/24/22.  Can call Melissa with any questions.  [phone] ext [phone]

## 2022-03-23 ENCOUNTER — Other Ambulatory Visit (HOSPITAL_COMMUNITY): Payer: Self-pay

## 2022-03-23 NOTE — Telephone Encounter (Signed)
Loma Sousa is off today 8/3 - I sent the message to the [facility] STAT chat and I am waiting on them to see if they can help [place] on this today.

## 2022-03-24 ENCOUNTER — Ambulatory Visit (HOSPITAL_COMMUNITY)
Admission: RE | Admit: 2022-03-24 | Discharge: 2022-03-24 | Disposition: A | Discharge status: Home / Self Care | Payer: BC Managed Care – PPO | Source: Ambulatory Visit | Attending: Family Medicine | Admitting: Family Medicine

## 2022-03-24 DIAGNOSIS — I517 Cardiomegaly: Secondary | ICD-10-CM | POA: Insufficient documentation

## 2022-03-24 DIAGNOSIS — R002 Palpitations: Secondary | ICD-10-CM | POA: Insufficient documentation

## 2022-03-24 DIAGNOSIS — R42 Dizziness and giddiness: Secondary | ICD-10-CM | POA: Diagnosis not present

## 2022-03-24 DIAGNOSIS — R001 Bradycardia, unspecified: Secondary | ICD-10-CM | POA: Insufficient documentation

## 2022-03-24 LAB — ECHOCARDIOGRAM COMPLETE
AR max vel: 3.36 cm2
AV Area VTI: 2.87 cm2
AV Area mean vel: 2.84 cm2
AV Mean grad: 2 mmHg
AV Peak grad: 3.8 mmHg
Ao pk vel: 0.97 m/s
Area-P 1/2: 2.9 cm2
S' Lateral: 3.3 cm

## 2022-03-24 NOTE — Progress Notes (Signed)
  Echocardiogram 2D Echocardiogram has been performed.  Daniel Griffin 03/24/2022, 3:33 PM

## 2022-04-03 DIAGNOSIS — R002 Palpitations: Secondary | ICD-10-CM | POA: Diagnosis not present

## 2022-04-03 DIAGNOSIS — R001 Bradycardia, unspecified: Secondary | ICD-10-CM | POA: Diagnosis not present

## 2022-04-03 DIAGNOSIS — R42 Dizziness and giddiness: Secondary | ICD-10-CM | POA: Diagnosis not present

## 2022-04-04 ENCOUNTER — Encounter: Payer: Self-pay | Admitting: Internal Medicine

## 2022-04-04 ENCOUNTER — Ambulatory Visit (INDEPENDENT_AMBULATORY_CARE_PROVIDER_SITE_OTHER): Payer: BC Managed Care – PPO | Admitting: Internal Medicine

## 2022-04-04 VITALS — BP 120/72 | HR 53 | Ht 67.0 in | Wt 191.8 lb

## 2022-04-04 DIAGNOSIS — R42 Dizziness and giddiness: Secondary | ICD-10-CM

## 2022-04-04 NOTE — Patient Instructions (Signed)
Medication Instructions:  No Changes In Medications at this time.  *If you need a refill on your cardiac medications before your next appointment, please call your pharmacy*  Lab Work: None Ordered At This Time.  If you have labs (blood work) drawn today and your tests are completely normal, you will receive your results only by: [place] (if you have MyChart) OR A paper copy in the mail If you have any lab test that is abnormal or we need to change your treatment, we will call you to review the results.  Testing/Procedures: None Ordered At This Time.   Follow-Up: At [facility], you and your health needs are our priority.  As part of our continuing mission to provide you with exceptional heart care, we have created designated Provider Care Teams.  These Care Teams include your primary Cardiologist (physician) and Advanced Practice Providers (APPs -  Physician Assistants and Nurse Practitioners) who all work together to provide you with the care you need, when you need it.  We recommend signing up for the patient portal called "MyChart".  Sign up information is provided on this After Visit Summary.  MyChart is used to connect with patients for Virtual Visits (Telemedicine).  Patients are able to view lab/test results, encounter notes, upcoming appointments, etc.  Non-urgent messages can be sent to your provider as well.   To learn more about what you can do with MyChart, go to [email or web address].    Your next appointment:   AS NEEDED   The format for your next appointment:   In Person  Provider:   Janina Mayo, MD

## 2022-04-04 NOTE — Progress Notes (Addendum)
Cardiology Office Note:    Date:  04/04/2022   ID:  Daniel Griffin, DOB 17-Apr-1961, MRN [id number]  PCP:  Daniel Griffin, [place] Providers Cardiologist:  Daniel Mayo, MD     Referring MD: Daniel Perking, FNP   No chief complaint on file. Dizziness  History of Present Illness:    Daniel Griffin is a 61 y.o. male with a hx of brain aneurysm, dementia, RA, etoh abuse, referral from Daniel Griffin for dizziness, bradycardia. His daughter was giving the hx. He looked pale and diaphoretic a few weeks ago. He's had a few episodes within a month. He feels intermittently weak and diaphoresis.  It just resolves on its own.  Thinks he has been eating well. Unknown if well hydrated. He stays hydrated. He has not had any episodes. When seen by his primary provider, his EKG showed sinus bradycardia 54 bpm, 1st degree AV block  Blood pressures is well controlled  Cardiology studies: TTE: EF appears better than 45-50. No WMA. RV mildly enlarged, normal function Zio 03/13/2022: no triggered events. 14 days. Sinus rhythm. Mild ectopy (PAC/PVC). Sinus brady at 1pm to 40; otherwise predominantly sinus rhythm with appropriate rate response  Past Medical History:  Diagnosis Date   Arthritis    RA   Depression    Seizures ([place]) 06/21/2020   ETOH w/d related    Past Surgical History:  Procedure Laterality Date   ANKLE FRACTURE SURGERY     Left   APPENDECTOMY     IR 3D INDEPENDENT WKST  09/01/2021   IR 3D INDEPENDENT WKST  12/14/2021   IR ANGIO INTRA EXTRACRAN SEL INTERNAL CAROTID BILAT MOD SED  09/01/2021   IR ANGIO INTRA EXTRACRAN SEL INTERNAL CAROTID UNI R MOD SED  12/14/2021   IR ANGIO VERTEBRAL SEL VERTEBRAL BILAT MOD SED  09/01/2021   IR ANGIOGRAM FOLLOW UP STUDY  12/14/2021   IR ANGIOGRAM FOLLOW UP STUDY  12/14/2021   IR ANGIOGRAM FOLLOW UP STUDY  12/14/2021   IR CT HEAD LTD  12/14/2021   IR TRANSCATH/EMBOLIZ  12/14/2021   IR US GUIDE VASC ACCESS RIGHT   09/01/2021   IR US GUIDE VASC ACCESS RIGHT  12/14/2021   RADIOLOGY WITH ANESTHESIA N/A 12/14/2021   Procedure: IR WITH ANESTHESIA EMBOLIZATION;  Surgeon: Pedro Earls, MD;  Location: [facility];  Service: Radiology;  Laterality: N/A;   REVERSE SHOULDER ARTHROPLASTY Left 05/13/2021   Procedure: REVERSE SHOULDER ARTHROPLASTY;  Surgeon: Netta Cedars, MD;  Location: [facility];  Service: Orthopedics;  Laterality: Left;  with ISB   REVERSE SHOULDER ARTHROPLASTY Right 10/14/2021   Procedure: REVERSE SHOULDER ARTHROPLASTY;  Surgeon: Netta Cedars, MD;  Location: [facility];  Service: Orthopedics;  Laterality: Right;  with ISB    Current Medications: Current Meds  Medication Sig   Abatacept (ORENCIA CLICKJECT) 244 MG/ML SOAJ INJECT 125 MG INTO THE SKIN ONCE A WEEK.   buPROPion (WELLBUTRIN XL) 150 MG 24 hr tablet Take 1 tablet (150 mg total) by mouth daily.   escitalopram (LEXAPRO) 10 MG tablet Take 1 tablet (10 mg total) by mouth daily.     Allergies:   Patient has no known allergies.   Social History   Socioeconomic History   Marital status: Single    Spouse name: Not on file   Number of children: Not on file   Years of education: Not on file   Highest education level: Not on file  Occupational History   Occupation:  not worked since seizure 06/21/20  Tobacco Use   Smoking status: Never   Smokeless tobacco: Current    Types: Snuff, Chew  Vaping Use   Vaping Use: Never used  Substance and Sexual Activity   Alcohol use: Not Currently    Comment: Quit in 06/2020   Drug use: Yes    Frequency: 7.0 times per week    Types: Marijuana    Comment: occ   Sexual activity: Not on file  Other Topics Concern   Not on file  Social History Narrative   Lives alone   Right Handed   Rarely drinks caffeine       Social Determinants of Health   Financial Resource Strain: Not on file  Food Insecurity: Not on file  Transportation Needs: Not on file  Physical Activity: Not on file  Stress:  Not on file  Social Connections: Not on file     Family History: The patient's family history includes Alzheimer's disease in his father; Healthy in his daughter, daughter, and son; Rheum arthritis in his daughter.  ROS:   Please see the history of present illness.     All other systems reviewed and are negative.  EKGs/Labs/Other Studies Reviewed:    The following studies were reviewed today:   EKG:  EKG is  ordered today.  The ekg ordered today demonstrates   04/04/2022-Sinus bradycardia with 1st degree , LAD  Recent Labs: 02/09/2022: ALT 16; BUN 14; Creat 0.87; Hemoglobin 13.7; Platelets 291; Potassium 4.4; Sodium 139  Recent Lipid Panel    Component Value Date/Time   CHOL 139 06/22/2020 0603   TRIG 49 06/22/2020 0603   HDL 75 06/22/2020 0603   CHOLHDL 1.9 06/22/2020 0603   VLDL 10 06/22/2020 0603   LDLCALC 54 06/22/2020 0603     Risk Assessment/Calculations:           Physical Exam:    VS:   Vitals:   04/04/22 0841  BP: 120/72  Pulse: (!) 53  SpO2: 96%     Wt Readings from Last 3 Encounters:  04/04/22 191 lb 12.8 oz (87 kg)  03/13/22 189 lb (85.7 kg)  02/09/22 192 lb (87.1 kg)     GEN: was not speaking. Well nourished, well developed in no acute distress HEENT: Normal NECK: No JVD; No carotid bruits LYMPHATICS: No lymphadenopathy CARDIAC: RRR, no murmurs, rubs, gallops RESPIRATORY:  Clear to auscultation without rales, wheezing or rhonchi  ABDOMEN: Soft, non-tender, non-distended MUSCULOSKELETAL:  No edema; No deformity  SKIN: Warm and dry NEUROLOGIC:  Alert and oriented x 3 PSYCHIATRIC:  Normal affect   ASSESSMENT:    Dizziness/Bradycardia: Ddx includes coronary dx, his echo shows normal wall motion. No ischemic changes on his EKG. Episodes are infrequent. His bradycardia is mild and a PPM is not indicated.  Unlikely related to stroke, no significant stenosis in the carotids or vertebral arteries. No cardiac causes have been identified. His  daughter plans to take him to the ED if these episode recur PLAN:    In order of problems listed above:  No further cardiac w/u is indicated at this time      Medication Adjustments/Labs and Tests Ordered: Current medicines are reviewed at length with the patient today.  Concerns regarding medicines are outlined above.  Orders Placed This Encounter  Procedures   EKG 12-Lead   No orders of the defined types were placed in this encounter.   Patient Instructions  Medication Instructions:  No Changes In Medications at this  time.  *If you need a refill on your cardiac medications before your next appointment, please call your pharmacy*  Lab Work: None Ordered At This Time.  If you have labs (blood work) drawn today and your tests are completely normal, you will receive your results only by: [place] (if you have MyChart) OR A paper copy in the mail If you have any lab test that is abnormal or we need to change your treatment, we will call you to review the results.  Testing/Procedures: None Ordered At This Time.   Follow-Up: At [facility], you and your health needs are our priority.  As part of our continuing mission to provide you with exceptional heart care, we have created designated Provider Care Teams.  These Care Teams include your primary Cardiologist (physician) and Advanced Practice Providers (APPs -  Physician Assistants and Nurse Practitioners) who all work together to provide you with the care you need, when you need it.  We recommend signing up for the patient portal called "MyChart".  Sign up information is provided on this After Visit Summary.  MyChart is used to connect with patients for Virtual Visits (Telemedicine).  Patients are able to view lab/test results, encounter notes, upcoming appointments, etc.  Non-urgent messages can be sent to your provider as well.   To learn more about what you can do with MyChart, go to [email or web address].    Your  next appointment:   AS NEEDED   The format for your next appointment:   In Person  Provider:   Janina Mayo, MD           Signed, Daniel Mayo, MD  04/04/2022 9:06 AM    [place]

## 2022-04-12 ENCOUNTER — Other Ambulatory Visit (HOSPITAL_COMMUNITY): Payer: Self-pay

## 2022-04-17 ENCOUNTER — Other Ambulatory Visit: Payer: Self-pay | Admitting: *Deleted

## 2022-04-17 DIAGNOSIS — Z79899 Other long term (current) drug therapy: Secondary | ICD-10-CM | POA: Diagnosis not present

## 2022-04-18 LAB — COMPLETE METABOLIC PANEL WITH GFR
AG Ratio: 1.4 (calc) (ref 1.0–2.5)
ALT: 11 U/L (ref 9–46)
AST: 12 U/L (ref 10–35)
Albumin: 3.7 g/dL (ref 3.6–5.1)
Alkaline phosphatase (APISO): 78 U/L (ref 35–144)
BUN: 10 mg/dL (ref 7–25)
CO2: 27 mmol/L (ref 20–32)
Calcium: 8.8 mg/dL (ref 8.6–10.3)
Chloride: 102 mmol/L (ref 98–110)
Creat: 0.85 mg/dL (ref 0.70–1.35)
Globulin: 2.7 g/dL (calc) (ref 1.9–3.7)
Glucose, Bld: 98 mg/dL (ref 65–99)
Potassium: 4 mmol/L (ref 3.5–5.3)
Sodium: 139 mmol/L (ref 135–146)
Total Bilirubin: 0.5 mg/dL (ref 0.2–1.2)
Total Protein: 6.4 g/dL (ref 6.1–8.1)
eGFR: 99 mL/min/{1.73_m2} (ref >=60)

## 2022-04-18 LAB — CBC WITH DIFFERENTIAL/PLATELET
Absolute Monocytes: 696 cells/uL (ref 200–950)
Basophils Absolute: 43 cells/uL (ref 0–200)
Basophils Relative: 0.6 %
Eosinophils Absolute: 92 cells/uL (ref 15–500)
Eosinophils Relative: 1.3 %
HCT: 36.8 % — ABNORMAL LOW (ref 38.5–50.0)
Hemoglobin: 12.4 g/dL — ABNORMAL LOW (ref 13.2–17.1)
Lymphs Abs: 1534 cells/uL (ref 850–3900)
MCH: 30.3 pg (ref 27.0–33.0)
MCHC: 33.7 g/dL (ref 32.0–36.0)
MCV: 90 fL (ref 80.0–100.0)
MPV: 10.2 fL (ref 7.5–12.5)
Monocytes Relative: 9.8 %
Neutro Abs: 4736 cells/uL (ref 1500–7800)
Neutrophils Relative %: 66.7 %
Platelets: 273 10*3/uL (ref 140–400)
RBC: 4.09 10*6/uL — ABNORMAL LOW (ref 4.20–5.80)
RDW: 12.8 % (ref 11.0–15.0)
Total Lymphocyte: 21.6 %
WBC: 7.1 10*3/uL (ref 3.8–10.8)

## 2022-04-19 ENCOUNTER — Other Ambulatory Visit (HOSPITAL_COMMUNITY): Payer: Self-pay

## 2022-04-20 ENCOUNTER — Other Ambulatory Visit (HOSPITAL_COMMUNITY): Payer: Self-pay

## 2022-05-11 ENCOUNTER — Other Ambulatory Visit (HOSPITAL_COMMUNITY): Payer: Self-pay

## 2022-05-15 ENCOUNTER — Other Ambulatory Visit (HOSPITAL_COMMUNITY): Payer: Self-pay

## 2022-05-31 ENCOUNTER — Encounter (HOSPITAL_COMMUNITY): Payer: Self-pay | Admitting: Emergency Medicine

## 2022-05-31 ENCOUNTER — Emergency Department (HOSPITAL_COMMUNITY)
Admission: EM | Admit: 2022-05-31 | Discharge: 2022-05-31 | Disposition: A | Discharge status: Home / Self Care | Payer: BC Managed Care – PPO | Attending: Emergency Medicine | Admitting: Emergency Medicine

## 2022-05-31 ENCOUNTER — Other Ambulatory Visit: Payer: Self-pay

## 2022-05-31 ENCOUNTER — Emergency Department (HOSPITAL_COMMUNITY): Payer: BC Managed Care – PPO

## 2022-05-31 DIAGNOSIS — R410 Disorientation, unspecified: Secondary | ICD-10-CM | POA: Diagnosis not present

## 2022-05-31 DIAGNOSIS — R0602 Shortness of breath: Secondary | ICD-10-CM | POA: Insufficient documentation

## 2022-05-31 DIAGNOSIS — R531 Weakness: Secondary | ICD-10-CM | POA: Diagnosis not present

## 2022-05-31 DIAGNOSIS — R52 Pain, unspecified: Secondary | ICD-10-CM | POA: Diagnosis not present

## 2022-05-31 DIAGNOSIS — R110 Nausea: Secondary | ICD-10-CM | POA: Insufficient documentation

## 2022-05-31 DIAGNOSIS — R61 Generalized hyperhidrosis: Secondary | ICD-10-CM | POA: Insufficient documentation

## 2022-05-31 DIAGNOSIS — R0789 Other chest pain: Secondary | ICD-10-CM | POA: Insufficient documentation

## 2022-05-31 DIAGNOSIS — R0689 Other abnormalities of breathing: Secondary | ICD-10-CM | POA: Diagnosis not present

## 2022-05-31 LAB — CBC WITH DIFFERENTIAL/PLATELET
Abs Immature Granulocytes: 0.09 10*3/uL — ABNORMAL HIGH (ref 0.00–0.07)
Basophils Absolute: 0.1 10*3/uL (ref 0.0–0.1)
Basophils Relative: 1 %
Eosinophils Absolute: 0 10*3/uL (ref 0.0–0.5)
Eosinophils Relative: 0 %
HCT: 44 % (ref 39.0–52.0)
Hemoglobin: 14.4 g/dL (ref 13.0–17.0)
Immature Granulocytes: 1 %
Lymphocytes Relative: 13 %
Lymphs Abs: 1.3 10*3/uL (ref 0.7–4.0)
MCH: 30 pg (ref 26.0–34.0)
MCHC: 32.7 g/dL (ref 30.0–36.0)
MCV: 91.7 fL (ref 80.0–100.0)
Monocytes Absolute: 0.7 10*3/uL (ref 0.1–1.0)
Monocytes Relative: 6 %
Neutro Abs: 8.4 10*3/uL — ABNORMAL HIGH (ref 1.7–7.7)
Neutrophils Relative %: 79 %
Platelets: ADEQUATE 10*3/uL (ref 150–400)
RBC: 4.8 MIL/uL (ref 4.22–5.81)
RDW: 13.4 % (ref 11.5–15.5)
Smear Review: UNDETERMINED
WBC: 10.5 10*3/uL (ref 4.0–10.5)
nRBC: 0 % (ref 0.0–0.2)

## 2022-05-31 LAB — BASIC METABOLIC PANEL
Anion gap: 12 (ref 5–15)
BUN: 13 mg/dL (ref 8–23)
CO2: 25 mmol/L (ref 22–32)
Calcium: 9.1 mg/dL (ref 8.9–10.3)
Chloride: 102 mmol/L (ref 98–111)
Creatinine, Ser: 0.8 mg/dL (ref 0.61–1.24)
GFR, Estimated: >60 mL/min (ref >60)
Glucose, Bld: 103 mg/dL — ABNORMAL HIGH (ref 70–99)
Potassium: 4.2 mmol/L (ref 3.5–5.1)
Sodium: 139 mmol/L (ref 135–145)

## 2022-05-31 LAB — TROPONIN I (HIGH SENSITIVITY)
Troponin I (High Sensitivity): 2 ng/L (ref <18)
Troponin I (High Sensitivity): 2 ng/L (ref <18)

## 2022-05-31 LAB — BRAIN NATRIURETIC PEPTIDE: B Natriuretic Peptide: 159 pg/mL — ABNORMAL HIGH (ref 0.0–100.0)

## 2022-05-31 NOTE — Discharge Instructions (Signed)
The testing done in the emergency department today is reassuring.  Your episodes may be related to panic attacks.  Talk to primary care doctor about this.  Return to the emergency department at any time for any new or worsening symptoms of concern.

## 2022-05-31 NOTE — ED Provider Notes (Signed)
[name] EMERGENCY DEPARTMENT Provider Note   CSN: [id number] Arrival date & time: 05/31/22  1846     History  Chief Complaint  Patient presents with   Shortness of Breath    Daniel Griffin is a 61 y.o. male.   Shortness of Breath Associated symptoms: diaphoresis   Patient presents for episode of diaphoresis and chest tightness.  Medical history includes rheumatoid arthritis, syphilis, dementia, depression.  He has a history of alcohol abuse.  He states that he had daily heavy alcohol consumption for many years.  He has abstained from alcohol for the past 2 years.  Over the past 2 years, he has had intermittent episodes similar to what he experienced today.  He describes these episodes as sudden onset, diaphoresis, nausea, chest tightness.  He denies associated chest pain or dizziness.  Episodes typically last for 10 minutes.  Today's episode lasted for an estimated 25 minutes.  At the time of onset today, he was feeding his grandson and not exerting himself.  Following resolution of symptoms, patient has been asymptomatic.  Episode today occurred at approximately 5 PM.  Following prior episodes, he did follow-up with cardiology.  He did wear a cardiac monitor for a week and did not have any abnormal cardiac rhythms.     Home Medications Prior to Admission medications   Medication Sig Start Date End Date Taking? Authorizing Provider  Abatacept (ORENCIA CLICKJECT) 267 MG/ML SOAJ INJECT 125 MG INTO THE SKIN ONCE A WEEK. 03/21/22   Ofilia Neas, PA-C  buPROPion (WELLBUTRIN XL) 150 MG 24 hr tablet Take 1 tablet (150 mg total) by mouth daily. 01/31/22   Hassell Done, Mary-Margaret, FNP  escitalopram (LEXAPRO) 10 MG tablet Take 1 tablet (10 mg total) by mouth daily. 01/31/22   Chevis Pretty, FNP      Allergies    Patient has no known allergies.    Review of Systems   Review of Systems  Constitutional:  Positive for diaphoresis.  Respiratory:  Positive for chest tightness and  shortness of breath.   Gastrointestinal:  Positive for nausea.  All other systems reviewed and are negative.   Physical Exam Updated Vital Signs BP 127/75   Pulse (!) 58   Temp 97.9 F (36.6 C) (Oral)   Resp 17   Ht '5\' 7"'$  (1.702 m)   Wt 90.7 kg   SpO2 94%   BMI 31.32 kg/m  Physical Exam Vitals and nursing note reviewed.  Constitutional:      General: He is not in acute distress.    Appearance: He is well-developed. He is not ill-appearing, toxic-appearing or diaphoretic.  HENT:     Head: Normocephalic and atraumatic.     Mouth/Throat:     Mouth: Mucous membranes are moist.     Pharynx: Oropharynx is clear.  Eyes:     Conjunctiva/sclera: Conjunctivae normal.  Cardiovascular:     Rate and Rhythm: Normal rate and regular rhythm.     Heart sounds: No murmur heard. Pulmonary:     Effort: Pulmonary effort is normal. No tachypnea or respiratory distress.     Breath sounds: Normal breath sounds. No decreased breath sounds, wheezing, rhonchi or rales.  Chest:     Chest wall: No tenderness.  Abdominal:     Palpations: Abdomen is soft.     Tenderness: There is no abdominal tenderness.  Musculoskeletal:        General: No swelling. Normal range of motion.     Cervical back: Normal range of motion  and neck supple.     Right lower leg: No edema.     Left lower leg: No edema.  Skin:    General: Skin is warm and dry.     Capillary Refill: Capillary refill takes less than 2 seconds.     Coloration: Skin is not cyanotic or pale.  Neurological:     General: No focal deficit present.     Mental Status: He is alert and oriented to person, place, and time.  Psychiatric:        Mood and Affect: Mood normal.        Behavior: Behavior normal.     ED Results / Procedures / Treatments   Labs (all labs ordered are listed, but only abnormal results are displayed) Labs Reviewed  CBC WITH DIFFERENTIAL/PLATELET - Abnormal; Notable for the following components:      Result Value    Neutro Abs 8.4 (*)    Abs Immature Granulocytes 0.09 (*)    All other components within normal limits  BASIC METABOLIC PANEL - Abnormal; Notable for the following components:   Glucose, Bld 103 (*)    All other components within normal limits  BRAIN NATRIURETIC PEPTIDE - Abnormal; Notable for the following components:   B Natriuretic Peptide 159.0 (*)    All other components within normal limits  TROPONIN I (HIGH SENSITIVITY)  TROPONIN I (HIGH SENSITIVITY)    EKG EKG Interpretation  Date/Time:  Wednesday May 31 2022 19:01:45 EDT Ventricular Rate:  62 PR Interval:  196 QRS Duration: 84 QT Interval:  412 QTC Calculation: 418 R Axis:   -43 Text Interpretation: Normal sinus rhythm Left axis deviation Confirmed by Godfrey Pick ([id number]) on 05/31/2022 8:15:17 PM  Radiology DG Chest 2 View  Result Date: 05/31/2022 CLINICAL DATA:  Shortness of breath, diaphoresis EXAM: CHEST - 2 VIEW COMPARISON:  09/14/2021 FINDINGS: Cardiac and mediastinal contours are within normal limits. No focal pulmonary opacity. No pleural effusion or pneumothorax. No acute osseous abnormality. Status post bilateral shoulder arthroplasty. IMPRESSION: No acute cardiopulmonary process. Electronically Signed   By: Merilyn Baba M.D.   On: 05/31/2022 19:26    Procedures Procedures    Medications Ordered in ED Medications - No data to display  ED Course/ Medical Decision Making/ A&P                           Medical Decision Making Amount and/or Complexity of Data Reviewed Radiology: ordered.   Patient is a 61 year old male who presents following an episode that occurred earlier today at approximately 5 PM.  He describes the episode as follows: Sudden onset diaphoresis, chest tightness, and nausea.  He has had intermittent similar episodes over the past 2 years.  This does coincide with him discontinuing his alcohol use.  Although episodes typically last for 10 minutes, today's episode was longer in duration.   He estimates that it lasted for 25 minutes.  He was seen by cardiology following a similar episode.  He underwent TTE and Zio patch.  2 weeks of Zio patch data showed sinus rhythm with mild ectopy.  Echocardiogram showed normal function.  He was last seen by cardiology 2 months ago.  He was advised to follow-up only as needed.  Patient's episode today was transient.  He has since been asymptomatic.  He is well-appearing on arrival in the ED.  EKG shows normal sinus rhythm.  Patient was kept on bedside cardiac monitor.  Laboratory work-up is reassuring.  He has no electrolyte abnormalities and troponin is normal.  He remained asymptomatic while in the ED.  Patient's symptoms are suggestive of panic attack.  He was advised to follow-up with his primary care doctor.  He was encouraged to return to the ED at any time for any further concerning symptoms.  He was discharged in good condition.        Final Clinical Impression(s) / ED Diagnoses Final diagnoses:  Diaphoresis  Nausea    Rx / DC Orders ED Discharge Orders     None         Godfrey Pick, MD 06/01/22 0126

## 2022-05-31 NOTE — ED Triage Notes (Signed)
Sudden onset sob & diaphoresis off & on x 2 years. Denies CP, cough, fevers.97% on RA on ems arrival. BG 95. NAD during triage.

## 2022-06-01 ENCOUNTER — Other Ambulatory Visit (HOSPITAL_COMMUNITY): Payer: Self-pay | Admitting: Neuroradiology

## 2022-06-01 ENCOUNTER — Telehealth (HOSPITAL_COMMUNITY): Payer: Self-pay

## 2022-06-01 DIAGNOSIS — I671 Cerebral aneurysm, nonruptured: Secondary | ICD-10-CM

## 2022-06-01 NOTE — Telephone Encounter (Signed)
Called to schedule diagnostic angiogram, no answer, left vm. AW  

## 2022-06-06 ENCOUNTER — Other Ambulatory Visit (HOSPITAL_COMMUNITY): Payer: Self-pay

## 2022-06-06 DIAGNOSIS — M170 Bilateral primary osteoarthritis of knee: Secondary | ICD-10-CM | POA: Diagnosis not present

## 2022-06-06 DIAGNOSIS — M25561 Pain in right knee: Secondary | ICD-10-CM | POA: Diagnosis not present

## 2022-06-06 DIAGNOSIS — M25562 Pain in left knee: Secondary | ICD-10-CM | POA: Diagnosis not present

## 2022-06-08 ENCOUNTER — Other Ambulatory Visit (HOSPITAL_COMMUNITY): Payer: Self-pay

## 2022-06-08 ENCOUNTER — Other Ambulatory Visit: Payer: Self-pay | Admitting: Physician Assistant

## 2022-06-08 MED ORDER — ORENCIA CLICKJECT 125 MG/ML ~~LOC~~ SOAJ
125.0000 mg | SUBCUTANEOUS | 0 refills | Status: DC
  Filled 2022-06-08: qty 4, 28d supply, fill #0
  Filled 2022-08-02: qty 4, 28d supply, fill #1
  Filled 2022-08-28: qty 4, 28d supply, fill #2

## 2022-06-08 NOTE — Telephone Encounter (Signed)
Please schedule patient a follow up visit. Patient due November 2023. Thanks! Follow-Up Instructions: Return in about 5 months (around 07/12/2022) for Rheumatoid arthritis, Osteoarthritis.

## 2022-06-08 NOTE — Telephone Encounter (Signed)
Voicemail full / unable to leave a message

## 2022-06-08 NOTE — Telephone Encounter (Signed)
Next Visit: Due November 2023. Message sent to the front to schedule.    Last Visit: 02/09/2022   Last Fill: 12/20/2021   DX: Rheumatoid arthritis involving multiple sites with positive rheumatoid factor    Current Dose per office note 02/09/2022: Orencia 125 mg sq injections once weekly   Labs: 05/31/2022 Neutro Abs 8.4, Abs Immature Granulocytes 0.09, Glucose 103   TB Gold: 09/12/2021 Neg     Okay to refill Orencia?

## 2022-06-13 DIAGNOSIS — M170 Bilateral primary osteoarthritis of knee: Secondary | ICD-10-CM | POA: Diagnosis not present

## 2022-06-20 DIAGNOSIS — M170 Bilateral primary osteoarthritis of knee: Secondary | ICD-10-CM | POA: Diagnosis not present

## 2022-06-21 ENCOUNTER — Other Ambulatory Visit (HOSPITAL_COMMUNITY): Payer: Self-pay

## 2022-06-22 NOTE — Progress Notes (Signed)
Office Visit Note  Patient: Daniel Griffin             Date of Birth: Aug 17, 1961           MRN: [id number]             PCP: Chevis Pretty, FNP Referring: Chevis Pretty, * Visit Date: 07/06/2022 Occupation: '@GUAROCC'$ @  Subjective:  Right shoulder discomfort  History of Present Illness: Daniel Griffin is a 61 y.o. male with history of seropositive rheumatoid arthritis.  She states that he continues to have some discomfort in his right shoulder which was replaced.  He also recalls that he had to stop of Orencia when he had right shoulder replacement in February 2023.  He has had 2 flares since then 1 in his right wrist and one in his left knee.  He states his right shoulder joint still bothers him when he gets stressed and does routine activities.  He has off-and-on discomfort in his left knee joint.  He has not noticed any recent joint swelling.  He has been taking Orencia subcu injections weekly.  Activities of Daily Living:  Patient reports morning stiffness for 45-60 minutes.   Patient Denies nocturnal pain.  Difficulty dressing/grooming: Reports Difficulty climbing stairs: Reports Difficulty getting out of chair: Denies Difficulty using hands for taps, buttons, cutlery, and/or writing: Reports  Review of Systems  Constitutional:  Negative for fatigue.  HENT:  Negative for mouth sores and mouth dryness.   Eyes:  Negative for dryness.  Respiratory:  Positive for shortness of breath.   Cardiovascular:  Negative for chest pain and palpitations.  Gastrointestinal:  Negative for blood in stool, constipation and diarrhea.  Endocrine: Negative for increased urination.  Genitourinary:  Negative for involuntary urination.  Musculoskeletal:  Positive for joint pain, joint pain, joint swelling and morning stiffness. Negative for gait problem, myalgias, muscle weakness, muscle tenderness and myalgias.  Skin:  Negative for color change, rash, hair loss and sensitivity to  sunlight.  Allergic/Immunologic: Negative for susceptible to infections.  Neurological:  Negative for dizziness and headaches.  Hematological:  Negative for swollen glands.  Psychiatric/Behavioral:  Negative for depressed mood and sleep disturbance. The patient is nervous/anxious.     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   Syphilis 09/21/2020   Dementia ([place]) 09/21/2020   Rheumatoid arthritis involving multiple sites with positive rheumatoid factor ([place]) 11/30/2016   High risk medications (not anticoagulants) long-term use 11/30/2016    Past Medical History:  Diagnosis Date   Arthritis    RA   Depression    Seizures ([place]) 06/21/2020   ETOH w/d related    Family History  Problem Relation Age of Onset   Alzheimer's disease Father    Healthy Son    Healthy Daughter    Rheum arthritis Daughter    Healthy Daughter    Past Surgical History:  Procedure Laterality Date   ANKLE FRACTURE SURGERY     Left   APPENDECTOMY     IR 3D INDEPENDENT WKST  09/01/2021   IR 3D INDEPENDENT WKST  12/14/2021   IR ANGIO INTRA EXTRACRAN SEL INTERNAL CAROTID BILAT MOD SED  09/01/2021   IR ANGIO INTRA EXTRACRAN SEL INTERNAL CAROTID UNI R MOD SED  12/14/2021   IR ANGIO VERTEBRAL SEL VERTEBRAL BILAT MOD SED  09/01/2021   IR ANGIOGRAM FOLLOW UP STUDY  12/14/2021   IR ANGIOGRAM  FOLLOW UP STUDY  12/14/2021   IR ANGIOGRAM FOLLOW UP STUDY  12/14/2021   IR CT HEAD LTD  12/14/2021   IR TRANSCATH/EMBOLIZ  12/14/2021   IR US GUIDE VASC ACCESS RIGHT  09/01/2021   IR US GUIDE VASC ACCESS RIGHT  12/14/2021   RADIOLOGY WITH ANESTHESIA N/A 12/14/2021   Procedure: IR WITH ANESTHESIA EMBOLIZATION;  Surgeon: Pedro Earls, MD;  Location: [facility];  Service: Radiology;  Laterality: N/A;   REVERSE SHOULDER ARTHROPLASTY Left 05/13/2021   Procedure: REVERSE SHOULDER ARTHROPLASTY;  Surgeon: Netta Cedars, MD;   Location: [facility];  Service: Orthopedics;  Laterality: Left;  with ISB   REVERSE SHOULDER ARTHROPLASTY Right 10/14/2021   Procedure: REVERSE SHOULDER ARTHROPLASTY;  Surgeon: Netta Cedars, MD;  Location: [facility];  Service: Orthopedics;  Laterality: Right;  with ISB   Social History   Social History Narrative   Lives alone   Right Handed   Rarely drinks caffeine       Immunization History  Administered Date(s) Administered   Influenza,inj,Quad PF,6+ Mos 07/04/2018     Objective: Vital Signs: BP 126/76 (BP Location: Right Arm, Patient Position: Sitting, Cuff Size: Normal)   Pulse 74   Resp 18   Ht '5\' 6"'$  (1.676 m)   Wt 188 lb 12.8 oz (85.6 kg)   BMI 30.47 kg/m    Physical Exam Vitals and nursing note reviewed.  Constitutional:      Appearance: He is well-developed.  HENT:     Head: Normocephalic and atraumatic.  Eyes:     Conjunctiva/sclera: Conjunctivae normal.     Pupils: Pupils are equal, round, and reactive to light.  Cardiovascular:     Rate and Rhythm: Normal rate and regular rhythm.     Heart sounds: Normal heart sounds.  Pulmonary:     Effort: Pulmonary effort is normal.     Breath sounds: Normal breath sounds.  Abdominal:     General: Bowel sounds are normal.     Palpations: Abdomen is soft.  Musculoskeletal:     Cervical back: Normal range of motion and neck supple.  Skin:    General: Skin is warm and dry.     Capillary Refill: Capillary refill takes less than 2 seconds.  Neurological:     Mental Status: He is alert and oriented to person, place, and time.  Psychiatric:        Behavior: Behavior normal.      Musculoskeletal Exam: He had good range of motion of the cervical spine.  He had limited painful range of motion of his right shoulder joint.  Left shoulder joint was in full range of motion.  Elbow joints and wrist joints with good range of motion with no synovitis.  There was no synovitis over MCPs or PIPs.  Bilateral PIP and DIP thickening was noted.   Hips and knees were in good range of motion without any warmth swelling or effusion.  There was no tenderness over ankles or MTP joints.  CDAI Exam: CDAI Score: 1.5  Patient Global: 4 mm; Provider Global: 1 mm Swollen: 0 ; Tender: 1  Joint Exam 07/06/2022      Right  Left  Glenohumeral   Tender        Investigation: No additional findings.  Imaging: No results found.  Recent Labs: Lab Results  Component Value Date   WBC 10.5 05/31/2022   HGB 14.4 05/31/2022   PLT  05/31/2022    PLATELET CLUMPS NOTED ON SMEAR, COUNT APPEARS ADEQUATE  NA 139 05/31/2022   K 4.2 05/31/2022   CL 102 05/31/2022   CO2 25 05/31/2022   GLUCOSE 103 (H) 05/31/2022   BUN 13 05/31/2022   CREATININE 0.80 05/31/2022   BILITOT 0.5 04/17/2022   ALKPHOS 62 05/06/2021   AST 12 04/17/2022   ALT 11 04/17/2022   PROT 6.4 04/17/2022   ALBUMIN 4.2 05/06/2021   CALCIUM 9.1 05/31/2022   GFRAA 112 02/25/2021   QFTBGOLD NEGATIVE 07/03/2017   QFTBGOLDPLUS NEGATIVE 09/12/2021    Speciality Comments: Prior therapy includes: Enbrel (pancytopenia) and methotrexate (inadequate response) Orencia-07/09/18 Enbrel-1/19-10/19  Procedures:  No procedures performed Allergies: Patient has no known allergies.   Assessment / Plan:     Visit Diagnoses: Rheumatoid arthritis involving multiple sites with positive rheumatoid factor (HCC)-patient states he had 2 flares, one in right wrist and other in the left knee since he had right total shoulder replacement.  He is not having any joint swelling at this time.  He has been taking Orencia subcu weekly without any interruption now.  He continues to have some stiffness.  High risk medications (not anticoagulants) long-term use - Orencia 125 mg sq injections once weekly. -He had CBC obtained on May 31, 2022 which was reviewed.  Labs were also obtained in August 2023 which were reviewed.  He had recent BMP which was within normal limits.  I will check AST and ALT today.  He  was advised to get labs in February and every 3 months.  We will need TB Gold with his next labs.  Plan: AST, ALT.  Information on immunization was placed in the AVS.  He was advised to hold Orencia if he develops an infection and resume after the infection resolves.  Status post replacement of both shoulder joints - Reverse RTSR by Dr. Veverly Fells on 10/14/21.RLTSR May 13, 2022.  He had some discomfort and limitation with range of motion of the right shoulder.  Primary osteoarthritis of both hands-he had bilateral PIP and DIP thickening.  No synovitis was noted.  Primary osteoarthritis of both knees -he complains of discomfort in his left knee joint.  No warmth swelling or effusion was noted.  X-rays of both knees were obtained on 08/27/2020 which were consistent with severe osteoarthritis and severe chondromalacia patella.  Primary osteoarthritis of both feet-he had no tenderness over ankles or MTPs.  Proper fitting shoes were advised.  Other medical problems are listed as follows:  S/P coil embolization of cerebral aneurysm - 12/14/21 Performed by IR.  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  History of alcohol abuse  Orders: Orders Placed This Encounter  Procedures   AST   ALT   No orders of the defined types were placed in this encounter.    Follow-Up Instructions: Return in about 5 months (around 12/05/2022) for Rheumatoid arthritis, Osteoarthritis.   Bo Merino, MD  Note - This record has been created using Editor, commissioning.  Chart creation errors have been sought, but may not always  have been located. Such creation errors do not reflect on  the standard of medical care.

## 2022-06-23 ENCOUNTER — Other Ambulatory Visit (HOSPITAL_COMMUNITY): Payer: Self-pay

## 2022-07-06 ENCOUNTER — Ambulatory Visit: Payer: BC Managed Care – PPO | Attending: Rheumatology | Admitting: Rheumatology

## 2022-07-06 ENCOUNTER — Encounter: Payer: Self-pay | Admitting: Rheumatology

## 2022-07-06 VITALS — BP 126/76 | HR 74 | Resp 18 | Ht 66.0 in | Wt 188.8 lb

## 2022-07-06 DIAGNOSIS — M0579 Rheumatoid arthritis with rheumatoid factor of multiple sites without organ or systems involvement: Secondary | ICD-10-CM

## 2022-07-06 DIAGNOSIS — M19041 Primary osteoarthritis, right hand: Secondary | ICD-10-CM

## 2022-07-06 DIAGNOSIS — Z96611 Presence of right artificial shoulder joint: Secondary | ICD-10-CM

## 2022-07-06 DIAGNOSIS — Z79899 Other long term (current) drug therapy: Secondary | ICD-10-CM | POA: Diagnosis not present

## 2022-07-06 DIAGNOSIS — M19072 Primary osteoarthritis, left ankle and foot: Secondary | ICD-10-CM

## 2022-07-06 DIAGNOSIS — F1011 Alcohol abuse, in remission: Secondary | ICD-10-CM

## 2022-07-06 DIAGNOSIS — R413 Other amnesia: Secondary | ICD-10-CM

## 2022-07-06 DIAGNOSIS — A530 Latent syphilis, unspecified as early or late: Secondary | ICD-10-CM

## 2022-07-06 DIAGNOSIS — Z9889 Other specified postprocedural states: Secondary | ICD-10-CM

## 2022-07-06 DIAGNOSIS — M170 Bilateral primary osteoarthritis of knee: Secondary | ICD-10-CM

## 2022-07-06 DIAGNOSIS — Z96612 Presence of left artificial shoulder joint: Secondary | ICD-10-CM

## 2022-07-06 DIAGNOSIS — M19071 Primary osteoarthritis, right ankle and foot: Secondary | ICD-10-CM

## 2022-07-06 DIAGNOSIS — M19042 Primary osteoarthritis, left hand: Secondary | ICD-10-CM

## 2022-07-06 NOTE — Patient Instructions (Signed)
Standing Labs We placed an order today for your standing lab work.   Please have your standing labs drawn in February and every 3 months  Please have your labs drawn 2 weeks prior to your appointment so that the provider can discuss your lab results at your appointment.  Please note that you may see your imaging and lab results in MyChart before we have reviewed them. We will contact you once all results are reviewed. Please allow our office up to 72 hours to thoroughly review all of the results before contacting the office for clarification of your results.  Lab hours are:   Monday through Thursday from 8:00 am -12:30 pm and 1:00 pm-5:00 pm and Friday from 8:00 am-12:00 pm.  Please be advised, all patients with office appointments requiring lab work will take precedent over walk-in lab work.   Labs are drawn by [facility]. Please bring your co-pay at the time of your lab draw.  You may receive a bill from [facility] for your lab work.  Please note if you are on Hydroxychloroquine and and an order has been placed for a Hydroxychloroquine level, you will need to have it drawn 4 hours or more after your last dose.  If you wish to have your labs drawn at another location, please call the office 24 hours in advance so we can fax the orders.  The office is located at [address], [address], [place], [place] [address] No appointment is necessary.    If you have any questions regarding directions or hours of operation,  please call [phone].   As a reminder, please drink plenty of water prior to coming for your lab work. Thanks!   Vaccines You are taking a medication(s) that can suppress your immune system.  The following immunizations are recommended: Flu annually Covid-19  Td/Tdap (tetanus, diphtheria, pertussis) every 10 years Pneumonia (Prevnar 15 then Pneumovax 23 at least 1 year apart.  Alternatively, can take Prevnar 20 without needing additional dose) Shingrix: 2 doses from 4 weeks  to 6 months apart  Please check with your PCP to make sure you are up to date.   If you have signs or symptoms of an infection or start antibiotics: First, call your PCP for workup of your infection. Hold your medication through the infection, until you complete your antibiotics, and until symptoms resolve if you take the following: Injectable medication (Actemra, Benlysta, Cimzia, Cosentyx, Enbrel, Humira, Kevzara, Orencia, Remicade, Simponi, Stelara, Taltz, Tremfya) Methotrexate Leflunomide (Arava) Mycophenolate (Cellcept) Xeljanz, Olumiant, or Rinvoq  

## 2022-07-07 LAB — AST: AST: 15 U/L (ref 10–35)

## 2022-07-07 LAB — ALT: ALT: 17 U/L (ref 9–46)

## 2022-07-07 NOTE — Progress Notes (Signed)
LFTs are normal

## 2022-07-17 ENCOUNTER — Other Ambulatory Visit (HOSPITAL_COMMUNITY): Payer: Self-pay

## 2022-07-21 ENCOUNTER — Other Ambulatory Visit (HOSPITAL_COMMUNITY): Payer: Self-pay

## 2022-08-02 ENCOUNTER — Other Ambulatory Visit (HOSPITAL_COMMUNITY): Payer: Self-pay

## 2022-08-02 ENCOUNTER — Other Ambulatory Visit: Payer: Self-pay

## 2022-08-03 ENCOUNTER — Encounter: Payer: Self-pay | Admitting: Nurse Practitioner

## 2022-08-03 ENCOUNTER — Other Ambulatory Visit (HOSPITAL_COMMUNITY): Payer: Self-pay

## 2022-08-03 ENCOUNTER — Ambulatory Visit (INDEPENDENT_AMBULATORY_CARE_PROVIDER_SITE_OTHER): Payer: BC Managed Care – PPO | Admitting: Nurse Practitioner

## 2022-08-03 VITALS — BP 121/74 | HR 61 | Temp 96.3°F | Resp 20 | Ht 66.0 in | Wt 190.0 lb

## 2022-08-03 DIAGNOSIS — Z6830 Body mass index (BMI) 30.0-30.9, adult: Secondary | ICD-10-CM | POA: Diagnosis not present

## 2022-08-03 DIAGNOSIS — M0579 Rheumatoid arthritis with rheumatoid factor of multiple sites without organ or systems involvement: Secondary | ICD-10-CM | POA: Diagnosis not present

## 2022-08-03 DIAGNOSIS — Z23 Encounter for immunization: Secondary | ICD-10-CM

## 2022-08-03 DIAGNOSIS — F03B4 Unspecified dementia, moderate, with anxiety: Secondary | ICD-10-CM

## 2022-08-03 DIAGNOSIS — F322 Major depressive disorder, single episode, severe without psychotic features: Secondary | ICD-10-CM

## 2022-08-03 MED ORDER — BUPROPION HCL ER (XL) 150 MG PO TB24: 150.0000 mg | ORAL_TABLET | Freq: Every day | ORAL | 1 refills | Status: DC

## 2022-08-03 MED ORDER — ESCITALOPRAM OXALATE 10 MG PO TABS: 10.0000 mg | ORAL_TABLET | Freq: Every day | ORAL | 1 refills | Status: DC

## 2022-08-03 NOTE — Progress Notes (Signed)
Subjective:    Patient ID: Daniel Griffin, male    DOB: 11-16-1960, 61 y.o.   MRN: [id number]   Chief Complaint: Medical Management of Chronic Issues    HPI:  Daniel Griffin is a 61 y.o. who identifies as a male who was assigned male at birth.   Social history: Lives with: alone Work history: disabled   Comes in today for follow up of the following chronic medical issues:  1. Rheumatoid arthritis involving multiple sites with positive rheumatoid factor ([place]) Followed by rheumatology; sees them every 3 months.   2. Moderate dementia with anxiety, unspecified dementia type ([place]) Feels like he does well for the most part.     08/03/2022    8:01 AM 01/31/2022    8:22 AM 09/14/2021   10:56 AM 02/15/2021    3:10 PM  GAD 7 : Generalized Anxiety Score  Nervous, Anxious, on Edge 0 0 0 3  Control/stop worrying 0 0 0 2  Worry too much - different things 0 0 0 2  Trouble relaxing 0 0 0 1  Restless 0 0 0 1  Easily annoyed or irritable 0 0 0 2  Afraid - awful might happen 0 0 0 1  Total GAD 7 Score 0 0 0 12  Anxiety Difficulty Not difficult at all Not difficult at all Not difficult at all Somewhat difficult       08/03/2022    8:00 AM 01/31/2022    8:22 AM 09/14/2021   10:56 AM 02/15/2021    3:07 PM 01/21/2021   12:05 PM  Depression screen PHQ 2/9  Decreased Interest 0 0 0 3 0  Down, Depressed, Hopeless 0 0 0 2 0  PHQ - 2 Score 0 0 0 5 0  Altered sleeping 0 0 0 3   Tired, decreased energy 0 0 0 3   Change in appetite 0 0 0 3   Feeling bad or failure about yourself  0 0 0 3   Trouble concentrating 0 0 0 3   Moving slowly or fidgety/restless 0 0 0 3   Suicidal thoughts 0 0 0 0   PHQ-9 Score 0 0 0 23   Difficult doing work/chores Not difficult at all Not difficult at all Not difficult at all Somewhat difficult      3. Body mass index 30.0-30.9, adult Weight is up 2lb since last check. Wt Readings from Last 3 Encounters:  08/03/22 190 lb (86.2 kg)  07/06/22 188 lb  12.8 oz (85.6 kg)  05/31/22 200 lb (90.7 kg)   BMI Readings from Last 3 Encounters:  08/03/22 30.67 kg/m  07/06/22 30.47 kg/m  05/31/22 31.32 kg/m     New complaints: None today  No Known Allergies Outpatient Encounter Medications as of 08/03/2022  Medication Sig   Abatacept (ORENCIA CLICKJECT) 656 MG/ML SOAJ INJECT 125 MG INTO THE SKIN ONCE A WEEK.   buPROPion (WELLBUTRIN XL) 150 MG 24 hr tablet Take 1 tablet (150 mg total) by mouth daily.   escitalopram (LEXAPRO) 10 MG tablet Take 1 tablet (10 mg total) by mouth daily.   No facility-administered encounter medications on file as of 08/03/2022.    Past Surgical History:  Procedure Laterality Date   ANKLE FRACTURE SURGERY     Left   APPENDECTOMY     IR 3D INDEPENDENT WKST  09/01/2021   IR 3D INDEPENDENT WKST  12/14/2021   IR ANGIO INTRA EXTRACRAN SEL INTERNAL CAROTID BILAT MOD SED  09/01/2021  IR ANGIO INTRA EXTRACRAN SEL INTERNAL CAROTID UNI R MOD SED  12/14/2021   IR ANGIO VERTEBRAL SEL VERTEBRAL BILAT MOD SED  09/01/2021   IR ANGIOGRAM FOLLOW UP STUDY  12/14/2021   IR ANGIOGRAM FOLLOW UP STUDY  12/14/2021   IR ANGIOGRAM FOLLOW UP STUDY  12/14/2021   IR CT HEAD LTD  12/14/2021   IR TRANSCATH/EMBOLIZ  12/14/2021   IR US GUIDE VASC ACCESS RIGHT  09/01/2021   IR US GUIDE VASC ACCESS RIGHT  12/14/2021   RADIOLOGY WITH ANESTHESIA N/A 12/14/2021   Procedure: IR WITH ANESTHESIA EMBOLIZATION;  Surgeon: Pedro Earls, MD;  Location: [facility];  Service: Radiology;  Laterality: N/A;   REVERSE SHOULDER ARTHROPLASTY Left 05/13/2021   Procedure: REVERSE SHOULDER ARTHROPLASTY;  Surgeon: Netta Cedars, MD;  [facility];  Service: Orthopedics;  Laterality: Left;  with ISB   REVERSE SHOULDER ARTHROPLASTY Right 10/14/2021   Procedure: REVERSE SHOULDER ARTHROPLASTY;  Surgeon: Netta Cedars, MD;  Location[facility];  Service: Orthopedics;  Laterality: Right;  with ISB    Family History  Problem Relation Age of Onset   Alzheimer's  disease Father    Healthy Son    Healthy Daughter    Rheum arthritis Daughter    Healthy Daughter       Controlled substance contract: n/a     Review of Systems  Constitutional:  Negative for activity change, appetite change, diaphoresis and fatigue.       Has had episodes of diaphoresis in the past but not recently.  Respiratory:  Negative for chest tightness and shortness of breath.   Cardiovascular:  Positive for leg swelling. Negative for chest pain and palpitations.       Legs swell sometimes after being on his feet, but resolve with rest.  Gastrointestinal:  Negative for abdominal pain.  Endocrine: Negative for cold intolerance and heat intolerance.  Genitourinary:  Negative for difficulty urinating.  Musculoskeletal:  Positive for arthralgias and joint swelling.  Neurological:  Negative for dizziness, weakness and headaches.  Hematological:  Negative for adenopathy. Does not bruise/bleed easily.  Psychiatric/Behavioral:  Positive for sleep disturbance.        Has a hard time falling asleep sometimes but able to sleep through the night once he does.  All other systems reviewed and are negative.      Objective:   Physical Exam Vitals and nursing note reviewed.  Constitutional:      General: He is not in acute distress.    Appearance: Normal appearance. He is well-developed.  HENT:     Head: Normocephalic and atraumatic.     Right Ear: Tympanic membrane normal.     Left Ear: Tympanic membrane normal.     Nose: Nose normal.     Mouth/Throat:     Mouth: Mucous membranes are moist.     Pharynx: Oropharynx is clear.  Eyes:     Conjunctiva/sclera: Conjunctivae normal.     Pupils: Pupils are equal, round, and reactive to light.  Neck:     Vascular: No carotid bruit.  Cardiovascular:     Rate and Rhythm: Normal rate and regular rhythm.     Pulses: Normal pulses.     Heart sounds: Normal heart sounds.  Pulmonary:     Effort: Pulmonary effort is normal. No  respiratory distress.     Breath sounds: Normal breath sounds. No wheezing, rhonchi or rales.  Abdominal:     General: Bowel sounds are normal.     Palpations: Abdomen is  soft.     Tenderness: There is no abdominal tenderness.  Musculoskeletal:     Cervical back: Normal range of motion. No tenderness.     Comments: Slow, but full ROM; Joint effusions (worse in knees) due to RA  Lymphadenopathy:     Cervical: No cervical adenopathy.  Skin:    General: Skin is warm and dry.     Capillary Refill: Capillary refill takes less than 2 seconds.  Neurological:     General: No focal deficit present.     Mental Status: He is alert and oriented to person, place, and time.  Psychiatric:        Mood and Affect: Mood normal.        Behavior: Behavior normal.      BP 121/74   Pulse 61   Temp (!) 96.3 F (35.7 C) (Temporal)   Resp 20   Ht '5\' 6"'$  (1.676 m)   Wt 190 lb (86.2 kg)   SpO2 96%   BMI 30.67 kg/m       Assessment & Plan:   SNEIJDER BERNARDS comes in today with chief complaint of Medical Management of Chronic Issues   Diagnosis and orders addressed:  1. Rheumatoid arthritis involving multiple sites with positive rheumatoid factor ([place]) Keep appointments with rheumatologist  2. Moderate dementia with anxiety, unspecified dementia type ([place]) Read daily, do puzzles to exercise brain.  3. Body mass index 30.0-30.9, adult Discussed diet and exercise for person with BMI > 25 Will recheck weight in 3-6 months  4. Depression, major, single episode, severe (HCC) Stress management - escitalopram (LEXAPRO) 10 MG tablet; Take 1 tablet (10 mg total) by mouth daily.  Dispense: 90 tablet; Refill: 1   Labs pending Health Maintenance reviewed Diet and exercise encouraged  Follow up plan: 6 months  Collene Leyden, FNP student    Chevis Pretty, [place]

## 2022-08-03 NOTE — Patient Instructions (Signed)

## 2022-08-17 ENCOUNTER — Other Ambulatory Visit (HOSPITAL_COMMUNITY): Payer: Self-pay

## 2022-08-21 DIAGNOSIS — C44300 Unspecified malignant neoplasm of skin of unspecified part of face: Secondary | ICD-10-CM

## 2022-08-21 HISTORY — PX: MOHS SURGERY: SUR867

## 2022-08-21 HISTORY — DX: Unspecified malignant neoplasm of skin of unspecified part of face: C44.300

## 2022-08-22 ENCOUNTER — Other Ambulatory Visit (HOSPITAL_COMMUNITY): Payer: Self-pay

## 2022-08-28 ENCOUNTER — Other Ambulatory Visit: Payer: Self-pay

## 2022-08-28 ENCOUNTER — Other Ambulatory Visit (HOSPITAL_COMMUNITY): Payer: Self-pay

## 2022-09-19 ENCOUNTER — Other Ambulatory Visit (HOSPITAL_COMMUNITY): Payer: Self-pay

## 2022-09-21 ENCOUNTER — Other Ambulatory Visit (HOSPITAL_COMMUNITY): Payer: Self-pay

## 2022-10-02 ENCOUNTER — Other Ambulatory Visit: Payer: Self-pay | Admitting: *Deleted

## 2022-10-02 ENCOUNTER — Other Ambulatory Visit: Payer: Self-pay

## 2022-10-02 ENCOUNTER — Other Ambulatory Visit: Payer: Self-pay | Admitting: Rheumatology

## 2022-10-02 ENCOUNTER — Other Ambulatory Visit (HOSPITAL_COMMUNITY): Payer: Self-pay

## 2022-10-02 DIAGNOSIS — Z79899 Other long term (current) drug therapy: Secondary | ICD-10-CM | POA: Diagnosis not present

## 2022-10-02 DIAGNOSIS — Z111 Encounter for screening for respiratory tuberculosis: Secondary | ICD-10-CM

## 2022-10-02 DIAGNOSIS — Z9225 Personal history of immunosupression therapy: Secondary | ICD-10-CM | POA: Diagnosis not present

## 2022-10-02 MED ORDER — ORENCIA CLICKJECT 125 MG/ML ~~LOC~~ SOAJ
125.0000 mg | SUBCUTANEOUS | 0 refills | Status: DC
  Filled 2022-10-02: qty 4, 28d supply, fill #0
  Filled 2022-10-27: qty 4, 28d supply, fill #1
  Filled 2022-11-23: qty 4, 28d supply, fill #2

## 2022-10-02 NOTE — Telephone Encounter (Signed)
Due to update lab work

## 2022-10-02 NOTE — Telephone Encounter (Signed)
Next Visit: 12/06/2022  Last Visit: 07/06/2022  Last Fill: 06/08/2022   DX: Rheumatoid arthritis involving multiple sites with positive rheumatoid factor   Current Dose per office note 07/06/2022: Orencia 125 mg sq injections once weekly   Labs: 05/31/2022 Neutro Abs 8.4  TB Gold: 09/12/2021 Neg    Okay to refill Orencia?

## 2022-10-03 ENCOUNTER — Other Ambulatory Visit (HOSPITAL_COMMUNITY): Payer: Self-pay

## 2022-10-03 NOTE — Progress Notes (Signed)
CBC and CMP are normal.

## 2022-10-04 LAB — CBC WITH DIFFERENTIAL/PLATELET
Absolute Monocytes: 775 cells/uL (ref 200–950)
Basophils Absolute: 40 cells/uL (ref 0–200)
Basophils Relative: 0.7 %
Eosinophils Absolute: 154 cells/uL (ref 15–500)
Eosinophils Relative: 2.7 %
HCT: 40.7 % (ref 38.5–50.0)
Hemoglobin: 14 g/dL (ref 13.2–17.1)
Lymphs Abs: 1488 cells/uL (ref 850–3900)
MCH: 30.8 pg (ref 27.0–33.0)
MCHC: 34.4 g/dL (ref 32.0–36.0)
MCV: 89.5 fL (ref 80.0–100.0)
MPV: 10.1 fL (ref 7.5–12.5)
Monocytes Relative: 13.6 %
Neutro Abs: 3243 cells/uL (ref 1500–7800)
Neutrophils Relative %: 56.9 %
Platelets: 229 10*3/uL (ref 140–400)
RBC: 4.55 10*6/uL (ref 4.20–5.80)
RDW: 12.8 % (ref 11.0–15.0)
Total Lymphocyte: 26.1 %
WBC: 5.7 10*3/uL (ref 3.8–10.8)

## 2022-10-04 LAB — COMPLETE METABOLIC PANEL WITH GFR
AG Ratio: 1.3 (calc) (ref 1.0–2.5)
ALT: 17 U/L (ref 9–46)
AST: 21 U/L (ref 10–35)
Albumin: 3.7 g/dL (ref 3.6–5.1)
Alkaline phosphatase (APISO): 68 U/L (ref 35–144)
BUN: 14 mg/dL (ref 7–25)
CO2: 29 mmol/L (ref 20–32)
Calcium: 9 mg/dL (ref 8.6–10.3)
Chloride: 103 mmol/L (ref 98–110)
Creat: 0.81 mg/dL (ref 0.70–1.35)
Globulin: 2.9 g/dL (calc) (ref 1.9–3.7)
Glucose, Bld: 92 mg/dL (ref 65–99)
Potassium: 4.7 mmol/L (ref 3.5–5.3)
Sodium: 140 mmol/L (ref 135–146)
Total Bilirubin: 0.5 mg/dL (ref 0.2–1.2)
Total Protein: 6.6 g/dL (ref 6.1–8.1)
eGFR: 100 mL/min/{1.73_m2} (ref >=60)

## 2022-10-04 LAB — QUANTIFERON-TB GOLD PLUS
Mitogen-NIL: 8.09 IU/mL
NIL: 0.09 IU/mL
QuantiFERON-TB Gold Plus: NEGATIVE
TB1-NIL: 0.02 IU/mL
TB2-NIL: 0.02 IU/mL

## 2022-10-05 NOTE — Progress Notes (Signed)
TB Gold is negative.

## 2022-10-09 ENCOUNTER — Telehealth: Payer: Self-pay | Admitting: *Deleted

## 2022-10-09 NOTE — Telephone Encounter (Signed)
I left a message for the surgery scheduler Vernie Ammons with Dr. Esmond Plants. Left message per our pre op APP: just clarifying that they still want it since we don't really follow him    We have only seen the pt x 1 and then was stated only as PRN. If needing cardiac clearance we will schedule a tele visit. Please call back [phone] to let our office know if cardiac clearance is needed.

## 2022-10-09 NOTE — Telephone Encounter (Signed)
Primary Cardiologist:Branch, Royetta Crochet, MD  This patient has been seen only once by Dr. Phineas Inches 03/2022.  She advised he may follow-up as needed. Please verify that requesting office does want to proceed with awaiting cardiac clearance.  Preoperative team, please contact this patient and set up a phone call appointment for further preoperative risk assessment. Please obtain consent and complete medication review. Thank you for your help.   I confirm that guidance regarding antiplatelet and oral anticoagulation therapy has been completed and, if necessary, noted below.   Daniel Life, NP-C  10/09/2022, 1:33 PM [address] Office [phone] Fax [phone]

## 2022-10-09 NOTE — Telephone Encounter (Signed)
Per Julaine Hua, CMA, she spoke with surgery scheduler and cardiac clearance is not needed.  We will remove this request from the preop pool.

## 2022-10-09 NOTE — Telephone Encounter (Signed)
   Pre-operative Risk Assessment    Patient Name: Daniel Griffin  DOB: October 27, 1960 MRN: [id number]      Request for Surgical Clearance    Procedure:   LEFT TOTAL KNEE ARTHROPLASTY   Date of Surgery:  Clearance TBD                                 Surgeon:  DR. Esmond Griffin  Surgeon's Group or Practice Name:  [facility]  Phone number:  [phone] ATTN: [place] Fax number:  [phone]   Type of Clearance Requested:   - Medical ; NO MEDICATIONS LISTED AS NEEDING TO BE HELD   Type of Anesthesia:  General  & SPINAL    Additional requests/questions:    Daniel Griffin   10/09/2022, 10:29 AM

## 2022-10-24 ENCOUNTER — Encounter: Payer: Self-pay | Admitting: Nurse Practitioner

## 2022-10-24 ENCOUNTER — Ambulatory Visit (INDEPENDENT_AMBULATORY_CARE_PROVIDER_SITE_OTHER): Payer: BC Managed Care – PPO | Admitting: Nurse Practitioner

## 2022-10-24 VITALS — BP 111/65 | HR 63 | Temp 97.1°F | Resp 20 | Ht 66.0 in | Wt 187.0 lb

## 2022-10-24 DIAGNOSIS — Z01818 Encounter for other preprocedural examination: Secondary | ICD-10-CM

## 2022-10-24 NOTE — Progress Notes (Signed)
   Subjective:    Patient ID: Daniel Griffin, male    DOB: 06-Jul-1961, 62 y.o.   MRN: [id number]   Chief Complaint: operative clearance  HPI Patient is going to be scheduled for Left total knee replacement once he gets surgical clearance. He is feeling good today.no complaints. 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  Eyes:  Negative for pain.  Respiratory:  Negative for shortness of breath.   Cardiovascular:  Negative for chest pain, palpitations and leg swelling.  Gastrointestinal:  Negative for abdominal pain.  Endocrine: Negative for polydipsia.  Skin:  Negative for rash.  Neurological:  Negative for dizziness, weakness and headaches.  Hematological:  Does not bruise/bleed easily.  All other systems reviewed and are negative.      Objective:   Physical Exam Constitutional:      Appearance: Normal appearance.  Cardiovascular:     Rate and Rhythm: Normal rate and regular rhythm.     Heart sounds: Normal heart sounds.  Pulmonary:     Effort: Pulmonary effort is normal.     Breath sounds: Normal breath sounds.  Skin:    General: Skin is warm.  Neurological:     General: No focal deficit present.     Mental Status: He is alert and oriented to person, place, and time.  Psychiatric:        Mood and Affect: Mood normal.        Behavior: Behavior normal.     BP 111/65   Pulse 63   Temp (!) 97.1 F (36.2 C) (Temporal)   Resp 20   Ht '5\' 6"'$  (1.676 m)   Wt 187 lb (84.8 kg)   SpO2 (!) 85%   BMI 30.18 kg/m   Daniel Nissen,  FNP      Assessment & Plan:   Daniel Griffin in today with chief complaint of No chief complaint on file.   1. Pre-operative clearance Medically cleared for surgery - EKG 12-Lead    The above assessment and management plan was discussed with the patient. The patient verbalized understanding of and has agreed to the management plan. Patient is aware to call the clinic if symptoms persist or worsen. Patient is aware when to return to the clinic for a follow-up visit. Patient educated on when it is appropriate to go to the emergency department.   Daniel Hassell Done, FNP

## 2022-10-25 ENCOUNTER — Other Ambulatory Visit (HOSPITAL_COMMUNITY): Payer: Self-pay
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
## 2022-10-27 ENCOUNTER — Other Ambulatory Visit (HOSPITAL_COMMUNITY): Payer: Self-pay

## 2022-10-30 ENCOUNTER — Encounter: Payer: Self-pay | Admitting: Adult Health

## 2022-10-30 ENCOUNTER — Ambulatory Visit (INDEPENDENT_AMBULATORY_CARE_PROVIDER_SITE_OTHER): Payer: BC Managed Care – PPO | Admitting: Adult Health

## 2022-10-30 VITALS — BP 133/70 | HR 55 | Ht 67.0 in | Wt 190.0 lb

## 2022-10-30 DIAGNOSIS — G3184 Mild cognitive impairment, so stated: Secondary | ICD-10-CM | POA: Diagnosis not present

## 2022-10-30 DIAGNOSIS — I671 Cerebral aneurysm, nonruptured: Secondary | ICD-10-CM

## 2022-10-30 NOTE — Patient Instructions (Signed)
It was great to see you again today!   No changes at this time, can continue to follow with your primary doctor for monitoring of memory loss as this has been stable  Please ensure you are routinely doing memory exercises, keeping physically active and socializing routinely

## 2022-11-20 HISTORY — PX: REPLACEMENT TOTAL KNEE: SUR1224

## 2022-11-21 ENCOUNTER — Other Ambulatory Visit (HOSPITAL_COMMUNITY): Payer: Self-pay

## 2022-11-21 ENCOUNTER — Other Ambulatory Visit: Payer: Self-pay

## 2022-11-22 NOTE — Progress Notes (Unsigned)
Office Visit Note  Patient: Daniel Griffin             Date of Birth: 1961-07-10           MRN: [id number]             PCP: Bennie Pierini, FNP Referring: Bennie Pierini, * Visit Date: 12/06/2022 Occupation: @  Subjective:  Left knee pain  History of Present Illness: Daniel Griffin is a 62 y.o. male with history of seropositive rheumatoid arthritis and osteoarthritis.  He remains on orencia 125 mg sq injections once weekly.  He is tolerating Orencia without any side effects or injection site reactions.  Patient reports that he plans on skipping his dose of Orencia this week since he is scheduled for a left knee replacement with Dr. Durwin Nora next week.  He is aware that he will require clearance by Dr. Durwin Nora prior to resuming Orencia as prescribed.  He denies any other joint pain or joint swelling at this time. He denies any recent or recurrent infections.      Activities of Daily Living:  Patient reports morning stiffness for several hours.   Patient Reports nocturnal pain.  Difficulty dressing/grooming: Denies Difficulty climbing stairs: Reports Difficulty getting out of chair: Reports Difficulty using hands for taps, buttons, cutlery, and/or writing: Denies  Review of Systems  Constitutional:  Positive for fatigue.  HENT:  Positive for mouth dryness. Negative for mouth sores.   Eyes:  Positive for dryness.  Respiratory:  Positive for shortness of breath.   Cardiovascular:  Negative for chest pain and palpitations.  Gastrointestinal:  Negative for blood in stool, constipation and diarrhea.  Endocrine: Negative for increased urination.  Genitourinary:  Negative for involuntary urination.  Musculoskeletal:  Positive for joint pain, gait problem, joint pain, joint swelling and morning stiffness. Negative for myalgias, muscle weakness, muscle tenderness and myalgias.  Skin:  Negative for color change, rash, hair loss and sensitivity to sunlight.   Allergic/Immunologic: Negative for susceptible to infections.  Neurological:  Negative for dizziness and headaches.  Hematological:  Negative for swollen glands.  Psychiatric/Behavioral:  Negative for depressed mood and sleep disturbance. The patient is not nervous/anxious.     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   Syphilis 09/21/2020   Dementia 09/21/2020   Rheumatoid arthritis involving multiple sites with positive rheumatoid factor 11/30/2016   High risk medications (not anticoagulants) long-term use 11/30/2016    Past Medical History:  Diagnosis Date   Arthritis    RA   Depression    Seizures 06/21/2020   ETOH w/d related    Family History  Problem Relation Age of Onset   Alzheimer's disease Father    Healthy Son    Healthy Daughter    Rheum arthritis Daughter    Healthy Daughter    Past Surgical History:  Procedure Laterality Date   ANKLE FRACTURE SURGERY     Left   APPENDECTOMY     IR 3D INDEPENDENT WKST  09/01/2021   IR 3D INDEPENDENT WKST  12/14/2021   IR ANGIO INTRA EXTRACRAN SEL INTERNAL CAROTID BILAT MOD SED  09/01/2021   IR ANGIO INTRA EXTRACRAN SEL INTERNAL CAROTID UNI R MOD SED  12/14/2021   IR ANGIO VERTEBRAL SEL VERTEBRAL BILAT MOD SED  09/01/2021   IR ANGIOGRAM FOLLOW UP STUDY  12/14/2021   IR  ANGIOGRAM FOLLOW UP STUDY  12/14/2021   IR ANGIOGRAM FOLLOW UP STUDY  12/14/2021   IR CT HEAD LTD  12/14/2021   IR TRANSCATH/EMBOLIZ  12/14/2021   IR US GUIDE VASC ACCESS RIGHT  09/01/2021   IR US GUIDE VASC ACCESS RIGHT  12/14/2021   RADIOLOGY WITH ANESTHESIA N/A 12/14/2021   Procedure: IR WITH ANESTHESIA EMBOLIZATION;  Surgeon: Baldemar Lenis, MD;  Location: [facility] OR;  Service: Radiology;  Laterality: N/A;   REVERSE SHOULDER ARTHROPLASTY Left 05/13/2021   Procedure: REVERSE SHOULDER ARTHROPLASTY;   Surgeon: Beverely Low, MD;  Location: [facility] ORS;  Service: Orthopedics;  Laterality: Left;  with ISB   REVERSE SHOULDER ARTHROPLASTY Right 10/14/2021   Procedure: REVERSE SHOULDER ARTHROPLASTY;  Surgeon: Beverely Low, MD;  Location: [facility] ORS;  Service: Orthopedics;  Laterality: Right;  with ISB   Social History   Social History Narrative   Lives alone   Right Handed   Rarely drinks caffeine       Immunization History  Administered Date(s) Administered   Influenza,inj,Quad PF,6+ Mos 07/04/2018, 08/03/2022     Objective: Vital Signs: BP (!) 147/76 (BP Location: Right Arm, Patient Position: Sitting, Cuff Size: Normal)   Pulse 64   Resp 17   Ht 5\' 7"  (1.702 m)   Wt 182 lb 3.2 oz (82.6 kg)   BMI 28.54 kg/m    Physical Exam Vitals and nursing note reviewed.  Constitutional:      Appearance: He is well-developed.  HENT:     Head: Normocephalic and atraumatic.  Eyes:     Conjunctiva/sclera: Conjunctivae normal.     Pupils: Pupils are equal, round, and reactive to light.  Cardiovascular:     Rate and Rhythm: Normal rate and regular rhythm.     Heart sounds: Normal heart sounds.  Pulmonary:     Effort: Pulmonary effort is normal.     Breath sounds: Normal breath sounds.  Abdominal:     General: Bowel sounds are normal.     Palpations: Abdomen is soft.  Musculoskeletal:     Cervical back: Normal range of motion and neck supple.  Skin:    General: Skin is warm and dry.     Capillary Refill: Capillary refill takes less than 2 seconds.  Neurological:     Mental Status: He is alert and oriented to person, place, and time.  Psychiatric:        Behavior: Behavior normal.      Musculoskeletal Exam: C-spine has good range of motion.  Limited range of motion of both shoulder replacements but no discomfort at this time.  Elbow joints and wrist joints have good range of motion.  No tenderness or synovitis over MCP or PIP joints.  Some PIP and DIP thickening consistent with  osteoarthritis of both hands.  Hip joints have good range of motion with no groin pain.  Knee joints have good range of motion with discomfort bilaterally.  Crepitus noted in both knees.  Ankle joints have good range of motion with no tenderness or joint swelling.   CDAI Exam: CDAI Score: -- Patient Global: 1 mm; Provider Global: 1 mm Swollen: --; Tender: -- Joint Exam 12/06/2022   No joint exam has been documented for this visit   There is currently no information documented on the homunculus. Go to the Rheumatology activity and complete the homunculus joint exam.  Investigation: No additional findings.  Imaging: No results found.  Recent Labs: Lab Results  Component Value Date   WBC 5.7  10/02/2022   HGB 14.0 10/02/2022   PLT 229 10/02/2022   NA 140 10/02/2022   K 4.7 10/02/2022   CL 103 10/02/2022   CO2 29 10/02/2022   GLUCOSE 92 10/02/2022   BUN 14 10/02/2022   CREATININE 0.81 10/02/2022   BILITOT 0.5 10/02/2022   ALKPHOS 62 05/06/2021   AST 21 10/02/2022   ALT 17 10/02/2022   PROT 6.6 10/02/2022   ALBUMIN 4.2 05/06/2021   CALCIUM 9.0 10/02/2022   GFRAA 112 02/25/2021   QFTBGOLD NEGATIVE 07/03/2017   QFTBGOLDPLUS NEGATIVE 10/02/2022    Speciality Comments: Prior therapy includes: Enbrel (pancytopenia) and methotrexate (inadequate response) Orencia-07/09/18 Enbrel-1/19-10/19  Procedures:  No procedures performed Allergies: Patient has no known allergies.   Assessment / Plan:     Visit Diagnoses: Rheumatoid arthritis involving multiple sites with positive rheumatoid factor: No synovitis noted on examination today.  He has not had any signs or symptoms of a rheumatoid arthritis flare.  He has clinically been doing well on Orencia 125 mg subcutaneous injections once weekly as monotherapy.  His morning stiffness continues to last for about 1 hour daily.  He has occasional internal pain in the left knee which is scheduled to be replaced with Dr. Durwin Nora next week.  He  is holding his Orencia currently and is aware that he will require clearance by Dr. Durwin Nora prior to resuming Orencia as prescribed. No medication changes will be made at this time.  He was advised to notify us if he develops signs or symptoms of a flare.  He will follow-up in the office in 5 months or sooner if needed.  High risk medications (not anticoagulants) long-term use - Orencia 125 mg sq injections once weekly.  CBC and CMP updated on 10/02/22.  His next lab work will be due in mid May and every 3 months to monitor for drug toxicity.  Future orders were placed today.  He is considering having updated lab work at his PCPs office closer to home if possible. TB gold negative on 10/02/22.  No recent or recurrent infections.  Advised patient to hold orencia if he develops signs or symptoms of an infection and to resume once the infection has completely cleared.   - Plan: CBC with Differential/Platelet, COMPLETE METABOLIC PANEL WITH GFR  Status post replacement of both shoulder joints - Reverse RTSR by Dr. Ranell Patrick on 10/14/21.RLTSR May 13, 2022.  Doing well.  Good range of motion with no discomfort currently.  Primary osteoarthritis of both hands: He has PIP and DIP thickening consistent with osteoarthritis of both hands.  No tenderness or synovitis noted.  Complete fist formation bilaterally.  Primary osteoarthritis of both knees: Chronic pain.  Under the care of Dr. Durwin Nora.  He has been experiencing some increased instability and occasional nocturnal pain in the left knee.  He is scheduled for a left knee replacement next week.  He is holding his Orencia this week and is aware that he will require clearance by Dr. Durwin Nora prior to resuming Orencia as prescribed.  Primary osteoarthritis of both feet: He is not experiencing any discomfort in his feet at this time.  He has good range of motion of both ankle joints with no tenderness or swelling.  Other medical conditions are listed as follows:    S/P coil embolization of cerebral aneurysm  Latent syphilis  History of alcohol abuse  Orders: Orders Placed This Encounter  Procedures   CBC with Differential/Platelet   COMPLETE METABOLIC PANEL WITH GFR   No  orders of the defined types were placed in this encounter.    Follow-Up Instructions: Return in about 5 months (around 05/08/2023) for Rheumatoid arthritis, Osteoarthritis.   Gearldine Bienenstock, PA-C  Note - This record has been created using Dragon software.  Chart creation errors have been sought, but may not always  have been located. Such creation errors do not reflect on  the standard of medical care.

## 2022-11-23 ENCOUNTER — Other Ambulatory Visit (HOSPITAL_COMMUNITY): Payer: Self-pay

## 2022-11-24 ENCOUNTER — Other Ambulatory Visit (HOSPITAL_COMMUNITY): Payer: Self-pay

## 2022-11-27 ENCOUNTER — Other Ambulatory Visit: Payer: Self-pay | Admitting: Nurse Practitioner

## 2022-11-28 DIAGNOSIS — M13861 Other specified arthritis, right knee: Secondary | ICD-10-CM | POA: Diagnosis not present

## 2022-12-06 ENCOUNTER — Encounter: Payer: Self-pay | Admitting: Physician Assistant

## 2022-12-06 ENCOUNTER — Ambulatory Visit: Payer: Medicare Other | Attending: Physician Assistant | Admitting: Physician Assistant

## 2022-12-06 VITALS — BP 147/76 | HR 64 | Resp 17 | Ht 67.0 in | Wt 182.2 lb

## 2022-12-06 DIAGNOSIS — A530 Latent syphilis, unspecified as early or late: Secondary | ICD-10-CM

## 2022-12-06 DIAGNOSIS — M170 Bilateral primary osteoarthritis of knee: Secondary | ICD-10-CM

## 2022-12-06 DIAGNOSIS — M19071 Primary osteoarthritis, right ankle and foot: Secondary | ICD-10-CM | POA: Diagnosis not present

## 2022-12-06 DIAGNOSIS — Z79899 Other long term (current) drug therapy: Secondary | ICD-10-CM

## 2022-12-06 DIAGNOSIS — M19042 Primary osteoarthritis, left hand: Secondary | ICD-10-CM | POA: Diagnosis not present

## 2022-12-06 DIAGNOSIS — M19041 Primary osteoarthritis, right hand: Secondary | ICD-10-CM | POA: Diagnosis not present

## 2022-12-06 DIAGNOSIS — Z96611 Presence of right artificial shoulder joint: Secondary | ICD-10-CM | POA: Diagnosis not present

## 2022-12-06 DIAGNOSIS — F1011 Alcohol abuse, in remission: Secondary | ICD-10-CM

## 2022-12-06 DIAGNOSIS — M0579 Rheumatoid arthritis with rheumatoid factor of multiple sites without organ or systems involvement: Secondary | ICD-10-CM

## 2022-12-06 DIAGNOSIS — M19072 Primary osteoarthritis, left ankle and foot: Secondary | ICD-10-CM | POA: Diagnosis not present

## 2022-12-06 DIAGNOSIS — Z9889 Other specified postprocedural states: Secondary | ICD-10-CM

## 2022-12-06 DIAGNOSIS — Z96612 Presence of left artificial shoulder joint: Secondary | ICD-10-CM | POA: Diagnosis not present

## 2022-12-06 NOTE — Patient Instructions (Signed)
Standing Labs We placed an order today for your standing lab work.   Please have your standing labs drawn in mid-May and every 3 months   Please have your labs drawn 2 weeks prior to your appointment so that the provider can discuss your lab results at your appointment, if possible.  Please note that you may see your imaging and lab results in MyChart before we have reviewed them. We will contact you once all results are reviewed. Please allow our office up to 72 hours to thoroughly review all of the results before contacting the office for clarification of your results.  WALK-IN LAB HOURS  Monday through Thursday from 8:00 am -12:30 pm and 1:00 pm-5:00 pm and Friday from 8:00 am-12:00 pm.  Patients with office visits requiring labs will be seen before walk-in labs.  You may encounter longer than normal wait times. Please allow additional time. Wait times may be shorter on  Monday and Thursday afternoons.  We do not book appointments for walk-in labs. We appreciate your patience and understanding with our staff.   Labs are drawn by [facility]. Please bring your co-pay at the time of your lab draw.  You may receive a bill from [facility] for your lab work.  Please note if you are on Hydroxychloroquine and and an order has been placed for a Hydroxychloroquine level,  you will need to have it drawn 4 hours or more after your last dose.  If you wish to have your labs drawn at another location, please call the office 24 hours in advance so we can fax the orders.  The office is located at [address], [place], [place] [address]   If you have any questions regarding directions or hours of operation,  please call [phone].   As a reminder, please drink plenty of water prior to coming for your lab work. Thanks!

## 2022-12-11 DIAGNOSIS — M1712 Unilateral primary osteoarthritis, left knee: Secondary | ICD-10-CM | POA: Diagnosis not present

## 2022-12-11 DIAGNOSIS — Z96652 Presence of left artificial knee joint: Secondary | ICD-10-CM | POA: Diagnosis not present

## 2022-12-11 DIAGNOSIS — G8918 Other acute postprocedural pain: Secondary | ICD-10-CM | POA: Diagnosis not present

## 2022-12-13 ENCOUNTER — Other Ambulatory Visit: Payer: Self-pay

## 2022-12-13 ENCOUNTER — Other Ambulatory Visit (HOSPITAL_COMMUNITY): Payer: Self-pay

## 2022-12-13 ENCOUNTER — Ambulatory Visit: Payer: Medicare Other | Attending: Physician Assistant | Admitting: Physical Therapy

## 2022-12-13 ENCOUNTER — Encounter: Payer: Self-pay | Admitting: Physical Therapy

## 2022-12-13 DIAGNOSIS — G8929 Other chronic pain: Secondary | ICD-10-CM | POA: Diagnosis not present

## 2022-12-13 DIAGNOSIS — M25562 Pain in left knee: Secondary | ICD-10-CM | POA: Insufficient documentation

## 2022-12-13 DIAGNOSIS — M6281 Muscle weakness (generalized): Secondary | ICD-10-CM | POA: Insufficient documentation

## 2022-12-13 DIAGNOSIS — M25662 Stiffness of left knee, not elsewhere classified: Secondary | ICD-10-CM | POA: Insufficient documentation

## 2022-12-13 NOTE — Therapy (Signed)
OUTPATIENT PHYSICAL THERAPY LOWER EXTREMITY EVALUATION   Patient Name: Daniel Griffin MRN: [id number] DOB:February 26, 1961, 62 y.o., male Today's Date: 12/13/2022  END OF SESSION:  PT End of Session - 12/13/22 0928     Visit Number 1    Number of Visits 12    Date for PT Re-Evaluation 01/10/23    Authorization Type FOTO AT LEAST EVERY 5TH VISIT.  PROGRESS NOTE AT 10TH VISIT.  KX MODIFIER AFTER 15 VISITS.    PT Start Time 0900    PT Stop Time 0946    PT Time Calculation (min) 46 min    Activity Tolerance Patient tolerated treatment well    Behavior During Therapy WFL for tasks assessed/performed             Past Medical History:  Diagnosis Date   Arthritis    RA   Depression    Seizures 06/21/2020   ETOH w/d related   Past Surgical History:  Procedure Laterality Date   ANKLE FRACTURE SURGERY     Left   APPENDECTOMY     IR 3D INDEPENDENT WKST  09/01/2021   IR 3D INDEPENDENT WKST  12/14/2021   IR ANGIO INTRA EXTRACRAN SEL INTERNAL CAROTID BILAT MOD SED  09/01/2021   IR ANGIO INTRA EXTRACRAN SEL INTERNAL CAROTID UNI R MOD SED  12/14/2021   IR ANGIO VERTEBRAL SEL VERTEBRAL BILAT MOD SED  09/01/2021   IR ANGIOGRAM FOLLOW UP STUDY  12/14/2021   IR ANGIOGRAM FOLLOW UP STUDY  12/14/2021   IR ANGIOGRAM FOLLOW UP STUDY  12/14/2021   IR CT HEAD LTD  12/14/2021   IR TRANSCATH/EMBOLIZ  12/14/2021   IR US GUIDE VASC ACCESS RIGHT  09/01/2021   IR US GUIDE VASC ACCESS RIGHT  12/14/2021   RADIOLOGY WITH ANESTHESIA N/A 12/14/2021   Procedure: IR WITH ANESTHESIA EMBOLIZATION;  Surgeon: Baldemar Lenis, MD;  Location: [facility] OR;  Service: Radiology;  Laterality: N/A;   REVERSE SHOULDER ARTHROPLASTY Left 05/13/2021   Procedure: REVERSE SHOULDER ARTHROPLASTY;  Surgeon: Beverely Low, MD;  Location: [facility] ORS;  Service: Orthopedics;  Laterality: Left;  with ISB   REVERSE SHOULDER ARTHROPLASTY Right 10/14/2021   Procedure: REVERSE SHOULDER ARTHROPLASTY;  Surgeon: Beverely Low, MD;  Location: [facility]  ORS;  Service: Orthopedics;  Laterality: Right;  with ISB   Patient Active Problem List   Diagnosis Date Noted   Body mass index 30.0-30.9, adult 08/03/2022   Brain aneurysm 12/14/2021   H/O total shoulder replacement, 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  THERAPY DIAG:  Chronic pain of left knee - Plan: PT plan of care cert/re-cert  Stiffness of left knee, not elsewhere classified - Plan: PT plan of care cert/re-cert  Muscle weakness (generalized) - Plan: PT plan of care cert/re-cert  Rationale for Evaluation and Treatment: Rehabilitation  ONSET DATE: Surgery, 12/11/22.  SUBJECTIVE:   SUBJECTIVE STATEMENT: The patient presents to the clinic s/p left total knee replacement performed on 12/13/22.  He states he is compliant to his HEP.  His post-surgical bulky dressing is intact with ACE wrap and TED hose donned.  He rates his pain at a 10/10.  He is using a FWW for safe ambulation.    PERTINENT HISTORY: RA, bilateral TSA's. PAIN:  Are you having pain?  Yes: NPRS scale: 10/10 Pain location: Left knee. Pain description: Ache, throb. Aggravating factors: Movement. Relieving factors: Rest, ice.  PRECAUTIONS: Other: No ultrasound.  WEIGHT BEARING RESTRICTIONS: No  FALLS:  Has patient fallen in last 6 months? No.  LIVING ENVIRONMENT: Lives with: lives alone Lives in: House/apartment Stairs:  Has a ramp. Has following equipment at home: Dan Humphreys - 2 wheeled  OCCUPATION: Retired.  PLOF: Independent  PATIENT GOALS: Not have left knee pain.   OBJECTIVE:    PATIENT SURVEYS:  FOTO 28.  EDEMA:  Bulky dressing and ACE wrap donned.  PALPATION: C/o diffuse left knee pain.  LOWER EXTREMITY ROM: -5  degrees of left knee extension and flexion to 70 degrees.  LOWER EXTREMITY MMT:  Patient currently unable to perform a left SLR and SAQ.  TRANSFERS:  Sit to supine and supine to sit with patient using right LE to assist left LE.   GAIT: Step-to gait pattern with a FWW.  TODAY'S TREATMENT:                                                                                                                              DATE: LE elevation with vasopnuematic on low to patient's left LE.       PATIENT EDUCATION:  Education details: Reviewed HEP. Person educated: Patient Education method: Explanation Education comprehension: verbalized understanding  HOME EXERCISE PROGRAM:   ASSESSMENT:  CLINICAL IMPRESSION: The patient presents to OPPT s/p left total knee replacement performed on 12/11/22.  The patient has hi post-surgical bulky dressing donned as well as an ACE wrap and TED hose.  He is allowed to remove dressing in the next day or two.  He is currently reporting a very high pain-level.  His flexion is currently limited to 70 degrees.  He is unable to perform an antigravity SLR and SAQ at this time.  He is walking safely with a FWW with s step-to gait pattern. Patient will benefit from skilled physical therapy intervention to address pain and deficits.  OBJECTIVE IMPAIRMENTS: Abnormal gait, decreased activity tolerance, difficulty walking, decreased ROM, decreased strength, and pain.   ACTIVITY LIMITATIONS: carrying, lifting, and locomotion level  PARTICIPATION LIMITATIONS: meal prep, cleaning, laundry, and yard work  PERSONAL FACTORS: 1 comorbidity: RA  are also affecting patient's functional outcome.   REHAB POTENTIAL: Excellent  CLINICAL DECISION MAKING: Stable/uncomplicated  EVALUATION COMPLEXITY: Low   GOALS: SHORT TERM GOALS: Target date: 12/27/22  Ind with an initial HEP Goal status: INITIAL  2.  Full active left knee extension.  Goal status: INITIAL  3.  Active left  knee flexion to 90 degrees.  Goal status: INITIAL  LONG TERM GOALS: Target date: 03/13/23.  Ind with an advanced HEP.  Goal status: INITIAL  2.  Active left knee flexion to 115 degrees+ so the patient can perform functional tasks and do so with pain not > 2-3/10.  Goal status: INITIAL  3.  Increase left hip and knee strength  to a solid 4+/5 to provide good stability for accomplishment of functional activities.  Goal status: INITIAL  4.  Perform a reciprocating stair gait with one railing with pain not > 2-3/10.  Goal status: INITIAL   PLAN:  PT FREQUENCY: 3x/week  PT DURATION: 4 weeks  PLANNED INTERVENTIONS: Therapeutic exercises, Therapeutic activity, Neuromuscular re-education, Gait training, Patient/Family education, Self Care, Stair training, Electrical stimulation, Cryotherapy, Moist heat, Vasopneumatic device, and Manual therapy  PLAN FOR NEXT SESSION: Nustep.  Progress into TKA protocol.  Vasopneumatic with LE elevation.   Hidaya Daniel, [place], PT 12/13/2022, 10:15 AM

## 2022-12-13 NOTE — Addendum Note (Signed)
Addended by: Bali Lyn, [place] W on: 12/13/2022 02:30 PM   Modules accepted: Orders

## 2022-12-18 ENCOUNTER — Ambulatory Visit: Payer: Medicare Other | Admitting: Physical Therapy

## 2022-12-18 ENCOUNTER — Other Ambulatory Visit (HOSPITAL_COMMUNITY): Payer: Self-pay

## 2022-12-18 DIAGNOSIS — M6281 Muscle weakness (generalized): Secondary | ICD-10-CM

## 2022-12-18 DIAGNOSIS — M25662 Stiffness of left knee, not elsewhere classified: Secondary | ICD-10-CM | POA: Diagnosis not present

## 2022-12-18 DIAGNOSIS — G8929 Other chronic pain: Secondary | ICD-10-CM

## 2022-12-18 DIAGNOSIS — M25562 Pain in left knee: Secondary | ICD-10-CM | POA: Diagnosis not present

## 2022-12-18 NOTE — Therapy (Signed)
OUTPATIENT PHYSICAL THERAPY LOWER EXTREMITY EVALUATION   Patient Name: Daniel Griffin MRN: [id number] DOB:Mar 29, 1961, 62 y.o., male Today's Date: 12/18/2022  END OF SESSION:  PT End of Session - 12/18/22 0932     Visit Number 2    Number of Visits 12    Date for PT Re-Evaluation 01/10/23    Authorization Type FOTO AT LEAST EVERY 5TH VISIT.  PROGRESS NOTE AT 10TH VISIT.  KX MODIFIER AFTER 15 VISITS.    PT Start Time 0900    PT Stop Time 0948    PT Time Calculation (min) 48 min    Activity Tolerance Patient tolerated treatment well    Behavior During Therapy WFL for tasks assessed/performed             Past Medical History:  Diagnosis Date   Arthritis    RA   Depression    Seizures (HCC) 06/21/2020   ETOH w/d related   Past Surgical History:  Procedure Laterality Date   ANKLE FRACTURE SURGERY     Left   APPENDECTOMY     IR 3D INDEPENDENT WKST  09/01/2021   IR 3D INDEPENDENT WKST  12/14/2021   IR ANGIO INTRA EXTRACRAN SEL INTERNAL CAROTID BILAT MOD SED  09/01/2021   IR ANGIO INTRA EXTRACRAN SEL INTERNAL CAROTID UNI R MOD SED  12/14/2021   IR ANGIO VERTEBRAL SEL VERTEBRAL BILAT MOD SED  09/01/2021   IR ANGIOGRAM FOLLOW UP STUDY  12/14/2021   IR ANGIOGRAM FOLLOW UP STUDY  12/14/2021   IR ANGIOGRAM FOLLOW UP STUDY  12/14/2021   IR CT HEAD LTD  12/14/2021   IR TRANSCATH/EMBOLIZ  12/14/2021   IR US GUIDE VASC ACCESS RIGHT  09/01/2021   IR US GUIDE VASC ACCESS RIGHT  12/14/2021   RADIOLOGY WITH ANESTHESIA N/A 12/14/2021   Procedure: IR WITH ANESTHESIA EMBOLIZATION;  Surgeon: Baldemar Lenis, MD;  Location: [facility] OR;  Service: Radiology;  Laterality: N/A;   REVERSE SHOULDER ARTHROPLASTY Left 05/13/2021   Procedure: REVERSE SHOULDER ARTHROPLASTY;  Surgeon: Beverely Low, MD;  Location: [facility];  Service: Orthopedics;  Laterality: Left;  with ISB   REVERSE SHOULDER ARTHROPLASTY Right 10/14/2021   Procedure: REVERSE SHOULDER ARTHROPLASTY;  Surgeon: Beverely Low, MD;   Location: [facility];  Service: Orthopedics;  Laterality: Right;  with ISB   Patient Active Problem List   Diagnosis Date Noted   Body mass index 30.0-30.9, adult 08/03/2022   Brain aneurysm 12/14/2021   H/O total shoulder replacement, 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  THERAPY DIAG:  Chronic pain of left knee  Stiffness of left knee, not elsewhere classified  Muscle weakness (generalized)  Rationale for Evaluation and Treatment: Rehabilitation  ONSET DATE: Surgery, 12/11/22.  SUBJECTIVE:   SUBJECTIVE STATEMENT: Been doing the home exercises.  PERTINENT HISTORY: RA, bilateral TSA's. PAIN:  Are you having pain? Yes: NPRS scale: 6/10 Pain location: Left knee. Pain description: Ache, throb. Aggravating factors: Movement. Relieving factors: Rest, ice.  PRECAUTIONS: Other: No ultrasound.  WEIGHT BEARING RESTRICTIONS: No  FALLS:  Has patient fallen in last 6 months? No.  LIVING ENVIRONMENT: Lives with: lives alone Lives in: House/apartment Stairs:  Has a ramp. Has following equipment at home: Dan Humphreys - 2 wheeled  OCCUPATION: Retired.  PLOF:  Independent  PATIENT GOALS: Not have left knee pain.   OBJECTIVE:    PATIENT SURVEYS:  FOTO 28.  EDEMA:  Bulky dressing and ACE wrap donned.  PALPATION: C/o diffuse left knee pain.  LOWER EXTREMITY ROM: -5 degrees of left knee extension and flexion to 70 degrees.  LOWER EXTREMITY MMT:  Patient currently unable to perform a left SLR and SAQ.  TRANSFERS:  Sit to supine and supine to sit with patient using right LE to assist left LE.   GAIT: Step-to gait pattern with a FWW.  TODAY'S TREATMENT:                                                                                                                               DATE:                                    EXERCISE LOG  Exercise Repetitions and Resistance Comments  Nustep level 15 minutes, moving seat forward x 1 to increase flexion.                   In supine:  Gentle low load long duration stretching x 9 minutes into left knee flexion and extension x 9 minutes f/b  LE elevation with vasopnuematic on low to patient's left LE.       PATIENT EDUCATION:  Education details: Reviewed HEP. Person educated: Patient Education method: Explanation Education comprehension: verbalized understanding  HOME EXERCISE PROGRAM:   ASSESSMENT:  CLINICAL IMPRESSION: Patient very motivated and is compliant to his HEP. He did great with treatment today and tolerated without complaint.  OBJECTIVE IMPAIRMENTS: Abnormal gait, decreased activity tolerance, difficulty walking, decreased ROM, decreased strength, and pain.   ACTIVITY LIMITATIONS: carrying, lifting, and locomotion level  PARTICIPATION LIMITATIONS: meal prep, cleaning, laundry, and yard work  PERSONAL FACTORS: 1 comorbidity: RA  are also affecting patient's functional outcome.   REHAB POTENTIAL: Excellent  CLINICAL DECISION MAKING: Stable/uncomplicated  EVALUATION COMPLEXITY: Low   GOALS: SHORT TERM GOALS: Target date: 12/27/22  Ind with an initial HEP Goal status: INITIAL  2.  Full active left knee extension.  Goal status: INITIAL  3.  Active left knee flexion to 90 degrees.  Goal status: INITIAL  LONG TERM GOALS: Target date: 03/13/23.  Ind with an advanced HEP.  Goal status: INITIAL  2.  Active left knee flexion to 115 degrees+ so the patient can perform functional tasks and do so with pain not > 2-3/10.  Goal status: INITIAL  3.  Increase left hip and knee strength to a solid 4+/5 to provide good stability for accomplishment of functional activities.  Goal status: INITIAL  4.  Perform a  reciprocating stair gait with one railing with pain not > 2-3/10.  Goal status: INITIAL   PLAN:  PT FREQUENCY: 3x/week  PT DURATION: 4 weeks  PLANNED INTERVENTIONS: Therapeutic exercises, Therapeutic  activity, Neuromuscular re-education, Gait training, Patient/Family education, Self Care, Stair training, Electrical stimulation, Cryotherapy, Moist heat, Vasopneumatic device, and Manual therapy  PLAN FOR NEXT SESSION: Nustep.  Progress into TKA protocol.  Vasopneumatic with LE elevation.   Asah Lamay, [place], PT 12/18/2022, 11:06 AM

## 2022-12-20 ENCOUNTER — Other Ambulatory Visit (HOSPITAL_COMMUNITY): Payer: Self-pay

## 2022-12-20 ENCOUNTER — Ambulatory Visit: Payer: Medicare Other | Attending: Physician Assistant

## 2022-12-20 DIAGNOSIS — M6281 Muscle weakness (generalized): Secondary | ICD-10-CM

## 2022-12-20 DIAGNOSIS — M25562 Pain in left knee: Secondary | ICD-10-CM

## 2022-12-20 DIAGNOSIS — M25662 Stiffness of left knee, not elsewhere classified: Secondary | ICD-10-CM | POA: Diagnosis not present

## 2022-12-20 DIAGNOSIS — G8929 Other chronic pain: Secondary | ICD-10-CM | POA: Diagnosis not present

## 2022-12-20 DIAGNOSIS — Z4789 Encounter for other orthopedic aftercare: Secondary | ICD-10-CM | POA: Diagnosis not present

## 2022-12-20 NOTE — Therapy (Signed)
OUTPATIENT PHYSICAL THERAPY LOWER EXTREMITY TREATMENT   Patient Name: Daniel Griffin MRN: [id number] DOB:[birth year], 62 y.o., male Today's Date: 12/20/2022  END OF SESSION:  PT End of Session - 12/20/22 1348     Visit Number 3    Number of Visits 12    Date for PT Re-Evaluation 01/10/23    Authorization Type FOTO AT LEAST EVERY 5TH VISIT.  PROGRESS NOTE AT 10TH VISIT.  KX MODIFIER AFTER 15 VISITS.    PT Start Time 1345    PT Stop Time 1434    PT Time Calculation (min) 49 min    Activity Tolerance Patient tolerated treatment well    Behavior During Therapy WFL for tasks assessed/performed             Past Medical History:  Diagnosis Date   Arthritis    RA   Depression    Seizures (HCC) 06/21/2020   ETOH w/d related   Past Surgical History:  Procedure Laterality Date   ANKLE FRACTURE SURGERY     Left   APPENDECTOMY     IR 3D INDEPENDENT WKST  09/01/2021   IR 3D INDEPENDENT WKST  12/14/2021   IR ANGIO INTRA EXTRACRAN SEL INTERNAL CAROTID BILAT MOD SED  09/01/2021   IR ANGIO INTRA EXTRACRAN SEL INTERNAL CAROTID UNI R MOD SED  12/14/2021   IR ANGIO VERTEBRAL SEL VERTEBRAL BILAT MOD SED  09/01/2021   IR ANGIOGRAM FOLLOW UP STUDY  12/14/2021   IR ANGIOGRAM FOLLOW UP STUDY  12/14/2021   IR ANGIOGRAM FOLLOW UP STUDY  12/14/2021   IR CT HEAD LTD  12/14/2021   IR TRANSCATH/EMBOLIZ  12/14/2021   IR US GUIDE VASC ACCESS RIGHT  09/01/2021   IR US GUIDE VASC ACCESS RIGHT  12/14/2021   RADIOLOGY WITH ANESTHESIA N/A 12/14/2021   Procedure: IR WITH ANESTHESIA EMBOLIZATION;  Surgeon: Baldemar Lenis, MD;  Location: [facility] OR;  Service: Radiology;  Laterality: N/A;   REVERSE SHOULDER ARTHROPLASTY Left 05/13/2021   Procedure: REVERSE SHOULDER ARTHROPLASTY;  Surgeon: Beverely Low, MD;  Location: [facility];  Service: Orthopedics;  Laterality: Left;  with ISB   REVERSE SHOULDER ARTHROPLASTY Right 10/14/2021   Procedure: REVERSE SHOULDER ARTHROPLASTY;  Surgeon: Beverely Low, MD;  Location:  [facility];  Service: Orthopedics;  Laterality: Right;  with ISB   Patient Active Problem List   Diagnosis Date Noted   Body mass index 30.0-30.9, adult 08/03/2022   Brain aneurysm 12/14/2021   H/O total shoulder replacement, 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  THERAPY DIAG:  Chronic pain of left knee  Stiffness of left knee, not elsewhere classified  Muscle weakness (generalized)  Rationale for Evaluation and Treatment: Rehabilitation  ONSET DATE: Surgery, 12/11/22.  SUBJECTIVE:   SUBJECTIVE STATEMENT:  Pt reports knee feels a little better every day.  Pt went to MD today and he is pleased with progress, removing aquacel.  PERTINENT HISTORY: RA, bilateral TSA's. PAIN:  Are you having pain? Yes: NPRS scale: 5/10 Pain location: Left knee. Pain description: Ache, throb. Aggravating factors: Movement. Relieving factors: Rest, ice.  PRECAUTIONS: Other: No ultrasound.  WEIGHT BEARING RESTRICTIONS: No  FALLS:  Has patient fallen in last 6 months? No.  LIVING ENVIRONMENT: Lives with: lives alone Lives in: House/apartment  Stairs:  Has a ramp. Has following equipment at home: Dan Humphreys - 2 wheeled  OCCUPATION: Retired.  PLOF: Independent  PATIENT GOALS: Not have left knee pain.   OBJECTIVE:    PATIENT SURVEYS:  FOTO 28.  EDEMA:  Bulky dressing and ACE wrap donned.  PALPATION: C/o diffuse left knee pain.  LOWER EXTREMITY ROM: -5 degrees of left knee extension and flexion to 70 degrees.  LOWER EXTREMITY MMT:  Patient currently unable to perform a left SLR and SAQ.  TRANSFERS:  Sit to supine and supine to sit with patient using right LE to assist left LE.   GAIT: Step-to  gait pattern with a FWW.  TODAY'S TREATMENT:                                                                                                                              DATE:                                    EXERCISE LOG  Exercise Repetitions and Resistance Comments  Nustep Lvl 3 x 17 mins; seat 10-8   Heel/Toe Raises    Lunges    LAQs x10 reps   Seated Marches x10 reps   Heel Slides X10 reps   Ball Squeeze X10 reps   Clams X10 reps    Modalities  Date:  Unattended Estim: Knee, 80-150 hz, 15 mins, Pain Vaso: Knee, 34 degrees, low pressure, 15 mins, Pain and Tone   PATIENT EDUCATION:  Education details: Reviewed HEP. Person educated: Patient Education method: Explanation Education comprehension: verbalized understanding  HOME EXERCISE PROGRAM:   ASSESSMENT:  CLINICAL IMPRESSION: Pt arrives for today's treatment session reporting 5/10 left knee pain.  Pt able to progress from seat 10 to seat 8 on Nustep today with minimal pain and discomfort.  Pt instructed in seated left LE exercises to increased strength, function, and ROM.  Pt grimacing throughout exercises, but able to perform all reps asked of him.  Normal responses to estim and vaso noted upon removal.  Pt reported 3/10 left knee pain at completion of today's treatment session.  OBJECTIVE IMPAIRMENTS: Abnormal gait, decreased activity tolerance, difficulty walking, decreased ROM, decreased strength, and pain.   ACTIVITY LIMITATIONS: carrying, lifting, and locomotion level  PARTICIPATION LIMITATIONS: meal prep, cleaning, laundry, and yard work  PERSONAL FACTORS: 1 comorbidity: RA  are also affecting patient's functional outcome.   REHAB POTENTIAL: Excellent  CLINICAL DECISION MAKING: Stable/uncomplicated  EVALUATION COMPLEXITY: Low   GOALS: SHORT TERM GOALS: Target date: 12/27/22  Ind with an initial HEP Goal status: INITIAL  2.  Full active left knee extension.  Goal status: INITIAL  3.  Active left  knee flexion to 90 degrees.  Goal status: INITIAL  LONG TERM GOALS: Target date: 03/13/23.  Ind with an advanced HEP.  Goal status: INITIAL  2.  Active left knee flexion to  115 degrees+ so the patient can perform functional tasks and do so with pain not > 2-3/10.  Goal status: INITIAL  3.  Increase left hip and knee strength to a solid 4+/5 to provide good stability for accomplishment of functional activities.  Goal status: INITIAL  4.  Perform a reciprocating stair gait with one railing with pain not > 2-3/10.  Goal status: INITIAL   PLAN:  PT FREQUENCY: 3x/week  PT DURATION: 4 weeks  PLANNED INTERVENTIONS: Therapeutic exercises, Therapeutic activity, Neuromuscular re-education, Gait training, Patient/Family education, Self Care, Stair training, Electrical stimulation, Cryotherapy, Moist heat, Vasopneumatic device, and Manual therapy  PLAN FOR NEXT SESSION: Nustep.  Progress into TKA protocol.  Vasopneumatic with LE elevation.   Newman Pies, PTA 12/20/2022, 2:35 PM

## 2022-12-25 ENCOUNTER — Ambulatory Visit: Payer: Medicare Other

## 2022-12-25 DIAGNOSIS — M25662 Stiffness of left knee, not elsewhere classified: Secondary | ICD-10-CM | POA: Diagnosis not present

## 2022-12-25 DIAGNOSIS — M25562 Pain in left knee: Secondary | ICD-10-CM | POA: Diagnosis not present

## 2022-12-25 DIAGNOSIS — G8929 Other chronic pain: Secondary | ICD-10-CM | POA: Diagnosis not present

## 2022-12-25 DIAGNOSIS — M6281 Muscle weakness (generalized): Secondary | ICD-10-CM | POA: Diagnosis not present

## 2022-12-25 NOTE — Therapy (Signed)
OUTPATIENT PHYSICAL THERAPY LOWER EXTREMITY TREATMENT   Patient Name: GERSON ARMELIN MRN: [id number] DOB:May 04, 1961, 62 y.o., male Today's Date: 12/25/2022  END OF SESSION:  PT End of Session - 12/25/22 0901     Visit Number 4    Number of Visits 12    Date for PT Re-Evaluation 01/10/23    Authorization Type FOTO AT LEAST EVERY 5TH VISIT.  PROGRESS NOTE AT 10TH VISIT.  KX MODIFIER AFTER 15 VISITS.    PT Start Time 0900    PT Stop Time 0945    PT Time Calculation (min) 45 min    Activity Tolerance Patient tolerated treatment well    Behavior During Therapy WFL for tasks assessed/performed             Past Medical History:  Diagnosis Date   Arthritis    RA   Depression    Seizures (HCC) 06/21/2020   ETOH w/d related   Past Surgical History:  Procedure Laterality Date   ANKLE FRACTURE SURGERY     Left   APPENDECTOMY     IR 3D INDEPENDENT WKST  09/01/2021   IR 3D INDEPENDENT WKST  12/14/2021   IR ANGIO INTRA EXTRACRAN SEL INTERNAL CAROTID BILAT MOD SED  09/01/2021   IR ANGIO INTRA EXTRACRAN SEL INTERNAL CAROTID UNI R MOD SED  12/14/2021   IR ANGIO VERTEBRAL SEL VERTEBRAL BILAT MOD SED  09/01/2021   IR ANGIOGRAM FOLLOW UP STUDY  12/14/2021   IR ANGIOGRAM FOLLOW UP STUDY  12/14/2021   IR ANGIOGRAM FOLLOW UP STUDY  12/14/2021   IR CT HEAD LTD  12/14/2021   IR TRANSCATH/EMBOLIZ  12/14/2021   IR US GUIDE VASC ACCESS RIGHT  09/01/2021   IR US GUIDE VASC ACCESS RIGHT  12/14/2021   RADIOLOGY WITH ANESTHESIA N/A 12/14/2021   Procedure: IR WITH ANESTHESIA EMBOLIZATION;  Surgeon: Baldemar Lenis, MD;  Location: [facility] OR;  Service: Radiology;  Laterality: N/A;   REVERSE SHOULDER ARTHROPLASTY Left 05/13/2021   Procedure: REVERSE SHOULDER ARTHROPLASTY;  Surgeon: Beverely Low, MD;  Location: [facility];  Service: Orthopedics;  Laterality: Left;  with ISB   REVERSE SHOULDER ARTHROPLASTY Right 10/14/2021   Procedure: REVERSE SHOULDER ARTHROPLASTY;  Surgeon: Beverely Low, MD;  Location:  [facility];  Service: Orthopedics;  Laterality: Right;  with ISB   Patient Active Problem List   Diagnosis Date Noted   Body mass index 30.0-30.9, adult 08/03/2022   Brain aneurysm 12/14/2021   H/O total shoulder replacement, 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  THERAPY DIAG:  Chronic pain of left knee  Stiffness of left knee, not elsewhere classified  Muscle weakness (generalized)  Rationale for Evaluation and Treatment: Rehabilitation  ONSET DATE: Surgery, 12/11/22.  SUBJECTIVE:   SUBJECTIVE STATEMENT:  Pt denies any pain today, but reports feeling stiff and uncomfortable.   PERTINENT HISTORY: RA, bilateral TSA's. PAIN:  Are you having pain? No  PRECAUTIONS: Other: No ultrasound.  WEIGHT BEARING RESTRICTIONS: No  FALLS:  Has patient fallen in last 6 months? No.  LIVING ENVIRONMENT: Lives with: lives alone Lives in: House/apartment Stairs:  Has a ramp. Has following equipment at home: Dan Humphreys - 2 wheeled  OCCUPATION: Retired.  PLOF: Independent  PATIENT GOALS: Not have left knee pain.  OBJECTIVE:    PATIENT SURVEYS:  FOTO 28.  EDEMA:  Bulky dressing and ACE wrap donned.  PALPATION: C/o diffuse left knee pain.  LOWER EXTREMITY ROM: -5 degrees of left knee extension and flexion to 70 degrees.  LOWER EXTREMITY MMT:  Patient currently unable to perform a left SLR and SAQ.  TRANSFERS:  Sit to supine and supine to sit with patient using right LE to assist left LE.   GAIT: Step-to gait pattern with a FWW.  TODAY'S TREATMENT:                                                                                                                              DATE:                                     EXERCISE LOG  Exercise Repetitions and Resistance Comments  Nustep Lvl 3 x 17 mins; seat 8-6   Heel/Toe Raises    Lunges    LAQs x15 reps   Seated Marches x15 reps   Heel Slides x15 reps   Ball Squeeze x15 reps   Clams x15 reps    Modalities  Date:  Unattended Estim: Knee, 80-150 hz, 15 mins, Pain Vaso: Knee, 34 degrees, low pressure, 15 mins, Pain and Tone   PATIENT EDUCATION:  Education details: Reviewed HEP. Person educated: Patient Education method: Explanation Education comprehension: verbalized understanding  HOME EXERCISE PROGRAM:   ASSESSMENT:  CLINICAL IMPRESSION: Pt arrives for today's treatment session denying any pain, but reports left knee stiffness.  Pt able to progress to seat 6 today on the Nustep with minimal discomfort.  Pt able to tolerate increased reps with all exercises today with fatigue noted with last few reps of each exercise.  Normal responses to estim and vaso noted upon removal.  Pt denied any pain at completion of today's treatment session.  OBJECTIVE IMPAIRMENTS: Abnormal gait, decreased activity tolerance, difficulty walking, decreased ROM, decreased strength, and pain.   ACTIVITY LIMITATIONS: carrying, lifting, and locomotion level  PARTICIPATION LIMITATIONS: meal prep, cleaning, laundry, and yard work  PERSONAL FACTORS: 1 comorbidity: RA  are also affecting patient's functional outcome.   REHAB POTENTIAL: Excellent  CLINICAL DECISION MAKING: Stable/uncomplicated  EVALUATION COMPLEXITY: Low   GOALS: SHORT TERM GOALS: Target date: 12/27/22  Ind with an initial HEP Goal status: INITIAL  2.  Full active left knee extension.  Goal status: INITIAL  3.  Active left knee flexion to 90 degrees.  Goal status: INITIAL  LONG TERM GOALS: Target date: 03/13/23.  Ind with an advanced HEP.  Goal status: INITIAL  2.  Active left knee flexion to 115 degrees+ so the patient can perform functional tasks and do so with  pain not > 2-3/10.  Goal status: INITIAL  3.  Increase left hip and knee strength to a solid 4+/5 to provide good stability for accomplishment  of functional activities.  Goal status: INITIAL  4.  Perform a reciprocating stair gait with one railing with pain not > 2-3/10.  Goal status: INITIAL   PLAN:  PT FREQUENCY: 3x/week  PT DURATION: 4 weeks  PLANNED INTERVENTIONS: Therapeutic exercises, Therapeutic activity, Neuromuscular re-education, Gait training, Patient/Family education, Self Care, Stair training, Electrical stimulation, Cryotherapy, Moist heat, Vasopneumatic device, and Manual therapy  PLAN FOR NEXT SESSION: Nustep.  Progress into TKA protocol.  Vasopneumatic with LE elevation.   Newman Pies, PTA 12/25/2022, 10:25 AM

## 2022-12-27 ENCOUNTER — Ambulatory Visit: Payer: Medicare Other

## 2022-12-27 DIAGNOSIS — M6281 Muscle weakness (generalized): Secondary | ICD-10-CM | POA: Diagnosis not present

## 2022-12-27 DIAGNOSIS — M25662 Stiffness of left knee, not elsewhere classified: Secondary | ICD-10-CM

## 2022-12-27 DIAGNOSIS — G8929 Other chronic pain: Secondary | ICD-10-CM | POA: Diagnosis not present

## 2022-12-27 DIAGNOSIS — M25562 Pain in left knee: Secondary | ICD-10-CM | POA: Diagnosis not present

## 2022-12-27 NOTE — Therapy (Signed)
OUTPATIENT PHYSICAL THERAPY LOWER EXTREMITY TREATMENT   Patient Name: Daniel Griffin MRN: [id number] DOB:November 16, 1960, 62 y.o., male Today's Date: 12/27/2022  END OF SESSION:  PT End of Session - 12/27/22 0908     Visit Number 5    Number of Visits 12    Date for PT Re-Evaluation 01/10/23    Authorization Type FOTO AT LEAST EVERY 5TH VISIT.  PROGRESS NOTE AT 10TH VISIT.  KX MODIFIER AFTER 15 VISITS.    PT Start Time 0900    PT Stop Time 0959    PT Time Calculation (min) 59 min    Activity Tolerance Patient tolerated treatment well    Behavior During Therapy WFL for tasks assessed/performed             Past Medical History:  Diagnosis Date   Arthritis    RA   Depression    Seizures (HCC) 06/21/2020   ETOH w/d related   Past Surgical History:  Procedure Laterality Date   ANKLE FRACTURE SURGERY     Left   APPENDECTOMY     IR 3D INDEPENDENT WKST  09/01/2021   IR 3D INDEPENDENT WKST  12/14/2021   IR ANGIO INTRA EXTRACRAN SEL INTERNAL CAROTID BILAT MOD SED  09/01/2021   IR ANGIO INTRA EXTRACRAN SEL INTERNAL CAROTID UNI R MOD SED  12/14/2021   IR ANGIO VERTEBRAL SEL VERTEBRAL BILAT MOD SED  09/01/2021   IR ANGIOGRAM FOLLOW UP STUDY  12/14/2021   IR ANGIOGRAM FOLLOW UP STUDY  12/14/2021   IR ANGIOGRAM FOLLOW UP STUDY  12/14/2021   IR CT HEAD LTD  12/14/2021   IR TRANSCATH/EMBOLIZ  12/14/2021   IR US GUIDE VASC ACCESS RIGHT  09/01/2021   IR US GUIDE VASC ACCESS RIGHT  12/14/2021   RADIOLOGY WITH ANESTHESIA N/A 12/14/2021   Procedure: IR WITH ANESTHESIA EMBOLIZATION;  Surgeon: Baldemar Lenis, MD;  Location: [facility] OR;  Service: Radiology;  Laterality: N/A;   REVERSE SHOULDER ARTHROPLASTY Left 05/13/2021   Procedure: REVERSE SHOULDER ARTHROPLASTY;  Surgeon: Beverely Low, MD;  Location: [facility] ORS;  Service: Orthopedics;  Laterality: Left;  with ISB   REVERSE SHOULDER ARTHROPLASTY Right 10/14/2021   Procedure: REVERSE SHOULDER ARTHROPLASTY;  Surgeon: Beverely Low, MD;  Location:  [facility] ORS;  Service: Orthopedics;  Laterality: Right;  with ISB   Patient Active Problem List   Diagnosis Date Noted   Body mass index 30.0-30.9, adult 08/03/2022   Brain aneurysm 12/14/2021   H/O total shoulder replacement, 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  THERAPY DIAG:  Chronic pain of left knee  Stiffness of left knee, not elsewhere classified  Muscle weakness (generalized)  Rationale for Evaluation and Treatment: Rehabilitation  ONSET DATE: Surgery, 12/11/22.  SUBJECTIVE:   SUBJECTIVE STATEMENT:  Pt reports RA pain in left hip today 8/10.  States that left knee feels stiff, but no pain.   PERTINENT HISTORY: RA, bilateral TSA's. PAIN:  Are you having pain? Yes: NPRS scale: 8/10 Pain location: left hip  PRECAUTIONS: Other: No ultrasound.  WEIGHT BEARING RESTRICTIONS: No  FALLS:  Has patient fallen in last 6 months? No.  LIVING ENVIRONMENT: Lives with: lives alone Lives in: House/apartment Stairs:  Has a ramp. Has following equipment at home: Dan Humphreys - 2 wheeled  OCCUPATION: Retired.  PLOF: Independent  PATIENT GOALS: Not have left knee pain.   OBJECTIVE:    PATIENT SURVEYS:  FOTO 28.  EDEMA:  Bulky dressing and ACE wrap donned.  PALPATION: C/o diffuse left knee pain.  LOWER EXTREMITY ROM: -5 degrees of left knee extension and flexion to 70 degrees.  LOWER EXTREMITY MMT:  Patient currently unable to perform a left SLR and SAQ.  TRANSFERS:  Sit to supine and supine to sit with patient using right LE to assist left LE.   GAIT: Step-to gait pattern with a FWW.  TODAY'S TREATMENT:                                                                                                                               DATE:                                    EXERCISE LOG  Exercise Repetitions and Resistance Comments  Nustep    Recumbent Bike Forward/backward rocking x 15 mins; seat 6   Heel/Toe Raises    Lunges    LAQs x20 reps   Seated Marches x20 reps   Heel Slides x20 reps   Ball Squeeze x20 reps   Clams Red x20 reps   Ham Curls Red x15 reps     Modalities  Date:  Unattended Estim: Knee, 80-150 hz, 15 mins, Pain Vaso: Knee, 34 degrees, low pressure, 15 mins, Pain and Tone   PATIENT EDUCATION:  Education details: Reviewed HEP. Person educated: Patient Education method: Explanation Education comprehension: verbalized understanding  HOME EXERCISE PROGRAM:   ASSESSMENT:  CLINICAL IMPRESSION: Pt arrives for today's treatment session reporting left knee stiffness without pain, but is reporting 8/10 left hip pain. Pt able to increase FOTO score to 55 today.   Pt able to demonstrate -5 degrees of left knee extension and 100 degrees of left knee flexion.   Pt able to tolerate increased reps with all exercises today without issue.  Normal responses to estim and vaso noted upon removal.  Pt denied any knee pain at completion of today's treatment session.  OBJECTIVE IMPAIRMENTS: Abnormal gait, decreased activity tolerance, difficulty walking, decreased ROM, decreased strength, and pain.   ACTIVITY LIMITATIONS: carrying, lifting, and locomotion level  PARTICIPATION LIMITATIONS: meal prep, cleaning, laundry, and yard work  PERSONAL FACTORS: 1 comorbidity: RA  are also affecting patient's functional outcome.   REHAB POTENTIAL: Excellent  CLINICAL DECISION MAKING: Stable/uncomplicated  EVALUATION COMPLEXITY: Low   GOALS: SHORT TERM GOALS: Target date: 12/27/22  Ind with an initial HEP Goal status: IN PROGRESS  2.  Full active left knee extension.   5/8: -5 degrees Goal status: IN PROGRESS  3.  Active left knee flexion to 90 degrees.  Goal  status: MET  LONG TERM GOALS: Target date: 03/13/23.  Ind with an advanced HEP.  Goal status: IN PROGRESS  2.  Active left knee flexion to 115 degrees+ so the patient can perform functional tasks and do so with pain not > 2-3/10.   5/8: 100 degrees Goal status: IN PROGRESS  3.  Increase left hip and knee strength to a solid 4+/5 to provide good stability for accomplishment of functional activities.  Goal status: IN PROGRESS  4.  Perform a reciprocating stair gait with one railing with pain not > 2-3/10.  Goal status: IN PROGRESS   PLAN:  PT FREQUENCY: 3x/week  PT DURATION: 4 weeks  PLANNED INTERVENTIONS: Therapeutic exercises, Therapeutic activity, Neuromuscular re-education, Gait training, Patient/Family education, Self Care, Stair training, Electrical stimulation, Cryotherapy, Moist heat, Vasopneumatic device, and Manual therapy  PLAN FOR NEXT SESSION: Nustep.  Progress into TKA protocol.  Vasopneumatic with LE elevation.   Newman Pies, PTA 12/27/2022, 10:25 AM

## 2023-01-01 ENCOUNTER — Ambulatory Visit: Payer: Medicare Other

## 2023-01-01 DIAGNOSIS — G8929 Other chronic pain: Secondary | ICD-10-CM

## 2023-01-01 DIAGNOSIS — M6281 Muscle weakness (generalized): Secondary | ICD-10-CM | POA: Diagnosis not present

## 2023-01-01 DIAGNOSIS — M25562 Pain in left knee: Secondary | ICD-10-CM | POA: Diagnosis not present

## 2023-01-01 DIAGNOSIS — M25662 Stiffness of left knee, not elsewhere classified: Secondary | ICD-10-CM | POA: Diagnosis not present

## 2023-01-01 NOTE — Therapy (Signed)
OUTPATIENT PHYSICAL THERAPY LOWER EXTREMITY TREATMENT   Patient Name: Daniel Griffin MRN: [id number] DOB:1961-03-17, 62 y.o., male Today's Date: 01/01/2023  END OF SESSION:  PT End of Session - 01/01/23 0911     Visit Number 6    Number of Visits 12    Date for PT Re-Evaluation 01/10/23    Authorization Type FOTO AT LEAST EVERY 5TH VISIT.  PROGRESS NOTE AT 10TH VISIT.  KX MODIFIER AFTER 15 VISITS.    PT Start Time 0900    Activity Tolerance Patient tolerated treatment well    Behavior During Therapy WFL for tasks assessed/performed             Past Medical History:  Diagnosis Date   Arthritis    RA   Depression    Seizures (HCC) 06/21/2020   ETOH w/d related   Past Surgical History:  Procedure Laterality Date   ANKLE FRACTURE SURGERY     Left   APPENDECTOMY     IR 3D INDEPENDENT WKST  09/01/2021   IR 3D INDEPENDENT WKST  12/14/2021   IR ANGIO INTRA EXTRACRAN SEL INTERNAL CAROTID BILAT MOD SED  09/01/2021   IR ANGIO INTRA EXTRACRAN SEL INTERNAL CAROTID UNI R MOD SED  12/14/2021   IR ANGIO VERTEBRAL SEL VERTEBRAL BILAT MOD SED  09/01/2021   IR ANGIOGRAM FOLLOW UP STUDY  12/14/2021   IR ANGIOGRAM FOLLOW UP STUDY  12/14/2021   IR ANGIOGRAM FOLLOW UP STUDY  12/14/2021   IR CT HEAD LTD  12/14/2021   IR TRANSCATH/EMBOLIZ  12/14/2021   IR US GUIDE VASC ACCESS RIGHT  09/01/2021   IR US GUIDE VASC ACCESS RIGHT  12/14/2021   RADIOLOGY WITH ANESTHESIA N/A 12/14/2021   Procedure: IR WITH ANESTHESIA EMBOLIZATION;  Surgeon: Baldemar Lenis, MD;  Location: [facility] OR;  Service: Radiology;  Laterality: N/A;   REVERSE SHOULDER ARTHROPLASTY Left 05/13/2021   Procedure: REVERSE SHOULDER ARTHROPLASTY;  Surgeon: Beverely Low, MD;  Location: [facility] ORS;  Service: Orthopedics;  Laterality: Left;  with ISB   REVERSE SHOULDER ARTHROPLASTY Right 10/14/2021   Procedure: REVERSE SHOULDER ARTHROPLASTY;  Surgeon: Beverely Low, MD;  Location: [facility] OR[facility];  Service: Orthopedics;  Laterality: Right;  with  ISB   Patient Active Problem List   Diagnosis Date Noted   Body mass index 30.0-30.9, adult 08/03/2022   Brain aneurysm 12/14/2021   H/O total shoulder replacement, 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  THERAPY DIAG:  Chronic pain of left knee  Stiffness of left knee, not elsewhere classified  Muscle weakness (generalized)  Rationale for Evaluation and Treatment: Rehabilitation  ONSET DATE: Surgery, 12/11/22.  SUBJECTIVE:   SUBJECTIVE STATEMENT:  Pt reports increased left knee pain of 7/10 today.  Pt also reports increased swelling today.  Was not able to get much sleep last night.  PERTINENT HISTORY: RA, bilateral TSA's. PAIN:  Are you having pain? Yes: NPRS scale: 7/10 Pain location: left knee  PRECAUTIONS: Other: No ultrasound.  WEIGHT BEARING RESTRICTIONS: No  FALLS:  Has patient fallen in last 6 months? No.  LIVING ENVIRONMENT: Lives with: lives alone Lives in: House/apartment Stairs:  Has a ramp. Has following equipment at home: Dan Humphreys - 2 wheeled  OCCUPATION: Retired.  PLOF: Independent  PATIENT GOALS: Not  have left knee pain.   OBJECTIVE:    PATIENT SURVEYS:  FOTO 28.  EDEMA:  Bulky dressing and ACE wrap donned.  PALPATION: C/o diffuse left knee pain.  LOWER EXTREMITY ROM: -5 degrees of left knee extension and flexion to 70 degrees.  LOWER EXTREMITY MMT:  Patient currently unable to perform a left SLR and SAQ.  TRANSFERS:  Sit to supine and supine to sit with patient using right LE to assist left LE.   GAIT: Step-to gait pattern with a FWW.  TODAY'S TREATMENT:                                                                                                                               DATE:                                    EXERCISE LOG  Exercise Repetitions and Resistance Comments  Nustep Lvl 3 x 15 mins; seat 9-8   Recumbent Bike    Heel/Toe Raises    Lunges    LAQs 2#x20 reps   Seated Marches 2#x20 reps   Heel Slides x20 reps   Ball Squeeze x25 reps   Clams Red x25 reps   Ham Curls Red x20 reps     Modalities  Date:  Unattended Estim: Knee, 80-150 hz, 15 mins, Pain Vaso: Knee, 34 degrees, low pressure, 15 mins, Pain and Tone   PATIENT EDUCATION:  Education details: Reviewed HEP. Person educated: Patient Education method: Explanation Education comprehension: verbalized understanding  HOME EXERCISE PROGRAM:   ASSESSMENT:  CLINICAL IMPRESSION: Pt arrives for today's treatment session reporting 7/10 left knee pain and increased edema.  Pt able to tolerate increased reps with all exercises today despite pain and edema.  Normal responses to estim and vaso noted upon removal.  Pt reported decreased pain at completion of today's treatment session.  OBJECTIVE IMPAIRMENTS: Abnormal gait, decreased activity tolerance, difficulty walking, decreased ROM, decreased strength, and pain.   ACTIVITY LIMITATIONS: carrying, lifting, and locomotion level  PARTICIPATION LIMITATIONS: meal prep, cleaning, laundry, and yard work  PERSONAL FACTORS: 1 comorbidity: RA  are also affecting patient's functional outcome.   REHAB POTENTIAL: Excellent  CLINICAL DECISION MAKING: Stable/uncomplicated  EVALUATION COMPLEXITY: Low   GOALS: SHORT TERM GOALS: Target date: 12/27/22  Ind with an initial HEP Goal status: IN PROGRESS  2.  Full active left knee extension.   5/8: -5 degrees Goal status: IN PROGRESS  3.  Active left knee flexion to 90 degrees.  Goal status: MET  LONG TERM GOALS: Target date: 03/13/23.  Ind with an advanced HEP.  Goal status: IN PROGRESS  2.  Active left knee flexion to 115 degrees+ so the patient can  perform functional tasks and do so with pain not > 2-3/10.   5/8: 100 degrees Goal status: IN PROGRESS  3.  Increase left hip and knee  strength to a solid 4+/5 to provide good stability for accomplishment of functional activities.  Goal status: IN PROGRESS  4.  Perform a reciprocating stair gait with one railing with pain not > 2-3/10.  Goal status: IN PROGRESS   PLAN:  PT FREQUENCY: 3x/week  PT DURATION: 4 weeks  PLANNED INTERVENTIONS: Therapeutic exercises, Therapeutic activity, Neuromuscular re-education, Gait training, Patient/Family education, Self Care, Stair training, Electrical stimulation, Cryotherapy, Moist heat, Vasopneumatic device, and Manual therapy  PLAN FOR NEXT SESSION: Nustep.  Progress into TKA protocol.  Vasopneumatic with LE elevation.   Newman Pies, PTA 01/01/2023, 10:01 AM

## 2023-01-03 ENCOUNTER — Ambulatory Visit: Payer: Medicare Other

## 2023-01-03 DIAGNOSIS — M25662 Stiffness of left knee, not elsewhere classified: Secondary | ICD-10-CM

## 2023-01-03 DIAGNOSIS — M25562 Pain in left knee: Secondary | ICD-10-CM | POA: Diagnosis not present

## 2023-01-03 DIAGNOSIS — M6281 Muscle weakness (generalized): Secondary | ICD-10-CM

## 2023-01-03 DIAGNOSIS — G8929 Other chronic pain: Secondary | ICD-10-CM

## 2023-01-03 NOTE — Therapy (Signed)
OUTPATIENT PHYSICAL THERAPY LOWER EXTREMITY TREATMENT   Patient Name: Daniel Griffin MRN: [id number] DOB:April 22, 1961, 62 y.o., male Today's Date: 01/03/2023  END OF SESSION:  PT End of Session - 01/03/23 0906     Visit Number 7    Number of Visits 12    Date for PT Re-Evaluation 01/10/23    Authorization Type FOTO AT LEAST EVERY 5TH VISIT.  PROGRESS NOTE AT 10TH VISIT.  KX MODIFIER AFTER 15 VISITS.    PT Start Time 0900    PT Stop Time 0954    PT Time Calculation (min) 54 min    Activity Tolerance Patient tolerated treatment well    Behavior During Therapy WFL for tasks assessed/performed             Past Medical History:  Diagnosis Date   Arthritis    RA   Depression    Seizures (HCC) 06/21/2020   ETOH w/d related   Past Surgical History:  Procedure Laterality Date   ANKLE FRACTURE SURGERY     Left   APPENDECTOMY     IR 3D INDEPENDENT WKST  09/01/2021   IR 3D INDEPENDENT WKST  12/14/2021   IR ANGIO INTRA EXTRACRAN SEL INTERNAL CAROTID BILAT MOD SED  09/01/2021   IR ANGIO INTRA EXTRACRAN SEL INTERNAL CAROTID UNI R MOD SED  12/14/2021   IR ANGIO VERTEBRAL SEL VERTEBRAL BILAT MOD SED  09/01/2021   IR ANGIOGRAM FOLLOW UP STUDY  12/14/2021   IR ANGIOGRAM FOLLOW UP STUDY  12/14/2021   IR ANGIOGRAM FOLLOW UP STUDY  12/14/2021   IR CT HEAD LTD  12/14/2021   IR TRANSCATH/EMBOLIZ  12/14/2021   IR US GUIDE VASC ACCESS RIGHT  09/01/2021   IR US GUIDE VASC ACCESS RIGHT  12/14/2021   RADIOLOGY WITH ANESTHESIA N/A 12/14/2021   Procedure: IR WITH ANESTHESIA EMBOLIZATION;  Surgeon: Baldemar Lenis, MD;  Location[facility] OR;  Service: Radiology;  Laterality: N/A;   REVERSE SHOULDER ARTHROPLASTY Left 05/13/2021   Procedure: REVERSE SHOULDER ARTHROPLASTY;  Surgeon: Beverely Low, MD;  Location: [facility] ORS;  Service: Orthopedics;  Laterality: Left;  with ISB   REVERSE SHOULDER ARTHROPLASTY Right 10/14/2021   Procedure: REVERSE SHOULDER ARTHROPLASTY;  Surgeon: Beverely Low, MD;   Location: [facility] OR[facility];  Service: Orthopedics;  Laterality: Right;  with ISB   Patient Active Problem List   Diagnosis Date Noted   Body mass index 30.0-30.9, adult 08/03/2022   Brain aneurysm 12/14/2021   H/O total shoulder replacement, 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  THERAPY DIAG:  Chronic pain of left knee  Stiffness of left knee, not elsewhere classified  Muscle weakness (generalized)  Rationale for Evaluation and Treatment: Rehabilitation  ONSET DATE: Surgery, 12/11/22.  SUBJECTIVE:   SUBJECTIVE STATEMENT:  Pt reports 5/10 left knee pain today with decreased swelling.   PERTINENT HISTORY: RA, bilateral TSA's. PAIN:  Are you having pain? Yes: NPRS scale: 5/10 Pain location: left knee  PRECAUTIONS: Other: No ultrasound.  WEIGHT BEARING RESTRICTIONS: No  FALLS:  Has patient fallen in last 6 months? No.  LIVING ENVIRONMENT: Lives with: lives alone Lives in: House/apartment Stairs:  Has a ramp. Has following equipment at home: Dan Humphreys - 2 wheeled  OCCUPATION: Retired.  PLOF: Independent  PATIENT GOALS:  Not have left knee pain.   OBJECTIVE:    PATIENT SURVEYS:  FOTO 28.  EDEMA:  Bulky dressing and ACE wrap donned.  PALPATION: C/o diffuse left knee pain.  LOWER EXTREMITY ROM: -5 degrees of left knee extension and flexion to 70 degrees.  LOWER EXTREMITY MMT:  Patient currently unable to perform a left SLR and SAQ.  TRANSFERS:  Sit to supine and supine to sit with patient using right LE to assist left LE.   GAIT: Step-to gait pattern with a FWW.  TODAY'S TREATMENT:                                                                                                                               DATE:                                    EXERCISE LOG  Exercise Repetitions and Resistance Comments  Nustep Lvl 3 x 15 mins; seat 7-6   Recumbent Bike    Heel/Toe Raises    Lunges    LAQs 2#x25 reps   Seated Marches 2#x25 reps   Heel Slides x20 reps   Ball Squeeze x25 reps   Clams Red x25 reps   Ham Curls Red x25 reps    Static Extension Stretch 2 mins    Modalities  Date:  Unattended Estim: Knee, 80-150 hz, 15 mins, Pain Vaso: Knee, 34 degrees, low pressure, 15 mins, Pain and Tone   PATIENT EDUCATION:  Education details: Reviewed HEP. Person educated: Patient Education method: Explanation Education comprehension: verbalized understanding  HOME EXERCISE PROGRAM:   ASSESSMENT:  CLINICAL IMPRESSION: Pt arrives for today's treatment session reporting 5/10 left knee pain.  Pt able to progress to seat six on the Nustep today with minimal discomfort.  Pt introduced to static extension stretch on the zero knee.  Pt able to tolerate increased reps with all exercises today.  Pt encouraged to continue HS stretch at home to tolerance.  Normal responses to estim and vaso noted upon removal.  Pt reported 3/10 left knee pain at completion of today's treatment session.   OBJECTIVE IMPAIRMENTS: Abnormal gait, decreased activity tolerance, difficulty walking, decreased ROM, decreased strength, and pain.   ACTIVITY LIMITATIONS: carrying, lifting, and locomotion level  PARTICIPATION LIMITATIONS: meal prep, cleaning, laundry, and yard work  PERSONAL FACTORS: 1 comorbidity: RA  are also affecting patient's functional outcome.   REHAB POTENTIAL: Excellent  CLINICAL DECISION MAKING: Stable/uncomplicated  EVALUATION COMPLEXITY: Low   GOALS: SHORT TERM GOALS: Target date: 12/27/22  Ind with an initial HEP Goal status: IN PROGRESS  2.  Full active left knee extension.   5/8: -5 degrees Goal status: IN PROGRESS  3.  Active left knee flexion to 90 degrees.   Goal status: MET  LONG TERM GOALS: Target date: 03/13/23.  Ind with an advanced HEP.  Goal status: IN PROGRESS  2.  Active left knee flexion to 115 degrees+ so the patient can perform functional tasks and do so with pain not > 2-3/10.   5/8: 100 degrees Goal status: IN PROGRESS  3.  Increase left hip and knee strength to a solid 4+/5 to provide good stability for accomplishment of functional activities.  Goal status: IN PROGRESS  4.  Perform a reciprocating stair gait with one railing with pain not > 2-3/10.  Goal status: IN PROGRESS   PLAN:  PT FREQUENCY: 3x/week  PT DURATION: 4 weeks  PLANNED INTERVENTIONS: Therapeutic exercises, Therapeutic activity, Neuromuscular re-education, Gait training, Patient/Family education, Self Care, Stair training, Electrical stimulation, Cryotherapy, Moist heat, Vasopneumatic device, and Manual therapy  PLAN FOR NEXT SESSION: Nustep.  Progress into TKA protocol.  Vasopneumatic with LE elevation.   Newman Pies, PTA 01/03/2023, 9:58 AM

## 2023-01-05 ENCOUNTER — Telehealth: Payer: Self-pay | Admitting: Pharmacy Technician

## 2023-01-05 ENCOUNTER — Other Ambulatory Visit: Payer: Self-pay | Admitting: Physician Assistant

## 2023-01-05 ENCOUNTER — Other Ambulatory Visit: Payer: Self-pay

## 2023-01-05 ENCOUNTER — Other Ambulatory Visit (HOSPITAL_COMMUNITY): Payer: Self-pay

## 2023-01-05 MED ORDER — ORENCIA CLICKJECT 125 MG/ML ~~LOC~~ SOAJ
125.0000 mg | SUBCUTANEOUS | 0 refills | Status: DC
  Filled 2023-01-05 – 2023-01-22 (×2): qty 4, 28d supply, fill #0

## 2023-01-05 NOTE — Telephone Encounter (Signed)
[employer] application for Orencia PAP printed to be placed in Sherron Ales, PA-C's folder.  Chesley Mires, PharmD, MPH, BCPS, CPP Clinical Pharmacist (Rheumatology and Pulmonology)

## 2023-01-05 NOTE — Telephone Encounter (Signed)
Received notification from [facility] regarding a prior authorization for [facility]. Authorization has been APPROVED from 01/05/23 to 07/08/2023.   Per test claim, copay for 28 days supply is $1828.91  Authorization # (Key: [id number]) - [id number]   Patient now has Medicare and no longer eligible for copay card. Will need to enroll for patient assistance.

## 2023-01-05 NOTE — Telephone Encounter (Signed)
Due to update lab work

## 2023-01-05 NOTE — Telephone Encounter (Signed)
Received notification from [facility] that patient has new [employer].  Plan has patient listed a male.  Submitted a Prior Authorization request to [facility] for [facility] via CoverMyMeds. Will update once we receive a response.  Key:  [id number]

## 2023-01-05 NOTE — Telephone Encounter (Signed)
Last Fill: 10/02/2022  Labs: 10/02/2022 CBC and CMP are normal.   TB Gold: 10/02/2022 Neg    Next Visit: 05/09/2023  Last Visit: 12/06/2022  DX: Rheumatoid arthritis involving multiple sites with positive rheumatoid factor   Current Dose per office note 12/06/2022: Orencia 125 mg sq injections once weekly   Okay to refill Orencia?

## 2023-01-08 ENCOUNTER — Ambulatory Visit: Payer: Medicare Other

## 2023-01-08 DIAGNOSIS — M6281 Muscle weakness (generalized): Secondary | ICD-10-CM | POA: Diagnosis not present

## 2023-01-08 DIAGNOSIS — M25662 Stiffness of left knee, not elsewhere classified: Secondary | ICD-10-CM | POA: Diagnosis not present

## 2023-01-08 DIAGNOSIS — G8929 Other chronic pain: Secondary | ICD-10-CM | POA: Diagnosis not present

## 2023-01-08 DIAGNOSIS — M25562 Pain in left knee: Secondary | ICD-10-CM | POA: Diagnosis not present

## 2023-01-08 NOTE — Telephone Encounter (Addendum)
Spoke with patient's sister [name] regarding Orencia coverage. She requested she be placed as point of contact as patient has difficulty hearing on his mobile number. Application has been mailed to home per [name]'s request.  Provider portion placed in Sherron Ales, PA-C's folder for signature  Chesley Mires, PharmD, MPH, BCPS, CPP Clinical Pharmacist (Rheumatology and Pulmonology)

## 2023-01-08 NOTE — Therapy (Signed)
OUTPATIENT PHYSICAL THERAPY LOWER EXTREMITY TREATMENT   Patient Name: Daniel Griffin MRN: [id number] DOB:1960-11-18, 62 y.o., male Today's Date: 01/08/2023  END OF SESSION:  PT End of Session - 01/08/23 0904     Visit Number 8    Number of Visits 12    Date for PT Re-Evaluation 01/10/23    Authorization Type FOTO AT LEAST EVERY 5TH VISIT.  PROGRESS NOTE AT 10TH VISIT.  KX MODIFIER AFTER 15 VISITS.    PT Start Time 0900    PT Stop Time 0946    PT Time Calculation (min) 46 min    Activity Tolerance Patient tolerated treatment well    Behavior During Therapy WFL for tasks assessed/performed             Past Medical History:  Diagnosis Date   Arthritis    RA   Depression    Seizures (HCC) 06/21/2020   ETOH w/d related   Past Surgical History:  Procedure Laterality Date   ANKLE FRACTURE SURGERY     Left   APPENDECTOMY     IR 3D INDEPENDENT WKST  09/01/2021   IR 3D INDEPENDENT WKST  12/14/2021   IR ANGIO INTRA EXTRACRAN SEL INTERNAL CAROTID BILAT MOD SED  09/01/2021   IR ANGIO INTRA EXTRACRAN SEL INTERNAL CAROTID UNI R MOD SED  12/14/2021   IR ANGIO VERTEBRAL SEL VERTEBRAL BILAT MOD SED  09/01/2021   IR ANGIOGRAM FOLLOW UP STUDY  12/14/2021   IR ANGIOGRAM FOLLOW UP STUDY  12/14/2021   IR ANGIOGRAM FOLLOW UP STUDY  12/14/2021   IR CT HEAD LTD  12/14/2021   IR TRANSCATH/EMBOLIZ  12/14/2021   IR US GUIDE VASC ACCESS RIGHT  09/01/2021   IR US GUIDE VASC ACCESS RIGHT  12/14/2021   RADIOLOGY WITH ANESTHESIA N/A 12/14/2021   Procedure: IR WITH ANESTHESIA EMBOLIZATION;  Surgeon: Baldemar Lenis, MD;  Location: [facility] OR;  Service: Radiology;  Laterality: N/A;   REVERSE SHOULDER ARTHROPLASTY Left 05/13/2021   Procedure: REVERSE SHOULDER ARTHROPLASTY;  Surgeon: Beverely Low, MD;  Location: [facility] ORS;  Service: Orthopedics;  Laterality: Left;  with ISB   REVERSE SHOULDER ARTHROPLASTY Right 10/14/2021   Procedure: REVERSE SHOULDER ARTHROPLASTY;  Surgeon: Beverely Low, MD;   Location: [facility] OR[facility];  Service: Orthopedics;  Laterality: Right;  with ISB   Patient Active Problem List   Diagnosis Date Noted   Body mass index 30.0-30.9, adult 08/03/2022   Brain aneurysm 12/14/2021   H/O total shoulder replacement, 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  THERAPY DIAG:  Chronic pain of left knee  Stiffness of left knee, not elsewhere classified  Muscle weakness (generalized)  Rationale for Evaluation and Treatment: Rehabilitation  ONSET DATE: Surgery, 12/11/22.  SUBJECTIVE:   SUBJECTIVE STATEMENT:  Pt reports 4/10 left knee pain today with mild stiffness.  Pt ambulates into facility with walking stick today.    PERTINENT HISTORY: RA, bilateral TSA's. PAIN:  Are you having pain? Yes: NPRS scale: 4/10 Pain location: left knee  PRECAUTIONS: Other: No ultrasound.  WEIGHT BEARING RESTRICTIONS: No  FALLS:  Has patient fallen in last 6 months? No.  LIVING ENVIRONMENT: Lives with: lives alone Lives in: House/apartment Stairs:  Has a ramp. Has following equipment at home: Dan Humphreys - 2  wheeled  OCCUPATION: Retired.  PLOF: Independent  PATIENT GOALS: Not have left knee pain.   OBJECTIVE:    PATIENT SURVEYS:  FOTO 28.  EDEMA:  Bulky dressing and ACE wrap donned.  PALPATION: C/o diffuse left knee pain.  LOWER EXTREMITY ROM: -5 degrees of left knee extension and flexion to 70 degrees.  LOWER EXTREMITY MMT:  Patient currently unable to perform a left SLR and SAQ.  TRANSFERS:  Sit to supine and supine to sit with patient using right LE to assist left LE.   GAIT: Step-to gait pattern with a FWW.  TODAY'S TREATMENT:                                                                                                                               DATE:                                    EXERCISE LOG  Exercise Repetitions and Resistance Comments  Nustep Lvl 3 x 15 mins; seat 7-6   Recumbent Bike    Heel/Toe Raises    Lunges 8" box x 2 mins   Rockerboard 2 mins   LAQs 2#x25 reps   Seated Marches 2#x25 reps   Heel Slides x20 reps   Ball Squeeze x25 reps   Clams Red x25 reps   Ham Curls Red x25 reps    Static Extension Stretch 2 mins    Modalities  Date:  Unattended Estim: Knee, 80-150 hz, 15 mins, Pain Vaso: Knee, 34 degrees, low pressure, 15 mins, Pain and Tone   PATIENT EDUCATION:  Education details: Reviewed HEP. Person educated: Patient Education method: Explanation Education comprehension: verbalized understanding  HOME EXERCISE PROGRAM:   ASSESSMENT:  CLINICAL IMPRESSION: Pt arrives for today's treatment session reporting 4/10 left knee pain.  Pt ambulates into the facility with walking stick today vs FWW, reports he has been using it without issue since Friday.  Pt unable to advance to seat 5 on Nustep due to hitting right knee and would benefit from progression to recumbent bike at next session. Pt introduced to standing lunges today without issue.  Pt able to tolerate increased reps with all seated exercises today with minimal fatigue and cues to avoid compensatory leaning with LAQs and seated marches.  Normal responses to estim and vaso noted upon removal.  Pt reported 2/10 left knee pain at completion of today's treatment session.  OBJECTIVE IMPAIRMENTS: Abnormal gait, decreased activity tolerance, difficulty walking, decreased ROM, decreased strength, and pain.   ACTIVITY LIMITATIONS: carrying, lifting, and locomotion level  PARTICIPATION LIMITATIONS: meal prep, cleaning, laundry, and yard work  PERSONAL FACTORS: 1 comorbidity: RA  are also affecting patient's functional outcome.   REHAB POTENTIAL: Excellent  CLINICAL  DECISION MAKING: Stable/uncomplicated  EVALUATION COMPLEXITY: Low   GOALS: SHORT TERM GOALS: Target date: 12/27/22  Ind with an initial HEP Goal status:  IN PROGRESS  2.  Full active left knee extension.   5/8: -5 degrees Goal status: IN PROGRESS  3.  Active left knee flexion to 90 degrees.  Goal status: MET  LONG TERM GOALS: Target date: 03/13/23.  Ind with an advanced HEP.  Goal status: IN PROGRESS  2.  Active left knee flexion to 115 degrees+ so the patient can perform functional tasks and do so with pain not > 2-3/10.   5/8: 100 degrees Goal status: IN PROGRESS  3.  Increase left hip and knee strength to a solid 4+/5 to provide good stability for accomplishment of functional activities.  Goal status: IN PROGRESS  4.  Perform a reciprocating stair gait with one railing with pain not > 2-3/10.  Goal status: IN PROGRESS   PLAN:  PT FREQUENCY: 3x/week  PT DURATION: 4 weeks  PLANNED INTERVENTIONS: Therapeutic exercises, Therapeutic activity, Neuromuscular re-education, Gait training, Patient/Family education, Self Care, Stair training, Electrical stimulation, Cryotherapy, Moist heat, Vasopneumatic device, and Manual therapy  PLAN FOR NEXT SESSION: Nustep.  Progress into TKA protocol.  Vasopneumatic with LE elevation.   Newman Pies, PTA 01/08/2023, 10:24 AM

## 2023-01-09 NOTE — Telephone Encounter (Signed)
Received signed provider forms for [employer] patient assistance application. Pending patient portion

## 2023-01-10 ENCOUNTER — Other Ambulatory Visit (HOSPITAL_COMMUNITY): Payer: Self-pay

## 2023-01-10 ENCOUNTER — Ambulatory Visit: Payer: Medicare Other

## 2023-01-10 DIAGNOSIS — M6281 Muscle weakness (generalized): Secondary | ICD-10-CM | POA: Diagnosis not present

## 2023-01-10 DIAGNOSIS — M25662 Stiffness of left knee, not elsewhere classified: Secondary | ICD-10-CM | POA: Diagnosis not present

## 2023-01-10 DIAGNOSIS — G8929 Other chronic pain: Secondary | ICD-10-CM | POA: Diagnosis not present

## 2023-01-10 DIAGNOSIS — M25562 Pain in left knee: Secondary | ICD-10-CM | POA: Diagnosis not present

## 2023-01-10 NOTE — Therapy (Signed)
OUTPATIENT PHYSICAL THERAPY LOWER EXTREMITY TREATMENT   Patient Name: Daniel Griffin MRN: [id number] DOB:[birth year], 62 y.o., male Today's Date: 01/10/2023  END OF SESSION:  PT End of Session - 01/10/23 0901     Visit Number 9    Number of Visits 12    Date for PT Re-Evaluation 01/10/23    Authorization Type FOTO AT LEAST EVERY 5TH VISIT.  PROGRESS NOTE AT 10TH VISIT.  KX MODIFIER AFTER 15 VISITS.    PT Start Time 0900    PT Stop Time 0951    PT Time Calculation (min) 51 min    Activity Tolerance Patient tolerated treatment well    Behavior During Therapy WFL for tasks assessed/performed             Past Medical History:  Diagnosis Date   Arthritis    RA   Depression    Seizures (HCC) 06/21/2020   ETOH w/d related   Past Surgical History:  Procedure Laterality Date   ANKLE FRACTURE SURGERY     Left   APPENDECTOMY     IR 3D INDEPENDENT WKST  09/01/2021   IR 3D INDEPENDENT WKST  12/14/2021   IR ANGIO INTRA EXTRACRAN SEL INTERNAL CAROTID BILAT MOD SED  09/01/2021   IR ANGIO INTRA EXTRACRAN SEL INTERNAL CAROTID UNI R MOD SED  12/14/2021   IR ANGIO VERTEBRAL SEL VERTEBRAL BILAT MOD SED  09/01/2021   IR ANGIOGRAM FOLLOW UP STUDY  12/14/2021   IR ANGIOGRAM FOLLOW UP STUDY  12/14/2021   IR ANGIOGRAM FOLLOW UP STUDY  12/14/2021   IR CT HEAD LTD  12/14/2021   IR TRANSCATH/EMBOLIZ  12/14/2021   IR US GUIDE VASC ACCESS RIGHT  09/01/2021   IR US GUIDE VASC ACCESS RIGHT  12/14/2021   RADIOLOGY WITH ANESTHESIA N/A 12/14/2021   Procedure: IR WITH ANESTHESIA EMBOLIZATION;  Surgeon: Baldemar Lenis, MD;  Location: [facility] OR;  Service: Radiology;  Laterality: N/A;   REVERSE SHOULDER ARTHROPLASTY Left 05/13/2021   Procedure: REVERSE SHOULDER ARTHROPLASTY;  Surgeon: Beverely Low, MD;  Location: [facility] ORS;  Service: Orthopedics;  Laterality: Left;  with ISB   REVERSE SHOULDER ARTHROPLASTY Right 10/14/2021   Procedure: REVERSE SHOULDER ARTHROPLASTY;  Surgeon: Beverely Low, MD;   Location: [facility] ORS;  Service: Orthopedics;  Laterality: Right;  with ISB   Patient Active Problem List   Diagnosis Date Noted   Body mass index 30.0-30.9, adult 08/03/2022   Brain aneurysm 12/14/2021   H/O total shoulder replacement, 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  THERAPY DIAG:  Chronic pain of left knee  Stiffness of left knee, not elsewhere classified  Muscle weakness (generalized)  Rationale for Evaluation and Treatment: Rehabilitation  ONSET DATE: Surgery, 12/11/22.  SUBJECTIVE:   SUBJECTIVE STATEMENT:  Pt reports 4/10 left knee pain today with mild stiffness.  Pt ambulates into facility with walking stick today.    PERTINENT HISTORY: RA, bilateral TSA's. PAIN:  Are you having pain? Yes: NPRS scale: 4/10 Pain location: left knee  PRECAUTIONS: Other: No ultrasound.  WEIGHT BEARING RESTRICTIONS: No  FALLS:  Has patient fallen in last 6 months? No.  LIVING ENVIRONMENT: Lives with: lives alone Lives in: House/apartment Stairs:  Has a ramp. Has following equipment at home: Dan Humphreys - 2  wheeled  OCCUPATION: Retired.  PLOF: Independent  PATIENT GOALS: Not have left knee pain.   OBJECTIVE:    PATIENT SURVEYS:  FOTO 28.  EDEMA:  Bulky dressing and ACE wrap donned.  PALPATION: C/o diffuse left knee pain.  LOWER EXTREMITY ROM: -5 degrees of left knee extension and flexion to 70 degrees.  LOWER EXTREMITY MMT:  Patient currently unable to perform a left SLR and SAQ.  TRANSFERS:  Sit to supine and supine to sit with patient using right LE to assist left LE.   GAIT: Step-to gait pattern with a FWW.  TODAY'S TREATMENT:                                                                                                                               DATE:                                    EXERCISE LOG  Exercise Repetitions and Resistance Comments  Nustep Lvl 3 x 5 mins; seat 7   Recumbent Bike Lvl 2 x 15 mins; seat 7   Heel/Toe Raises    Lunges 8" box x 3 mins   Rockerboard 3 mins   LAQs 2#x25 reps   Seated Marches 2#x25 reps   Heel Slides x20 reps   Ball Squeeze x25 reps   Clams Red x25 reps   Ham Curls Red x25 reps    Static Extension Stretch 2 mins    Modalities  Date:  Unattended Estim: Knee, 80-150 hz, 10 mins, Pain Vaso: Knee, 34 degrees, low pressure, 10 mins, Pain and Tone   PATIENT EDUCATION:  Education details: Reviewed HEP. Person educated: Patient Education method: Explanation Education comprehension: verbalized understanding  HOME EXERCISE PROGRAM:   ASSESSMENT:  CLINICAL IMPRESSION: Pt arrives for today's treatment session reporting 4/10 left knee pain.  Pt able to tolerate transition to recumbent bike today with minimal increase in discomfort.  Pt able to demonstrate -2 degrees of extension today, meeting his STG at this time.  Pt encouraged to perform lunge stretch at home to assist with increased left knee flexion.  Normal responses to estim and vaso noted upon removal.  Pt reported 2/10 left knee pain at completion of today's treatment session.   OBJECTIVE IMPAIRMENTS: Abnormal gait, decreased activity tolerance, difficulty walking, decreased ROM, decreased strength, and pain.   ACTIVITY LIMITATIONS: carrying, lifting, and locomotion level  PARTICIPATION LIMITATIONS: meal prep, cleaning, laundry, and yard work  PERSONAL FACTORS: 1 comorbidity: RA  are also affecting patient's functional outcome.   REHAB POTENTIAL: Excellent  CLINICAL DECISION MAKING: Stable/uncomplicated  EVALUATION COMPLEXITY: Low   GOALS: SHORT TERM GOALS: Target date: 12/27/22  Ind with an initial HEP Goal status: MET  2.  Full active left  knee extension.   5/8: -5 degrees; 5/22: -2 degrees Goal status: MET  3.  Active left knee flexion to  90 degrees.  Goal status: MET  LONG TERM GOALS: Target date: 03/13/23.  Ind with an advanced HEP.  Goal status: IN PROGRESS  2.  Active left knee flexion to 115 degrees+ so the patient can perform functional tasks and do so with pain not > 2-3/10.   5/8: 100 degrees Goal status: IN PROGRESS  3.  Increase left hip and knee strength to a solid 4+/5 to provide good stability for accomplishment of functional activities.  Goal status: IN PROGRESS  4.  Perform a reciprocating stair gait with one railing with pain not > 2-3/10.  Goal status: IN PROGRESS   PLAN:  PT FREQUENCY: 3x/week  PT DURATION: 4 weeks  PLANNED INTERVENTIONS: Therapeutic exercises, Therapeutic activity, Neuromuscular re-education, Gait training, Patient/Family education, Self Care, Stair training, Electrical stimulation, Cryotherapy, Moist heat, Vasopneumatic device, and Manual therapy  PLAN FOR NEXT SESSION: Nustep.  Progress into TKA protocol.  Vasopneumatic with LE elevation.   Newman Pies, PTA 01/10/2023, 9:55 AM

## 2023-01-16 ENCOUNTER — Other Ambulatory Visit: Payer: Self-pay

## 2023-01-16 ENCOUNTER — Ambulatory Visit: Payer: Medicare Other

## 2023-01-16 DIAGNOSIS — M6281 Muscle weakness (generalized): Secondary | ICD-10-CM

## 2023-01-16 DIAGNOSIS — M25662 Stiffness of left knee, not elsewhere classified: Secondary | ICD-10-CM

## 2023-01-16 DIAGNOSIS — M25562 Pain in left knee: Secondary | ICD-10-CM | POA: Diagnosis not present

## 2023-01-16 DIAGNOSIS — G8929 Other chronic pain: Secondary | ICD-10-CM

## 2023-01-16 NOTE — Therapy (Signed)
OUTPATIENT PHYSICAL THERAPY LOWER EXTREMITY TREATMENT   Patient Name: Daniel Griffin MRN: [id number] DOB:11/20/1960, 62 y.o., male Today's Date: 01/16/2023  END OF SESSION:  PT End of Session - 01/16/23 0903     Visit Number 10    Number of Visits 12    Date for PT Re-Evaluation 01/10/23    Authorization Type FOTO AT LEAST EVERY 5TH VISIT.  PROGRESS NOTE AT 10TH VISIT.  KX MODIFIER AFTER 15 VISITS.    PT Start Time 0900    PT Stop Time 0957    PT Time Calculation (min) 57 min    Activity Tolerance Patient tolerated treatment well    Behavior During Therapy WFL for tasks assessed/performed             Past Medical History:  Diagnosis Date   Arthritis    RA   Depression    Seizures (HCC) 06/21/2020   ETOH w/d related   Past Surgical History:  Procedure Laterality Date   ANKLE FRACTURE SURGERY     Left   APPENDECTOMY     IR 3D INDEPENDENT WKST  09/01/2021   IR 3D INDEPENDENT WKST  12/14/2021   IR ANGIO INTRA EXTRACRAN SEL INTERNAL CAROTID BILAT MOD SED  09/01/2021   IR ANGIO INTRA EXTRACRAN SEL INTERNAL CAROTID UNI R MOD SED  12/14/2021   IR ANGIO VERTEBRAL SEL VERTEBRAL BILAT MOD SED  09/01/2021   IR ANGIOGRAM FOLLOW UP STUDY  12/14/2021   IR ANGIOGRAM FOLLOW UP STUDY  12/14/2021   IR ANGIOGRAM FOLLOW UP STUDY  12/14/2021   IR CT HEAD LTD  12/14/2021   IR TRANSCATH/EMBOLIZ  12/14/2021   IR US GUIDE VASC ACCESS RIGHT  09/01/2021   IR US GUIDE VASC ACCESS RIGHT  12/14/2021   RADIOLOGY WITH ANESTHESIA N/A 12/14/2021   Procedure: IR WITH ANESTHESIA EMBOLIZATION;  Surgeon: Baldemar Lenis, MD;  Location: [facility] OR;  Service: Radiology;  Laterality: N/A;   REVERSE SHOULDER ARTHROPLASTY Left 05/13/2021   Procedure: REVERSE SHOULDER ARTHROPLASTY;  Surgeon: Beverely Low, MD;  Location: [facility];  Service: Orthopedics;  Laterality: Left;  with ISB   REVERSE SHOULDER ARTHROPLASTY Right 10/14/2021   Procedure: REVERSE SHOULDER ARTHROPLASTY;  Surgeon: Beverely Low, MD;   Location: [facility];  Service: Orthopedics;  Laterality: Right;  with ISB   Patient Active Problem List   Diagnosis Date Noted   Body mass index 30.0-30.9, adult 08/03/2022   Brain aneurysm 12/14/2021   H/O total shoulder replacement, 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  THERAPY DIAG:  Chronic pain of left knee  Stiffness of left knee, not elsewhere classified  Muscle weakness (generalized)  Rationale for Evaluation and Treatment: Rehabilitation  ONSET DATE: Surgery, 12/11/22.  SUBJECTIVE:   SUBJECTIVE STATEMENT:  Pt denies any pain upon arriving for today's treatment session.  Pt ambulates into the facility without AD.   PERTINENT HISTORY: RA, bilateral TSA's. PAIN:  Are you having pain? No  PRECAUTIONS: Other: No ultrasound.  WEIGHT BEARING RESTRICTIONS: No  FALLS:  Has patient fallen in last 6 months? No.  LIVING ENVIRONMENT: Lives with: lives alone Lives in: House/apartment Stairs:  Has a ramp. Has following equipment at home: Dan Humphreys - 2 wheeled  OCCUPATION: Retired.  PLOF: Independent  PATIENT  GOALS: Not have left knee pain.   OBJECTIVE:    PATIENT SURVEYS:  FOTO 28.  EDEMA:  Bulky dressing and ACE wrap donned.  PALPATION: C/o diffuse left knee pain.  LOWER EXTREMITY ROM: -5 degrees of left knee extension and flexion to 70 degrees.  LOWER EXTREMITY MMT:  Patient currently unable to perform a left SLR and SAQ.  TRANSFERS:  Sit to supine and supine to sit with patient using right LE to assist left LE.   GAIT: Step-to gait pattern with a FWW.  TODAY'S TREATMENT:                                                                                                                               DATE:                                    EXERCISE LOG  Exercise Repetitions and Resistance Comments  Nustep    Recumbent Bike Lvl 2 x 15 mins; seat 5   Heel/Toe Raises    Lunges 12" box x 3 mins   Rockerboard 4 mins   LAQs 2#x40 reps   Seated Marches 2#x30 reps   Heel Slides    Ball Squeeze x30 reps   Clams Red x30 reps   Ham Curls Red x30 reps    Static Extension Stretch 3 mins    Modalities  Date:  Unattended Estim: Knee, 80-150 hz, 15 mins, Pain Vaso: Knee, 34 degrees, low pressure, 15 mins, Pain and Tone   PATIENT EDUCATION:  Education details: Reviewed HEP. Person educated: Patient Education method: Explanation Education comprehension: verbalized understanding  HOME EXERCISE PROGRAM:   ASSESSMENT:  CLINICAL IMPRESSION: Pt arrives for today's treatment session denying any pain.  Pt able to increase FOTO score to 69 today.  Pt ambulates into the facility without use of AD.  Pt able to progress to seat five today on the recumbent bike with minimal discomfort.  Pt able to demonstrate 110 degrees of active left knee flexion today.  Pt has made good progress towards all of his goals at this time.  Normal responses to estim and vaso noted upon removal.  Pt denied any pain at completion of today's treatment session.  OBJECTIVE IMPAIRMENTS: Abnormal gait, decreased activity tolerance, difficulty walking, decreased ROM, decreased strength, and pain.   ACTIVITY LIMITATIONS: carrying, lifting, and locomotion level  PARTICIPATION LIMITATIONS: meal prep, cleaning, laundry, and yard work  PERSONAL FACTORS: 1 comorbidity: RA  are also affecting patient's functional outcome.   REHAB POTENTIAL: Excellent  CLINICAL DECISION MAKING: Stable/uncomplicated  EVALUATION COMPLEXITY: Low   GOALS: SHORT TERM GOALS: Target date: 12/27/22  Ind with an initial HEP Goal status: MET  2.  Full active left knee extension.   5/8: -5 degrees; 5/22: -2  degrees Goal status: MET  3.  Active left knee flexion to 90 degrees.  Goal status:  MET  LONG TERM GOALS: Target date: 03/13/23.  Ind with an advanced HEP.  Goal status: MET  2.  Active left knee flexion to 115 degrees+ so the patient can perform functional tasks and do so with pain not > 2-3/10.   5/8: 100 degrees; 5/28: 110 degrees Goal status: IN PROGRESS  3.  Increase left hip and knee strength to a solid 4+/5 to provide good stability for accomplishment of functional activities.  Goal status: IN PROGRESS  4.  Perform a reciprocating stair gait with one railing with pain not > 2-3/10.   5/28: step-to pattern with one rail, no pain Goal status: PARTIALLY MET   PLAN:  PT FREQUENCY: 3x/week  PT DURATION: 4 weeks  PLANNED INTERVENTIONS: Therapeutic exercises, Therapeutic activity, Neuromuscular re-education, Gait training, Patient/Family education, Self Care, Stair training, Electrical stimulation, Cryotherapy, Moist heat, Vasopneumatic device, and Manual therapy  PLAN FOR NEXT SESSION: Nustep.  Progress into TKA protocol.  Vasopneumatic with LE elevation.   Newman Pies, PTA 01/16/2023, 10:30 AM   Progress Note Reporting Period 12/13/22 to 01/17/23.  See note below for Objective Data and Assessment of Progress/Goals. Very good progress toward goals and improving left knee range of motion.      [place] Applegate MPT

## 2023-01-17 ENCOUNTER — Ambulatory Visit: Payer: Medicare Other

## 2023-01-17 DIAGNOSIS — G8929 Other chronic pain: Secondary | ICD-10-CM | POA: Diagnosis not present

## 2023-01-17 DIAGNOSIS — M25662 Stiffness of left knee, not elsewhere classified: Secondary | ICD-10-CM | POA: Diagnosis not present

## 2023-01-17 DIAGNOSIS — M25562 Pain in left knee: Secondary | ICD-10-CM | POA: Diagnosis not present

## 2023-01-17 DIAGNOSIS — M6281 Muscle weakness (generalized): Secondary | ICD-10-CM | POA: Diagnosis not present

## 2023-01-17 NOTE — Therapy (Signed)
OUTPATIENT PHYSICAL THERAPY LOWER EXTREMITY TREATMENT   Patient Name: Daniel Griffin MRN: [id number] DOB:May 07, 1961, 62 y.o., male Today's Date: 01/17/2023  END OF SESSION:  PT End of Session - 01/17/23 0905     Visit Number 11    Number of Visits 12    Date for PT Re-Evaluation 01/10/23    Authorization Type FOTO AT LEAST EVERY 5TH VISIT.  PROGRESS NOTE AT 10TH VISIT.  KX MODIFIER AFTER 15 VISITS.    PT Start Time 0900    PT Stop Time 0956    PT Time Calculation (min) 56 min    Activity Tolerance Patient tolerated treatment well    Behavior During Therapy WFL for tasks assessed/performed             Past Medical History:  Diagnosis Date   Arthritis    RA   Depression    Seizures (HCC) 06/21/2020   ETOH w/d related   Past Surgical History:  Procedure Laterality Date   ANKLE FRACTURE SURGERY     Left   APPENDECTOMY     IR 3D INDEPENDENT WKST  09/01/2021   IR 3D INDEPENDENT WKST  12/14/2021   IR ANGIO INTRA EXTRACRAN SEL INTERNAL CAROTID BILAT MOD SED  09/01/2021   IR ANGIO INTRA EXTRACRAN SEL INTERNAL CAROTID UNI R MOD SED  12/14/2021   IR ANGIO VERTEBRAL SEL VERTEBRAL BILAT MOD SED  09/01/2021   IR ANGIOGRAM FOLLOW UP STUDY  12/14/2021   IR ANGIOGRAM FOLLOW UP STUDY  12/14/2021   IR ANGIOGRAM FOLLOW UP STUDY  12/14/2021   IR CT HEAD LTD  12/14/2021   IR TRANSCATH/EMBOLIZ  12/14/2021   IR US GUIDE VASC ACCESS RIGHT  09/01/2021   IR US GUIDE VASC ACCESS RIGHT  12/14/2021   RADIOLOGY WITH ANESTHESIA N/A 12/14/2021   Procedure: IR WITH ANESTHESIA EMBOLIZATION;  Surgeon: Baldemar Lenis, MD;  Location: [facility] OR;  Service: Radiology;  Laterality: N/A;   REVERSE SHOULDER ARTHROPLASTY Left 05/13/2021   Procedure: REVERSE SHOULDER ARTHROPLASTY;  Surgeon: Beverely Low, MD;  Location: [facility] ORS;  Service: Orthopedics;  Laterality: Left;  with ISB   REVERSE SHOULDER ARTHROPLASTY Right 10/14/2021   Procedure: REVERSE SHOULDER ARTHROPLASTY;  Surgeon: Beverely Low, MD;   Location: [facility];  Service: Orthopedics;  Laterality: Right;  with ISB   Patient Active Problem List   Diagnosis Date Noted   Body mass index 30.0-30.9, adult 08/03/2022   Brain aneurysm 12/14/2021   H/O total shoulder replacement, 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  THERAPY DIAG:  Chronic pain of left knee  Stiffness of left knee, not elsewhere classified  Muscle weakness (generalized)  Rationale for Evaluation and Treatment: Rehabilitation  ONSET DATE: Surgery, 12/11/22.  SUBJECTIVE:   SUBJECTIVE STATEMENT:  Pt denies any pain upon arriving for today's treatment session. Pt ready for discharge at next session.  PERTINENT HISTORY: RA, bilateral TSA's. PAIN:  Are you having pain? No  PRECAUTIONS: Other: No ultrasound.  WEIGHT BEARING RESTRICTIONS: No  FALLS:  Has patient fallen in last 6 months? No.  LIVING ENVIRONMENT: Lives with: lives alone Lives in: House/apartment Stairs:  Has a ramp. Has following equipment at home: Dan Humphreys - 2 wheeled  OCCUPATION: Retired.  PLOF: Independent  PATIENT GOALS: Not  have left knee pain.   OBJECTIVE:    PATIENT SURVEYS:  FOTO 28.  EDEMA:  Bulky dressing and ACE wrap donned.  PALPATION: C/o diffuse left knee pain.  LOWER EXTREMITY ROM: -5 degrees of left knee extension and flexion to 70 degrees.  LOWER EXTREMITY MMT:  Patient currently unable to perform a left SLR and SAQ.  TRANSFERS:  Sit to supine and supine to sit with patient using right LE to assist left LE.   GAIT: Step-to gait pattern with a FWW.  TODAY'S TREATMENT:                                                                                                                               DATE:                                    EXERCISE LOG  Exercise Repetitions and Resistance Comments  Nustep    Recumbent Bike Lvl 2 x 16 mins; seat 5   Heel/Toe Raises    Lunges 14" box x 3 mins   Rockerboard 4 mins   LAQs 3# x20 reps   Seated Marches 3# x20 reps   Heel Slides    Ball Squeeze x30 reps   Clams Green x20 reps   Ham Curls Green x20 reps    Tax adviser     Modalities  Date:  Unattended Estim: Knee, 80-150 hz, 15 mins, Pain Vaso: Knee, 34 degrees, low pressure, 15 mins, Pain and Tone   PATIENT EDUCATION:  Education details: Reviewed HEP. Person educated: Patient Education method: Explanation Education comprehension: verbalized understanding  HOME EXERCISE PROGRAM:   ASSESSMENT:  CLINICAL IMPRESSION: Pt arrives for today's treatment session denying any pain.  Pt reports feeling really good after yesterday's treatment session.  Pt able to demonstrate global 4+/5 strength in all major muscle groups involved with left knee.  Pt able to navigate four steps with reciprocal pattern and use of one hand rail.  Pt has met all of this goals at this time with exception of his ROM goal which I think he will have made progress towards at his next session.  Pt able to tolerate increased weight/resistance with all seated exercises today as well as increased step height with standing lunges.  Normal responses to estim and vaso noted upon removal.  Pt denied any pain at completion of today's treatment session.  Pt ready for discharge at next session.  OBJECTIVE IMPAIRMENTS: Abnormal gait, decreased activity tolerance, difficulty walking, decreased ROM, decreased strength, and pain.   ACTIVITY LIMITATIONS: carrying, lifting, and locomotion level  PARTICIPATION LIMITATIONS: meal prep, cleaning, laundry, and yard work  PERSONAL FACTORS: 1 comorbidity: RA  are also affecting patient's functional outcome.   REHAB POTENTIAL: Excellent  CLINICAL  DECISION MAKING: Stable/uncomplicated  EVALUATION COMPLEXITY: Low   GOALS: SHORT TERM GOALS: Target date: 12/27/22  Ind with  an initial HEP Goal status: MET  2.  Full active left knee extension.   5/8: -5 degrees; 5/22: -2 degrees Goal status: MET  3.  Active left knee flexion to 90 degrees.  Goal status: MET  LONG TERM GOALS: Target date: 03/13/23.  Ind with an advanced HEP.  Goal status: MET  2.  Active left knee flexion to 115 degrees+ so the patient can perform functional tasks and do so with pain not > 2-3/10.   5/8: 100 degrees; 5/28: 110 degrees Goal status: IN PROGRESS  3.  Increase left hip and knee strength to a solid 4+/5 to provide good stability for accomplishment of functional activities.  Goal status: MET  4.  Perform a reciprocating stair gait with one railing with pain not > 2-3/10.   5/28: step-to pattern with one rail, no pain Goal status: MET   PLAN:  PT FREQUENCY: 3x/week  PT DURATION: 4 weeks  PLANNED INTERVENTIONS: Therapeutic exercises, Therapeutic activity, Neuromuscular re-education, Gait training, Patient/Family education, Self Care, Stair training, Electrical stimulation, Cryotherapy, Moist heat, Vasopneumatic device, and Manual therapy  PLAN FOR NEXT SESSION: Nustep.  Progress into TKA protocol.  Vasopneumatic with LE elevation.   Newman Pies, PTA 01/17/2023, 10:22 AM

## 2023-01-22 ENCOUNTER — Ambulatory Visit: Payer: Medicare Other | Attending: Physician Assistant

## 2023-01-22 ENCOUNTER — Other Ambulatory Visit (HOSPITAL_COMMUNITY): Payer: Self-pay

## 2023-01-22 DIAGNOSIS — M25662 Stiffness of left knee, not elsewhere classified: Secondary | ICD-10-CM | POA: Diagnosis not present

## 2023-01-22 DIAGNOSIS — M25562 Pain in left knee: Secondary | ICD-10-CM | POA: Diagnosis not present

## 2023-01-22 DIAGNOSIS — M6281 Muscle weakness (generalized): Secondary | ICD-10-CM | POA: Diagnosis not present

## 2023-01-22 DIAGNOSIS — G8929 Other chronic pain: Secondary | ICD-10-CM | POA: Diagnosis not present

## 2023-01-22 NOTE — Therapy (Addendum)
OUTPATIENT PHYSICAL THERAPY LOWER EXTREMITY TREATMENT   Patient Name: Daniel Griffin MRN: [id number] DOB:09-Oct-1960, 62 y.o., male Today's Date: 01/22/2023  END OF SESSION:  PT End of Session - 01/22/23 0841     Visit Number 12    Number of Visits 12    Date for PT Re-Evaluation 01/10/23    Authorization Type FOTO AT LEAST EVERY 5TH VISIT.  PROGRESS NOTE AT 10TH VISIT.  KX MODIFIER AFTER 15 VISITS.    PT Start Time 0840    PT Stop Time 0910    PT Time Calculation (min) 30 min    Activity Tolerance Patient tolerated treatment well    Behavior During Therapy WFL for tasks assessed/performed             Past Medical History:  Diagnosis Date   Arthritis    RA   Depression    Seizures (HCC) 06/21/2020   ETOH w/d related   Past Surgical History:  Procedure Laterality Date   ANKLE FRACTURE SURGERY     Left   APPENDECTOMY     IR 3D INDEPENDENT WKST  09/01/2021   IR 3D INDEPENDENT WKST  12/14/2021   IR ANGIO INTRA EXTRACRAN SEL INTERNAL CAROTID BILAT MOD SED  09/01/2021   IR ANGIO INTRA EXTRACRAN SEL INTERNAL CAROTID UNI R MOD SED  12/14/2021   IR ANGIO VERTEBRAL SEL VERTEBRAL BILAT MOD SED  09/01/2021   IR ANGIOGRAM FOLLOW UP STUDY  12/14/2021   IR ANGIOGRAM FOLLOW UP STUDY  12/14/2021   IR ANGIOGRAM FOLLOW UP STUDY  12/14/2021   IR CT HEAD LTD  12/14/2021   IR TRANSCATH/EMBOLIZ  12/14/2021   IR US GUIDE VASC ACCESS RIGHT  09/01/2021   IR US GUIDE VASC ACCESS RIGHT  12/14/2021   RADIOLOGY WITH ANESTHESIA N/A 12/14/2021   Procedure: IR WITH ANESTHESIA EMBOLIZATION;  Surgeon: Baldemar Lenis, MD;  Location: [facility] OR;  Service: Radiology;  Laterality: N/A;   REVERSE SHOULDER ARTHROPLASTY Left 05/13/2021   Procedure: REVERSE SHOULDER ARTHROPLASTY;  Surgeon: Beverely Low, MD;  Location: [facility] ORS;  Service: Orthopedics;  Laterality: Left;  with ISB   REVERSE SHOULDER ARTHROPLASTY Right 10/14/2021   Procedure: REVERSE SHOULDER ARTHROPLASTY;  Surgeon: Beverely Low, MD;   Location: [facility] ORS;  Service: Orthopedics;  Laterality: Right;  with ISB   Patient Active Problem List   Diagnosis Date Noted   Body mass index 30.0-30.9, adult 08/03/2022   Brain aneurysm 12/14/2021   H/O total shoulder replacement, 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  THERAPY DIAG:  Chronic pain of left knee  Stiffness of left knee, not elsewhere classified  Muscle weakness (generalized)  Rationale for Evaluation and Treatment: Rehabilitation  ONSET DATE: Surgery, 12/11/22.  SUBJECTIVE:   SUBJECTIVE STATEMENT:  Pt denies any pain today.  Ready for discharge.  PERTINENT HISTORY: RA, bilateral TSA's. PAIN:  Are you having pain? No  PRECAUTIONS: Other: No ultrasound.  WEIGHT BEARING RESTRICTIONS: No  FALLS:  Has patient fallen in last 6 months? No.  LIVING ENVIRONMENT: Lives with: lives alone Lives in: House/apartment Stairs:  Has a ramp. Has following equipment at home: Dan Humphreys - 2 wheeled  OCCUPATION: Retired.  PLOF: Independent  PATIENT GOALS: Not have left knee pain.   OBJECTIVE:  PATIENT SURVEYS:  FOTO 28.  EDEMA:  Bulky dressing and ACE wrap donned.  PALPATION: C/o diffuse left knee pain.  LOWER EXTREMITY ROM: -5 degrees of left knee extension and flexion to 70 degrees.  LOWER EXTREMITY MMT:  Patient currently unable to perform a left SLR and SAQ.  TRANSFERS:  Sit to supine and supine to sit with patient using right LE to assist left LE.   GAIT: Step-to gait pattern with a FWW.  TODAY'S TREATMENT:                                                                                                                              DATE:                                     EXERCISE LOG  Exercise Repetitions and Resistance Comments  Recumbent Bike Lvl 3 x 16 mins; seat 5   Lunges 14" box x 4 mins   Rockerboard 4 mins   Forward Step Ups 6" box x 20 reps   LAQs 3# x25 reps   Seated Marches 3# x25 reps   [employer] x30 reps   Clams Green x25 reps   Ham Curls [employer] x25 reps     PATIENT EDUCATION:  Education details: Reviewed HEP. Person educated: Patient Education method: Explanation Education comprehension: verbalized understanding  HOME EXERCISE PROGRAM:   ASSESSMENT:  CLINICAL IMPRESSION: Pt arrives for today's treatment session denying any pain.  Pt able to increase FOTO score to 77 today.  Pt able to demonstrate 121 degrees of active left knee flexion today meeting his LTG. Pt has met all of his goals set forth for him by physical therapy at this time.  Pt encouraged to call the facility with any questions or concerns.  Pt declined estim and vaso today.  Pt denied any pain at completion of today's treatment session.  Pt ready for discharge at this time.  OBJECTIVE IMPAIRMENTS: Abnormal gait, decreased activity tolerance, difficulty walking, decreased ROM, decreased strength, and pain.   ACTIVITY LIMITATIONS: carrying, lifting, and locomotion level  PARTICIPATION LIMITATIONS: meal prep, cleaning, laundry, and yard work  PERSONAL FACTORS: 1 comorbidity: RA  are also affecting patient's functional outcome.   REHAB POTENTIAL: Excellent  CLINICAL DECISION MAKING: Stable/uncomplicated  EVALUATION COMPLEXITY: Low   GOALS: SHORT TERM GOALS: Target date: 12/27/22  Ind with an initial HEP Goal status: MET  2.  Full active left knee extension.   5/8: -5 degrees; 5/22: -2 degrees Goal status: MET  3.  Active left knee flexion to 90 degrees.  Goal status: MET  LONG TERM GOALS: Target date: 03/13/23.  Ind with an advanced HEP.  Goal status: MET  2.  Active left knee flexion to 115 degrees+ so the patient can perform functional  tasks and do so with pain not > 2-3/10.  5/8: 100 degrees; 5/28: 110  degrees; 6/3: 121 degrees Goal status: MET  3.  Increase left hip and knee strength to a solid 4+/5 to provide good stability for accomplishment of functional activities.  Goal status: MET  4.  Perform a reciprocating stair gait with one railing with pain not > 2-3/10.   5/28: step-to pattern with one rail, no pain Goal status: MET   PLAN:  PT FREQUENCY: 3x/week  PT DURATION: 4 weeks  PLANNED INTERVENTIONS: Therapeutic exercises, Therapeutic activity, Neuromuscular re-education, Gait training, Patient/Family education, Self Care, Stair training, Electrical stimulation, Cryotherapy, Moist heat, Vasopneumatic device, and Manual therapy  PLAN FOR NEXT SESSION: Nustep.  Progress into TKA protocol.  Vasopneumatic with LE elevation.   Newman Pies, PTA 01/22/2023, 9:14 AM     PHYSICAL THERAPY DISCHARGE SUMMARY  Visits from Start of Care: 12.  Current functional level related to goals / functional outcomes: See above.   Remaining deficits: All goals met.   Education / Equipment: HEP.   Patient agrees to discharge. Patient goals were met. Patient is being discharged due to meeting the stated rehab goals.    Italy Applegate MPT

## 2023-01-24 NOTE — Telephone Encounter (Signed)
ATC patient's sister about [employer] paperwork status, left voicemail for call back.

## 2023-01-26 ENCOUNTER — Other Ambulatory Visit (HOSPITAL_COMMUNITY): Payer: Self-pay

## 2023-01-30 NOTE — Telephone Encounter (Signed)
Received signed patient forms. Submitted Patient Assistance Application to [employer] for [facility] along with provider portion, pt portion, PA, med list, and insurance card copy. Income documents were not dropped off by the pt. Will update patient when we receive a response.  Phone #: [phone] Fax #: [phone]  Chesley Mires, PharmD, MPH, BCPS, CPP Clinical Pharmacist (Rheumatology and Pulmonology)

## 2023-02-01 NOTE — Telephone Encounter (Signed)
Received fax from [employer] for [employer]Q patient assistance, patient's application has been DENIED due to requiring low income subsidy determination and rx out of pocket expenses proof.    Phone #: [phone] Fax #: [phone]  Contacted [name] and application has been emailed to her for low income subsidy. If denied LIS, we will need copy of denial letter to submit to PAP. If approved for LIS medication will become affordable.   Darolyn Rua, PharmD Student [employer]

## 2023-02-02 ENCOUNTER — Encounter: Payer: Self-pay | Admitting: Nurse Practitioner

## 2023-02-02 ENCOUNTER — Ambulatory Visit (INDEPENDENT_AMBULATORY_CARE_PROVIDER_SITE_OTHER): Payer: Medicare Other | Admitting: Nurse Practitioner

## 2023-02-02 VITALS — BP 155/89 | HR 72 | Temp 97.3°F | Resp 20 | Ht 67.0 in | Wt 179.0 lb

## 2023-02-02 DIAGNOSIS — F03B4 Unspecified dementia, moderate, with anxiety: Secondary | ICD-10-CM | POA: Diagnosis not present

## 2023-02-02 DIAGNOSIS — L989 Disorder of the skin and subcutaneous tissue, unspecified: Secondary | ICD-10-CM

## 2023-02-02 DIAGNOSIS — I671 Cerebral aneurysm, nonruptured: Secondary | ICD-10-CM | POA: Diagnosis not present

## 2023-02-02 DIAGNOSIS — I10 Essential (primary) hypertension: Secondary | ICD-10-CM

## 2023-02-02 DIAGNOSIS — Z6828 Body mass index (BMI) 28.0-28.9, adult: Secondary | ICD-10-CM

## 2023-02-02 DIAGNOSIS — Z125 Encounter for screening for malignant neoplasm of prostate: Secondary | ICD-10-CM

## 2023-02-02 DIAGNOSIS — Z79899 Other long term (current) drug therapy: Secondary | ICD-10-CM | POA: Diagnosis not present

## 2023-02-02 DIAGNOSIS — M0579 Rheumatoid arthritis with rheumatoid factor of multiple sites without organ or systems involvement: Secondary | ICD-10-CM | POA: Diagnosis not present

## 2023-02-02 MED ORDER — LISINOPRIL 20 MG PO TABS: 20.0000 mg | ORAL_TABLET | Freq: Every day | ORAL | 3 refills | Status: DC

## 2023-02-02 NOTE — Progress Notes (Signed)
Subjective:    Patient ID: Daniel Griffin, male    DOB: [birth year], 62 y.o.   MRN: [id number]   Chief Complaint: medical management of chronic issues     HPI:  Daniel Griffin is a 62 y.o. who identifies as a male who was assigned male at birth.   Social history: Lives with: by hisself- his daughter checks on him daily Work history: retired   Water engineer in today for follow up of the following chronic medical issues:  1. Moderate dementia with anxiety, unspecified dementia type (HCC) Is not on any meds. He feels he is the same- no family here to ask questions.  2. Brain aneurysm No permanent effects . Denies headaches  3. Rheumatoid arthritis involving multiple sites with positive rheumatoid factor (HCC) 4. High risk medications (not anticoagulants) long-term use Is doing well. No fever or signs of infection Sees rheumatology every 6 months  5. Body mass index 30.0-30.9, adult Weight is down 3lbs Wt Readings from Last 3 Encounters:  02/02/23 179 lb (81.2 kg)  12/06/22 182 lb 3.2 oz (82.6 kg)  10/30/22 190 lb (86.2 kg)   BMI Readings from Last 3 Encounters:  02/02/23 28.04 kg/m  12/06/22 28.54 kg/m  10/30/22 29.76 kg/m   BP Readings from Last 3 Encounters:  02/02/23 (!) 155/89  12/06/22 (!) 147/76  10/30/22 133/70     New complaints: Has started drinking again- says he does not drink every day.  No Known Allergies Outpatient Encounter Medications as of 02/02/2023  Medication Sig   Abatacept (ORENCIA CLICKJECT) 125 MG/ML SOAJ INJECT 125 MG INTO THE SKIN ONCE A WEEK.   buPROPion (WELLBUTRIN XL) 150 MG 24 hr tablet TAKE 1 TABLET BY MOUTH EVERY DAY   escitalopram (LEXAPRO) 10 MG tablet Take 1 tablet (10 mg total) by mouth daily.   No facility-administered encounter medications on file as of 02/02/2023.    Past Surgical History:  Procedure Laterality Date   ANKLE FRACTURE SURGERY     Left   APPENDECTOMY     IR 3D INDEPENDENT WKST  09/01/2021   IR 3D  INDEPENDENT WKST  12/14/2021   IR ANGIO INTRA EXTRACRAN SEL INTERNAL CAROTID BILAT MOD SED  09/01/2021   IR ANGIO INTRA EXTRACRAN SEL INTERNAL CAROTID UNI R MOD SED  12/14/2021   IR ANGIO VERTEBRAL SEL VERTEBRAL BILAT MOD SED  09/01/2021   IR ANGIOGRAM FOLLOW UP STUDY  12/14/2021   IR ANGIOGRAM FOLLOW UP STUDY  12/14/2021   IR ANGIOGRAM FOLLOW UP STUDY  12/14/2021   IR CT HEAD LTD  12/14/2021   IR TRANSCATH/EMBOLIZ  12/14/2021   IR US GUIDE VASC ACCESS RIGHT  09/01/2021   IR US GUIDE VASC ACCESS RIGHT  12/14/2021   RADIOLOGY WITH ANESTHESIA N/A 12/14/2021   Procedure: IR WITH ANESTHESIA EMBOLIZATION;  Surgeon: Baldemar Lenis, MD;  Location: [facility] OR;  Service: Radiology;  Laterality: N/A;   REVERSE SHOULDER ARTHROPLASTY Left 05/13/2021   Procedure: REVERSE SHOULDER ARTHROPLASTY;  Surgeon: Beverely Low, MD;  Location: [facility] ORS;  Service: Orthopedics;  Laterality: Left;  with ISB   REVERSE SHOULDER ARTHROPLASTY Right 10/14/2021   Procedure: REVERSE SHOULDER ARTHROPLASTY;  Surgeon: Beverely Low, MD;  Location: [facility];  Service: Orthopedics;  Laterality: Right;  with ISB    Family History  Problem Relation Age of Onset   Alzheimer's disease Father    Healthy Son    Healthy Daughter    Rheum arthritis Daughter    Healthy Daughter  Controlled substance contract: n/a     Review of Systems  Constitutional:  Negative for diaphoresis.  Eyes:  Negative for pain.  Respiratory:  Negative for shortness of breath.   Cardiovascular:  Negative for chest pain, palpitations and leg swelling.  Gastrointestinal:  Negative for abdominal pain.  Endocrine: Negative for polydipsia.  Skin:  Negative for rash.  Neurological:  Negative for dizziness, weakness and headaches.  Hematological:  Does not bruise/bleed easily.  All other systems reviewed and are negative.      Objective:   Physical Exam Vitals and nursing note reviewed.  Constitutional:      Appearance: Normal appearance. He is  well-developed.  HENT:     Head: Normocephalic.     Nose: Nose normal.     Mouth/Throat:     Mouth: Mucous membranes are moist.     Pharynx: Oropharynx is clear.  Eyes:     Pupils: Pupils are equal, round, and reactive to light.  Neck:     Thyroid: No thyroid mass or thyromegaly.     Vascular: No carotid bruit or JVD.     Trachea: Phonation normal.  Cardiovascular:     Rate and Rhythm: Normal rate and regular rhythm.  Pulmonary:     Effort: Pulmonary effort is normal. No respiratory distress.     Breath sounds: Normal breath sounds.  Abdominal:     General: Bowel sounds are normal.     Palpations: Abdomen is soft.     Tenderness: There is no abdominal tenderness.  Musculoskeletal:        General: Normal range of motion.     Cervical back: Normal range of motion and neck supple.  Lymphadenopathy:     Cervical: No cervical adenopathy.  Skin:    General: Skin is warm and dry.     Comments: 3cm raised lesion left cheek  Neurological:     Mental Status: He is alert and oriented to person, place, and time.  Psychiatric:        Behavior: Behavior normal.        Thought Content: Thought content normal.        Judgment: Judgment normal.    BP (!) 155/89   Pulse 72   Temp (!) 97.3 F (36.3 C) (Temporal)   Resp 20   Ht 5\' 7"  (1.702 m)   Wt 179 lb (81.2 kg)   SpO2 100%   BMI 28.04 kg/m          Assessment & Plan:  [name] in today with chief complaint of Medical Management of Chronic Issues   1. Moderate dementia with anxiety, unspecified dementia type (HCC) Puzzle books or read daily - brain exercises  2. Brain aneurysm Report any headaches with visual changes  3. Rheumatoid arthritis involving multiple sites with positive rheumatoid factor (HCC) Keep follow up with rheumatology  4. High risk medications (not anticoagulants) long-term use Avoid being around sickpeople  5. BMI 28.0-28.9,adult Discussed diet and exercise for person with BMI >25 Will  recheck weight in 3-6 months   6. Facial lesion Referral to derm Do not pick at area Sunscreen encouraged - Ambulatory referral to Dermatology  7. Hypertension, primary New onset Start on lisinopril 20mg  daily' have daughter check blood pressure at h ome - lisinopril (ZESTRIL) 20 MG tablet; Take 1 tablet (20 mg total) by mouth daily.  Dispense: 90 tablet; Refill: 3 - CBC with Differential/Platelet - CMP14+EGFR - Lipid panel  8. Prostate cancer screening Labs pending -  PSA, total and free    The above assessment and management plan was discussed with the patient. The patient verbalized understanding of and has agreed to the management plan. Patient is aware to call the clinic if symptoms persist or worsen. Patient is aware when to return to the clinic for a follow-up visit. Patient educated on when it is appropriate to go to the emergency department.   Mary-Margaret Daphine Deutscher, FNP

## 2023-02-02 NOTE — Patient Instructions (Signed)

## 2023-02-03 LAB — CBC WITH DIFFERENTIAL/PLATELET
Basophils Absolute: 0.1 10*3/uL (ref 0.0–0.2)
Basos: 1 %
EOS (ABSOLUTE): 0 10*3/uL (ref 0.0–0.4)
Eos: 1 %
Hematocrit: 41.6 % (ref 37.5–51.0)
Hemoglobin: 14.2 g/dL (ref 13.0–17.7)
Immature Grans (Abs): 0 10*3/uL (ref 0.0–0.1)
Immature Granulocytes: 1 %
Lymphocytes Absolute: 1.2 10*3/uL (ref 0.7–3.1)
Lymphs: 19 %
MCH: 31.6 pg (ref 26.6–33.0)
MCHC: 34.1 g/dL (ref 31.5–35.7)
MCV: 93 fL (ref 79–97)
Monocytes Absolute: 0.8 10*3/uL (ref 0.1–0.9)
Monocytes: 12 %
Neutrophils Absolute: 4.4 10*3/uL (ref 1.4–7.0)
Neutrophils: 66 %
Platelets: 274 10*3/uL (ref 150–450)
RBC: 4.49 x10E6/uL (ref 4.14–5.80)
RDW: 13.2 % (ref 11.6–15.4)
WBC: 6.5 10*3/uL (ref 3.4–10.8)

## 2023-02-03 LAB — PSA, TOTAL AND FREE
PSA, Free Pct: 16.5 %
PSA, Free: 0.51 ng/mL
Prostate Specific Ag, Serum: 3.1 ng/mL (ref 0.0–4.0)

## 2023-02-03 LAB — CMP14+EGFR
ALT: 29 IU/L (ref 0–44)
AST: 30 IU/L (ref 0–40)
Albumin/Globulin Ratio: 1.6
Albumin: 4.2 g/dL (ref 3.9–4.9)
Alkaline Phosphatase: 94 IU/L (ref 44–121)
BUN/Creatinine Ratio: 9 — ABNORMAL LOW (ref 10–24)
BUN: 6 mg/dL — ABNORMAL LOW (ref 8–27)
Bilirubin Total: 0.7 mg/dL (ref 0.0–1.2)
CO2: 25 mmol/L (ref 20–29)
Calcium: 9.2 mg/dL (ref 8.6–10.2)
Chloride: 98 mmol/L (ref 96–106)
Creatinine, Ser: 0.7 mg/dL — ABNORMAL LOW (ref 0.76–1.27)
Globulin, Total: 2.6 g/dL (ref 1.5–4.5)
Glucose: 110 mg/dL — ABNORMAL HIGH (ref 70–99)
Potassium: 4.4 mmol/L (ref 3.5–5.2)
Sodium: 137 mmol/L (ref 134–144)
Total Protein: 6.8 g/dL (ref 6.0–8.5)
eGFR: 104 mL/min/{1.73_m2} (ref >59)

## 2023-02-03 LAB — LIPID PANEL
Chol/HDL Ratio: 1.5 ratio (ref 0.0–5.0)
Cholesterol, Total: 172 mg/dL (ref 100–199)
HDL: 115 mg/dL (ref >39)
LDL Chol Calc (NIH): 49 mg/dL (ref 0–99)
Triglycerides: 35 mg/dL (ref 0–149)
VLDL Cholesterol Cal: 8 mg/dL (ref 5–40)

## 2023-02-07 ENCOUNTER — Other Ambulatory Visit: Payer: Self-pay | Admitting: *Deleted

## 2023-02-07 DIAGNOSIS — Z79899 Other long term (current) drug therapy: Secondary | ICD-10-CM | POA: Diagnosis not present

## 2023-02-07 LAB — CBC WITH DIFFERENTIAL/PLATELET
Absolute Monocytes: 896 cells/uL (ref 200–950)
Basophils Absolute: 40 cells/uL (ref 0–200)
Basophils Relative: 0.5 %
Eosinophils Absolute: 152 cells/uL (ref 15–500)
Eosinophils Relative: 1.9 %
HCT: 44.4 % (ref 38.5–50.0)
Hemoglobin: 14.8 g/dL (ref 13.2–17.1)
Lymphs Abs: 2288 cells/uL (ref 850–3900)
MCH: 31.2 pg (ref 27.0–33.0)
MCHC: 33.3 g/dL (ref 32.0–36.0)
MCV: 93.5 fL (ref 80.0–100.0)
MPV: 9.5 fL (ref 7.5–12.5)
Monocytes Relative: 11.2 %
Neutro Abs: 4624 cells/uL (ref 1500–7800)
Neutrophils Relative %: 57.8 %
Platelets: 313 10*3/uL (ref 140–400)
RBC: 4.75 10*6/uL (ref 4.20–5.80)
RDW: 13.1 % (ref 11.0–15.0)
Total Lymphocyte: 28.6 %
WBC: 8 10*3/uL (ref 3.8–10.8)

## 2023-02-07 LAB — COMPLETE METABOLIC PANEL WITH GFR
AG Ratio: 1.5 (calc) (ref 1.0–2.5)
ALT: 20 U/L (ref 9–46)
AST: 21 U/L (ref 10–35)
Albumin: 4.4 g/dL (ref 3.6–5.1)
Alkaline phosphatase (APISO): 83 U/L (ref 35–144)
BUN/Creatinine Ratio: 11 (calc) (ref 6–22)
BUN: 7 mg/dL (ref 7–25)
CO2: 29 mmol/L (ref 20–32)
Calcium: 9.6 mg/dL (ref 8.6–10.3)
Chloride: 98 mmol/L (ref 98–110)
Creat: 0.66 mg/dL — ABNORMAL LOW (ref 0.70–1.35)
Globulin: 2.9 g/dL (calc) (ref 1.9–3.7)
Glucose, Bld: 100 mg/dL — ABNORMAL HIGH (ref 65–99)
Potassium: 4.8 mmol/L (ref 3.5–5.3)
Sodium: 137 mmol/L (ref 135–146)
Total Bilirubin: 0.8 mg/dL (ref 0.2–1.2)
Total Protein: 7.3 g/dL (ref 6.1–8.1)
eGFR: 106 mL/min/{1.73_m2} (ref >=60)

## 2023-02-07 NOTE — Progress Notes (Signed)
Creatinine remains borderline low-likely related to hydration status.  Not of concern at this time. Rest of CMP WNL

## 2023-02-07 NOTE — Progress Notes (Signed)
CBC WNL

## 2023-02-15 NOTE — Telephone Encounter (Signed)
Benna Dunks spoke with patient's sister [name]. They applied for LIS on 02/14/23 with no determination. She stated patient was frustrated with process. She will reach out once they receive a determination from LIS  Chesley Mires, PharmD, MPH, BCPS, CPP Clinical Pharmacist (Rheumatology and Pulmonology)

## 2023-02-19 HISTORY — PX: REPLACEMENT TOTAL KNEE: SUR1224

## 2023-03-07 ENCOUNTER — Telehealth: Payer: Self-pay

## 2023-03-07 NOTE — Telephone Encounter (Signed)
   Pre-operative Risk Assessment    Patient Name: Daniel Griffin  DOB: 1961/03/11 MRN: [id number]      Request for Surgical Clearance    Procedure:   Right total knee arthroplasty  Date of Surgery:  Clearance TBD                                 Surgeon:  Dr. Malon Kindle Surgeon's Group or Practice Name:  [facility] Phone number:  [phone] Fax number:  [phone]   Type of Clearance Requested:   - Medical ; no medications listed to be held   Type of Anesthesia:  Spinal   Additional requests/questions:    Daniel Griffin   03/07/2023, 1:38 PM

## 2023-03-07 NOTE — Telephone Encounter (Signed)
Attempted to contact patient on all listed phone numbers to schedule pre-op tele visit. Unable to get in direct contact with the patient. His sister phone number [phone] states that the patient's phone is currently off. Patient's sister states to call her back tomorrow morning as she is currently unavailable.

## 2023-03-07 NOTE — Telephone Encounter (Signed)
   Name: Daniel Griffin  DOB: 1960/11/18  MRN: [id number]  Primary Cardiologist: Maisie Fus, MD   Preoperative team, please contact this patient and set up a phone call appointment for further preoperative risk assessment. Please obtain consent and complete medication review. Thank you for your help.  I confirm that guidance regarding antiplatelet and oral anticoagulation therapy has been completed and, if necessary, noted below.   None requested  Ronney Asters, NP 03/07/2023, 1:58 PM [facility]

## 2023-03-08 ENCOUNTER — Telehealth: Payer: Self-pay

## 2023-03-08 NOTE — Telephone Encounter (Signed)
Spoke with the patients daughter [place] and got telehealth appointment scheduled. She states the patients phone is disconnected and the best way to reach the patient is through her. She states she will go to the patients house for his telehealth appointment. Med list and consent given.

## 2023-03-08 NOTE — Telephone Encounter (Signed)
Patient's daughter is returning phone call.  °

## 2023-03-08 NOTE — Telephone Encounter (Signed)
Attempted to reach the patient but his number is not valaid. Contacted daughter, Decorey Wahlert, emergency contact, but no answer; left message for her to have the patient contact the office.

## 2023-03-08 NOTE — Telephone Encounter (Signed)
  Patient Consent for Virtual Visit        Daniel Griffin has provided verbal consent on 03/08/2023 for a virtual visit (video or telephone).   CONSENT FOR VIRTUAL VISIT FOR:  Daniel Griffin  By participating in this virtual visit I agree to the following:  I hereby voluntarily request, consent and authorize [facility] and its employed or contracted physicians, physician assistants, nurse practitioners or other licensed health care professionals (the Practitioner), to provide me with telemedicine health care services (the "Services") as deemed necessary by the treating Practitioner. I acknowledge and consent to receive the Services by the Practitioner via telemedicine. I understand that the telemedicine visit will involve communicating with the Practitioner through live audiovisual communication technology and the disclosure of certain medical information by electronic transmission. I acknowledge that I have been given the opportunity to request an in-person assessment or other available alternative prior to the telemedicine visit and am voluntarily participating in the telemedicine visit.  I understand that I have the right to withhold or withdraw my consent to the use of telemedicine in the course of my care at any time, without affecting my right to future care or treatment, and that the Practitioner or I may terminate the telemedicine visit at any time. I understand that I have the right to inspect all information obtained and/or recorded in the course of the telemedicine visit and may receive copies of available information for a reasonable fee.  I understand that some of the potential risks of receiving the Services via telemedicine include:  Delay or interruption in medical evaluation due to technological equipment failure or disruption; Information transmitted may not be sufficient (e.g. poor resolution of images) to allow for appropriate medical decision making by the  Practitioner; and/or  In rare instances, security protocols could fail, causing a breach of personal health information.  Furthermore, I acknowledge that it is my responsibility to provide information about my medical history, conditions and care that is complete and accurate to the best of my ability. I acknowledge that Practitioner's advice, recommendations, and/or decision may be based on factors not within their control, such as incomplete or inaccurate data provided by me or distortions of diagnostic images or specimens that may result from electronic transmissions. I understand that the practice of medicine is not an exact science and that Practitioner makes no warranties or guarantees regarding treatment outcomes. I acknowledge that a copy of this consent can be made available to me via my patient portal [facility] MyChart), or I can request a printed copy by calling the office of [facility].    I understand that my insurance will be billed for this visit.   I have read or had this consent read to me. I understand the contents of this consent, which adequately explains the benefits and risks of the Services being provided via telemedicine.  I have been provided ample opportunity to ask questions regarding this consent and the Services and have had my questions answered to my satisfaction. I give my informed consent for the services to be provided through the use of telemedicine in my medical care

## 2023-03-12 NOTE — Telephone Encounter (Signed)
Daniel Griffin spoke w patient's sister, Daniel Griffin. They received denial for LIS but have not yet received letter from department. She has ben advised to drop off denial letter once received.

## 2023-03-19 ENCOUNTER — Ambulatory Visit: Payer: Medicare Other | Attending: Cardiovascular Disease

## 2023-03-19 DIAGNOSIS — Z01810 Encounter for preprocedural cardiovascular examination: Secondary | ICD-10-CM | POA: Diagnosis not present

## 2023-03-19 NOTE — Progress Notes (Signed)
Virtual Visit via Telephone Note   Because of [name] co-morbid illnesses, he is at least at moderate risk for complications without adequate follow up.  This format is felt to be most appropriate for this patient at this time.  The patient did not have access to video technology/had technical difficulties with video requiring transitioning to audio format only (telephone).  All issues noted in this document were discussed and addressed.  No physical exam could be performed with this format.  Please refer to the patient's chart for his consent to telehealth for [facility].  Evaluation Performed:  Preoperative cardiovascular risk assessment _____________   Date:  03/19/2023   Patient ID:  Daniel Griffin, DOB [birth year], MRN [id number] Patient Location:  Home Provider location:   Office  Primary Care Provider:  Bennie Pierini, FNP Primary Cardiologist:  Maisie Fus, MD  Chief Complaint / Patient Profile   62 y.o. y/o male with a h/o right MCA brain aneurysm, dementia, EtOH abuse, rheumatoid arthritis, depression who is pending right total knee arthroplasty and presents today for telephonic preoperative cardiovascular risk assessment.  History of Present Illness    Daniel Griffin is a 62 y.o. male who presents via audio/video conferencing for a telehealth visit today.  Pt was last seen in cardiology clinic on 04/04/2022 by Dr. Carolan Clines.  At that time JUBAL ESHBAUGH was doing well with controlled blood pressure and no other cardiac complaints at that time.  The patient is now pending procedure as outlined above. Since his last visit, he has been doing well with no new cardiac complaints.  He denies chest pain, shortness of breath, lower extremity edema, fatigue, palpitations, melena, hematuria, hemoptysis, diaphoresis, weakness, presyncope, syncope, orthopnea, and PND.     Past Medical History    Past Medical History:  Diagnosis Date    Arthritis    RA   Depression    Seizures (HCC) 06/21/2020   ETOH w/d related   Past Surgical History:  Procedure Laterality Date   ANKLE FRACTURE SURGERY     Left   APPENDECTOMY     IR 3D INDEPENDENT WKST  09/01/2021   IR 3D INDEPENDENT WKST  12/14/2021   IR ANGIO INTRA EXTRACRAN SEL INTERNAL CAROTID BILAT MOD SED  09/01/2021   IR ANGIO INTRA EXTRACRAN SEL INTERNAL CAROTID UNI R MOD SED  12/14/2021   IR ANGIO VERTEBRAL SEL VERTEBRAL BILAT MOD SED  09/01/2021   IR ANGIOGRAM FOLLOW UP STUDY  12/14/2021   IR ANGIOGRAM FOLLOW UP STUDY  12/14/2021   IR ANGIOGRAM FOLLOW UP STUDY  12/14/2021   IR CT HEAD LTD  12/14/2021   IR TRANSCATH/EMBOLIZ  12/14/2021   IR US GUIDE VASC ACCESS RIGHT  09/01/2021   IR US GUIDE VASC ACCESS RIGHT  12/14/2021   RADIOLOGY WITH ANESTHESIA N/A 12/14/2021   Procedure: IR WITH ANESTHESIA EMBOLIZATION;  Surgeon: Baldemar Lenis, MD;  Location[facility];  Service: Radiology;  Laterality: N/A;   REVERSE SHOULDER ARTHROPLASTY Left 05/13/2021   Procedure: REVERSE SHOULDER ARTHROPLASTY;  Surgeon: Beverely Low, MD;  Location: [facility] ORS;  Service: Orthopedics;  Laterality: Left;  with ISB   REVERSE SHOULDER ARTHROPLASTY Right 10/14/2021   Procedure: REVERSE SHOULDER ARTHROPLASTY;  Surgeon: Beverely Low, MD;  Location: [facility] ORS;  Service: Orthopedics;  Laterality: Right;  with ISB    Allergies  No Known Allergies  Home Medications    Prior to Admission medications   Medication Sig Start Date End Date  Taking? Authorizing Provider  Abatacept (ORENCIA CLICKJECT) 125 MG/ML SOAJ INJECT 125 MG INTO THE SKIN ONCE A WEEK. 01/05/23   Gearldine Bienenstock, PA-C  buPROPion (WELLBUTRIN XL) 150 MG 24 hr tablet TAKE 1 TABLET BY MOUTH EVERY DAY 11/27/22   Daphine Deutscher, Mary-Margaret, FNP  escitalopram (LEXAPRO) 10 MG tablet Take 1 tablet (10 mg total) by mouth daily. 08/03/22   Daphine Deutscher, Mary-Margaret, FNP  lisinopril (ZESTRIL) 20 MG tablet Take 1 tablet (20 mg total) by mouth daily. 02/02/23   Bennie Pierini, FNP    Physical Exam    Vital Signs:  [name] does not have vital signs available for review today.  Given telephonic nature of communication, physical exam is limited. AAOx3. NAD. Normal affect.  Speech and respirations are unlabored.  Accessory Clinical Findings    None  Assessment & Plan    1.  Preoperative Cardiovascular Risk Assessment:  -Patient's RCRI score is 0.9%  The patient affirms he has been doing well without any new cardiac symptoms. They are able to achieve 5 METS without cardiac limitations. Therefore, based on ACC/AHA guidelines, the patient would be at acceptable risk for the planned procedure without further cardiovascular testing. The patient was advised that if he develops new symptoms prior to surgery to contact our office to arrange for a follow-up visit, and he verbalized understanding.   The patient was advised that if he develops new symptoms prior to surgery to contact our office to arrange for a follow-up visit, and he verbalized understanding.  No medications to hold  A copy of this note will be routed to requesting surgeon.  Time:   Today, I have spent 10 minutes with the patient with telehealth technology discussing medical history, symptoms, and management plan.     Napoleon Form, Leodis Rains, NP  03/19/2023, 7:52 AM

## 2023-04-02 ENCOUNTER — Ambulatory Visit (INDEPENDENT_AMBULATORY_CARE_PROVIDER_SITE_OTHER): Payer: Medicare Other | Admitting: Family Medicine

## 2023-04-02 ENCOUNTER — Encounter: Payer: Self-pay | Admitting: Family Medicine

## 2023-04-02 VITALS — BP 132/73 | HR 73 | Temp 97.4°F | Resp 20 | Ht 67.0 in | Wt 184.0 lb

## 2023-04-02 DIAGNOSIS — L819 Disorder of pigmentation, unspecified: Secondary | ICD-10-CM

## 2023-04-02 NOTE — Progress Notes (Signed)
   Acute Office Visit  Subjective:     Patient ID: Daniel Griffin, male    DOB: 02-15-1961, 62 y.o.   MRN: [id number]  Chief Complaint  Patient presents with   Rash    Rash   Patient is in today for skin lesion. He has one on his right forearm for the last 6 months. This has gotten larger over this time. Denies any pain, tenderness, itching, or exudate. He also has a lesion of the left side of face for about 3 months. This has gotten bigger over this time and is tender. He has never seen dermatology  Review of Systems  Skin:  Positive for rash.   As per HPI.     Objective:    BP 132/73   Pulse 73   Temp (!) 97.4 F (36.3 C) (Oral)   Resp 20   Ht 5\' 7"  (1.702 m)   Wt 184 lb (83.5 kg)   SpO2 93%   BMI 28.82 kg/m    Physical Exam Vitals and nursing note reviewed.  Constitutional:      General: He is not in acute distress.    Appearance: He is not ill-appearing, toxic-appearing or diaphoretic.  Pulmonary:     Effort: Pulmonary effort is normal. No respiratory distress.  Skin:    General: Skin is warm and dry.     Findings: Lesion (left side of face and right lower arm. Pictured below.) present.  Neurological:     Mental Status: He is alert and oriented to person, place, and time. Mental status is at baseline.        No results found for any visits on 04/02/23.      Assessment & Plan:   Daniel "Cyndra Numbers" was seen today for rash.  Diagnoses and all orders for this visit:  Atypical pigmented lesion Irregular raised lesions. One on face and one on arm. Pictures above. Concerning for skin cancer. Urgent dermatology referral discussed and ordered.  Return to office for new or worsening symptoms, or if symptoms persist.   The patient indicates understanding of these issues and agrees with the plan.  Gabriel Earing, FNP

## 2023-04-17 NOTE — Progress Notes (Signed)
Office Visit Note  Patient: Daniel Griffin             Date of Birth: 1961/03/17           MRN: [id number]             PCP: Bennie Pierini, FNP Referring: Bennie Pierini, * Visit Date: 04/30/2023 Occupation: @GUAROCC @  Subjective:  Right knee joint pain   History of Present Illness: Daniel Griffin is a 62 y.o. male with history of rheumatoid arthritis.  Patient was previously prescribed Orencia 125 mg sq injections once weekly but due to change of insurance he has not had a dose/refill of Orencia since May 2024.  Patient states that he had his left knee replaced at the end of April 2024 which has been doing well.  He states he is scheduled to have his right knee replaced on 05/14/23.  Patient states that he has qualified for disability and would like to discuss his treatment options going forward.  He denies any signs or symptoms of a rheumatoid arthritis flare recently but has ongoing stiffness in both hands every morning.    Activities of Daily Living:  Patient reports morning stiffness for 30-40 minutes  Patient Denies nocturnal pain.  Difficulty dressing/grooming: Denies Difficulty climbing stairs: Reports Difficulty getting out of chair: Reports Difficulty using hands for taps, buttons, cutlery, and/or writing: Denies  Review of Systems  Constitutional:  Negative for fatigue and night sweats.  HENT:  Negative for mouth sores, mouth dryness and nose dryness.   Eyes:  Negative for redness and dryness.  Respiratory:  Negative for cough, shortness of breath and difficulty breathing.   Cardiovascular:  Negative for chest pain, palpitations, hypertension, irregular heartbeat and swelling in legs/feet.  Gastrointestinal:  Negative for blood in stool, constipation and diarrhea.  Endocrine: Negative for increased urination.  Genitourinary:  Negative for painful urination.  Musculoskeletal:  Positive for joint pain, joint pain and morning stiffness. Negative for joint  swelling, myalgias, muscle weakness, muscle tenderness and myalgias.  Skin:  Negative for color change, rash, hair loss, nodules/bumps, skin tightness, ulcers and sensitivity to sunlight.  Allergic/Immunologic: Negative for susceptible to infections.  Neurological:  Negative for dizziness, fainting, memory loss, night sweats and weakness.  Hematological:  Negative for swollen glands.  Psychiatric/Behavioral:  Negative for depressed mood and sleep disturbance. The patient is not nervous/anxious.     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   Syphilis 09/21/2020   Dementia (HCC) 09/21/2020   Rheumatoid arthritis involving multiple sites with positive rheumatoid factor (HCC) 11/30/2016   High risk medications (not anticoagulants) long-term use 11/30/2016    Past Medical History:  Diagnosis Date   Arthritis    RA   Depression    Seizures (HCC) 06/21/2020   ETOH w/d related    Family History  Problem Relation Age of Onset   Alzheimer's disease Father    Healthy Son    Healthy Daughter    Rheum arthritis Daughter    Healthy Daughter    Past Surgical History:  Procedure Laterality Date   ANKLE FRACTURE SURGERY     Left   APPENDECTOMY     IR 3D INDEPENDENT WKST  09/01/2021   IR 3D INDEPENDENT WKST  12/14/2021   IR ANGIO INTRA EXTRACRAN SEL INTERNAL CAROTID BILAT MOD SED  09/01/2021  IR ANGIO INTRA EXTRACRAN SEL INTERNAL CAROTID UNI R MOD SED  12/14/2021   IR ANGIO VERTEBRAL SEL VERTEBRAL BILAT MOD SED  09/01/2021   IR ANGIOGRAM FOLLOW UP STUDY  12/14/2021   IR ANGIOGRAM FOLLOW UP STUDY  12/14/2021   IR ANGIOGRAM FOLLOW UP STUDY  12/14/2021   IR CT HEAD LTD  12/14/2021   IR TRANSCATH/EMBOLIZ  12/14/2021   IR US GUIDE VASC ACCESS RIGHT  09/01/2021   IR US GUIDE VASC ACCESS RIGHT  12/14/2021   RADIOLOGY WITH ANESTHESIA N/A  12/14/2021   Procedure: IR WITH ANESTHESIA EMBOLIZATION;  Surgeon: Baldemar Lenis, MD;  Location: [facility] OR;  Service: Radiology;  Laterality: N/A;   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   REVERSE SHOULDER ARTHROPLASTY Left 05/13/2021   Procedure: REVERSE SHOULDER ARTHROPLASTY;  Surgeon: Beverely Low, MD;  Location: [facility];  Service: Orthopedics;  Laterality: Left;  with ISB   REVERSE SHOULDER ARTHROPLASTY Right 10/14/2021   Procedure: REVERSE SHOULDER ARTHROPLASTY;  Surgeon: Beverely Low, MD;  Location: [facility];  Service: Orthopedics;  Laterality: Right;  with ISB   Social History   Social History Narrative   Lives alone   Right Handed   Rarely drinks caffeine       Immunization History  Administered Date(s) Administered   Influenza,inj,Quad PF,6+ Mos 07/04/2018, 08/03/2022     Objective: Vital Signs: BP 118/82 (BP Location: Left Arm, Patient Position: Sitting, Cuff Size: Normal)   Pulse 93   Resp 17   Ht 5\' 6"  (1.676 m)   Wt 186 lb 3.2 oz (84.5 kg)   BMI 30.05 kg/m    Physical Exam Vitals and nursing note reviewed.  Constitutional:      Appearance: He is well-developed.  HENT:     Head: Normocephalic and atraumatic.  Eyes:     Conjunctiva/sclera: Conjunctivae normal.     Pupils: Pupils are equal, round, and reactive to light.  Cardiovascular:     Rate and Rhythm: Normal rate and regular rhythm.     Heart sounds: Normal heart sounds.  Pulmonary:     Effort: Pulmonary effort is normal.     Breath sounds: Normal breath sounds.  Abdominal:     General: Bowel sounds are normal.     Palpations: Abdomen is soft.  Musculoskeletal:     Cervical back: Normal range of motion and neck supple.  Skin:    General: Skin is warm and dry.     Capillary Refill: Capillary refill takes less than 2 seconds.  Neurological:     Mental Status: He is alert and oriented to person, place, and time.  Psychiatric:        Behavior: Behavior normal.      Musculoskeletal Exam:  C-spine, thoracic spine, and lumbar spine good ROM.  Shoulder joint replacements have good ROM.  Elbow joints, wrist joints, MCPs, PIPs, and DIPs good ROM with no synovitis.  Complete fist formation. PIP and DIP thickening consistent with osteoarthritis of both hands.  Hip joints have good ROM.  Left knee replacement has warmth and slightly limited extension.  Right knee has painful ROM with warmth and swelling.  Ankle joints have good ROM with no tenderness or joint swelling.   CDAI Exam: CDAI Score: -- Patient Global: 30 / 100; Provider Global: 30 / 100 Swollen: --; Tender: -- Joint Exam 04/30/2023   No joint exam has been documented for this visit   There is currently no information documented on the homunculus. Go to the Rheumatology activity  and complete the homunculus joint exam.  Investigation: No additional findings.  Imaging: No results found.  Recent Labs: Lab Results  Component Value Date   WBC 8.0 02/07/2023   HGB 14.8 02/07/2023   PLT 313 02/07/2023   NA 137 02/07/2023   K 4.8 02/07/2023   CL 98 02/07/2023   CO2 29 02/07/2023   GLUCOSE 100 (H) 02/07/2023   BUN 7 02/07/2023   CREATININE 0.66 (L) 02/07/2023   BILITOT 0.8 02/07/2023   ALKPHOS 94 02/02/2023   AST 21 02/07/2023   ALT 20 02/07/2023   PROT 7.3 02/07/2023   ALBUMIN 4.2 02/02/2023   CALCIUM 9.6 02/07/2023   GFRAA 112 02/25/2021   QFTBGOLD NEGATIVE 07/03/2017   QFTBGOLDPLUS NEGATIVE 10/02/2022    Speciality Comments: Prior therapy includes: Enbrel (pancytopenia) and methotrexate (inadequate response) Orencia-07/09/18 Enbrel-1/19-10/19  Procedures:  No procedures performed Allergies: Patient has no known allergies.   Assessment / Plan:     Visit Diagnoses: Rheumatoid arthritis involving multiple sites with positive rheumatoid factor (HCC): No synovitis noted on examination today.  Patient's last dose of Orencia was administered in May 2024.  Patient had an insurance change and is now on  disability so he is unsure if he will be able to afford Orencia going forward.  He has noted some increased joint stiffness during the gap in therapy but otherwise has not had any recent flares.  He continues to have chronic pain in the right knee joint and is scheduled for a right total knee arthroplasty on 05/14/2023.  Plan to try applying for Orencia through his new insurance.  He will notify us once he has been cleared by his orthopedic surgeon to resume Orencia as prescribed.  High risk medications (not anticoagulants) long-term use - Orencia 125 mg sq injections once weekly. CBC and CMP updated on 02/07/23. Orders for CBC and CMP will be released today.   TB gold negative on 10/02/22.  Discussed the importance of holding orencia if she develops signs or symptoms of an infection and to resume once the infection has completely cleared.   - Plan: CBC with Differential/Platelet, COMPLETE METABOLIC PANEL WITH GFR  Status post replacement of both shoulder joints - - Reverse RTSR by Dr. Ranell Patrick on 10/14/21.RLTSR May 13, 2022.  Doing well.  Good range of motion with no discomfort.  Primary osteoarthritis of both hands: PIP and DIP thickening consistent with osteoarthritis of both hands.  No synovitis noted.  He experiences intermittent stiffness in both hands especially first thing in the morning.  S/P total knee arthroplasty, left - Under the care of Dr. Durwin Nora. Doing well.  Warmth but no effusion noted.   Primary osteoarthritis of right knee: Chronic pain. Scheduled for right total knee arthroplasty on 05/14/23.  Primary osteoarthritis of both feet: He is not experiencing any increased discomfort in his feet at this time.  Other medical conditions are listed as follows:   S/P coil embolization of cerebral aneurysm  Latent syphilis  History of alcohol abuse  Orders: Orders Placed This Encounter  Procedures   CBC with Differential/Platelet   COMPLETE METABOLIC PANEL WITH GFR   No orders  of the defined types were placed in this encounter.    Follow-Up Instructions: Return in about 3 months (around 07/30/2023) for Rheumatoid arthritis.   Gearldine Bienenstock, PA-C  Note - This record has been created using Dragon software.  Chart creation errors have been sought, but may not always  have been located. Such creation errors do not  reflect on  the standard of medical care.

## 2023-04-20 ENCOUNTER — Telehealth: Payer: Self-pay | Admitting: Nurse Practitioner

## 2023-04-20 NOTE — Telephone Encounter (Signed)
Daniel Griffin  dropped off DISABILITY forms to be completed and signed.  Form Fee Paid? (Y/N)       NO     If NO, form is placed on front office manager desk to hold until payment received. If YES, then form will be placed in the RX/HH Nurse Coordinators box for completion.  Form will not be processed until payment is received

## 2023-04-24 DIAGNOSIS — M25561 Pain in right knee: Secondary | ICD-10-CM | POA: Diagnosis not present

## 2023-04-24 DIAGNOSIS — Z0279 Encounter for issue of other medical certificate: Secondary | ICD-10-CM

## 2023-04-26 NOTE — Telephone Encounter (Signed)
PCP completed and signed Disability forms. They have been faxed to [facility] at fax number [phone]. Patient has been contacted and informed they are complete.

## 2023-04-30 ENCOUNTER — Encounter: Payer: Self-pay | Admitting: Physician Assistant

## 2023-04-30 ENCOUNTER — Ambulatory Visit: Payer: Medicare Other | Attending: Physician Assistant | Admitting: Physician Assistant

## 2023-04-30 VITALS — BP 118/82 | HR 93 | Resp 17 | Ht 66.0 in | Wt 186.2 lb

## 2023-04-30 DIAGNOSIS — M19071 Primary osteoarthritis, right ankle and foot: Secondary | ICD-10-CM | POA: Diagnosis not present

## 2023-04-30 DIAGNOSIS — Z79899 Other long term (current) drug therapy: Secondary | ICD-10-CM | POA: Diagnosis not present

## 2023-04-30 DIAGNOSIS — Z96612 Presence of left artificial shoulder joint: Secondary | ICD-10-CM | POA: Diagnosis not present

## 2023-04-30 DIAGNOSIS — Z96611 Presence of right artificial shoulder joint: Secondary | ICD-10-CM | POA: Diagnosis not present

## 2023-04-30 DIAGNOSIS — Z96652 Presence of left artificial knee joint: Secondary | ICD-10-CM | POA: Diagnosis not present

## 2023-04-30 DIAGNOSIS — M0579 Rheumatoid arthritis with rheumatoid factor of multiple sites without organ or systems involvement: Secondary | ICD-10-CM | POA: Diagnosis not present

## 2023-04-30 DIAGNOSIS — M1711 Unilateral primary osteoarthritis, right knee: Secondary | ICD-10-CM

## 2023-04-30 DIAGNOSIS — M19072 Primary osteoarthritis, left ankle and foot: Secondary | ICD-10-CM

## 2023-04-30 DIAGNOSIS — F1011 Alcohol abuse, in remission: Secondary | ICD-10-CM

## 2023-04-30 DIAGNOSIS — M19042 Primary osteoarthritis, left hand: Secondary | ICD-10-CM

## 2023-04-30 DIAGNOSIS — M19041 Primary osteoarthritis, right hand: Secondary | ICD-10-CM

## 2023-04-30 DIAGNOSIS — A530 Latent syphilis, unspecified as early or late: Secondary | ICD-10-CM

## 2023-04-30 DIAGNOSIS — Z9889 Other specified postprocedural states: Secondary | ICD-10-CM | POA: Diagnosis not present

## 2023-04-30 DIAGNOSIS — M170 Bilateral primary osteoarthritis of knee: Secondary | ICD-10-CM

## 2023-04-30 NOTE — Patient Instructions (Signed)
Standing Labs We placed an order today for your standing lab work.   Please have your standing labs drawn in December and every 3 months   Please have your labs drawn 2 weeks prior to your appointment so that the provider can discuss your lab results at your appointment, if possible.  Please note that you may see your imaging and lab results in MyChart before we have reviewed them. We will contact you once all results are reviewed. Please allow our office up to 72 hours to thoroughly review all of the results before contacting the office for clarification of your results.  WALK-IN LAB HOURS  Monday through Thursday from 8:00 am -12:30 pm and 1:00 pm-5:00 pm and Friday from 8:00 am-12:00 pm.  Patients with office visits requiring labs will be seen before walk-in labs.  You may encounter longer than normal wait times. Please allow additional time. Wait times may be shorter on  Monday and Thursday afternoons.  We do not book appointments for walk-in labs. We appreciate your patience and understanding with our staff.   Labs are drawn by [facility]. Please bring your co-pay at the time of your lab draw.  You may receive a bill from [facility] for your lab work.  Please note if you are on Hydroxychloroquine and and an order has been placed for a Hydroxychloroquine level,  you will need to have it drawn 4 hours or more after your last dose.  If you wish to have your labs drawn at another location, please call the office 24 hours in advance so we can fax the orders.  The office is located at [address], [place], [place] [address]   If you have any questions regarding directions or hours of operation,  please call [phone].   As a reminder, please drink plenty of water prior to coming for your lab work. Thanks!

## 2023-04-30 NOTE — Telephone Encounter (Signed)
Spoke with sister, [name]. Patient is coming to clinic for appt this afternoon. She states she believes they received denial letter for SS LIS. She will try to locate denial letter and send with patient so we can submit to Orencia PAP. Recommended he give to front desk to place on my desk  Chesley Mires, PharmD, MPH, BCPS, CPP Clinical Pharmacist (Rheumatology and Pulmonology)

## 2023-05-01 ENCOUNTER — Other Ambulatory Visit (HOSPITAL_COMMUNITY): Payer: Self-pay

## 2023-05-01 ENCOUNTER — Encounter: Payer: Self-pay | Admitting: Pharmacist

## 2023-05-01 LAB — CBC WITH DIFFERENTIAL/PLATELET
Absolute Monocytes: 1038 {cells}/uL — ABNORMAL HIGH (ref 200–950)
Basophils Absolute: 42 {cells}/uL (ref 0–200)
Basophils Relative: 0.7 %
Eosinophils Absolute: 72 {cells}/uL (ref 15–500)
Eosinophils Relative: 1.2 %
HCT: 44.9 % (ref 38.5–50.0)
Hemoglobin: 15.3 g/dL (ref 13.2–17.1)
Lymphs Abs: 1392 {cells}/uL (ref 850–3900)
MCH: 31.5 pg (ref 27.0–33.0)
MCHC: 34.1 g/dL (ref 32.0–36.0)
MCV: 92.4 fL (ref 80.0–100.0)
MPV: 9.8 fL (ref 7.5–12.5)
Monocytes Relative: 17.3 %
Neutro Abs: 3456 {cells}/uL (ref 1500–7800)
Neutrophils Relative %: 57.6 %
Platelets: 243 10*3/uL (ref 140–400)
RBC: 4.86 10*6/uL (ref 4.20–5.80)
RDW: 12.3 % (ref 11.0–15.0)
Total Lymphocyte: 23.2 %
WBC: 6 10*3/uL (ref 3.8–10.8)

## 2023-05-01 LAB — COMPLETE METABOLIC PANEL WITH GFR
AG Ratio: 1.1 (calc) (ref 1.0–2.5)
ALT: 34 U/L (ref 9–46)
AST: 43 U/L — ABNORMAL HIGH (ref 10–35)
Albumin: 3.9 g/dL (ref 3.6–5.1)
Alkaline phosphatase (APISO): 71 U/L (ref 35–144)
BUN/Creatinine Ratio: 9 (calc) (ref 6–22)
BUN: 6 mg/dL — ABNORMAL LOW (ref 7–25)
CO2: 25 mmol/L (ref 20–32)
Calcium: 9.4 mg/dL (ref 8.6–10.3)
Chloride: 96 mmol/L — ABNORMAL LOW (ref 98–110)
Creat: 0.68 mg/dL — ABNORMAL LOW (ref 0.70–1.35)
Globulin: 3.6 g/dL (ref 1.9–3.7)
Glucose, Bld: 83 mg/dL (ref 65–99)
Potassium: 4.5 mmol/L (ref 3.5–5.3)
Sodium: 134 mmol/L — ABNORMAL LOW (ref 135–146)
Total Bilirubin: 0.8 mg/dL (ref 0.2–1.2)
Total Protein: 7.5 g/dL (ref 6.1–8.1)
eGFR: 105 mL/min/{1.73_m2} (ref >=60)

## 2023-05-01 NOTE — Progress Notes (Signed)
Daniel Griffin--Patient has had a change in insurance and is now on disability.  His last dose of Orencia was in May 2024.  Please see if he will be able to restart Orencia   Absolute monocytes are slightly elevated-we will continue to monitor. Rest of CBC Wnl.  AST is borderline elevated-please clarify if he has been taking tylenol or alcohol use?  Rest of CMP stable.  He will require updated CBC and CMP 1 month after restarting orencia.

## 2023-05-01 NOTE — Telephone Encounter (Signed)
Copay for Orencia through [facility] Medicare for 28 day supply is still >$1000 despite. Will submit LIS denial letter to [employer]  Phone #: [phone] Fax #: [phone]  Chesley Mires, PharmD, MPH, BCPS, CPP Clinical Pharmacist (Rheumatology and Pulmonology)

## 2023-05-01 NOTE — Telephone Encounter (Addendum)
error 

## 2023-05-07 ENCOUNTER — Ambulatory Visit (INDEPENDENT_AMBULATORY_CARE_PROVIDER_SITE_OTHER): Payer: Medicare Other | Admitting: Nurse Practitioner

## 2023-05-07 ENCOUNTER — Encounter: Payer: Self-pay | Admitting: Nurse Practitioner

## 2023-05-07 VITALS — BP 132/85 | HR 77 | Temp 97.3°F | Resp 20 | Ht 66.0 in | Wt 183.0 lb

## 2023-05-07 DIAGNOSIS — Z23 Encounter for immunization: Secondary | ICD-10-CM

## 2023-05-07 DIAGNOSIS — Z01818 Encounter for other preprocedural examination: Secondary | ICD-10-CM

## 2023-05-07 NOTE — Progress Notes (Signed)
Subjective:    Patient ID: Daniel Griffin, male    DOB: 08-20-1961, 62 y.o.   MRN: [id number]   Chief Complaint: surgical clearance  HPI  Patient is scheduled for a right total knee replacement next moday. Is here for surgical clearance. 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  Eyes:  Negative for pain.  Respiratory:  Negative for shortness of breath.   Cardiovascular:  Negative for chest pain, palpitations and leg swelling.  Gastrointestinal:  Negative for abdominal pain.  Endocrine: Negative for polydipsia.  Skin:  Negative for rash.  Neurological:  Negative for dizziness, weakness and headaches.  Hematological:  Does not bruise/bleed easily.  All other systems reviewed and are negative.      Objective:   Physical Exam Vitals and nursing note reviewed.  Constitutional:      Appearance: Normal appearance. He is well-developed.  Neck:     Thyroid: No thyroid mass or thyromegaly.     Vascular: No carotid bruit or JVD.     Trachea: Phonation normal.  Cardiovascular:     Rate and Rhythm: Normal rate and regular rhythm.  Pulmonary:     Effort: Pulmonary effort is normal. No respiratory distress.     Breath sounds: Normal breath sounds.  Abdominal:     General: Bowel sounds are normal.     Palpations: Abdomen is soft.     Tenderness: There is no abdominal tenderness.  Musculoskeletal:        General: Normal range of motion.     Cervical back: Normal range of motion and neck supple.  Lymphadenopathy:     Cervical: No cervical adenopathy.   Skin:    General: Skin is warm and dry.  Neurological:     Mental Status: He is alert and oriented to person, place, and time.  Psychiatric:        Behavior: Behavior normal.        Thought Content: Thought content normal.        Judgment: Judgment normal.    BP 132/85   Pulse 77   Temp (!) 97.3 F (36.3 C) (Temporal)   Resp 20   Ht 5\' 6"  (1.676 m)   Wt 183 lb (83 kg)   SpO2 97%   BMI 29.54 kg/m   EKG- Brendolyn Patty, FNP        Assessment & Plan:   Daniel Griffin in today with chief complaint of Pre-op Exam   1. Preop examination Patient cleared medically and cardiac for Total knee  replacement on Monday 05/14/23. - EKG 12-Lead    The above assessment and management plan was discussed with the patient. The patient verbalized understanding of and has agreed to the management plan. Patient is aware to call the clinic if symptoms persist or worsen. Patient is aware when to return to the clinic for a follow-up visit. Patient educated on when it is appropriate to go to the emergency department.   Mary-Margaret Daphine Deutscher, FNP

## 2023-05-08 NOTE — Telephone Encounter (Signed)
Called [employer] to check on the status of the PAP application. Was informed that the application was denied. They had faxed this to the clinic (not received) and sent a letter to the patient. Rep recommended that we contact the patient to encourage to him to send in a social security statement with a statement assuring that is his only source of income, letter of financial hardship, and out of pocket medication cost statement.   Called Bernie's sister (Roxy) to inform her of this. She states Cyndra Numbers does not submit federal taxes and does not have any monthly income outside of social security. She was given the fax number for the office and will fax the documents soon. We will follow up with her and Cyndra Numbers in 2-3 weeks and work on completing the financial hardship letter.   Sofie Rower, PharmD [employer] PGY1

## 2023-05-08 NOTE — Telephone Encounter (Signed)
Financial hardship letter printed  Chesley Mires, PharmD, MPH, BCPS, CPP Clinical Pharmacist (Rheumatology and Pulmonology)

## 2023-05-09 ENCOUNTER — Ambulatory Visit: Payer: Medicare Other | Admitting: Physician Assistant

## 2023-05-14 DIAGNOSIS — M1711 Unilateral primary osteoarthritis, right knee: Secondary | ICD-10-CM | POA: Diagnosis not present

## 2023-05-14 DIAGNOSIS — G8918 Other acute postprocedural pain: Secondary | ICD-10-CM | POA: Diagnosis not present

## 2023-05-14 NOTE — Telephone Encounter (Signed)
Received pharmacy OOP cost printout.. Attached to hardship letter. Submission pending household income proof  Chesley Mires, PharmD, MPH, BCPS, CPP Clinical Pharmacist (Rheumatology and Pulmonology)

## 2023-05-16 ENCOUNTER — Encounter: Payer: Self-pay | Admitting: Physical Therapy

## 2023-05-16 ENCOUNTER — Other Ambulatory Visit: Payer: Self-pay

## 2023-05-16 ENCOUNTER — Ambulatory Visit: Payer: Medicare Other | Attending: Orthopedic Surgery | Admitting: Physical Therapy

## 2023-05-16 DIAGNOSIS — M25661 Stiffness of right knee, not elsewhere classified: Secondary | ICD-10-CM | POA: Insufficient documentation

## 2023-05-16 DIAGNOSIS — M6281 Muscle weakness (generalized): Secondary | ICD-10-CM | POA: Insufficient documentation

## 2023-05-16 DIAGNOSIS — M25561 Pain in right knee: Secondary | ICD-10-CM | POA: Diagnosis not present

## 2023-05-16 DIAGNOSIS — G8929 Other chronic pain: Secondary | ICD-10-CM | POA: Insufficient documentation

## 2023-05-16 NOTE — Therapy (Addendum)
OUTPATIENT PHYSICAL THERAPY LOWER EXTREMITY EVALUATION   Patient Name: MIGUELITO SCHNAIBLE MRN: [id number] DOB:03-08-1961, 62 y.o., male Today's Date: 05/16/2023  END OF SESSION:  PT End of Session - 05/16/23 1231     Visit Number 1    Number of Visits 12    Date for PT Re-Evaluation 06/27/23    Authorization Type FOTO.    PT Start Time 1103    PT Stop Time 1158    PT Time Calculation (min) 55 min    Activity Tolerance Patient tolerated treatment well    Behavior During Therapy WFL for tasks assessed/performed             Past Medical History:  Diagnosis Date   Arthritis    RA   Depression    Seizures (HCC) 06/21/2020   ETOH w/d related   Past Surgical History:  Procedure Laterality Date   ANKLE FRACTURE SURGERY     Left   APPENDECTOMY     IR 3D INDEPENDENT WKST  09/01/2021   IR 3D INDEPENDENT WKST  12/14/2021   IR ANGIO INTRA EXTRACRAN SEL INTERNAL CAROTID BILAT MOD SED  09/01/2021   IR ANGIO INTRA EXTRACRAN SEL INTERNAL CAROTID UNI R MOD SED  12/14/2021   IR ANGIO VERTEBRAL SEL VERTEBRAL BILAT MOD SED  09/01/2021   IR ANGIOGRAM FOLLOW UP STUDY  12/14/2021   IR ANGIOGRAM FOLLOW UP STUDY  12/14/2021   IR ANGIOGRAM FOLLOW UP STUDY  12/14/2021   IR CT HEAD LTD  12/14/2021   IR TRANSCATH/EMBOLIZ  12/14/2021   IR US GUIDE VASC ACCESS RIGHT  09/01/2021   IR US GUIDE VASC ACCESS RIGHT  12/14/2021   RADIOLOGY WITH ANESTHESIA N/A 12/14/2021   Procedure: IR WITH ANESTHESIA EMBOLIZATION;  Surgeon: Baldemar Lenis, MD;  Location: [facility] OR;  Service: Radiology;  Laterality: N/A;   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   REVERSE SHOULDER ARTHROPLASTY Left 05/13/2021   Procedure: REVERSE SHOULDER ARTHROPLASTY;  Surgeon: Beverely Low, MD;  Location: [facility] ORS;  Service: Orthopedics;  Laterality: Left;  with ISB   REVERSE SHOULDER ARTHROPLASTY Right 10/14/2021   Procedure: REVERSE SHOULDER ARTHROPLASTY;  Surgeon: Beverely Low, MD;  Location: [facility] OR[facility];  Service:  Orthopedics;  Laterality: Right;  with ISB   Patient Active Problem List   Diagnosis Date Noted   Body mass index 30.0-30.9, adult 08/03/2022   Brain aneurysm 12/14/2021   H/O total shoulder replacement, 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  THERAPY DIAG:  Chronic pain of right knee  Stiffness of right knee, not elsewhere classified  Muscle weakness (generalized)  Rationale for Evaluation and Treatment: Rehabilitation  ONSET DATE: 05/14/23 (Surgery date).  SUBJECTIVE:   SUBJECTIVE STATEMENT: The patient presents to the clinic 2 days s/p right total knee replacement (performed on 05/14/23).  His post-surgical bulky dressing is intact with ACE wrap. He can remove the bulky dressing and apply the Aquacel tomorrow (he has a visit scheduled with [place]).  He states he has been doing the HEP and using the Zero Knee to improve extension.     PERTINENT HISTORY: RA, bilateral TSA's, left TKA. PAIN:  Are you having pain? Yes: NPRS scale: 7/10 Pain location: Right knee. Pain description: Ache, sore and sharp. Aggravating factors: Movement.  Relieving factors: Pain medication.  PRECAUTIONS: Other: No ultrasound.    WEIGHT BEARING RESTRICTIONS: No  FALLS:  Has patient fallen in last 6 months? No  LIVING ENVIRONMENT: Lives with: lives alone Lives in: House/apartment Stairs:  Ramp. Has following equipment at home: Dan Humphreys - 2 wheeled  OCCUPATION: Retired.  PLOF: Independent  PATIENT GOALS: Not have right knee pain.  Go fishing.  OBJECTIVE:   PATIENT SURVEYS:  FOTO 17.66.  COGNITION: Overall cognitive status:   EDEMA:  Bulky dressing and ACE wrap donned.  PALPATION: C/o diffuse right knee  pain currently.  LOWER EXTREMITY ROM:  In supine, right knee extension is -17 degrees and passive to -10 degrees.  Seated he achieved right knee flexion to 72 degrees.  LOWER EXTREMITY MMT:   Patient is unable to perform an antigravity SLR and SAQ at this time.  GAIT: Step-to gait pattern with a FWW.   TODAY'S TREATMENT:                                                                                                                              DATE: Nustep level 1 x 9 minutes f/b  LE elevation and vasopneumatic on low x 15 minutes.   PATIENT EDUCATION:  Patient has an established HEP from the hospital and is using the Zero Knee to improve extension.  HOME EXERCISE PROGRAM: As above.  ASSESSMENT:  CLINICAL IMPRESSION: The patient presents to OPPT s/p right total knee replacement performed on 05/14/23.  His post-surgical bulky dressing and ACE wrap is intact. An Aquacel can be donned tomorrow (05/17/23).  He has limited flexion and extension currently.  He is compliant with a HEP and is using the Zero Knee to improve his extension.  He is currently unable to perform an antigravity right SLR and SAQ at this time.  He is walking safely with a FWW with a step-to gait pattern. Patient will benefit from skilled physical therapy intervention to address pain and deficits.      OBJECTIVE IMPAIRMENTS: Abnormal gait, decreased activity tolerance, difficulty walking, decreased ROM, decreased strength, increased edema, and pain.   ACTIVITY LIMITATIONS: carrying, lifting, bed mobility, and locomotion level  PARTICIPATION LIMITATIONS: meal prep, cleaning, laundry, and yard work  PERSONAL FACTORS: 1 comorbidity: RA  are also affecting patient's functional outcome.   REHAB POTENTIAL: Excellent  CLINICAL DECISION MAKING: Stable/uncomplicated  EVALUATION COMPLEXITY: Low   GOALS:  SHORT TERM GOALS: Target date: 05/30/23. Ind with an initial HEP. Goal status: INITIAL  2.  Full active right  knee extension. Baseline:  Goal status: INITIAL  3.  Active right knee flexion to 90 degrees+  Goal status: INITIAL    LONG TERM GOALS: Target date: 06/27/23  Ind with an advanced HEP.  Goal status: INITIAL  2.  Active right knee flexion to 115 degrees+ so the patient can perform functional tasks and do so with pain not > 2-3/10.  Goal status: INITIAL  3.  Increase right  hip and knee strength to a solid 4+/5 to provide good stability for accomplishment of functional activities.  Goal status: INITIAL  4.  Perform ADL's with pain not > 2-3/10.  Goal status: INITIAL  5.  Perform a reciprocating stair gait with one railing with pain not > 2-3/10.  Goal status: INITIAL   PLAN:  PT FREQUENCY: 3x/week  PT DURATION: 4 weeks  PLANNED INTERVENTIONS: Therapeutic exercises, Therapeutic activity, Neuromuscular re-education, Gait training, Patient/Family education, Self Care, Electrical stimulation, Cryotherapy, and Manual therapy  PLAN FOR NEXT SESSION: Nustep.  Progress into TKA protocol.  Vasopneumatic with LE elevation.   Benoit Meech, [place], PT 05/16/2023, 12:56 PM

## 2023-05-17 ENCOUNTER — Ambulatory Visit: Payer: Medicare Other

## 2023-05-17 DIAGNOSIS — M6281 Muscle weakness (generalized): Secondary | ICD-10-CM | POA: Diagnosis not present

## 2023-05-17 DIAGNOSIS — G8929 Other chronic pain: Secondary | ICD-10-CM | POA: Diagnosis not present

## 2023-05-17 DIAGNOSIS — M25661 Stiffness of right knee, not elsewhere classified: Secondary | ICD-10-CM | POA: Diagnosis not present

## 2023-05-17 DIAGNOSIS — M25561 Pain in right knee: Secondary | ICD-10-CM | POA: Diagnosis not present

## 2023-05-17 NOTE — Therapy (Signed)
OUTPATIENT PHYSICAL THERAPY LOWER EXTREMITY EVALUATION   Patient Name: Daniel Griffin MRN: [id number] DOB:25-Sep-1960, 62 y.o., male Today's Date: 05/17/2023  END OF SESSION:  PT End of Session - 05/17/23 1348     Visit Number 2    Number of Visits 12    Date for PT Re-Evaluation 06/27/23    Authorization Type FOTO.    PT Start Time 1345    PT Stop Time 1430    PT Time Calculation (min) 45 min    Activity Tolerance Patient tolerated treatment well    Behavior During Therapy WFL for tasks assessed/performed             Past Medical History:  Diagnosis Date   Arthritis    RA   Depression    Seizures (HCC) 06/21/2020   ETOH w/d related   Past Surgical History:  Procedure Laterality Date   ANKLE FRACTURE SURGERY     Left   APPENDECTOMY     IR 3D INDEPENDENT WKST  09/01/2021   IR 3D INDEPENDENT WKST  12/14/2021   IR ANGIO INTRA EXTRACRAN SEL INTERNAL CAROTID BILAT MOD SED  09/01/2021   IR ANGIO INTRA EXTRACRAN SEL INTERNAL CAROTID UNI R MOD SED  12/14/2021   IR ANGIO VERTEBRAL SEL VERTEBRAL BILAT MOD SED  09/01/2021   IR ANGIOGRAM FOLLOW UP STUDY  12/14/2021   IR ANGIOGRAM FOLLOW UP STUDY  12/14/2021   IR ANGIOGRAM FOLLOW UP STUDY  12/14/2021   IR CT HEAD LTD  12/14/2021   IR TRANSCATH/EMBOLIZ  12/14/2021   IR US GUIDE VASC ACCESS RIGHT  09/01/2021   IR US GUIDE VASC ACCESS RIGHT  12/14/2021   RADIOLOGY WITH ANESTHESIA N/A 12/14/2021   Procedure: IR WITH ANESTHESIA EMBOLIZATION;  Surgeon: Baldemar Lenis, MD;  Location[facility] OR;  Service: Radiology;  Laterality: N/A;   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   REVERSE SHOULDER ARTHROPLASTY Left 05/13/2021   Procedure: REVERSE SHOULDER ARTHROPLASTY;  Surgeon: Beverely Low, MD;  Location: [facility];  Service: Orthopedics;  Laterality: Left;  with ISB   REVERSE SHOULDER ARTHROPLASTY Right 10/14/2021   Procedure: REVERSE SHOULDER ARTHROPLASTY;  Surgeon: Beverely Low, MD;  Location: [facility];  Service:  Orthopedics;  Laterality: Right;  with ISB   Patient Active Problem List   Diagnosis Date Noted   Body mass index 30.0-30.9, adult 08/03/2022   Brain aneurysm 12/14/2021   H/O total shoulder replacement, 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  THERAPY DIAG:  Chronic pain of right knee  Stiffness of right knee, not elsewhere classified  Muscle weakness (generalized)  Rationale for Evaluation and Treatment: Rehabilitation  ONSET DATE: 05/14/23 (Surgery date).  SUBJECTIVE:   SUBJECTIVE STATEMENT: Pt reports 6/10 right knee pain today.  Pt needs bandage changed today.    PERTINENT HISTORY: RA, bilateral TSA's, left TKA. PAIN:  Are you having pain? Yes: NPRS scale: 6/10 Pain location: Right knee. Pain description: Ache, sore and sharp. Aggravating factors: Movement. Relieving factors: Pain medication.  PRECAUTIONS: Other: No ultrasound.    WEIGHT BEARING RESTRICTIONS: No  FALLS:  Has patient fallen in last 6 months? No  LIVING ENVIRONMENT: Lives with: lives alone Lives in: House/apartment Stairs:  Ramp. Has following equipment at home: Dan Humphreys - 2 wheeled  OCCUPATION: Retired.  PLOF: Independent  PATIENT GOALS: Not have right knee pain.  Go fishing.  OBJECTIVE:   PATIENT SURVEYS:  FOTO 17.66.  COGNITION: Overall cognitive status:   EDEMA:  Bulky dressing and ACE wrap donned.  PALPATION: C/o diffuse right knee pain currently.  LOWER EXTREMITY ROM:  In supine, right knee extension is -17 degrees and passive to -10 degrees.  Seated he achieved right knee flexion to 72 degrees.  LOWER EXTREMITY MMT:   Patient is unable to perform an antigravity SLR and SAQ at this  time.  GAIT: Step-to gait pattern with a FWW.   TODAY'S TREATMENT:                                                                                                                              DATE:                                    EXERCISE LOG  Exercise Repetitions and Resistance Comments  Nustep Lvl 1 x 15 mins; seat 9-8   SAQ 2 sets of 10    QS    HS    Ankle Pumps 2 mins    Blank cell = exercise not performed today    PATIENT EDUCATION:  Patient has an established HEP from the hospital and is using the Zero Knee to improve extension.  HOME EXERCISE PROGRAM: As above.  ASSESSMENT:  CLINICAL IMPRESSION: Pt arrives for today's treatment session reporting 6/10 right knee pain.  Pt able to tolerate progress on Nustep to seat 8 today with minimal discomfort.  Pt requiring bandage change and donning of compression stocking on right LE.  Rubbing alcohol utilized around perimeter of steri-strips for cleaning.  Aquacel applied to incision without wrinkles and with secure borders.  Pt reported mild increase in pain at completion of today's treatment session.   OBJECTIVE IMPAIRMENTS: Abnormal gait, decreased activity tolerance, difficulty walking, decreased ROM, decreased strength, increased edema, and pain.   ACTIVITY LIMITATIONS: carrying, lifting, bed mobility, and locomotion level  PARTICIPATION LIMITATIONS: meal prep, cleaning, laundry, and yard work  PERSONAL FACTORS: 1 comorbidity: RA  are also affecting patient's functional outcome.   REHAB POTENTIAL: Excellent  CLINICAL DECISION MAKING: Stable/uncomplicated  EVALUATION COMPLEXITY: Low   GOALS:  SHORT TERM GOALS: Target date: 05/30/23. Ind with an initial HEP. Goal status: INITIAL  2.  Full active right knee extension. Baseline:  Goal status: INITIAL  3.  Active right knee flexion to 90 degrees+  Goal status: INITIAL    LONG TERM GOALS: Target date: 06/27/23  Ind with an advanced HEP.  Goal status:  INITIAL  2.  Active right knee flexion to 115 degrees+ so the patient can perform functional tasks and do so with pain not > 2-3/10.  Goal status: INITIAL  3.  Increase right hip and knee strength to a solid 4+/5 to  provide good stability for accomplishment of functional activities.  Goal status: INITIAL  4.  Perform ADL's with pain not > 2-3/10.  Goal status: INITIAL  5.  Perform a reciprocating stair gait with one railing with pain not > 2-3/10.  Goal status: INITIAL   PLAN:  PT FREQUENCY: 3x/week  PT DURATION: 4 weeks  PLANNED INTERVENTIONS: Therapeutic exercises, Therapeutic activity, Neuromuscular re-education, Gait training, Patient/Family education, Self Care, Electrical stimulation, Cryotherapy, and Manual therapy  PLAN FOR NEXT SESSION: Nustep.  Progress into TKA protocol.  Vasopneumatic with LE elevation.   Newman Pies, PTA 05/17/2023, 2:46 PM

## 2023-05-18 ENCOUNTER — Ambulatory Visit: Payer: Medicare Other

## 2023-05-22 ENCOUNTER — Ambulatory Visit: Payer: Medicare Other | Attending: Orthopedic Surgery

## 2023-05-22 DIAGNOSIS — M6281 Muscle weakness (generalized): Secondary | ICD-10-CM | POA: Insufficient documentation

## 2023-05-22 DIAGNOSIS — M25561 Pain in right knee: Secondary | ICD-10-CM | POA: Diagnosis not present

## 2023-05-22 DIAGNOSIS — G8929 Other chronic pain: Secondary | ICD-10-CM | POA: Diagnosis not present

## 2023-05-22 DIAGNOSIS — M25661 Stiffness of right knee, not elsewhere classified: Secondary | ICD-10-CM | POA: Diagnosis not present

## 2023-05-22 NOTE — Therapy (Signed)
OUTPATIENT PHYSICAL THERAPY LOWER EXTREMITY EVALUATION   Patient Name: Daniel Griffin MRN: [id number] DOB:May 26, 1961, 62 y.o., male Today's Date: 05/22/2023  END OF SESSION:  PT End of Session - 05/22/23 0850     Visit Number 3    Number of Visits 12    Date for PT Re-Evaluation 06/27/23    Authorization Type FOTO.    PT Start Time 0845    PT Stop Time 0933    PT Time Calculation (min) 48 min    Activity Tolerance Patient tolerated treatment well    Behavior During Therapy WFL for tasks assessed/performed             Past Medical History:  Diagnosis Date   Arthritis    RA   Depression    Seizures (HCC) 06/21/2020   ETOH w/d related   Past Surgical History:  Procedure Laterality Date   ANKLE FRACTURE SURGERY     Left   APPENDECTOMY     IR 3D INDEPENDENT WKST  09/01/2021   IR 3D INDEPENDENT WKST  12/14/2021   IR ANGIO INTRA EXTRACRAN SEL INTERNAL CAROTID BILAT MOD SED  09/01/2021   IR ANGIO INTRA EXTRACRAN SEL INTERNAL CAROTID UNI R MOD SED  12/14/2021   IR ANGIO VERTEBRAL SEL VERTEBRAL BILAT MOD SED  09/01/2021   IR ANGIOGRAM FOLLOW UP STUDY  12/14/2021   IR ANGIOGRAM FOLLOW UP STUDY  12/14/2021   IR ANGIOGRAM FOLLOW UP STUDY  12/14/2021   IR CT HEAD LTD  12/14/2021   IR TRANSCATH/EMBOLIZ  12/14/2021   IR US GUIDE VASC ACCESS RIGHT  09/01/2021   IR US GUIDE VASC ACCESS RIGHT  12/14/2021   RADIOLOGY WITH ANESTHESIA N/A 12/14/2021   Procedure: IR WITH ANESTHESIA EMBOLIZATION;  Surgeon: Baldemar Lenis, MD;  Location: [facility] OR;  Service: Radiology;  Laterality: N/A;   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   REVERSE SHOULDER ARTHROPLASTY Left 05/13/2021   Procedure: REVERSE SHOULDER ARTHROPLASTY;  Surgeon: Beverely Low, MD;  Location: [facility] ORS;  Service: Orthopedics;  Laterality: Left;  with ISB   REVERSE SHOULDER ARTHROPLASTY Right 10/14/2021   Procedure: REVERSE SHOULDER ARTHROPLASTY;  Surgeon: Beverely Low, MD;  Location: [facility];  Service:  Orthopedics;  Laterality: Right;  with ISB   Patient Active Problem List   Diagnosis Date Noted   Body mass index 30.0-30.9, adult 08/03/2022   Brain aneurysm 12/14/2021   H/O total shoulder replacement, 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  THERAPY DIAG:  Chronic pain of right knee  Stiffness of right knee, not elsewhere classified  Muscle weakness (generalized)  Rationale for Evaluation and Treatment: Rehabilitation  ONSET DATE: 05/14/23 (Surgery date).  SUBJECTIVE:   SUBJECTIVE STATEMENT: Pt reports 5/10 right knee pain today.    PERTINENT HISTORY: RA, bilateral TSA's, left TKA. PAIN:  Are you having pain? Yes: NPRS scale: 5/10 Pain location: Right knee. Pain description: Ache, sore and sharp. Aggravating factors: Movement. Relieving factors: Pain medication.  PRECAUTIONS: Other: No ultrasound.    WEIGHT BEARING RESTRICTIONS: No  FALLS:  Has patient fallen in last 6 months? No  LIVING ENVIRONMENT: Lives with: lives alone Lives in: House/apartment Stairs:  Ramp. Has following equipment at home: Dan Humphreys - 2 wheeled  OCCUPATION: Retired.  PLOF: Independent  PATIENT GOALS: Not have right knee pain.  Go fishing.  OBJECTIVE:   PATIENT SURVEYS:  FOTO 17.66.  COGNITION: Overall cognitive status:   EDEMA:  Bulky dressing and ACE wrap donned.  PALPATION: C/o diffuse right knee pain currently.  LOWER EXTREMITY ROM:  In supine, right knee extension is -17 degrees and passive to -10 degrees.  Seated he achieved right knee flexion to 72 degrees.  LOWER EXTREMITY MMT:   Patient is unable to perform an antigravity SLR and SAQ at this time.  GAIT: Step-to gait pattern  with a FWW.   TODAY'S TREATMENT:                                                                                                                              DATE:                                    EXERCISE LOG  Exercise Repetitions and Resistance Comments  Nustep Lvl 3 x 15 mins; seat 9-8   Rockerboard x3 mins   SAQ x2 mins   QS x2 mins   HS x2 mins   Ankle Pumps x2 mins    Blank cell = exercise not performed today   Modalities  Date:  Vaso: Knee, 34 degrees; low pressure, 15 mins, Pain and Edema   PATIENT EDUCATION:  Patient has an established HEP from the hospital and is using the Zero Knee to improve extension.  HOME EXERCISE PROGRAM: As above.  ASSESSMENT:  CLINICAL IMPRESSION: Pt arrives for today's treatment session reporting 5/10 right knee pain.  Pt able to tolerate introduction to rockerboard today for gastroc stretch and balance.  Pt reports utilizing Zero Knee at home regularly.  Pt with increased discomfort while performing SLR during transfers.  Normal responses to vaso noted upon removal.  Pt reported 3/10 right knee pain at completion of today's treatment session.   OBJECTIVE IMPAIRMENTS: Abnormal gait, decreased activity tolerance, difficulty walking, decreased ROM, decreased strength, increased edema, and pain.   ACTIVITY LIMITATIONS: carrying, lifting, bed mobility, and locomotion level  PARTICIPATION LIMITATIONS: meal prep, cleaning, laundry, and yard work  PERSONAL FACTORS: 1 comorbidity: RA  are also affecting patient's functional outcome.   REHAB POTENTIAL: Excellent  CLINICAL DECISION MAKING: Stable/uncomplicated  EVALUATION COMPLEXITY: Low   GOALS:  SHORT TERM GOALS: Target date: 05/30/23. Ind with an initial HEP. Goal status: INITIAL  2.  Full active right knee extension. Baseline:  Goal status: INITIAL  3.  Active right knee flexion to 90 degrees+  Goal status: INITIAL    LONG TERM GOALS: Target date: 06/27/23  Ind with an  advanced HEP.  Goal status: INITIAL  2.  Active right knee flexion to 115 degrees+ so the patient can perform functional tasks and do so with pain not > 2-3/10.  Goal status: INITIAL  3.  Increase right hip and knee  strength to a solid 4+/5 to provide good stability for accomplishment of functional activities.  Goal status: INITIAL  4.  Perform ADL's with pain not > 2-3/10.  Goal status: INITIAL  5.  Perform a reciprocating stair gait with one railing with pain not > 2-3/10.  Goal status: INITIAL   PLAN:  PT FREQUENCY: 3x/week  PT DURATION: 4 weeks  PLANNED INTERVENTIONS: Therapeutic exercises, Therapeutic activity, Neuromuscular re-education, Gait training, Patient/Family education, Self Care, Electrical stimulation, Cryotherapy, and Manual therapy  PLAN FOR NEXT SESSION: Nustep.  Progress into TKA protocol.  Vasopneumatic with LE elevation.   Newman Pies, PTA 05/22/2023, 9:34 AM

## 2023-05-24 DIAGNOSIS — Z4789 Encounter for other orthopedic aftercare: Secondary | ICD-10-CM | POA: Diagnosis not present

## 2023-05-25 ENCOUNTER — Ambulatory Visit: Payer: Medicare Other

## 2023-05-25 ENCOUNTER — Encounter: Payer: Self-pay | Admitting: Nurse Practitioner

## 2023-05-25 ENCOUNTER — Ambulatory Visit (INDEPENDENT_AMBULATORY_CARE_PROVIDER_SITE_OTHER): Payer: Medicare Other | Admitting: Nurse Practitioner

## 2023-05-25 VITALS — BP 124/64 | HR 75 | Temp 97.3°F | Resp 20 | Ht 66.0 in | Wt 187.0 lb

## 2023-05-25 DIAGNOSIS — M6281 Muscle weakness (generalized): Secondary | ICD-10-CM | POA: Diagnosis not present

## 2023-05-25 DIAGNOSIS — Z0000 Encounter for general adult medical examination without abnormal findings: Secondary | ICD-10-CM | POA: Diagnosis not present

## 2023-05-25 DIAGNOSIS — M25661 Stiffness of right knee, not elsewhere classified: Secondary | ICD-10-CM | POA: Diagnosis not present

## 2023-05-25 DIAGNOSIS — G8929 Other chronic pain: Secondary | ICD-10-CM | POA: Diagnosis not present

## 2023-05-25 DIAGNOSIS — M25561 Pain in right knee: Secondary | ICD-10-CM | POA: Diagnosis not present

## 2023-05-25 DIAGNOSIS — Z23 Encounter for immunization: Secondary | ICD-10-CM

## 2023-05-25 NOTE — Addendum Note (Signed)
Addended by: Cleda Daub on: 05/25/2023 04:42 PM   Modules accepted: Orders

## 2023-05-25 NOTE — Therapy (Signed)
OUTPATIENT PHYSICAL THERAPY LOWER EXTREMITY EVALUATION   Patient Name: Daniel Griffin MRN: [id number] DOB:[birth year], 62 y.o., male Today's Date: 05/25/2023  END OF SESSION:  PT End of Session - 05/25/23 1020     Visit Number 4    Number of Visits 12    Date for PT Re-Evaluation 06/27/23    Authorization Type FOTO.    PT Start Time 1015    PT Stop Time 1108    PT Time Calculation (min) 53 min    Activity Tolerance Patient tolerated treatment well    Behavior During Therapy WFL for tasks assessed/performed             Past Medical History:  Diagnosis Date   Arthritis    RA   Depression    Seizures (HCC) 06/21/2020   ETOH w/d related   Past Surgical History:  Procedure Laterality Date   ANKLE FRACTURE SURGERY     Left   APPENDECTOMY     IR 3D INDEPENDENT WKST  09/01/2021   IR 3D INDEPENDENT WKST  12/14/2021   IR ANGIO INTRA EXTRACRAN SEL INTERNAL CAROTID BILAT MOD SED  09/01/2021   IR ANGIO INTRA EXTRACRAN SEL INTERNAL CAROTID UNI R MOD SED  12/14/2021   IR ANGIO VERTEBRAL SEL VERTEBRAL BILAT MOD SED  09/01/2021   IR ANGIOGRAM FOLLOW UP STUDY  12/14/2021   IR ANGIOGRAM FOLLOW UP STUDY  12/14/2021   IR ANGIOGRAM FOLLOW UP STUDY  12/14/2021   IR CT HEAD LTD  12/14/2021   IR TRANSCATH/EMBOLIZ  12/14/2021   IR US GUIDE VASC ACCESS RIGHT  09/01/2021   IR US GUIDE VASC ACCESS RIGHT  12/14/2021   RADIOLOGY WITH ANESTHESIA N/A 12/14/2021   Procedure: IR WITH ANESTHESIA EMBOLIZATION;  Surgeon: Baldemar Lenis, MD;  Location: [facility] OR;  Service: Radiology;  Laterality: N/A;   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   REVERSE SHOULDER ARTHROPLASTY Left 05/13/2021   Procedure: REVERSE SHOULDER ARTHROPLASTY;  Surgeon: Beverely Low, MD;  Location: [facility];  Service: Orthopedics;  Laterality: Left;  with ISB   REVERSE SHOULDER ARTHROPLASTY Right 10/14/2021   Procedure: REVERSE SHOULDER ARTHROPLASTY;  Surgeon: Beverely Low, MD;  Location: [facility];  Service:  Orthopedics;  Laterality: Right;  with ISB   Patient Active Problem List   Diagnosis Date Noted   Body mass index 30.0-30.9, adult 08/03/2022   Brain aneurysm 12/14/2021   H/O total shoulder replacement, 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  THERAPY DIAG:  Chronic pain of right knee  Stiffness of right knee, not elsewhere classified  Muscle weakness (generalized)  Rationale for Evaluation and Treatment: Rehabilitation  ONSET DATE: 05/14/23 (Surgery date).  SUBJECTIVE:   SUBJECTIVE STATEMENT: Pt reports 6/10 right knee pain today.  Pt reports MD removed his aquacel and told him to monitor mild redness.  PERTINENT HISTORY: RA, bilateral TSA's, left TKA. PAIN:  Are you having pain? Yes: NPRS scale: 6/10 Pain location: Right knee. Pain description: Ache, sore and sharp. Aggravating factors: Movement. Relieving factors: Pain medication.  PRECAUTIONS: Other: No ultrasound.    WEIGHT BEARING RESTRICTIONS: No  FALLS:  Has patient fallen in last 6 months? No  LIVING ENVIRONMENT: Lives with: lives alone Lives in: House/apartment Stairs:  Ramp. Has following equipment  at home: Dan Humphreys - 2 wheeled  OCCUPATION: Retired.  PLOF: Independent  PATIENT GOALS: Not have right knee pain.  Go fishing.  OBJECTIVE:   PATIENT SURVEYS:  FOTO 17.66.  COGNITION: Overall cognitive status:   EDEMA:  Bulky dressing and ACE wrap donned.  PALPATION: C/o diffuse right knee pain currently.  LOWER EXTREMITY ROM:  In supine, right knee extension is -17 degrees and passive to -10 degrees.  Seated he achieved right knee flexion to 72 degrees.  LOWER EXTREMITY MMT:   Patient is unable to perform an  antigravity SLR and SAQ at this time.  GAIT: Step-to gait pattern with a FWW.   TODAY'S TREATMENT:                                                                                                                              DATE:                                    EXERCISE LOG  Exercise Repetitions and Resistance Comments  Nustep Lvl 3 x 15 mins; seat 9-8   Rockerboard x4 mins   SAQ x3 mins   QS x2 mins   Heel slides x2 mins   Ankle Pumps x3 mins    Blank cell = exercise not performed today   Modalities  Date:  Vaso: Knee, 34 degrees; low pressure, 15 mins, Pain and Edema   PATIENT EDUCATION:  Patient has an established HEP from the hospital and is using the Zero Knee to improve extension.  HOME EXERCISE PROGRAM: As above.  ASSESSMENT:  CLINICAL IMPRESSION: Pt arrives for today's treatment session reporting 6/10 right knee pain.  Pt able to tolerate increased time with various exercises today with minimal pain.  Pt able to demonstrate 105 degrees of active right knee flexion today. Normal responses to vaso noted upon removal.  Pt reported 3/10 right knee pain upon completion of today's treatment session.   OBJECTIVE IMPAIRMENTS: Abnormal gait, decreased activity tolerance, difficulty walking, decreased ROM, decreased strength, increased edema, and pain.   ACTIVITY LIMITATIONS: carrying, lifting, bed mobility, and locomotion level  PARTICIPATION LIMITATIONS: meal prep, cleaning, laundry, and yard work  PERSONAL FACTORS: 1 comorbidity: RA  are also affecting patient's functional outcome.   REHAB POTENTIAL: Excellent  CLINICAL DECISION MAKING: Stable/uncomplicated  EVALUATION COMPLEXITY: Low   GOALS:  SHORT TERM GOALS: Target date: 05/30/23. Ind with an initial HEP. Goal status: MET  2.  Full active right knee extension. Baseline:  Goal status: INITIAL  3.  Active right knee flexion to 90 degrees+  Goal status: MET    LONG TERM GOALS: Target date:  06/27/23  Ind with an advanced HEP.  Goal status: IN PROGRESS  2.  Active right knee flexion to 115 degrees+ so the patient can perform functional tasks and do so with pain not > 2-3/10.  Goal status: IN  PROGRESS  3.  Increase right hip and knee strength to a solid 4+/5 to provide good stability for accomplishment of functional activities.  Goal status: IN PROGRESS  4.  Perform ADL's with pain not > 2-3/10.  Goal status: IN PROGRESS  5.  Perform a reciprocating stair gait with one railing with pain not > 2-3/10.  Goal status: IN PROGRESS   PLAN:  PT FREQUENCY: 3x/week  PT DURATION: 4 weeks  PLANNED INTERVENTIONS: Therapeutic exercises, Therapeutic activity, Neuromuscular re-education, Gait training, Patient/Family education, Self Care, Electrical stimulation, Cryotherapy, and Manual therapy  PLAN FOR NEXT SESSION: Nustep.  Progress into TKA protocol.  Vasopneumatic with LE elevation.   Newman Pies, PTA 05/25/2023, 11:08 AM

## 2023-05-25 NOTE — Progress Notes (Signed)
Subjective:    Daniel Griffin is a 62 y.o. male who presents for a Welcome to Medicare exam.   Cardiac Risk Factors include: hypertension;sedentary lifestyle     Objective:    Today's Vitals   05/25/23 0934 05/25/23 0935  BP: 124/64   Pulse: 75   Resp: 20   Temp: (!) 97.3 F (36.3 C)   TempSrc: Temporal   SpO2: 90%   Weight: 187 lb (84.8 kg)   Height: 5\' 6"  (1.676 m)   PainSc:  5    Body mass index is 30.18 kg/m.  Medications Outpatient Encounter Medications as of 05/25/2023  Medication Sig   buPROPion (WELLBUTRIN XL) 150 MG 24 hr tablet TAKE 1 TABLET BY MOUTH EVERY DAY   escitalopram (LEXAPRO) 10 MG tablet Take 1 tablet (10 mg total) by mouth daily.   lisinopril (ZESTRIL) 20 MG tablet Take 1 tablet (20 mg total) by mouth daily.   Abatacept (ORENCIA CLICKJECT) 125 MG/ML SOAJ INJECT 125 MG INTO THE SKIN ONCE A WEEK. (Patient not taking: Reported on 05/25/2023)   No facility-administered encounter medications on file as of 05/25/2023.     History: Past Medical History:  Diagnosis Date   Arthritis    RA   Depression    Seizures (HCC) 06/21/2020   ETOH w/d related   Past Surgical History:  Procedure Laterality Date   ANKLE FRACTURE SURGERY     Left   APPENDECTOMY     IR 3D INDEPENDENT WKST  09/01/2021   IR 3D INDEPENDENT WKST  12/14/2021   IR ANGIO INTRA EXTRACRAN SEL INTERNAL CAROTID BILAT MOD SED  09/01/2021   IR ANGIO INTRA EXTRACRAN SEL INTERNAL CAROTID UNI R MOD SED  12/14/2021   IR ANGIO VERTEBRAL SEL VERTEBRAL BILAT MOD SED  09/01/2021   IR ANGIOGRAM FOLLOW UP STUDY  12/14/2021   IR ANGIOGRAM FOLLOW UP STUDY  12/14/2021   IR ANGIOGRAM FOLLOW UP STUDY  12/14/2021   IR CT HEAD LTD  12/14/2021   IR TRANSCATH/EMBOLIZ  12/14/2021   IR US GUIDE VASC ACCESS RIGHT  09/01/2021   IR US GUIDE VASC ACCESS RIGHT  12/14/2021   RADIOLOGY WITH ANESTHESIA N/A 12/14/2021   Procedure: IR WITH ANESTHESIA EMBOLIZATION;  Surgeon: Baldemar Lenis, MD;   Location: [facility] OR;  Service: Radiology;  Laterality: N/A;   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   REVERSE SHOULDER ARTHROPLASTY Left 05/13/2021   Procedure: REVERSE SHOULDER ARTHROPLASTY;  Surgeon: Beverely Low, MD;  Location: [facility];  Service: Orthopedics;  Laterality: Left;  with ISB   REVERSE SHOULDER ARTHROPLASTY Right 10/14/2021   Procedure: REVERSE SHOULDER ARTHROPLASTY;  Surgeon: Beverely Low, MD;  Location: [facility];  Service: Orthopedics;  Laterality: Right;  with ISB    Family History  Problem Relation Age of Onset   Alzheimer's disease Father    Healthy Son    Healthy Daughter    Rheum arthritis Daughter    Healthy Daughter    Social History   Occupational History   Occupation: not worked since seizure 06/21/20  Tobacco Use   Smoking status: Never    Passive exposure: Never   Smokeless tobacco: Current    Types: Snuff, Chew  Vaping Use   Vaping status: Never Used  Substance and Sexual Activity   Alcohol use: Yes    Comment: occ   Drug use: Yes    Types: Marijuana    Comment: occ   Sexual activity: Not on file    Tobacco Counseling Does  not smoke   Immunizations and Health Maintenance Immunization History  Administered Date(s) Administered   Influenza, Seasonal, Injecte, Preservative Fre 05/07/2023   Influenza,inj,Quad PF,6+ Mos 07/04/2018, 08/03/2022   Health Maintenance Due  Topic Date Due   Medicare Annual Wellness (AWV)  Never done   COVID-19 Vaccine (1) Never done   DTaP/Tdap/Td (1 - Tdap) Never done    Activities of Daily Living    05/25/2023    9:37 AM  In your present state of health, do you have any difficulty performing the following activities:  Hearing? 0  Vision? 0  Difficulty concentrating or making decisions? 0  Walking or climbing stairs? 0  Dressing or bathing? 0  Doing errands, shopping? 0  Preparing Food and eating ? N  Using the Toilet? N  In the past six months, have you accidently leaked urine? N  Do you have problems with  loss of bowel control? N  Managing your Medications? N  Managing your Finances? N  Housekeeping or managing your Housekeeping? N    Physical Exam   Physical Exam (optional), or other factors deemed appropriate based on the beneficiary's medical and social history and current clinical standards.   Advanced Directives: Does Patient Have a Medical Advance Directive?: No Would patient like information on creating a medical advance directive?: No - Patient declined      Assessment:    This is a routine wellness  examination for this patient . Welcome to medicare  Vision/Hearing screen Needs to schedule eye exam   Goals      DIET - EAT MORE FRUITS AND VEGETABLES         Depression Screen    05/07/2023   12:26 PM 04/02/2023    8:17 AM 02/02/2023   10:28 AM 10/24/2022    2:02 PM  PHQ 2/9 Scores  PHQ - 2 Score 0  0 0  PHQ- 9 Score 0  0 0  Exception Documentation  Patient refusal       Fall Risk    05/25/2023    9:44 AM  Fall Risk   Falls in the past year? 0    Cognitive Function    10/30/2022   10:30 AM 10/27/2021   10:31 AM 04/21/2021   11:13 AM 01/06/2021    3:41 PM  MMSE - Mini Mental State Exam  Orientation to time 5 4 3 3   Orientation to Place 5 4 2 5   Registration 3 3 2 3   Attention/ Calculation 3 3 1  0  Recall 2 2 2 2   Language- name 2 objects 2 2 2 2   Language- repeat 0 0 0 0  Language- follow 3 step command 3 3 2 3   Language- read & follow direction 1 0 0 0  Language-read & follow direction-comments  unable to read    Write a sentence 0 0 0 0  Write a sentence-comments pt cant wright unable to write    Copy design 0 0 0 1  Copy design-comments  unable to write    Total score 24 21 14 19         05/25/2023    9:44 AM  6CIT Screen  What Year? 4 points  What month? 3 points  What time? 0 points  Count back from 20 0 points  Months in reverse 2 points  Repeat phrase 2 points  Total Score 11 points    Patient Care Team: Bennie Pierini, FNP  as PCP - General (Family Medicine) Maisie Fus, MD  as PCP - Cardiology (Cardiology)     Plan:   Welcome to medicare  I have personally reviewed and noted the following in the patient's chart:   Medical and social history Use of alcohol, tobacco or illicit drugs  Current medications and supplements Functional ability and status Nutritional status Physical activity Advanced directives List of other physicians Hospitalizations, surgeries, and ER visits in previous 12 months Vitals Screenings to include cognitive, depression, and falls Referrals and appointments  In addition, I have reviewed and discussed with patient certain preventive protocols, quality metrics, and best practice recommendations. A written personalized care plan for preventive services as well as general preventive health recommendations were provided to patient.     Mary-Margaret Daphine Deutscher, FNP 05/25/2023

## 2023-05-28 ENCOUNTER — Other Ambulatory Visit: Payer: Self-pay | Admitting: Nurse Practitioner

## 2023-05-28 DIAGNOSIS — C44329 Squamous cell carcinoma of skin of other parts of face: Secondary | ICD-10-CM | POA: Diagnosis not present

## 2023-05-28 DIAGNOSIS — F322 Major depressive disorder, single episode, severe without psychotic features: Secondary | ICD-10-CM

## 2023-05-28 DIAGNOSIS — D485 Neoplasm of uncertain behavior of skin: Secondary | ICD-10-CM | POA: Diagnosis not present

## 2023-05-28 DIAGNOSIS — D225 Melanocytic nevi of trunk: Secondary | ICD-10-CM | POA: Diagnosis not present

## 2023-05-29 ENCOUNTER — Other Ambulatory Visit: Payer: Self-pay | Admitting: Nurse Practitioner

## 2023-05-29 ENCOUNTER — Ambulatory Visit: Payer: Medicare Other

## 2023-05-29 DIAGNOSIS — G8929 Other chronic pain: Secondary | ICD-10-CM

## 2023-05-29 DIAGNOSIS — M25661 Stiffness of right knee, not elsewhere classified: Secondary | ICD-10-CM

## 2023-05-29 DIAGNOSIS — M6281 Muscle weakness (generalized): Secondary | ICD-10-CM

## 2023-05-29 DIAGNOSIS — F322 Major depressive disorder, single episode, severe without psychotic features: Secondary | ICD-10-CM

## 2023-05-29 DIAGNOSIS — M25561 Pain in right knee: Secondary | ICD-10-CM | POA: Diagnosis not present

## 2023-05-29 NOTE — Therapy (Signed)
OUTPATIENT PHYSICAL THERAPY LOWER EXTREMITY EVALUATION   Patient Name: Daniel Griffin MRN: [id number] DOB:1961/01/18, 62 y.o., male Today's Date: 05/29/2023  END OF SESSION:  PT End of Session - 05/29/23 0848     Visit Number 5    Number of Visits 12    Date for PT Re-Evaluation 06/27/23    Authorization Type FOTO.    PT Start Time 0845    PT Stop Time 0941    PT Time Calculation (min) 56 min    Activity Tolerance Patient tolerated treatment well    Behavior During Therapy WFL for tasks assessed/performed             Past Medical History:  Diagnosis Date   Arthritis    RA   Depression    Seizures (HCC) 06/21/2020   ETOH w/d related   Past Surgical History:  Procedure Laterality Date   ANKLE FRACTURE SURGERY     Left   APPENDECTOMY     IR 3D INDEPENDENT WKST  09/01/2021   IR 3D INDEPENDENT WKST  12/14/2021   IR ANGIO INTRA EXTRACRAN SEL INTERNAL CAROTID BILAT MOD SED  09/01/2021   IR ANGIO INTRA EXTRACRAN SEL INTERNAL CAROTID UNI R MOD SED  12/14/2021   IR ANGIO VERTEBRAL SEL VERTEBRAL BILAT MOD SED  09/01/2021   IR ANGIOGRAM FOLLOW UP STUDY  12/14/2021   IR ANGIOGRAM FOLLOW UP STUDY  12/14/2021   IR ANGIOGRAM FOLLOW UP STUDY  12/14/2021   IR CT HEAD LTD  12/14/2021   IR TRANSCATH/EMBOLIZ  12/14/2021   IR US GUIDE VASC ACCESS RIGHT  09/01/2021   IR US GUIDE VASC ACCESS RIGHT  12/14/2021   RADIOLOGY WITH ANESTHESIA N/A 12/14/2021   Procedure: IR WITH ANESTHESIA EMBOLIZATION;  Surgeon: Baldemar Lenis, MD;  Location: [facility] OR;  Service: Radiology;  Laterality: N/A;   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   REVERSE SHOULDER ARTHROPLASTY Left 05/13/2021   Procedure: REVERSE SHOULDER ARTHROPLASTY;  Surgeon: Beverely Low, MD;  Location: [facility] ORS;  Service: Orthopedics;  Laterality: Left;  with ISB   REVERSE SHOULDER ARTHROPLASTY Right 10/14/2021   Procedure: REVERSE SHOULDER ARTHROPLASTY;  Surgeon: Beverely Low, MD;  Location: [facility];  Service:  Orthopedics;  Laterality: Right;  with ISB   Patient Active Problem List   Diagnosis Date Noted   Body mass index 30.0-30.9, adult 08/03/2022   Brain aneurysm 12/14/2021   H/O total shoulder replacement, 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  THERAPY DIAG:  Chronic pain of right knee  Stiffness of right knee, not elsewhere classified  Muscle weakness (generalized)  Rationale for Evaluation and Treatment: Rehabilitation  ONSET DATE: 05/14/23 (Surgery date).  SUBJECTIVE:   SUBJECTIVE STATEMENT: Pt reports 4/10 right knee pain today.    PERTINENT HISTORY: RA, bilateral TSA's, left TKA. PAIN:  Are you having pain? Yes: NPRS scale: 4/10 Pain location: Right knee. Pain description: Ache, sore and sharp. Aggravating factors: Movement. Relieving factors: Pain medication.  PRECAUTIONS: Other: No ultrasound.    WEIGHT BEARING RESTRICTIONS: No  FALLS:  Has patient fallen in last 6 months? No  LIVING ENVIRONMENT: Lives with: lives alone Lives in: House/apartment Stairs:  Ramp. Has following equipment at home: Dan Humphreys - 2 wheeled  OCCUPATION: Retired.  PLOF: Independent  PATIENT GOALS: Not have right knee pain.  Go fishing.  OBJECTIVE:   PATIENT SURVEYS:  FOTO 17.66.  COGNITION: Overall cognitive status:   EDEMA:  Bulky dressing and ACE wrap donned.  PALPATION: C/o diffuse right knee pain currently.  LOWER EXTREMITY ROM:  In supine, right knee extension is -17 degrees and passive to -10 degrees.  Seated he achieved right knee flexion to 72 degrees.  LOWER EXTREMITY MMT:   Patient is unable to perform an antigravity SLR and SAQ at this time.  GAIT: Step-to gait pattern  with a FWW.   TODAY'S TREATMENT:                                                                                                                              DATE: 05/29/23                                   EXERCISE LOG  Exercise Repetitions and Resistance Comments  Nustep Lvl 3 x 15 mins; seat 9-8   Rockerboard x5 mins   Lunges 8" box x 3 mins   LAQ AROM x 20 reps   Seated Marches AROM x 20 reps   SAQ 3# x3 mins   QS    Heel slides         Blank cell = exercise not performed today   Modalities  Date:  Vaso: Knee, 34 degrees; low pressure, 15 mins, Pain and Edema   PATIENT EDUCATION:  Patient has an established HEP from the hospital and is using the Zero Knee to improve extension.  HOME EXERCISE PROGRAM: As above.  ASSESSMENT:  CLINICAL IMPRESSION: Pt arrives for today's treatment session reporting 4/10 right knee pain.  Pt able to increased FOTO score to 56 today.  Pt able to demonstrate -5 degrees of right knee extension.  Pt encouraged to continue use of Zero Knee at home.  Pt introduced to several standing and seated exercises with min cues required for proper technique and posture.  Normal responses to vaso noted upon removal.  Pt reported 2/10 right knee pain at completion of today's treatment session.   OBJECTIVE IMPAIRMENTS: Abnormal gait, decreased activity tolerance, difficulty walking, decreased ROM, decreased strength, increased edema, and pain.   ACTIVITY LIMITATIONS: carrying, lifting, bed mobility, and locomotion level  PARTICIPATION LIMITATIONS: meal prep, cleaning, laundry, and yard work  PERSONAL FACTORS: 1 comorbidity: RA  are also affecting patient's functional outcome.   REHAB POTENTIAL: Excellent  CLINICAL DECISION MAKING: Stable/uncomplicated  EVALUATION COMPLEXITY: Low   GOALS:  SHORT TERM GOALS: Target date: 05/30/23. Ind with an initial HEP. Goal status: MET  2.  Full active right knee extension. Baseline: 10/8: -5 degrees Goal status:  IN PROGRESS  3.  Active right knee flexion to 90 degrees+  Goal status: MET    LONG TERM GOALS: Target date: 06/27/23  Ind with an advanced HEP.  Goal status: IN PROGRESS  2.  Active right knee flexion to 115 degrees+ so the patient can perform functional tasks and do so with pain not > 2-3/10.  Goal status: IN PROGRESS  3.  Increase right hip and knee strength to a solid 4+/5 to provide good stability for accomplishment of functional activities.  Goal status: IN PROGRESS  4.  Perform ADL's with pain not > 2-3/10.  Goal status: IN PROGRESS  5.  Perform a reciprocating stair gait with one railing with pain not > 2-3/10.  Goal status: IN PROGRESS   PLAN:  PT FREQUENCY: 3x/week  PT DURATION: 4 weeks  PLANNED INTERVENTIONS: Therapeutic exercises, Therapeutic activity, Neuromuscular re-education, Gait training, Patient/Family education, Self Care, Electrical stimulation, Cryotherapy, and Manual therapy  PLAN FOR NEXT SESSION: Nustep.  Progress into TKA protocol.  Vasopneumatic with LE elevation.   Newman Pies, PTA 05/29/2023, 9:41 AM

## 2023-05-31 ENCOUNTER — Ambulatory Visit: Payer: Medicare Other

## 2023-05-31 DIAGNOSIS — M25561 Pain in right knee: Secondary | ICD-10-CM | POA: Diagnosis not present

## 2023-05-31 DIAGNOSIS — G8929 Other chronic pain: Secondary | ICD-10-CM | POA: Diagnosis not present

## 2023-05-31 DIAGNOSIS — M25661 Stiffness of right knee, not elsewhere classified: Secondary | ICD-10-CM

## 2023-05-31 DIAGNOSIS — M6281 Muscle weakness (generalized): Secondary | ICD-10-CM | POA: Diagnosis not present

## 2023-05-31 NOTE — Therapy (Signed)
OUTPATIENT PHYSICAL THERAPY LOWER EXTREMITY EVALUATION   Patient Name: Daniel Griffin MRN: [id number] DOB:Dec 20, 1960, 62 y.o., male Today's Date: 05/31/2023  END OF SESSION:  PT End of Session - 05/31/23 0846     Visit Number 6    Number of Visits 12    Date for PT Re-Evaluation 06/27/23    Authorization Type FOTO.    PT Start Time 0845    PT Stop Time 0936    PT Time Calculation (min) 51 min    Activity Tolerance Patient tolerated treatment well    Behavior During Therapy WFL for tasks assessed/performed             Past Medical History:  Diagnosis Date   Arthritis    RA   Depression    Seizures (HCC) 06/21/2020   ETOH w/d related   Past Surgical History:  Procedure Laterality Date   ANKLE FRACTURE SURGERY     Left   APPENDECTOMY     IR 3D INDEPENDENT WKST  09/01/2021   IR 3D INDEPENDENT WKST  12/14/2021   IR ANGIO INTRA EXTRACRAN SEL INTERNAL CAROTID BILAT MOD SED  09/01/2021   IR ANGIO INTRA EXTRACRAN SEL INTERNAL CAROTID UNI R MOD SED  12/14/2021   IR ANGIO VERTEBRAL SEL VERTEBRAL BILAT MOD SED  09/01/2021   IR ANGIOGRAM FOLLOW UP STUDY  12/14/2021   IR ANGIOGRAM FOLLOW UP STUDY  12/14/2021   IR ANGIOGRAM FOLLOW UP STUDY  12/14/2021   IR CT HEAD LTD  12/14/2021   IR TRANSCATH/EMBOLIZ  12/14/2021   IR US GUIDE VASC ACCESS RIGHT  09/01/2021   IR US GUIDE VASC ACCESS RIGHT  12/14/2021   RADIOLOGY WITH ANESTHESIA N/A 12/14/2021   Procedure: IR WITH ANESTHESIA EMBOLIZATION;  Surgeon: Baldemar Lenis, MD;  Location: [facility] OR;  Service: Radiology;  Laterality: N/A;   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   REVERSE SHOULDER ARTHROPLASTY Left 05/13/2021   Procedure: REVERSE SHOULDER ARTHROPLASTY;  Surgeon: Beverely Low, MD;  Location: [facility];  Service: Orthopedics;  Laterality: Left;  with ISB   REVERSE SHOULDER ARTHROPLASTY Right 10/14/2021   Procedure: REVERSE SHOULDER ARTHROPLASTY;  Surgeon: Beverely Low, MD;  Location: [facility];  Service:  Orthopedics;  Laterality: Right;  with ISB   Patient Active Problem List   Diagnosis Date Noted   Body mass index 30.0-30.9, adult 08/03/2022   Brain aneurysm 12/14/2021   H/O total shoulder replacement, 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  THERAPY DIAG:  Chronic pain of right knee  Stiffness of right knee, not elsewhere classified  Muscle weakness (generalized)  Rationale for Evaluation and Treatment: Rehabilitation  ONSET DATE: 05/14/23 (Surgery date).  SUBJECTIVE:   SUBJECTIVE STATEMENT: Pt reports 5/10 right knee pain today.   Pt ambulates into session with walking stick versus FWW.   PERTINENT HISTORY: RA, bilateral TSA's, left TKA. PAIN:  Are you having pain? Yes: NPRS scale: 5/10 Pain location: Right knee. Pain description: Ache, sore and sharp. Aggravating factors: Movement. Relieving factors: Pain medication.  PRECAUTIONS: Other: No ultrasound.    WEIGHT BEARING RESTRICTIONS: No  FALLS:  Has patient fallen in last 6 months? No  LIVING ENVIRONMENT: Lives with: lives alone Lives in: House/apartment Stairs:  Ramp. Has following equipment at home:  Walker - 2 wheeled  OCCUPATION: Retired.  PLOF: Independent  PATIENT GOALS: Not have right knee pain.  Go fishing.  OBJECTIVE:   PATIENT SURVEYS:  FOTO 17.66.  COGNITION: Overall cognitive status:   EDEMA:  Bulky dressing and ACE wrap donned.  PALPATION: C/o diffuse right knee pain currently.  LOWER EXTREMITY ROM:  In supine, right knee extension is -17 degrees and passive to -10 degrees.  Seated he achieved right knee flexion to 72 degrees.  LOWER EXTREMITY MMT:   Patient is unable to perform an antigravity  SLR and SAQ at this time.  GAIT: Step-to gait pattern with a FWW.   TODAY'S TREATMENT:                                                                                                                              DATE: 05/31/23                                   EXERCISE LOG  Exercise Repetitions and Resistance Comments  Nustep Lvl 4 x 15 mins; seat 9-8   Rockerboard x5 mins   Lunges 8" box x 3 mins   Forward Step Ups 4" box x 20 reps   LAQ 2# x 20 reps   Seated Marches 2# x 20 reps   Seated Hip Adduction 2.5 mins   Seated Hip Abduction Red 2.5 mins   Heel slides         Blank cell = exercise not performed today   Modalities  Date:  Vaso: Knee, 34 degrees; low pressure, 10 mins, Pain and Edema   PATIENT EDUCATION:  Patient has an established HEP from the hospital and is using the Zero Knee to improve extension.  HOME EXERCISE PROGRAM: As above.  ASSESSMENT:  CLINICAL IMPRESSION: Pt arrives for today's treatment session reporting 5/10 right knee pain.  Pt introduced to forward step up on 4" step with cues required for sequencing.  Pt also introduced to several seated exercises to increase strength and function.  Normal responses to vaso noted upon removal.  Pt reported 3/10 right knee pain at completion of today's treatment session.  OBJECTIVE IMPAIRMENTS: Abnormal gait, decreased activity tolerance, difficulty walking, decreased ROM, decreased strength, increased edema, and pain.   ACTIVITY LIMITATIONS: carrying, lifting, bed mobility, and locomotion level  PARTICIPATION LIMITATIONS: meal prep, cleaning, laundry, and yard work  PERSONAL FACTORS: 1 comorbidity: RA  are also affecting patient's functional outcome.   REHAB POTENTIAL: Excellent  CLINICAL DECISION MAKING: Stable/uncomplicated  EVALUATION COMPLEXITY: Low   GOALS:  SHORT TERM GOALS: Target date: 05/30/23. Ind with an initial HEP. Goal status: MET  2.  Full active right knee extension. Baseline:  10/8: -5 degrees Goal status: IN PROGRESS  3.  Active right knee flexion to 90 degrees+  Goal status: MET    LONG TERM GOALS: Target date: 06/27/23  Ind with  an advanced HEP.  Goal status: IN PROGRESS  2.  Active right knee flexion to 115 degrees+ so the patient can perform functional tasks and do so with pain not > 2-3/10.  Goal status: IN PROGRESS  3.  Increase right hip and knee strength to a solid 4+/5 to provide good stability for accomplishment of functional activities.  Goal status: IN PROGRESS  4.  Perform ADL's with pain not > 2-3/10.  Goal status: IN PROGRESS  5.  Perform a reciprocating stair gait with one railing with pain not > 2-3/10.  Goal status: IN PROGRESS   PLAN:  PT FREQUENCY: 3x/week  PT DURATION: 4 weeks  PLANNED INTERVENTIONS: Therapeutic exercises, Therapeutic activity, Neuromuscular re-education, Gait training, Patient/Family education, Self Care, Electrical stimulation, Cryotherapy, and Manual therapy  PLAN FOR NEXT SESSION: Nustep.  Progress into TKA protocol.  Vasopneumatic with LE elevation.   Newman Pies, PTA 05/31/2023, 9:39 AM

## 2023-06-05 ENCOUNTER — Ambulatory Visit: Payer: Medicare Other

## 2023-06-05 DIAGNOSIS — M25561 Pain in right knee: Secondary | ICD-10-CM | POA: Diagnosis not present

## 2023-06-05 DIAGNOSIS — M25661 Stiffness of right knee, not elsewhere classified: Secondary | ICD-10-CM

## 2023-06-05 DIAGNOSIS — G8929 Other chronic pain: Secondary | ICD-10-CM

## 2023-06-05 DIAGNOSIS — M6281 Muscle weakness (generalized): Secondary | ICD-10-CM

## 2023-06-05 NOTE — Therapy (Signed)
OUTPATIENT PHYSICAL THERAPY LOWER EXTREMITY EVALUATION   Patient Name: Daniel Griffin MRN: [id number] DOB:[birth year], 62 y.o., male Today's Date: 06/05/2023  END OF SESSION:  PT End of Session - 06/05/23 0850     Visit Number 7    Number of Visits 12    Date for PT Re-Evaluation 06/27/23    Authorization Type FOTO.    PT Start Time 0845    PT Stop Time [phone]    PT Time Calculation (min) 58 min    Activity Tolerance Patient tolerated treatment well    Behavior During Therapy WFL for tasks assessed/performed             Past Medical History:  Diagnosis Date   Arthritis    RA   Depression    Seizures (HCC) 06/21/2020   ETOH w/d related   Past Surgical History:  Procedure Laterality Date   ANKLE FRACTURE SURGERY     Left   APPENDECTOMY     IR 3D INDEPENDENT WKST  09/01/2021   IR 3D INDEPENDENT WKST  12/14/2021   IR ANGIO INTRA EXTRACRAN SEL INTERNAL CAROTID BILAT MOD SED  09/01/2021   IR ANGIO INTRA EXTRACRAN SEL INTERNAL CAROTID UNI R MOD SED  12/14/2021   IR ANGIO VERTEBRAL SEL VERTEBRAL BILAT MOD SED  09/01/2021   IR ANGIOGRAM FOLLOW UP STUDY  12/14/2021   IR ANGIOGRAM FOLLOW UP STUDY  12/14/2021   IR ANGIOGRAM FOLLOW UP STUDY  12/14/2021   IR CT HEAD LTD  12/14/2021   IR TRANSCATH/EMBOLIZ  12/14/2021   IR US GUIDE VASC ACCESS RIGHT  09/01/2021   IR US GUIDE VASC ACCESS RIGHT  12/14/2021   RADIOLOGY WITH ANESTHESIA N/A 12/14/2021   Procedure: IR WITH ANESTHESIA EMBOLIZATION;  Surgeon: Baldemar Lenis, MD;  Location[facility] OR;  Service: Radiology;  Laterality: N/A;   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   REVERSE SHOULDER ARTHROPLASTY Left 05/13/2021   Procedure: REVERSE SHOULDER ARTHROPLASTY;  Surgeon: Beverely Low, MD;  Location: [facility];  Service: Orthopedics;  Laterality: Left;  with ISB   REVERSE SHOULDER ARTHROPLASTY Right 10/14/2021   Procedure: REVERSE SHOULDER ARTHROPLASTY;  Surgeon: Beverely Low, MD;  Location: [facility];  Service:  Orthopedics;  Laterality: Right;  with ISB   Patient Active Problem List   Diagnosis Date Noted   Body mass index 30.0-30.9, adult 08/03/2022   Brain aneurysm 12/14/2021   H/O total shoulder replacement, 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  THERAPY DIAG:  Chronic pain of right knee  Stiffness of right knee, not elsewhere classified  Muscle weakness (generalized)  Rationale for Evaluation and Treatment: Rehabilitation  ONSET DATE: 05/14/23 (Surgery date).  SUBJECTIVE:   SUBJECTIVE STATEMENT: Pt reports 4/10 right knee pain today.     PERTINENT HISTORY: RA, bilateral TSA's, left TKA. PAIN:  Are you having pain? Yes: NPRS scale: 4/10 Pain location: Right knee. Pain description: Ache, sore and sharp. Aggravating factors: Movement. Relieving factors: Pain medication.  PRECAUTIONS: Other: No ultrasound.    WEIGHT BEARING RESTRICTIONS: No  FALLS:  Has patient fallen in last 6 months? No  LIVING ENVIRONMENT: Lives with: lives alone Lives in: House/apartment Stairs:  Ramp. Has following equipment at home: Dan Humphreys - 2 wheeled  OCCUPATION: Retired.  PLOF:  Independent  PATIENT GOALS: Not have right knee pain.  Go fishing.  OBJECTIVE:   PATIENT SURVEYS:  FOTO 17.66.  COGNITION: Overall cognitive status:   EDEMA:  Bulky dressing and ACE wrap donned.  PALPATION: C/o diffuse right knee pain currently.  LOWER EXTREMITY ROM:  In supine, right knee extension is -17 degrees and passive to -10 degrees.  Seated he achieved right knee flexion to 72 degrees.  LOWER EXTREMITY MMT:   Patient is unable to perform an antigravity SLR and SAQ at this time.  GAIT: Step-to gait pattern  with a FWW.   TODAY'S TREATMENT:                                                                                                                              DATE: 06/04/23                                   EXERCISE LOG  Exercise Repetitions and Resistance Comments  Nustep Lvl 4 x 15 mins; seat 7-6   Rockerboard x5 mins   Lunges 14" box x 3 mins   Forward Step Ups 6" box x 20 reps   LAQ 2# x 25 reps   Seated Marches 2# x 25 reps   Seated Hip Adduction 2.5 mins   Seated Hip Abduction Red 2.5 mins   Heel slides    Static Extension Stretch 3# x 3 mins Zero Knee    Blank cell = exercise not performed today   Modalities  Date:  Vaso: Knee, 34 degrees; low pressure, 10 mins, Pain and Edema   PATIENT EDUCATION:  Patient has an established HEP from the hospital and is using the Zero Knee to improve extension.  HOME EXERCISE PROGRAM: As above.  ASSESSMENT:  CLINICAL IMPRESSION: Pt arrives for today's treatment session reporting 4/10 right knee pain.  Pt able to demonstrate -3 degrees of knee extension and 115 degrees of right knee flexion.  Pt able to tolerate increased time or reps with all exercises performed today.  Pt encouraged to continue performance of extension stretch utilizing zero knee.  Normal responses to vaso noted upon removal.  Pt reported 2/10 right knee pain at completion of today's treatment session.   OBJECTIVE IMPAIRMENTS: Abnormal gait, decreased activity tolerance, difficulty walking, decreased ROM, decreased strength, increased edema, and pain.   ACTIVITY LIMITATIONS: carrying, lifting, bed mobility, and locomotion level  PARTICIPATION LIMITATIONS: meal prep, cleaning, laundry, and yard work  PERSONAL FACTORS: 1 comorbidity: RA  are also affecting patient's functional outcome.   REHAB POTENTIAL: Excellent  CLINICAL DECISION MAKING: Stable/uncomplicated  EVALUATION COMPLEXITY: Low   GOALS:  SHORT TERM GOALS: Target date: 05/30/23. Ind with an initial  HEP. Goal status: MET  2.  Full active right knee extension. Baseline: 10/8: -5 degrees; 10/15: -3 degrees Goal status: IN PROGRESS  3.  Active right knee flexion to 90  degrees+  Goal status: MET    LONG TERM GOALS: Target date: 06/27/23  Ind with an advanced HEP.  Goal status: IN PROGRESS  2.  Active right knee flexion to 115 degrees+ so the patient can perform functional tasks and do so with pain not > 2-3/10.  Goal status: MET  3.  Increase right hip and knee strength to a solid 4+/5 to provide good stability for accomplishment of functional activities.  Goal status: IN PROGRESS  4.  Perform ADL's with pain not > 2-3/10.  Goal status: IN PROGRESS  5.  Perform a reciprocating stair gait with one railing with pain not > 2-3/10.  Goal status: IN PROGRESS   PLAN:  PT FREQUENCY: 3x/week  PT DURATION: 4 weeks  PLANNED INTERVENTIONS: Therapeutic exercises, Therapeutic activity, Neuromuscular re-education, Gait training, Patient/Family education, Self Care, Electrical stimulation, Cryotherapy, and Manual therapy  PLAN FOR NEXT SESSION: Nustep.  Progress into TKA protocol.  Vasopneumatic with LE elevation.   Newman Pies, PTA 06/05/2023, 9:44 AM

## 2023-06-07 ENCOUNTER — Ambulatory Visit: Payer: Medicare Other

## 2023-06-07 DIAGNOSIS — M6281 Muscle weakness (generalized): Secondary | ICD-10-CM | POA: Diagnosis not present

## 2023-06-07 DIAGNOSIS — M25661 Stiffness of right knee, not elsewhere classified: Secondary | ICD-10-CM

## 2023-06-07 DIAGNOSIS — G8929 Other chronic pain: Secondary | ICD-10-CM

## 2023-06-07 DIAGNOSIS — M25561 Pain in right knee: Secondary | ICD-10-CM | POA: Diagnosis not present

## 2023-06-07 NOTE — Therapy (Signed)
OUTPATIENT PHYSICAL THERAPY LOWER EXTREMITY EVALUATION   Patient Name: Daniel Griffin MRN: [id number] DOB:July 31, 1961, 62 y.o., male Today's Date: 06/07/2023  END OF SESSION:  PT End of Session - 06/07/23 0852     Visit Number 8    Number of Visits 12    Date for PT Re-Evaluation 06/27/23    Authorization Type FOTO.    PT Start Time 0845    PT Stop Time [phone]    PT Time Calculation (min) 57 min    Activity Tolerance Patient tolerated treatment well    Behavior During Therapy WFL for tasks assessed/performed             Past Medical History:  Diagnosis Date   Arthritis    RA   Depression    Seizures (HCC) 06/21/2020   ETOH w/d related   Past Surgical History:  Procedure Laterality Date   ANKLE FRACTURE SURGERY     Left   APPENDECTOMY     IR 3D INDEPENDENT WKST  09/01/2021   IR 3D INDEPENDENT WKST  12/14/2021   IR ANGIO INTRA EXTRACRAN SEL INTERNAL CAROTID BILAT MOD SED  09/01/2021   IR ANGIO INTRA EXTRACRAN SEL INTERNAL CAROTID UNI R MOD SED  12/14/2021   IR ANGIO VERTEBRAL SEL VERTEBRAL BILAT MOD SED  09/01/2021   IR ANGIOGRAM FOLLOW UP STUDY  12/14/2021   IR ANGIOGRAM FOLLOW UP STUDY  12/14/2021   IR ANGIOGRAM FOLLOW UP STUDY  12/14/2021   IR CT HEAD LTD  12/14/2021   IR TRANSCATH/EMBOLIZ  12/14/2021   IR US GUIDE VASC ACCESS RIGHT  09/01/2021   IR US GUIDE VASC ACCESS RIGHT  12/14/2021   RADIOLOGY WITH ANESTHESIA N/A 12/14/2021   Procedure: IR WITH ANESTHESIA EMBOLIZATION;  Surgeon: Baldemar Lenis, MD;  Location: [facility] OR;  Service: Radiology;  Laterality: N/A;   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   REVERSE SHOULDER ARTHROPLASTY Left 05/13/2021   Procedure: REVERSE SHOULDER ARTHROPLASTY;  Surgeon: Beverely Low, MD;  Location: [facility] ORS;  Service: Orthopedics;  Laterality: Left;  with ISB   REVERSE SHOULDER ARTHROPLASTY Right 10/14/2021   Procedure: REVERSE SHOULDER ARTHROPLASTY;  Surgeon: Beverely Low, MD;  Location: [facility] ORS;  Service:  Orthopedics;  Laterality: Right;  with ISB   Patient Active Problem List   Diagnosis Date Noted   Body mass index 30.0-30.9, adult 08/03/2022   Brain aneurysm 12/14/2021   H/O total shoulder replacement, 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  THERAPY DIAG:  Chronic pain of right knee  Stiffness of right knee, not elsewhere classified  Muscle weakness (generalized)  Rationale for Evaluation and Treatment: Rehabilitation  ONSET DATE: 05/14/23 (Surgery date).  SUBJECTIVE:   SUBJECTIVE STATEMENT: Pt reports 4/10 right knee pain today.     PERTINENT HISTORY: RA, bilateral TSA's, left TKA. PAIN:  Are you having pain? Yes: NPRS scale: 4/10 Pain location: Right knee. Pain description: Ache, sore and sharp. Aggravating factors: Movement. Relieving factors: Pain medication.  PRECAUTIONS: Other: No ultrasound.    WEIGHT BEARING RESTRICTIONS: No  FALLS:  Has patient fallen in last 6 months? No  LIVING ENVIRONMENT: Lives with: lives alone Lives in: House/apartment Stairs:  Ramp. Has following equipment at home: Dan Humphreys - 2 wheeled  OCCUPATION: Retired.  PLOF:  Independent  PATIENT GOALS: Not have right knee pain.  Go fishing.  OBJECTIVE:   PATIENT SURVEYS:  FOTO 17.66.  COGNITION: Overall cognitive status:   EDEMA:  Bulky dressing and ACE wrap donned.  PALPATION: C/o diffuse right knee pain currently.  LOWER EXTREMITY ROM:  In supine, right knee extension is -17 degrees and passive to -10 degrees.  Seated he achieved right knee flexion to 72 degrees.  LOWER EXTREMITY MMT:   Patient is unable to perform an antigravity SLR and SAQ at this time.  GAIT: Step-to gait pattern  with a FWW.   TODAY'S TREATMENT:                                                                                                                              DATE: 06/07/23                                   EXERCISE LOG  Exercise Repetitions and Resistance Comments  Nustep Lvl 4 x 15 mins; seat 7-6   Rockerboard x5 mins   Lunges 14" box x 3 mins   Forward Step Ups 6" box x 25 reps   LAQ 3# x 35 reps   Seated Marches 3# x 35 reps   Seated Hip Adduction 3 mins   Seated Hip Abduction Red 3 mins   Heel slides    Static Extension Stretch 4# x 2.5 mins Zero Knee    Blank cell = exercise not performed today   Modalities  Date:  Vaso: Knee, 34 degrees; low pressure, 15 mins, Pain and Edema   PATIENT EDUCATION:  Patient has an established HEP from the hospital and is using the Zero Knee to improve extension.  HOME EXERCISE PROGRAM: As above.  ASSESSMENT:  CLINICAL IMPRESSION: Pt arrives for today's treatment session reporting 4/10 right knee pain.  Pt able to tolerate increased reps, resistance, or time with all exercises performed today.  Pt encouraged to continue use of zero knee at home to assist with right knee extension.  Normal responses to vaso noted upon removal.  Pt denied any pain at completion of today's treatment session.  OBJECTIVE IMPAIRMENTS: Abnormal gait, decreased activity tolerance, difficulty walking, decreased ROM, decreased strength, increased edema, and pain.   ACTIVITY LIMITATIONS: carrying, lifting, bed mobility, and locomotion level  PARTICIPATION LIMITATIONS: meal prep, cleaning, laundry, and yard work  PERSONAL FACTORS: 1 comorbidity: RA  are also affecting patient's functional outcome.   REHAB POTENTIAL: Excellent  CLINICAL DECISION MAKING: Stable/uncomplicated  EVALUATION COMPLEXITY: Low   GOALS:  SHORT TERM GOALS: Target date: 05/30/23. Ind with an initial HEP. Goal status: MET  2.  Full active right knee extension. Baseline: 10/8: -5  degrees; 10/15: -3 degrees Goal status: IN PROGRESS  3.  Active right knee flexion to 90 degrees+  Goal status: MET    LONG TERM GOALS: Target date: 06/27/23  Ind with an advanced HEP.  Goal status: IN PROGRESS  2.  Active right knee flexion to 115 degrees+ so the patient can perform functional tasks and do so with pain not > 2-3/10.  Goal status: MET  3.  Increase right hip and knee strength to a solid 4+/5 to provide good stability for accomplishment of functional activities.  Goal status: IN PROGRESS  4.  Perform ADL's with pain not > 2-3/10.  Goal status: IN PROGRESS  5.  Perform a reciprocating stair gait with one railing with pain not > 2-3/10.  Goal status: IN PROGRESS   PLAN:  PT FREQUENCY: 3x/week  PT DURATION: 4 weeks  PLANNED INTERVENTIONS: Therapeutic exercises, Therapeutic activity, Neuromuscular re-education, Gait training, Patient/Family education, Self Care, Electrical stimulation, Cryotherapy, and Manual therapy  PLAN FOR NEXT SESSION: Nustep.  Progress into TKA protocol.  Vasopneumatic with LE elevation.   Newman Pies, PTA 06/07/2023, 9:44 AM

## 2023-06-08 ENCOUNTER — Other Ambulatory Visit: Payer: Self-pay

## 2023-06-08 NOTE — Telephone Encounter (Signed)
Spoke with patient's sister [name]. She states that she had asked him to go to bank to send this to our clinic but I advised that we ddi not receive anything (only printout from pharmacy) . She states she will try to go with him this afternoon  Chesley Mires, PharmD, MPH, BCPS, CPP Clinical Pharmacist (Rheumatology and Pulmonology)

## 2023-06-11 NOTE — Telephone Encounter (Signed)
Received income documents from patient  Faxed to [facility] BMS w appeal letter and OOP rx printout Case # [id number]  Chesley Mires, PharmD, MPH, BCPS, CPP Clinical Pharmacist (Rheumatology and Pulmonology)

## 2023-06-12 ENCOUNTER — Ambulatory Visit: Payer: Medicare Other

## 2023-06-12 DIAGNOSIS — G8929 Other chronic pain: Secondary | ICD-10-CM

## 2023-06-12 DIAGNOSIS — M6281 Muscle weakness (generalized): Secondary | ICD-10-CM

## 2023-06-12 DIAGNOSIS — M25561 Pain in right knee: Secondary | ICD-10-CM | POA: Diagnosis not present

## 2023-06-12 DIAGNOSIS — M25661 Stiffness of right knee, not elsewhere classified: Secondary | ICD-10-CM

## 2023-06-12 NOTE — Therapy (Signed)
OUTPATIENT PHYSICAL THERAPY LOWER EXTREMITY TREATMENT   Patient Name: ROBERTANTHONY SULLENDER MRN: [id number] DOB:21-Jul-1961, 62 y.o., male Today's Date: 06/12/2023  END OF SESSION:  PT End of Session - 06/12/23 0905     Visit Number 9    Number of Visits 12    Date for PT Re-Evaluation 06/27/23    Authorization Type FOTO.    PT Start Time 0903    PT Stop Time 0944    PT Time Calculation (min) 41 min    Activity Tolerance Patient tolerated treatment well    Behavior During Therapy WFL for tasks assessed/performed             Past Medical History:  Diagnosis Date   Arthritis    RA   Depression    Seizures (HCC) 06/21/2020   ETOH w/d related   Past Surgical History:  Procedure Laterality Date   ANKLE FRACTURE SURGERY     Left   APPENDECTOMY     IR 3D INDEPENDENT WKST  09/01/2021   IR 3D INDEPENDENT WKST  12/14/2021   IR ANGIO INTRA EXTRACRAN SEL INTERNAL CAROTID BILAT MOD SED  09/01/2021   IR ANGIO INTRA EXTRACRAN SEL INTERNAL CAROTID UNI R MOD SED  12/14/2021   IR ANGIO VERTEBRAL SEL VERTEBRAL BILAT MOD SED  09/01/2021   IR ANGIOGRAM FOLLOW UP STUDY  12/14/2021   IR ANGIOGRAM FOLLOW UP STUDY  12/14/2021   IR ANGIOGRAM FOLLOW UP STUDY  12/14/2021   IR CT HEAD LTD  12/14/2021   IR TRANSCATH/EMBOLIZ  12/14/2021   IR US GUIDE VASC ACCESS RIGHT  09/01/2021   IR US GUIDE VASC ACCESS RIGHT  12/14/2021   RADIOLOGY WITH ANESTHESIA N/A 12/14/2021   Procedure: IR WITH ANESTHESIA EMBOLIZATION;  Surgeon: Baldemar Lenis, MD;  Location: [facility] OR;  Service: Radiology;  Laterality: N/A;   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   REVERSE SHOULDER ARTHROPLASTY Left 05/13/2021   Procedure: REVERSE SHOULDER ARTHROPLASTY;  Surgeon: Beverely Low, MD;  Location: [facility] ORS;  Service: Orthopedics;  Laterality: Left;  with ISB   REVERSE SHOULDER ARTHROPLASTY Right 10/14/2021   Procedure: REVERSE SHOULDER ARTHROPLASTY;  Surgeon: Beverely Low, MD;  Location: [facility] ORS;  Service:  Orthopedics;  Laterality: Right;  with ISB   Patient Active Problem List   Diagnosis Date Noted   Body mass index 30.0-30.9, adult 08/03/2022   Brain aneurysm 12/14/2021   H/O total shoulder replacement, 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  THERAPY DIAG:  Chronic pain of right knee  Stiffness of right knee, not elsewhere classified  Muscle weakness (generalized)  Rationale for Evaluation and Treatment: Rehabilitation  ONSET DATE: 05/14/23 (Surgery date).  SUBJECTIVE:   SUBJECTIVE STATEMENT: Pt reports 2/10 right knee pain today.   Pt not using AD today.   PERTINENT HISTORY: RA, bilateral TSA's, left TKA. PAIN:  Are you having pain? Yes: NPRS scale: 2/10 Pain location: Right knee. Pain description: Ache, sore and sharp. Aggravating factors: Movement. Relieving factors: Pain medication.  PRECAUTIONS: Other: No ultrasound.    WEIGHT BEARING RESTRICTIONS: No  FALLS:  Has patient fallen in last 6 months? No  LIVING ENVIRONMENT: Lives with: lives alone Lives in: House/apartment Stairs:  Ramp. Has following equipment at home: Dan Humphreys - 2 wheeled  OCCUPATION: Retired.  PLOF: Independent  PATIENT GOALS: Not have right knee pain.  Go fishing.  OBJECTIVE:   PATIENT SURVEYS:  FOTO 17.66.  COGNITION: Overall cognitive status:   EDEMA:  Bulky dressing and ACE wrap donned.  PALPATION: C/o diffuse right knee pain currently.  LOWER EXTREMITY ROM:  In supine, right knee extension is -17 degrees and passive to -10 degrees.  Seated he achieved right knee flexion to 72 degrees.  LOWER EXTREMITY MMT:   Patient is unable to perform an antigravity SLR and SAQ at this  time.  GAIT: Step-to gait pattern with a FWW.   TODAY'S TREATMENT:                                                                                                                              DATE: 06/12/23                                   EXERCISE LOG  Exercise Repetitions and Resistance Comments  Nustep Lvl 4 x 10 mins; seat 7-6   Rockerboard x5 mins   Lunges 14" box x 3 mins   Forward Step Ups 6" box x 25 reps   LAQ 4# x 25 reps   Seated Marches 4# x 25 reps   Seated Hip Adduction    Seated Hip Abduction    Heel slides    Static Extension Stretch     Blank cell = exercise not performed today   Modalities  Date:  Vaso: Knee, 34 degrees; low pressure, 15 mins, Pain and Edema   PATIENT EDUCATION:  Patient has an established HEP from the hospital and is using the Zero Knee to improve extension.  HOME EXERCISE PROGRAM: As above.  ASSESSMENT:  CLINICAL IMPRESSION: Pt arrives for today's treatment session 17 mins late reporting 2/10 right knee pain.  Pt able to demonstrate reciprocal pattern with navigating four steps and use of one handrail with pain remaining below a 3/10.  Pt able to tolerate increased weight today with seated exercises with fatigue noted.  Normal responses to vaso noted upon removal.  Session abbreviated due to pt arriving late for treatment session.  Pt reported 1/10 right knee pain at completion of today's treatment session.   OBJECTIVE IMPAIRMENTS: Abnormal gait, decreased activity tolerance, difficulty walking, decreased ROM, decreased strength, increased edema, and pain.   ACTIVITY LIMITATIONS: carrying, lifting, bed mobility, and locomotion level  PARTICIPATION LIMITATIONS: meal prep, cleaning, laundry, and yard work  PERSONAL FACTORS: 1 comorbidity: RA  are also affecting patient's functional outcome.   REHAB POTENTIAL: Excellent  CLINICAL DECISION MAKING: Stable/uncomplicated  EVALUATION COMPLEXITY: Low   GOALS:  SHORT TERM GOALS:  Target date: 05/30/23. Ind with an initial HEP. Goal status: MET  2.  Full active right knee extension. Baseline: 10/8: -5 degrees; 10/15: -3 degrees Goal status: IN PROGRESS  3.  Active right knee  flexion to 90 degrees+  Goal status: MET    LONG TERM GOALS: Target date: 06/27/23  Ind with an advanced HEP.  Goal status: IN PROGRESS  2.  Active right knee flexion to 115 degrees+ so the patient can perform functional tasks and do so with pain not > 2-3/10.  Goal status: MET  3.  Increase right hip and knee strength to a solid 4+/5 to provide good stability for accomplishment of functional activities.  Goal status: IN PROGRESS  4.  Perform ADL's with pain not > 2-3/10.  Goal status: IN PROGRESS  5.  Perform a reciprocating stair gait with one railing with pain not > 2-3/10.  Goal status: MET   PLAN:  PT FREQUENCY: 3x/week  PT DURATION: 4 weeks  PLANNED INTERVENTIONS: Therapeutic exercises, Therapeutic activity, Neuromuscular re-education, Gait training, Patient/Family education, Self Care, Electrical stimulation, Cryotherapy, and Manual therapy  PLAN FOR NEXT SESSION: Nustep.  Progress into TKA protocol.  Vasopneumatic with LE elevation.   Newman Pies, PTA 06/12/2023, 9:48 AM

## 2023-06-13 DIAGNOSIS — D485 Neoplasm of uncertain behavior of skin: Secondary | ICD-10-CM | POA: Diagnosis not present

## 2023-06-13 DIAGNOSIS — C44329 Squamous cell carcinoma of skin of other parts of face: Secondary | ICD-10-CM | POA: Diagnosis not present

## 2023-06-14 ENCOUNTER — Other Ambulatory Visit: Payer: Self-pay

## 2023-06-14 ENCOUNTER — Ambulatory Visit: Payer: Medicare Other

## 2023-06-14 DIAGNOSIS — G8929 Other chronic pain: Secondary | ICD-10-CM | POA: Diagnosis not present

## 2023-06-14 DIAGNOSIS — M25661 Stiffness of right knee, not elsewhere classified: Secondary | ICD-10-CM | POA: Diagnosis not present

## 2023-06-14 DIAGNOSIS — M6281 Muscle weakness (generalized): Secondary | ICD-10-CM | POA: Diagnosis not present

## 2023-06-14 DIAGNOSIS — M25561 Pain in right knee: Secondary | ICD-10-CM | POA: Diagnosis not present

## 2023-06-14 NOTE — Therapy (Addendum)
OUTPATIENT PHYSICAL THERAPY LOWER EXTREMITY TREATMENT   Patient Name: Daniel Griffin MRN: [id number] DOB:Jul 24, 1961, 62 y.o., male Today's Date: 06/14/2023  END OF SESSION:  PT End of Session - 06/14/23 0847     Visit Number 10    Number of Visits 12    Date for PT Re-Evaluation 06/27/23    Authorization Type FOTO.    PT Start Time 0845    PT Stop Time 0926    PT Time Calculation (min) 41 min    Activity Tolerance Patient tolerated treatment well    Behavior During Therapy WFL for tasks assessed/performed             Past Medical History:  Diagnosis Date   Arthritis    RA   Depression    Seizures (HCC) 06/21/2020   ETOH w/d related   Past Surgical History:  Procedure Laterality Date   ANKLE FRACTURE SURGERY     Left   APPENDECTOMY     IR 3D INDEPENDENT WKST  09/01/2021   IR 3D INDEPENDENT WKST  12/14/2021   IR ANGIO INTRA EXTRACRAN SEL INTERNAL CAROTID BILAT MOD SED  09/01/2021   IR ANGIO INTRA EXTRACRAN SEL INTERNAL CAROTID UNI R MOD SED  12/14/2021   IR ANGIO VERTEBRAL SEL VERTEBRAL BILAT MOD SED  09/01/2021   IR ANGIOGRAM FOLLOW UP STUDY  12/14/2021   IR ANGIOGRAM FOLLOW UP STUDY  12/14/2021   IR ANGIOGRAM FOLLOW UP STUDY  12/14/2021   IR CT HEAD LTD  12/14/2021   IR TRANSCATH/EMBOLIZ  12/14/2021   IR US GUIDE VASC ACCESS RIGHT  09/01/2021   IR US GUIDE VASC ACCESS RIGHT  12/14/2021   RADIOLOGY WITH ANESTHESIA N/A 12/14/2021   Procedure: IR WITH ANESTHESIA EMBOLIZATION;  Surgeon: Baldemar Lenis, MD;  Location: [facility] OR;  Service: Radiology;  Laterality: N/A;   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   REVERSE SHOULDER ARTHROPLASTY Left 05/13/2021   Procedure: REVERSE SHOULDER ARTHROPLASTY;  Surgeon: Beverely Low, MD;  Location: [facility] ORS;  Service: Orthopedics;  Laterality: Left;  with ISB   REVERSE SHOULDER ARTHROPLASTY Right 10/14/2021   Procedure: REVERSE SHOULDER ARTHROPLASTY;  Surgeon: Beverely Low, MD;  Location: [facility] OR[facility];  Service:  Orthopedics;  Laterality: Right;  with ISB   Patient Active Problem List   Diagnosis Date Noted   Body mass index 30.0-30.9, adult 08/03/2022   Brain aneurysm 12/14/2021   H/O total shoulder replacement, 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  THERAPY DIAG:  Chronic pain of right knee  Stiffness of right knee, not elsewhere classified  Muscle weakness (generalized)  Rationale for Evaluation and Treatment: Rehabilitation  ONSET DATE: 05/14/23 (Surgery date).  SUBJECTIVE:   SUBJECTIVE STATEMENT: Pt reports 2/10 right knee pain today.   Pt ready for discharge today.   PERTINENT HISTORY: RA, bilateral TSA's, left TKA. PAIN:  Are you having pain? Yes: NPRS scale: 2/10 Pain location: Right knee. Pain description: Ache, sore and sharp. Aggravating factors: Movement. Relieving factors: Pain medication.  PRECAUTIONS: Other: No ultrasound.    WEIGHT BEARING RESTRICTIONS: No  FALLS:  Has patient fallen in last 6 months? No  LIVING ENVIRONMENT: Lives with: lives alone Lives in: House/apartment Stairs:  Ramp. Has following equipment at home: Dan Humphreys - 2 wheeled  OCCUPATION: Retired.  PLOF: Independent  PATIENT GOALS: Not have right knee pain.  Go fishing.  OBJECTIVE:   PATIENT SURVEYS:  FOTO 17.66.  COGNITION: Overall cognitive status:   EDEMA:  Bulky dressing and ACE wrap donned.  PALPATION: C/o diffuse right knee pain currently.  LOWER EXTREMITY ROM:  In supine, right knee extension is -17 degrees and passive to -10 degrees.  Seated he achieved right knee flexion to 72 degrees.  LOWER EXTREMITY MMT:   Patient is unable to perform an antigravity SLR and SAQ at this  time.  GAIT: Step-to gait pattern with a FWW.   TODAY'S TREATMENT:                                                                                                                              DATE: 06/14/23                                   EXERCISE LOG  Exercise Repetitions and Resistance Comments  Nustep Lvl 4 x 18 mins; seat 7-6   Rockerboard x6 mins   Lunges 14" box x 3 mins   Forward Step Ups    LAQ    Seated Marches    Seated Hip Adduction    Seated Hip Abduction    Heel slides    Static Extension Stretch     Blank cell = exercise not performed today   Modalities  Date:  Vaso: Knee, 34 degrees; low pressure, 15 mins, Pain and Edema   PATIENT EDUCATION:  Patient has an established HEP from the hospital and is using the Zero Knee to improve extension.  HOME EXERCISE PROGRAM: As above.  ASSESSMENT:  CLINICAL IMPRESSION: Pt arrives for today's treatment session reporting 2/10 right knee pain.  Pt able to increase FOTO score to 71 today.  Pt able to demonstrate -2 degrees of right knee extension making good progress towards his goal.  Pt also able to demonstrate 4+/5 global right knee pain.  Pt has met all of his goals, except his extension goal at this time.  PT encouraged to call the facility with any questions or concerns.  Pt ready for discharge at this time.  Pt reported 0/10 right knee pain at completion of today's treatment session.   OBJECTIVE IMPAIRMENTS: Abnormal gait, decreased activity tolerance, difficulty walking, decreased ROM, decreased strength, increased edema, and pain.   ACTIVITY LIMITATIONS: carrying, lifting, bed mobility, and locomotion level  PARTICIPATION LIMITATIONS: meal prep, cleaning, laundry, and yard work  PERSONAL FACTORS: 1 comorbidity: RA  are also affecting patient's functional outcome.   REHAB POTENTIAL: Excellent  CLINICAL DECISION MAKING: Stable/uncomplicated  EVALUATION COMPLEXITY: Low   GOALS:  SHORT TERM GOALS: Target  date: 05/30/23. Ind with an initial HEP. Goal status: MET  2.  Full active right knee extension. Baseline: 10/8: -5 degrees; 10/15: -3 degrees; 10/24: -2 degrees Goal status:  IN PROGRESS  3.  Active right knee flexion to 90 degrees+  Goal status: MET    LONG TERM GOALS: Target date: 06/27/23  Ind with an advanced HEP.  Goal status: MET  2.  Active right knee flexion to 115 degrees+ so the patient can perform functional tasks and do so with pain not > 2-3/10.  Goal status: MET  3.  Increase right hip and knee strength to a solid 4+/5 to provide good stability for accomplishment of functional activities.  Goal status: MET  4.  Perform ADL's with pain not > 2-3/10.  Goal status: MET  5.  Perform a reciprocating stair gait with one railing with pain not > 2-3/10.  Goal status: MET   PLAN:  PT FREQUENCY: 3x/week  PT DURATION: 4 weeks  PLANNED INTERVENTIONS: Therapeutic exercises, Therapeutic activity, Neuromuscular re-education, Gait training, Patient/Family education, Self Care, Electrical stimulation, Cryotherapy, and Manual therapy  PLAN FOR NEXT SESSION: Nustep.  Progress into TKA protocol.  Vasopneumatic with LE elevation.   Newman Pies, PTA 06/14/2023, 9:27 AM   PHYSICAL THERAPY DISCHARGE SUMMARY  Visits from Start of Care: 10.  Current functional level related to goals / functional outcomes: See above.   Remaining deficits: All goals met.   Education / Equipment: HEP.   Patient agrees to discharge. Patient goals were met. Patient is being discharged due to meeting the stated rehab goals.    Italy Applegate MPT

## 2023-06-14 NOTE — Telephone Encounter (Signed)
Patient's sister [name], reached out to pharmacy about update. Returned call but phone went to VM> Advised that paperwork has been submitted but [employer]MS will require some days to process. Pharmacy team to f/u next week  Chesley Mires, PharmD, MPH, BCPS, CPP Clinical Pharmacist (Rheumatology and Pulmonology)

## 2023-06-14 NOTE — Telephone Encounter (Signed)
Received message from [facility] that patient's sister called them to follow up on income paperwork. Called [employer] to get update on additional submitted documentation, rep advised since patient mentioned a tax return in his medical necessity letter, they will need a copy to complete his review. Called patient and left message.  Phone# [phone]

## 2023-06-18 NOTE — Telephone Encounter (Signed)
Attempted to enroll patient into [employer] through [employer]. Unfortunately, patient is not eligible    Chesley Mires, PharmD, MPH, BCPS, CPP Clinical Pharmacist (Rheumatology and Pulmonology)

## 2023-06-21 DIAGNOSIS — C44329 Squamous cell carcinoma of skin of other parts of face: Secondary | ICD-10-CM | POA: Diagnosis not present

## 2023-07-17 DIAGNOSIS — C44329 Squamous cell carcinoma of skin of other parts of face: Secondary | ICD-10-CM | POA: Diagnosis not present

## 2023-07-17 NOTE — Progress Notes (Unsigned)
Office Visit Note  Patient: Daniel Griffin             Date of Birth: [birth year]           MRN: [id number]             PCP: Bennie Pierini, FNP Referring: Bennie Pierini, * Visit Date: 07/31/2023 Occupation: @GUAROCC @  Subjective:  Discuss restarting orencia   History of Present Illness: Daniel Griffin is a 62 y.o. male with history of seropositive rheumatoid arthritis and osteoarthritis.  Patient has been off of Orencia since summer 2024.  He states that he did not resume Orencia after having his right knee replaced.  He thinks that the knee replacement was in July 2024.  He has completed physical therapy and is following up with the orthopedic surgeon this week.  Patient is currently having increased pain and inflammation involving both hands, both knee replacements, and both ankle joints.  He currently rates his pain a 7 out of 10.  He has been taking Tylenol as needed for pain relief.  He denies any other upcoming surgeries at this time.  He denies any recent or recurrent infections.  Activities of Daily Living:  Patient reports morning stiffness for all day. Patient Reports nocturnal pain.  Difficulty dressing/grooming: Reports Difficulty climbing stairs: Reports Difficulty getting out of chair: Reports Difficulty using hands for taps, buttons, cutlery, and/or writing: Reports  Review of Systems  Constitutional:  Negative for fatigue.  HENT:  Negative for mouth sores and mouth dryness.   Eyes:  Negative for pain, visual disturbance and dryness.  Respiratory:  Positive for shortness of breath. Negative for wheezing.   Cardiovascular:  Negative for chest pain and palpitations.  Gastrointestinal:  Negative for blood in stool, constipation and diarrhea.  Endocrine: Negative for increased urination.  Genitourinary:  Negative for involuntary urination.  Musculoskeletal:  Positive for joint pain, gait problem, joint pain, joint swelling and morning stiffness.  Negative for myalgias, muscle weakness, muscle tenderness and myalgias.  Skin:  Negative for color change, rash and sensitivity to sunlight.  Allergic/Immunologic: Negative for susceptible to infections.  Neurological:  Negative for dizziness and headaches.  Hematological:  Negative for swollen glands.  Psychiatric/Behavioral:  Negative for depressed mood and sleep disturbance. The patient is not nervous/anxious.     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   Syphilis 09/21/2020   Dementia (HCC) 09/21/2020   Rheumatoid arthritis involving multiple sites with positive rheumatoid factor (HCC) 11/30/2016   High risk medications (not anticoagulants) long-term use 11/30/2016    Past Medical History:  Diagnosis Date   Arthritis    RA   Depression    Seizures (HCC) 06/21/2020   ETOH w/d related   Skin cancer of face 2024    Family History  Problem Relation Age of Onset   Alzheimer's disease Father    Healthy Son    Healthy Daughter    Rheum arthritis Daughter    Healthy Daughter    Past Surgical History:  Procedure Laterality Date   ANKLE FRACTURE SURGERY     Left   APPENDECTOMY     IR 3D INDEPENDENT WKST  09/01/2021   IR 3D INDEPENDENT WKST  12/14/2021   IR ANGIO INTRA EXTRACRAN SEL INTERNAL CAROTID BILAT MOD SED  09/01/2021   IR ANGIO INTRA EXTRACRAN SEL INTERNAL CAROTID  UNI R MOD SED  12/14/2021   IR ANGIO VERTEBRAL SEL VERTEBRAL BILAT MOD SED  09/01/2021   IR ANGIOGRAM FOLLOW UP STUDY  12/14/2021   IR ANGIOGRAM FOLLOW UP STUDY  12/14/2021   IR ANGIOGRAM FOLLOW UP STUDY  12/14/2021   IR CT HEAD LTD  12/14/2021   IR TRANSCATH/EMBOLIZ  12/14/2021   IR US GUIDE VASC ACCESS RIGHT  09/01/2021   IR US GUIDE VASC ACCESS RIGHT  12/14/2021   MOHS SURGERY  2024   on face   RADIOLOGY WITH ANESTHESIA N/A 12/14/2021   Procedure: IR  WITH ANESTHESIA EMBOLIZATION;  Surgeon: Baldemar Lenis, MD;  Location: [facility] OR;  Service: Radiology;  Laterality: N/A;   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   REPLACEMENT TOTAL KNEE Right 02/2023   REVERSE SHOULDER ARTHROPLASTY Left 05/13/2021   Procedure: REVERSE SHOULDER ARTHROPLASTY;  Surgeon: Beverely Low, MD;  Location: [facility];  Service: Orthopedics;  Laterality: Left;  with ISB   REVERSE SHOULDER ARTHROPLASTY Right 10/14/2021   Procedure: REVERSE SHOULDER ARTHROPLASTY;  Surgeon: Beverely Low, MD;  Location: [facility];  Service: Orthopedics;  Laterality: Right;  with ISB   Social History   Social History Narrative   Lives alone   Right Handed   Rarely drinks caffeine       Immunization History  Administered Date(s) Administered   Influenza, Seasonal, Injecte, Preservative Fre 05/07/2023   Influenza,inj,Quad PF,6+ Mos 07/04/2018, 08/03/2022   Tdap 05/25/2023     Objective: Vital Signs: BP 122/75 (BP Location: Right Arm, Patient Position: Sitting, Cuff Size: Normal)   Pulse 75   Resp 16   Ht 5\' 6"  (1.676 m)   Wt 186 lb 9.6 oz (84.6 kg)   BMI 30.12 kg/m    Physical Exam Vitals and nursing note reviewed.  Constitutional:      Appearance: He is well-developed.  HENT:     Head: Normocephalic and atraumatic.  Eyes:     Conjunctiva/sclera: Conjunctivae normal.     Pupils: Pupils are equal, round, and reactive to light.  Cardiovascular:     Rate and Rhythm: Normal rate and regular rhythm.     Heart sounds: Normal heart sounds.  Pulmonary:     Effort: Pulmonary effort is normal.     Breath sounds: Normal breath sounds.  Abdominal:     General: Bowel sounds are normal.     Palpations: Abdomen is soft.  Musculoskeletal:     Cervical back: Normal range of motion and neck supple.  Skin:    General: Skin is warm and dry.     Capillary Refill: Capillary refill takes less than 2 seconds.  Neurological:     Mental Status: He is alert and oriented to person,  place, and time.  Psychiatric:        Behavior: Behavior normal.      Musculoskeletal Exam: C-spine has limited range of motion with lateral rotation.  Midline spinal tenderness in the lumbar region and over both SI joints.  Shoulder replacements have limited internal rotation but full abduction.  Elbow joints have good range of motion.  Tenderness and inflammation noted in both wrist joints.  Limited extension of both wrist noted.  Difficulty making a complete fist bilaterally.  Tenderness and synovitis over bilateral second and third MCP joints and the right fourth MCP joint.  Hip joints have good range of motion.  Painful range of motion of both knee replacements with warmth bilaterally.  Tenderness and warmth of both ankles noted.  CDAI  Exam: CDAI Score: 28  Patient Global: 70 / 100; Provider Global: 70 / 100 Swollen: 7 ; Tender: 7  Joint Exam 07/31/2023      Right  Left  Wrist  Swollen Tender  Swollen Tender  MCP 2  Swollen Tender  Swollen Tender  MCP 3  Swollen Tender  Swollen Tender  MCP 4  Swollen Tender        Investigation: No additional findings.  Imaging: No results found.  Recent Labs: Lab Results  Component Value Date   WBC 6.0 04/30/2023   HGB 15.3 04/30/2023   PLT 243 04/30/2023   NA 134 (L) 04/30/2023   K 4.5 04/30/2023   CL 96 (L) 04/30/2023   CO2 25 04/30/2023   GLUCOSE 83 04/30/2023   BUN 6 (L) 04/30/2023   CREATININE 0.68 (L) 04/30/2023   BILITOT 0.8 04/30/2023   ALKPHOS 94 02/02/2023   AST 43 (H) 04/30/2023   ALT 34 04/30/2023   PROT 7.5 04/30/2023   ALBUMIN 4.2 02/02/2023   CALCIUM 9.4 04/30/2023   GFRAA 112 02/25/2021   QFTBGOLD NEGATIVE 07/03/2017   QFTBGOLDPLUS NEGATIVE 10/02/2022    Speciality Comments: Prior therapy includes: Enbrel (pancytopenia) and methotrexate (inadequate response) Orencia-07/09/18 Enbrel-1/19-10/19  Procedures:  No procedures performed Allergies: Patient has no known allergies.   Assessment / Plan:      Visit Diagnoses: Rheumatoid arthritis involving multiple sites with positive rheumatoid factor [facility]): Patient presents today experiencing increased pain and inflammation involving both wrists, both hands, both knee replacements, and both ankle joints.  Inflammation noted in both wrist joints and several MCP joints as described above.  Warmth of both ankles noted.  He currently rates his pain a 7 out of 10.  He has been taking Tylenol as needed for pain relief.  He has been off of Orencia since summer 2024.  He has had a change in insurance and will require a new prior authorization to be initiated.  Once Dub Amis has been approved he will return to the office to reinitiate therapy.  In the meantime a prednisone taper starting at 20 mg tapering by 5 mg every 4 days was sent to the pharmacy.  Instructions were provided including taking prednisone in the morning with food and avoid any use of NSAIDs.  He voiced understanding.  He will notify us if his symptoms persist or worsen.  He will follow-up in the office in 3 months or sooner if needed.  High risk medications (not anticoagulants) long-term use -Plan to reapply for Orencia 125 mg sq injections once weekly. He will restart Orencia in the office once approved.  Previous therapy: Enbrel, methotrexate  CBC and CMP updated on 04/30/23.  Orders for CBC and CMP released today.  TB gold negative on 10/02/22.  No recent or recurrent infections. Discussed the importance of holding orencia if he develops signs or symptoms of an infection and to resume once the infection has completley cleared.   - Plan: COMPLETE METABOLIC PANEL WITH GFR, CBC with Differential/Platelet, QuantiFERON-TB Gold Plus  Screening for tuberculosis - Future order for TB gold placed today. Plan: QuantiFERON-TB Gold Plus  Status post replacement of both shoulder joints - Reverse RTSR by Dr. Ranell Patrick on 10/14/21.RLTSR May 13, 2022.  Doing well.  Good ROM with no discomfort.   Primary  osteoarthritis of both hands: PIP and DIP thickening consistent with osteoarthritis of both hands.  He has been experiencing increased pain and stiffness involving both hands and both wrist joints.  He has tenderness  and inflammation in both wrist as well as over several MCP joints.  A prednisone taper sent to the pharmacy as discussed above.  S/P total knee arthroplasty, left - Under the care of Dr. Durwin Nora. Warmth noted.   S/P total knee arthroplasty, right: Performed by Dr. Durwin Nora.  Completed PT. Upcoming routine office visit. Warmth. No effusion noted.   Primary osteoarthritis of both feet: He presents today with increased pain and inflammation involving both ankles.  He has had difficulty ambulating.  Other medical conditions are listed as follows:  S/P coil embolization of cerebral aneurysm  Latent syphilis  History of alcohol abuse  History of neutropenia: Absolute neutrophils were within normal limits on 04/30/2023.  CBC with differential updated today.    Orders: Orders Placed This Encounter  Procedures   COMPLETE METABOLIC PANEL WITH GFR   CBC with Differential/Platelet   QuantiFERON-TB Gold Plus   Meds ordered this encounter  Medications   predniSONE (DELTASONE) 5 MG tablet    Sig: Take 4 tabs po x 4 days, 3  tabs po x 4 days, 2  tabs po x 4 days, 1  tab po x 4 days    Dispense:  40 tablet    Refill:  0    Follow-Up Instructions: Return in about 3 months (around 10/29/2023) for Rheumatoid arthritis, Osteoarthritis.   Gearldine Bienenstock, PA-C  Note - This record has been created using Dragon software.  Chart creation errors have been sought, but may not always  have been located. Such creation errors do not reflect on  the standard of medical care.

## 2023-07-31 ENCOUNTER — Ambulatory Visit: Payer: Medicare Other | Attending: Physician Assistant | Admitting: Physician Assistant

## 2023-07-31 ENCOUNTER — Encounter: Payer: Self-pay | Admitting: Physician Assistant

## 2023-07-31 VITALS — BP 122/75 | HR 75 | Resp 16 | Ht 66.0 in | Wt 186.6 lb

## 2023-07-31 DIAGNOSIS — Z79899 Other long term (current) drug therapy: Secondary | ICD-10-CM

## 2023-07-31 DIAGNOSIS — Z96652 Presence of left artificial knee joint: Secondary | ICD-10-CM | POA: Diagnosis not present

## 2023-07-31 DIAGNOSIS — M0579 Rheumatoid arthritis with rheumatoid factor of multiple sites without organ or systems involvement: Secondary | ICD-10-CM | POA: Diagnosis not present

## 2023-07-31 DIAGNOSIS — Z96611 Presence of right artificial shoulder joint: Secondary | ICD-10-CM | POA: Diagnosis not present

## 2023-07-31 DIAGNOSIS — Z96651 Presence of right artificial knee joint: Secondary | ICD-10-CM

## 2023-07-31 DIAGNOSIS — M19042 Primary osteoarthritis, left hand: Secondary | ICD-10-CM

## 2023-07-31 DIAGNOSIS — M1711 Unilateral primary osteoarthritis, right knee: Secondary | ICD-10-CM

## 2023-07-31 DIAGNOSIS — M19071 Primary osteoarthritis, right ankle and foot: Secondary | ICD-10-CM | POA: Diagnosis not present

## 2023-07-31 DIAGNOSIS — Z9889 Other specified postprocedural states: Secondary | ICD-10-CM | POA: Diagnosis not present

## 2023-07-31 DIAGNOSIS — M19041 Primary osteoarthritis, right hand: Secondary | ICD-10-CM

## 2023-07-31 DIAGNOSIS — M19072 Primary osteoarthritis, left ankle and foot: Secondary | ICD-10-CM

## 2023-07-31 DIAGNOSIS — F1011 Alcohol abuse, in remission: Secondary | ICD-10-CM

## 2023-07-31 DIAGNOSIS — Z111 Encounter for screening for respiratory tuberculosis: Secondary | ICD-10-CM

## 2023-07-31 DIAGNOSIS — Z862 Personal history of diseases of the blood and blood-forming organs and certain disorders involving the immune mechanism: Secondary | ICD-10-CM | POA: Diagnosis not present

## 2023-07-31 DIAGNOSIS — A530 Latent syphilis, unspecified as early or late: Secondary | ICD-10-CM

## 2023-07-31 DIAGNOSIS — Z96612 Presence of left artificial shoulder joint: Secondary | ICD-10-CM

## 2023-07-31 MED ORDER — PREDNISONE 5 MG PO TABS: ORAL_TABLET | ORAL | 0 refills | Status: DC

## 2023-07-31 NOTE — Progress Notes (Signed)
CBC WNL

## 2023-08-01 LAB — COMPLETE METABOLIC PANEL WITH GFR
AG Ratio: 1.3 (calc) (ref 1.0–2.5)
ALT: 19 U/L (ref 9–46)
AST: 25 U/L (ref 10–35)
Albumin: 3.8 g/dL (ref 3.6–5.1)
Alkaline phosphatase (APISO): 76 U/L (ref 35–144)
BUN/Creatinine Ratio: 10 (calc) (ref 6–22)
BUN: 6 mg/dL — ABNORMAL LOW (ref 7–25)
CO2: 29 mmol/L (ref 20–32)
Calcium: 9.1 mg/dL (ref 8.6–10.3)
Chloride: 90 mmol/L — ABNORMAL LOW (ref 98–110)
Creat: 0.6 mg/dL — ABNORMAL LOW (ref 0.70–1.35)
Globulin: 3 g/dL (ref 1.9–3.7)
Glucose, Bld: 103 mg/dL — ABNORMAL HIGH (ref 65–99)
Potassium: 5.1 mmol/L (ref 3.5–5.3)
Sodium: 128 mmol/L — ABNORMAL LOW (ref 135–146)
Total Bilirubin: 0.5 mg/dL (ref 0.2–1.2)
Total Protein: 6.8 g/dL (ref 6.1–8.1)
eGFR: 108 mL/min/{1.73_m2} (ref >=60)

## 2023-08-01 LAB — CBC WITH DIFFERENTIAL/PLATELET
Absolute Lymphocytes: 1283 {cells}/uL (ref 850–3900)
Absolute Monocytes: 784 {cells}/uL (ref 200–950)
Basophils Absolute: 48 {cells}/uL (ref 0–200)
Basophils Relative: 0.9 %
Eosinophils Absolute: 111 {cells}/uL (ref 15–500)
Eosinophils Relative: 2.1 %
HCT: 40.8 % (ref 38.5–50.0)
Hemoglobin: 13.8 g/dL (ref 13.2–17.1)
MCH: 31 pg (ref 27.0–33.0)
MCHC: 33.8 g/dL (ref 32.0–36.0)
MCV: 91.7 fL (ref 80.0–100.0)
MPV: 8.8 fL (ref 7.5–12.5)
Monocytes Relative: 14.8 %
Neutro Abs: 3074 {cells}/uL (ref 1500–7800)
Neutrophils Relative %: 58 %
Platelets: 328 10*3/uL (ref 140–400)
RBC: 4.45 10*6/uL (ref 4.20–5.80)
RDW: 12.3 % (ref 11.0–15.0)
Total Lymphocyte: 24.2 %
WBC: 5.3 10*3/uL (ref 3.8–10.8)

## 2023-08-01 NOTE — Progress Notes (Signed)
Creatinine remains low.  Sodium and chloride are low and continue to trend down.  Please clarify how much water he drank before his morning appointment yesterday.  Please notify the patient and forward results to PCP--he may require further work up.

## 2023-08-03 ENCOUNTER — Ambulatory Visit (INDEPENDENT_AMBULATORY_CARE_PROVIDER_SITE_OTHER): Payer: Medicare Other | Admitting: Nurse Practitioner

## 2023-08-03 ENCOUNTER — Encounter: Payer: Self-pay | Admitting: Nurse Practitioner

## 2023-08-03 VITALS — BP 128/82 | HR 78 | Temp 97.7°F | Resp 20 | Ht 66.0 in | Wt 186.0 lb

## 2023-08-03 DIAGNOSIS — F03B4 Unspecified dementia, moderate, with anxiety: Secondary | ICD-10-CM

## 2023-08-03 DIAGNOSIS — I671 Cerebral aneurysm, nonruptured: Secondary | ICD-10-CM

## 2023-08-03 DIAGNOSIS — F322 Major depressive disorder, single episode, severe without psychotic features: Secondary | ICD-10-CM | POA: Insufficient documentation

## 2023-08-03 DIAGNOSIS — I10 Essential (primary) hypertension: Secondary | ICD-10-CM | POA: Insufficient documentation

## 2023-08-03 DIAGNOSIS — Z6830 Body mass index (BMI) 30.0-30.9, adult: Secondary | ICD-10-CM | POA: Diagnosis not present

## 2023-08-03 DIAGNOSIS — M0579 Rheumatoid arthritis with rheumatoid factor of multiple sites without organ or systems involvement: Secondary | ICD-10-CM

## 2023-08-03 DIAGNOSIS — R6889 Other general symptoms and signs: Secondary | ICD-10-CM | POA: Diagnosis not present

## 2023-08-03 LAB — LIPID PANEL

## 2023-08-03 MED ORDER — LISINOPRIL 20 MG PO TABS: 20.0000 mg | ORAL_TABLET | Freq: Every day | ORAL | 1 refills | Status: DC

## 2023-08-03 MED ORDER — ESCITALOPRAM OXALATE 10 MG PO TABS: 10.0000 mg | ORAL_TABLET | Freq: Every day | ORAL | 1 refills | Status: DC

## 2023-08-03 MED ORDER — BUPROPION HCL ER (XL) 150 MG PO TB24: 150.0000 mg | ORAL_TABLET | Freq: Every day | ORAL | 1 refills | Status: DC

## 2023-08-03 NOTE — Patient Instructions (Signed)

## 2023-08-03 NOTE — Progress Notes (Signed)
Subjective:    Patient ID: Daniel Griffin, male    DOB: Jan 07, 1961, 62 y.o.   MRN: [id number]   Chief Complaint: medical management of chronic issues     HPI:   Daniel Griffin is a 62 y.o. who identifies as a male who was assigned male at birth.   Social history: Lives with: by himself Work history: disability   Comes in today for follow up of the following chronic medical issues:  1. Moderate dementia with anxiety, unspecified dementia type [facility]) Patient is on lexapro and seems to be doing ok    08/03/2023   10:26 AM 05/07/2023   12:26 PM 02/02/2023   10:28 AM  Depression screen PHQ 2/9  Decreased Interest 0 0 0  Down, Depressed, Hopeless 0 0 0  PHQ - 2 Score 0 0 0  Altered sleeping 0 0 0  Tired, decreased energy 0 0 0  Change in appetite 0 0 0  Feeling bad or failure about yourself  0 0 0  Trouble concentrating 0 0 0  Moving slowly or fidgety/restless 0 0 0  Suicidal thoughts 0 0 0  PHQ-9 Score 0 0 0  Difficult doing work/chores Not difficult at all Not difficult at all Not difficult at all     2. Rheumatoid arthritis involving multiple sites with positive rheumatoid factor (HCC) Has had multiple surgeries for joints lately. Still has daily pain. Sees rheumatology every 6 months. He says he is really achy  today  3. Body mass index 30.0-30.9, adult No recent weight changes Wt Readings from Last 3 Encounters:  08/03/23 186 lb (84.4 kg)  07/31/23 186 lb 9.6 oz (84.6 kg)  05/25/23 187 lb (84.8 kg)   BMI Readings from Last 3 Encounters:  08/03/23 30.02 kg/m  07/31/23 30.12 kg/m  05/25/23 30.18 kg/m     4. Brain aneurysm Has some memory issues and some thought process issues but is doiing ok. Denies any headaches   New complaints: His dauhter says that he has been drinking again. Not as much as he was but had stooped for almost a year.  No Known Allergies Outpatient Encounter Medications as of 08/03/2023  Medication Sig   Abatacept (ORENCIA  CLICKJECT) 125 MG/ML SOAJ INJECT 125 MG INTO THE SKIN ONCE A WEEK. (Patient not taking: Reported on 05/25/2023)   buPROPion (WELLBUTRIN XL) 150 MG 24 hr tablet TAKE 1 TABLET BY MOUTH EVERY DAY   escitalopram (LEXAPRO) 10 MG tablet TAKE 1 TABLET BY MOUTH EVERY DAY   lisinopril (ZESTRIL) 20 MG tablet Take 1 tablet (20 mg total) by mouth daily.   predniSONE (DELTASONE) 5 MG tablet Take 4 tabs po x 4 days, 3  tabs po x 4 days, 2  tabs po x 4 days, 1  tab po x 4 days   No facility-administered encounter medications on file as of 08/03/2023.    Past Surgical History:  Procedure Laterality Date   ANKLE FRACTURE SURGERY     Left   APPENDECTOMY     IR 3D INDEPENDENT WKST  09/01/2021   IR 3D INDEPENDENT WKST  12/14/2021   IR ANGIO INTRA EXTRACRAN SEL INTERNAL CAROTID BILAT MOD SED  09/01/2021   IR ANGIO INTRA EXTRACRAN SEL INTERNAL CAROTID UNI R MOD SED  12/14/2021   IR ANGIO VERTEBRAL SEL VERTEBRAL BILAT MOD SED  09/01/2021   IR ANGIOGRAM FOLLOW UP STUDY  12/14/2021   IR ANGIOGRAM FOLLOW UP STUDY  12/14/2021   IR ANGIOGRAM FOLLOW UP STUDY  12/14/2021   IR CT HEAD LTD  12/14/2021   IR TRANSCATH/EMBOLIZ  12/14/2021   IR US GUIDE VASC ACCESS RIGHT  09/01/2021   IR US GUIDE VASC ACCESS RIGHT  12/14/2021   MOHS SURGERY  2024   on face   RADIOLOGY WITH ANESTHESIA N/A 12/14/2021   Procedure: IR WITH ANESTHESIA EMBOLIZATION;  Surgeon: Baldemar Lenis, MD;  Location: [facility] OR;  Service: Radiology;  Laterality: N/A;   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   REPLACEMENT TOTAL KNEE Right 02/2023   REVERSE SHOULDER ARTHROPLASTY Left 05/13/2021   Procedure: REVERSE SHOULDER ARTHROPLASTY;  Surgeon: Beverely Low, MD;  Location: [facility];  Service: Orthopedics;  Laterality: Left;  with ISB   REVERSE SHOULDER ARTHROPLASTY Right 10/14/2021   Procedure: REVERSE SHOULDER ARTHROPLASTY;  Surgeon: Beverely Low, MD;  Location: [facility];  Service: Orthopedics;  Laterality: Right;  with ISB    Family History   Problem Relation Age of Onset   Alzheimer's disease Father    Healthy Son    Healthy Daughter    Rheum arthritis Daughter    Healthy Daughter       Controlled substance contract: n/a     Review of Systems  Constitutional:  Negative for diaphoresis.  Eyes:  Negative for pain.  Respiratory:  Negative for shortness of breath.   Cardiovascular:  Negative for chest pain, palpitations and leg swelling.  Gastrointestinal:  Negative for abdominal pain.  Endocrine: Negative for polydipsia.  Musculoskeletal:  Positive for arthralgias.  Skin:  Negative for rash.  Neurological:  Negative for dizziness, weakness and headaches.  Hematological:  Does not bruise/bleed easily.  All other systems reviewed and are negative.      Objective:   Physical Exam Vitals and nursing note reviewed.  Constitutional:      Appearance: Normal appearance. He is well-developed.  HENT:     Head: Normocephalic.     Nose: Nose normal.     Mouth/Throat:     Mouth: Mucous membranes are moist.     Pharynx: Oropharynx is clear.  Eyes:     Pupils: Pupils are equal, round, and reactive to light.  Neck:     Thyroid: No thyroid mass or thyromegaly.     Vascular: No carotid bruit or JVD.     Trachea: Phonation normal.  Cardiovascular:     Rate and Rhythm: Normal rate and regular rhythm.  Pulmonary:     Effort: Pulmonary effort is normal. No respiratory distress.     Breath sounds: Normal breath sounds.  Abdominal:     General: Bowel sounds are normal.     Palpations: Abdomen is soft.     Tenderness: There is no abdominal tenderness.  Musculoskeletal:        General: Normal range of motion.     Cervical back: Normal range of motion and neck supple.  Lymphadenopathy:     Cervical: No cervical adenopathy.  Skin:    General: Skin is warm and dry.  Neurological:     Mental Status: He is alert and oriented to person, place, and time.  Psychiatric:        Behavior: Behavior normal.        Thought  Content: Thought content normal.        Judgment: Judgment normal.     BP 128/82 (BP Location: Right Arm)   Pulse 78   Temp 97.7 F (36.5 C) (Temporal)   Resp 20   Ht 5\' 6"  (1.676 m)   Wt 186  lb (84.4 kg)   SpO2 97%   BMI 30.02 kg/m        Assessment & Plan:  Daniel Griffin comes in today with chief complaint of Medical Management of Chronic Issues   Diagnosis and orders addressed:  1. Moderate dementia with anxiety, unspecified dementia type (HCC) (Primary) Orient daily  2. Rheumatoid arthritis involving multiple sites with positive rheumatoid factor (HCC) Keep follow up with rheumatology - CBC with Differential/Platelet - CMP14+EGFR - Lipid panel  3. Body mass index 30.0-30.9, adult  4. Brain aneurysm Report amy headaches  5. Depression, major, single episode, severe (HCC) Stress management - escitalopram (LEXAPRO) 10 MG tablet; Take 1 tablet (10 mg total) by mouth daily.  Dispense: 90 tablet; Refill: 1  6. Primary hypertension Low sodium diet - lisinopril (ZESTRIL) 20 MG tablet; Take 1 tablet (20 mg total) by mouth daily.  Dispense: 90 tablet; Refill: 1   Labs pending Health Maintenance reviewed Diet and exercise encouraged  Follow up plan: 6 months   Mary-Margaret Daphine Deutscher, FNP

## 2023-08-04 LAB — CMP14+EGFR
ALT: 28 IU/L (ref 0–44)
AST: 30 IU/L (ref 0–40)
Albumin: 4.1 g/dL (ref 3.9–4.9)
Alkaline Phosphatase: 81 IU/L (ref 44–121)
BUN/Creatinine Ratio: 12 (ref 10–24)
BUN: 9 mg/dL (ref 8–27)
Bilirubin Total: 0.6 mg/dL (ref 0.0–1.2)
CO2: 23 mmol/L (ref 20–29)
Calcium: 9.4 mg/dL (ref 8.6–10.2)
Chloride: 94 mmol/L — ABNORMAL LOW (ref 96–106)
Creatinine, Ser: 0.73 mg/dL — ABNORMAL LOW (ref 0.76–1.27)
Globulin, Total: 3.2 g/dL (ref 1.5–4.5)
Glucose: 112 mg/dL — ABNORMAL HIGH (ref 70–99)
Potassium: 5 mmol/L (ref 3.5–5.2)
Sodium: 135 mmol/L (ref 134–144)
Total Protein: 7.3 g/dL (ref 6.0–8.5)
eGFR: 102 mL/min/{1.73_m2} (ref >59)

## 2023-08-04 LAB — CBC WITH DIFFERENTIAL/PLATELET
Basophils Absolute: 0.1 10*3/uL (ref 0.0–0.2)
Basos: 1 %
EOS (ABSOLUTE): 0 10*3/uL (ref 0.0–0.4)
Eos: 1 %
Hematocrit: 43.1 % (ref 37.5–51.0)
Hemoglobin: 14.6 g/dL (ref 13.0–17.7)
Immature Grans (Abs): 0 10*3/uL (ref 0.0–0.1)
Immature Granulocytes: 0 %
Lymphocytes Absolute: 0.8 10*3/uL (ref 0.7–3.1)
Lymphs: 12 %
MCH: 31 pg (ref 26.6–33.0)
MCHC: 33.9 g/dL (ref 31.5–35.7)
MCV: 92 fL (ref 79–97)
Monocytes Absolute: 0.4 10*3/uL (ref 0.1–0.9)
Monocytes: 6 %
Neutrophils Absolute: 5 10*3/uL (ref 1.4–7.0)
Neutrophils: 80 %
Platelets: 358 10*3/uL (ref 150–450)
RBC: 4.71 x10E6/uL (ref 4.14–5.80)
RDW: 12.2 % (ref 11.6–15.4)
WBC: 6.3 10*3/uL (ref 3.4–10.8)

## 2023-08-04 LAB — LIPID PANEL
Cholesterol, Total: 164 mg/dL (ref 100–199)
HDL: 99 mg/dL (ref >39)
LDL CALC COMMENT:: 1.7 ratio (ref 0.0–5.0)
LDL Chol Calc (NIH): 56 mg/dL (ref 0–99)
Triglycerides: 42 mg/dL (ref 0–149)
VLDL Cholesterol Cal: 9 mg/dL (ref 5–40)

## 2023-08-24 ENCOUNTER — Other Ambulatory Visit (HOSPITAL_COMMUNITY): Payer: Self-pay

## 2023-08-24 ENCOUNTER — Telehealth: Payer: Self-pay | Admitting: Physician Assistant

## 2023-08-24 DIAGNOSIS — Z79899 Other long term (current) drug therapy: Secondary | ICD-10-CM

## 2023-08-24 DIAGNOSIS — M0579 Rheumatoid arthritis with rheumatoid factor of multiple sites without organ or systems involvement: Secondary | ICD-10-CM

## 2023-08-24 NOTE — Telephone Encounter (Signed)
 Pts wife called wanting to know what they should do about the orencia  injections. Pt states last time they were here they gave all the information needed for the pt assistance. Pt states they needed the last income taxes and pts wife faxed it over. Pt would like to know something soon due to him being in pain.

## 2023-08-27 NOTE — Telephone Encounter (Signed)
 I have faxed all income documents to [employer] for his Orencia  PAP. It's been more than 6 months since we started application processed so it is highly unlikely they will continue to utilize same case number  Case # [id number]  Sherry Pennant, PharmD, MPH, BCPS, CPP Clinical Pharmacist (Rheumatology and Pulmonology)

## 2023-08-31 MED ORDER — ORENCIA CLICKJECT 125 MG/ML ~~LOC~~ SOAJ
125.0000 mg | SUBCUTANEOUS | 0 refills | Status: DC
  Filled 2023-09-04: qty 4, 28d supply, fill #0
  Filled 2023-09-25: qty 4, 28d supply, fill #1
  Filled 2023-10-26 (×2): qty 4, 28d supply, fill #2

## 2023-08-31 NOTE — Telephone Encounter (Signed)
 Enrolled patient into RA grant through [employer]: Award Period: 03/04/2023 - 08/30/2024 ID: [id number] BIN: [id number] PCN: [id number] Group: [id number] For pharmacy inquiries, contact PDMI at [phone].  Rx sent to [facility]  Sherry Pennant, PharmD, MPH, BCPS, CPP Clinical Pharmacist (Rheumatology and Pulmonology)

## 2023-09-03 NOTE — Telephone Encounter (Signed)
 Spoke with patient's sister [name] regarding grant enrollment.Advised that Alwin will reach out at some point this week to schdeule shipment of Orencia  to home. She confirmed that she is point of contact  Sherry Pennant, PharmD, MPH, BCPS, CPP Clinical Pharmacist (Rheumatology and Pulmonology)

## 2023-09-04 ENCOUNTER — Other Ambulatory Visit (HOSPITAL_COMMUNITY): Payer: Self-pay

## 2023-09-04 ENCOUNTER — Other Ambulatory Visit: Payer: Self-pay

## 2023-09-04 NOTE — Progress Notes (Signed)
 Specialty Pharmacy Initial Fill Coordination Note  Daniel Griffin is a 63 y.o. male contacted today regarding initial fill of specialty medication(s) Abatacept  (Orencia  ClickJect)   Patient requested Delivery   Delivery date: 09/06/23   Verified address: [address]  [place] [id number]   Medication will be filled on 09/04/22.   Patient has grant on file and is aware of $0 copayment.   Pt does not have MyChart, please send [employer] via mail.

## 2023-09-05 ENCOUNTER — Other Ambulatory Visit (HOSPITAL_COMMUNITY): Payer: Self-pay

## 2023-09-05 ENCOUNTER — Telehealth: Payer: Self-pay

## 2023-09-05 NOTE — Telephone Encounter (Signed)
 Received notification from [facility] regarding a prior authorization for ORENCIA  SQ. Authorization has been APPROVED from 09/05/2023 to 08/20/2024. Approval letter sent to scan center.  Authorization # [id number]

## 2023-09-05 NOTE — Telephone Encounter (Signed)
 Received notification from [facility] pharmacy that patient requires a new authorization for their medication.  Submitted an URGENT Prior Authorization request to [facility] for ORENCIA  SQ via CoverMyMeds. Will update once we receive a response.  Key: [id number]

## 2023-09-14 NOTE — Progress Notes (Signed)
 Patient with RA who has been off of Orencia  SQ due to insurance changes and access issues with patient assistance program. Enrolled into RA grant  Will continue Orencia  125mg  SQ every 7 days as monotherapy  Franklyn Cafaro, PharmD, MPH, BCPS, CPP Clinical Pharmacist (Rheumatology and Pulmonology)

## 2023-09-20 ENCOUNTER — Other Ambulatory Visit (HOSPITAL_COMMUNITY): Payer: Self-pay

## 2023-09-25 ENCOUNTER — Other Ambulatory Visit: Payer: Self-pay

## 2023-09-25 NOTE — Progress Notes (Signed)
 Specialty Pharmacy Refill Coordination Note  Daniel Griffin is a 63 y.o. male contacted today regarding refills of specialty medication(s) Abatacept  (Orencia  ClickJect)   Patient requested Delivery   Delivery date: 10/02/23   Verified address: [address]   [place] [address]   Medication will be filled on 10/01/23.

## 2023-10-01 ENCOUNTER — Other Ambulatory Visit: Payer: Self-pay

## 2023-10-15 NOTE — Progress Notes (Signed)
 Office Visit Note  Patient: Daniel Griffin             Date of Birth: [birth year]           MRN: [id number]             PCP: Bennie Pierini, FNP Referring: Bennie Pierini, * Visit Date: 10/29/2023 Occupation: @GUAROCC @  Subjective:  Medication monitoring   History of Present Illness: Daniel Griffin is a 63 y.o. male with history seropositive rheumatoid arthritis and osteoarthritis. Patient is currently on Orencia 125 mg sq injections every 7 days.  Patient reports that he resumed Orencia about 8 weeks ago.  He has been tolerating Orencia without any side effects or injection site reactions.  He has noticed a significant improvement in his symptoms since resuming Orencia as prescribed.  He denies any joint swelling at this time.  He states that he has had some increase stiffness as well as nocturnal pain in his right knee replacement which he attributes to cooler weather temperatures.  He has continued home exercises for both knee replacements as recommended.  He has been taking Tylenol sparingly for pain relief.  He denies any new medical conditions.  He denies any recent or recurrent infections.    Activities of Daily Living:  Patient reports morning stiffness for 40 minutes.   Patient Denies nocturnal pain.  Difficulty dressing/grooming: Reports Difficulty climbing stairs: Reports Difficulty getting out of chair: Reports Difficulty using hands for taps, buttons, cutlery, and/or writing: Denies  Review of Systems  Constitutional:  Negative for fatigue.  HENT:  Negative for mouth sores, mouth dryness and nose dryness.   Eyes:  Negative for pain and dryness.  Respiratory:  Positive for shortness of breath. Negative for difficulty breathing.   Cardiovascular:  Negative for chest pain and palpitations.  Gastrointestinal:  Negative for blood in stool, constipation and diarrhea.  Endocrine: Negative for increased urination.  Genitourinary:  Negative for involuntary  urination.  Musculoskeletal:  Positive for joint pain, gait problem, joint pain and morning stiffness. Negative for joint swelling, myalgias, muscle weakness, muscle tenderness and myalgias.  Skin:  Negative for color change, rash and sensitivity to sunlight.  Allergic/Immunologic: Negative for susceptible to infections.  Neurological:  Negative for dizziness and headaches.  Hematological:  Negative for swollen glands.  Psychiatric/Behavioral:  Negative for depressed mood and sleep disturbance. The patient is not nervous/anxious.     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   Syphilis 09/21/2020   Dementia (HCC) 09/21/2020   Rheumatoid arthritis involving multiple sites with positive rheumatoid factor (HCC) 11/30/2016   High risk medications (not anticoagulants) long-term use 11/30/2016    Past Medical History:  Diagnosis Date   Arthritis    RA   Depression    Seizures (HCC) 06/21/2020   ETOH w/d related   Skin cancer of face 2024    Family History  Problem Relation Age of Onset   Alzheimer's disease Father    Healthy Son    Healthy Daughter    Rheum arthritis Daughter    Healthy Daughter    Past Surgical History:  Procedure Laterality Date   ANKLE FRACTURE SURGERY     Left   APPENDECTOMY     IR 3D INDEPENDENT WKST  09/01/2021  IR 3D INDEPENDENT WKST  12/14/2021   IR ANGIO INTRA EXTRACRAN SEL INTERNAL CAROTID BILAT MOD SED  09/01/2021   IR ANGIO INTRA EXTRACRAN SEL INTERNAL CAROTID UNI R MOD SED  12/14/2021   IR ANGIO VERTEBRAL SEL VERTEBRAL BILAT MOD SED  09/01/2021   IR ANGIOGRAM FOLLOW UP STUDY  12/14/2021   IR ANGIOGRAM FOLLOW UP STUDY  12/14/2021   IR ANGIOGRAM FOLLOW UP STUDY  12/14/2021   IR CT HEAD LTD  12/14/2021    IR TRANSCATH/EMBOLIZ  12/14/2021   IR US GUIDE VASC ACCESS RIGHT  09/01/2021   IR US GUIDE VASC ACCESS RIGHT  12/14/2021   MOHS SURGERY  2024   on face   RADIOLOGY WITH ANESTHESIA N/A 12/14/2021   Procedure: IR WITH ANESTHESIA EMBOLIZATION;  Surgeon: Baldemar Lenis, MD;  Location: [facility] OR;  Service: Radiology;  Laterality: N/A;   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   REPLACEMENT TOTAL KNEE Right 02/2023   REVERSE SHOULDER ARTHROPLASTY Left 05/13/2021   Procedure: REVERSE SHOULDER ARTHROPLASTY;  Surgeon: Beverely Low, MD;  Location: [facility] ORS;  Service: Orthopedics;  Laterality: Left;  with ISB   REVERSE SHOULDER ARTHROPLASTY Right 10/14/2021   Procedure: REVERSE SHOULDER ARTHROPLASTY;  Surgeon: Beverely Low, MD;  Location: [facility] OR[facility];  Service: Orthopedics;  Laterality: Right;  with ISB   Social History   Social History Narrative   Lives alone   Right Handed   Rarely drinks caffeine       Immunization History  Administered Date(s) Administered   Influenza, Seasonal, Injecte, Preservative Fre 05/07/2023   Influenza,inj,Quad PF,6+ Mos 07/04/2018, 08/03/2022   Tdap 05/25/2023     Objective: Vital Signs: BP (!) 148/81 (BP Location: Left Arm, Patient Position: Sitting, Cuff Size: Normal)   Pulse 80   Resp 16   Ht 5\' 6"  (1.676 m)   Wt 191 lb 9.6 oz (86.9 kg)   BMI 30.93 kg/m    Physical Exam Vitals and nursing note reviewed.  Constitutional:      Appearance: He is well-developed.  HENT:     Head: Normocephalic and atraumatic.  Eyes:     Conjunctiva/sclera: Conjunctivae normal.     Pupils: Pupils are equal, round, and reactive to light.  Cardiovascular:     Rate and Rhythm: Normal rate and regular rhythm.     Heart sounds: Normal heart sounds.  Pulmonary:     Effort: Pulmonary effort is normal.     Breath sounds: Normal breath sounds.  Abdominal:     General: Bowel sounds are normal.     Palpations: Abdomen is soft.  Musculoskeletal:     Cervical back: Normal  range of motion and neck supple.  Skin:    General: Skin is warm and dry.     Capillary Refill: Capillary refill takes less than 2 seconds.  Neurological:     Mental Status: He is alert and oriented to person, place, and time.  Psychiatric:        Behavior: Behavior normal.      Musculoskeletal Exam: C-spine has limited ROM with lateral rotation. thoracic spine and lumbar spine good ROM.  Shoulder replacements have limited internal rotation.  Elbow joints, wrist joints, MCPs, PIPs, and DIPs good ROM with no synovitis.  Complete fist formation bilaterally.  Hip joints have limited ROM.  Both knee replacements have limited extension.  Ankle joints have good ROM with some edema bilaterally.    CDAI Exam: CDAI Score: -- Patient Global: --; Provider Global: -- Swollen: --; Tender: --  Joint Exam 10/29/2023   No joint exam has been documented for this visit   There is currently no information documented on the homunculus. Go to the Rheumatology activity and complete the homunculus joint exam.  Investigation: No additional findings.  Imaging: No results found.  Recent Labs: Lab Results  Component Value Date   WBC 6.3 08/03/2023   HGB 14.6 08/03/2023   PLT 358 08/03/2023   NA 135 08/03/2023   K 5.0 08/03/2023   CL 94 (L) 08/03/2023   CO2 23 08/03/2023   GLUCOSE 112 (H) 08/03/2023   BUN 9 08/03/2023   CREATININE 0.73 (L) 08/03/2023   BILITOT 0.6 08/03/2023   ALKPHOS 81 08/03/2023   AST 30 08/03/2023   ALT 28 08/03/2023   PROT 7.3 08/03/2023   ALBUMIN 4.1 08/03/2023   CALCIUM 9.4 08/03/2023   GFRAA 112 02/25/2021   QFTBGOLD NEGATIVE 07/03/2017   QFTBGOLDPLUS NEGATIVE 10/02/2022    Speciality Comments: Prior therapy includes: Enbrel (pancytopenia) and methotrexate (inadequate response) Orencia-07/09/18 Enbrel-1/19-10/19  Procedures:  No procedures performed Allergies: Patient has no known allergies.   Assessment / Plan:     Visit Diagnoses: Rheumatoid arthritis  involving multiple sites with positive rheumatoid factor (HCC): He has no synovitis on examination today.  He has noticed a significant improvement in his symptoms since restarting Orencia 8 weeks ago.  He is currently on Orencia 125 mg sq injections once weekly.  He is tolerating Orencia without any side effects or injection site reactions. No medication changes will be made at this time.  He was advised to notify us if he develops signs or symptoms of a flare.  He will follow-up in the officein 3 months.   High risk medications (not anticoagulants) long-term use - Orencia 125 mg sq injections once weekly. Previous therapy: Enbrel, methotrexate. CBC and CMP updated on 08/03/23.Order for CBC and CMP released today. His next lab work will be due in June and every 3 months.   Lipid panel updated on 08/03/23.  TB gold negative on 10/02/22. Order for TB gold released today.  No recent or recurrent infections. Discussed the importance of holding orencia if he develops signs or symptoms of an infection and to resume once the infection has completely cleared.   - Plan: COMPLETE METABOLIC PANEL WITH GFR, CBC with Differential/Platelet, QuantiFERON-TB Gold Plus  Screening for tuberculosis -Order for TB gold released today.  Plan: QuantiFERON-TB Gold Plus  Status post replacement of both shoulder joints - Reverse RTSR by Dr. Ranell Patrick on 10/14/21.RLTSR May 13, 2022.  Limited internal rotation bilaterally.  Primary osteoarthritis of both hands: PIP and DIP thickening consistent with osteoarthritis of both hands.  No tenderness or synovitis noted.  S/P total knee arthroplasty, left - Under the care of Dr. Durwin Nora.  Slightly limited extension and mild warmth noted.  S/P total knee arthroplasty, right - Performed by Dr. Durwin Nora.  Completed PT. has been experiencing some residual stiffness and discomfort in his right knee replacement.  He has continued home exercises.  He has slightly limited extension on examination  today.  Primary osteoarthritis of both feet: He is not experiencing any increased discomfort in his feet at this time.  He has good range of motion of both ankle joints with no tenderness or joint swelling.  Some pedal edema noted bilaterally.  Other medical conditions are listed as follows:   S/P coil embolization of cerebral aneurysm  Latent syphilis  History of alcohol abuse  History of neutropenia: WBC WNL-6.3, absolute neutrophils WNL  on 08/03/23.     Orders: Orders Placed This Encounter  Procedures   COMPLETE METABOLIC PANEL WITH GFR   CBC with Differential/Platelet   QuantiFERON-TB Gold Plus   No orders of the defined types were placed in this encounter.  .  Follow-Up Instructions: Return in 3 months (on 01/29/2024) for Rheumatoid arthritis, Osteoarthritis.   Gearldine Bienenstock, PA-C  Note - This record has been created using Dragon software.  Chart creation errors have been sought, but may not always  have been located. Such creation errors do not reflect on  the standard of medical care.

## 2023-10-18 ENCOUNTER — Other Ambulatory Visit: Payer: Self-pay

## 2023-10-23 ENCOUNTER — Other Ambulatory Visit (HOSPITAL_COMMUNITY): Payer: Self-pay

## 2023-10-26 ENCOUNTER — Other Ambulatory Visit: Payer: Self-pay

## 2023-10-26 ENCOUNTER — Other Ambulatory Visit (HOSPITAL_COMMUNITY): Payer: Self-pay

## 2023-10-26 NOTE — Progress Notes (Signed)
 Specialty Pharmacy Refill Coordination Note  Daniel Griffin is a 63 y.o. male contacted today regarding refills of specialty medication(s) Abatacept (Orencia ClickJect)   Patient requested Delivery   Delivery date: 10/30/23   Verified address: [address]   [place] [address]   Medication will be filled on 10/29/23.

## 2023-10-29 ENCOUNTER — Encounter: Payer: Self-pay | Admitting: Physician Assistant

## 2023-10-29 ENCOUNTER — Ambulatory Visit: Payer: Medicare Other | Attending: Physician Assistant | Admitting: Physician Assistant

## 2023-10-29 VITALS — BP 148/81 | HR 80 | Resp 16 | Ht 66.0 in | Wt 191.6 lb

## 2023-10-29 DIAGNOSIS — Z96612 Presence of left artificial shoulder joint: Secondary | ICD-10-CM

## 2023-10-29 DIAGNOSIS — Z9889 Other specified postprocedural states: Secondary | ICD-10-CM

## 2023-10-29 DIAGNOSIS — Z96652 Presence of left artificial knee joint: Secondary | ICD-10-CM | POA: Diagnosis not present

## 2023-10-29 DIAGNOSIS — Z96611 Presence of right artificial shoulder joint: Secondary | ICD-10-CM | POA: Diagnosis not present

## 2023-10-29 DIAGNOSIS — M19071 Primary osteoarthritis, right ankle and foot: Secondary | ICD-10-CM

## 2023-10-29 DIAGNOSIS — M0579 Rheumatoid arthritis with rheumatoid factor of multiple sites without organ or systems involvement: Secondary | ICD-10-CM | POA: Diagnosis not present

## 2023-10-29 DIAGNOSIS — Z111 Encounter for screening for respiratory tuberculosis: Secondary | ICD-10-CM

## 2023-10-29 DIAGNOSIS — M19041 Primary osteoarthritis, right hand: Secondary | ICD-10-CM

## 2023-10-29 DIAGNOSIS — Z79899 Other long term (current) drug therapy: Secondary | ICD-10-CM | POA: Diagnosis not present

## 2023-10-29 DIAGNOSIS — F1011 Alcohol abuse, in remission: Secondary | ICD-10-CM

## 2023-10-29 DIAGNOSIS — Z96651 Presence of right artificial knee joint: Secondary | ICD-10-CM

## 2023-10-29 DIAGNOSIS — Z862 Personal history of diseases of the blood and blood-forming organs and certain disorders involving the immune mechanism: Secondary | ICD-10-CM | POA: Diagnosis not present

## 2023-10-29 DIAGNOSIS — A530 Latent syphilis, unspecified as early or late: Secondary | ICD-10-CM

## 2023-10-29 DIAGNOSIS — M19042 Primary osteoarthritis, left hand: Secondary | ICD-10-CM

## 2023-10-29 DIAGNOSIS — M19072 Primary osteoarthritis, left ankle and foot: Secondary | ICD-10-CM

## 2023-10-29 NOTE — Patient Instructions (Signed)

## 2023-10-30 NOTE — Progress Notes (Signed)
 RBC count is borderline low-4.14. rest of CBC WNL.  Creatinine is borderline low. Rest of CMP WNL

## 2023-10-31 LAB — CBC WITH DIFFERENTIAL/PLATELET
Absolute Lymphocytes: 1484 {cells}/uL (ref 850–3900)
Absolute Monocytes: 806 {cells}/uL (ref 200–950)
Basophils Absolute: 39 {cells}/uL (ref 0–200)
Basophils Relative: 0.7 %
Eosinophils Absolute: 168 {cells}/uL (ref 15–500)
Eosinophils Relative: 3 %
HCT: 39.5 % (ref 38.5–50.0)
Hemoglobin: 13.5 g/dL (ref 13.2–17.1)
MCH: 32.6 pg (ref 27.0–33.0)
MCHC: 34.2 g/dL (ref 32.0–36.0)
MCV: 95.4 fL (ref 80.0–100.0)
MPV: 9.1 fL (ref 7.5–12.5)
Monocytes Relative: 14.4 %
Neutro Abs: 3102 {cells}/uL (ref 1500–7800)
Neutrophils Relative %: 55.4 %
Platelets: 301 10*3/uL (ref 140–400)
RBC: 4.14 10*6/uL — ABNORMAL LOW (ref 4.20–5.80)
RDW: 12.2 % (ref 11.0–15.0)
Total Lymphocyte: 26.5 %
WBC: 5.6 10*3/uL (ref 3.8–10.8)

## 2023-10-31 LAB — COMPLETE METABOLIC PANEL WITH GFR
AG Ratio: 1.5 (calc) (ref 1.0–2.5)
ALT: 16 U/L (ref 9–46)
AST: 22 U/L (ref 10–35)
Albumin: 4.1 g/dL (ref 3.6–5.1)
Alkaline phosphatase (APISO): 61 U/L (ref 35–144)
BUN/Creatinine Ratio: 13 (calc) (ref 6–22)
BUN: 9 mg/dL (ref 7–25)
CO2: 29 mmol/L (ref 20–32)
Calcium: 9.3 mg/dL (ref 8.6–10.3)
Chloride: 99 mmol/L (ref 98–110)
Creat: 0.67 mg/dL — ABNORMAL LOW (ref 0.70–1.35)
Globulin: 2.7 g/dL (ref 1.9–3.7)
Glucose, Bld: 102 mg/dL — ABNORMAL HIGH (ref 65–99)
Potassium: 5.2 mmol/L (ref 3.5–5.3)
Sodium: 136 mmol/L (ref 135–146)
Total Bilirubin: 0.7 mg/dL (ref 0.2–1.2)
Total Protein: 6.8 g/dL (ref 6.1–8.1)
eGFR: 105 mL/min/{1.73_m2} (ref >=60)

## 2023-10-31 LAB — QUANTIFERON-TB GOLD PLUS
Mitogen-NIL: 1.89 [IU]/mL
NIL: 0.05 [IU]/mL
QuantiFERON-TB Gold Plus: NEGATIVE
TB1-NIL: <0 [IU]/mL
TB2-NIL: <0 [IU]/mL

## 2023-10-31 NOTE — Progress Notes (Signed)
 TB gold negative

## 2023-11-01 ENCOUNTER — Other Ambulatory Visit (HOSPITAL_COMMUNITY): Payer: Self-pay

## 2023-11-19 ENCOUNTER — Other Ambulatory Visit (HOSPITAL_COMMUNITY): Payer: Self-pay

## 2023-11-19 ENCOUNTER — Other Ambulatory Visit: Payer: Self-pay

## 2023-11-19 ENCOUNTER — Other Ambulatory Visit: Payer: Self-pay | Admitting: Physician Assistant

## 2023-11-19 DIAGNOSIS — Z79899 Other long term (current) drug therapy: Secondary | ICD-10-CM

## 2023-11-19 DIAGNOSIS — M0579 Rheumatoid arthritis with rheumatoid factor of multiple sites without organ or systems involvement: Secondary | ICD-10-CM

## 2023-11-19 MED ORDER — ORENCIA CLICKJECT 125 MG/ML ~~LOC~~ SOAJ
125.0000 mg | SUBCUTANEOUS | 0 refills | Status: DC
  Filled 2023-11-19: qty 4, 28d supply, fill #0
  Filled 2023-12-18: qty 4, 28d supply, fill #1
  Filled 2024-01-21: qty 4, 28d supply, fill #2

## 2023-11-19 NOTE — Progress Notes (Signed)
 Specialty Pharmacy Refill Coordination Note  Daniel Griffin is a 63 y.o. male contacted today regarding refills of specialty medication(s) Abatacept (Orencia ClickJect)   Patient requested Delivery   Delivery date: 11/22/23   Verified address: [address]   [place] [address]   Medication will be filled on 11/21/23.   This fill date is pending response to refill request from provider. Patient is aware and if they have not received fill by intended date, they must follow up with pharmacy.

## 2023-11-19 NOTE — Telephone Encounter (Signed)
 Last Fill: 08/31/2023  Labs: 10/29/2023 RBC count is borderline low-4.14. rest of CBC WNL.  Creatinine is borderline low. Rest of CMP WNL   TB Gold: 10/29/2023 Neg    Next Visit: 01/29/2024  Last Visit: 10/29/2023  [id number] arthritis involving multiple sites with positive rheumatoid factor   Current Dose per office note 10/29/2023: Orencia 125 mg sq injections once weekly.   Okay to refill Orencia?

## 2023-12-18 ENCOUNTER — Other Ambulatory Visit: Payer: Self-pay

## 2023-12-18 ENCOUNTER — Other Ambulatory Visit (HOSPITAL_COMMUNITY): Payer: Self-pay

## 2023-12-18 NOTE — Progress Notes (Signed)
 Specialty Pharmacy Refill Coordination Note  Daniel Griffin is a 63 y.o. male contacted today regarding refills of specialty medication(s) Abatacept  (Orencia  ClickJect)   Patient requested Delivery   Delivery date: 12/19/23   Verified address: [address]   [place] [facility] [place] [address]   Medication will be filled on 12/18/23.

## 2023-12-25 DIAGNOSIS — C44612 Basal cell carcinoma of skin of right upper limb, including shoulder: Secondary | ICD-10-CM | POA: Diagnosis not present

## 2023-12-25 DIAGNOSIS — Z08 Encounter for follow-up examination after completed treatment for malignant neoplasm: Secondary | ICD-10-CM | POA: Diagnosis not present

## 2023-12-25 DIAGNOSIS — Z85828 Personal history of other malignant neoplasm of skin: Secondary | ICD-10-CM | POA: Diagnosis not present

## 2023-12-26 ENCOUNTER — Other Ambulatory Visit (HOSPITAL_COMMUNITY): Payer: Self-pay

## 2024-01-15 ENCOUNTER — Other Ambulatory Visit (HOSPITAL_COMMUNITY): Payer: Self-pay

## 2024-01-15 NOTE — Progress Notes (Unsigned)
 Office Visit Note  Patient: Daniel Griffin             Date of Birth: [birth year]           MRN: [id number]             PCP: Delfina Feller, FNP Referring: Delfina Feller, * Visit Date: 01/29/2024 Occupation: @GUAROCC @  Subjective:  Medication monitoring   History of Present Illness: [name] is a 63 y.o. male with history of seropositive rheumatoid arthritis and osteoarthritis.  Patient remains on  Orencia  125 mg sq injections once weekly.  He is tolerating Orencia  without any side effects or injection site reactions.  He denies missing any doses recently.  He denies any recent or recurrent infections.  He has not had any signs or symptoms of a rheumatoid arthritis flare.  His morning stiffness has been lasting for about 1 hour daily but as he loosens up for the day he feels much better.  He denies any joint swelling at this time.  He denies any upcoming surgical procedures.   Activities of Daily Living:  Patient reports morning stiffness for 1 hour.   Patient Denies nocturnal pain.  Difficulty dressing/grooming: Reports Difficulty climbing stairs: Denies Difficulty getting out of chair: Reports Difficulty using hands for taps, buttons, cutlery, and/or writing: Denies  Review of Systems  Constitutional:  Negative for fatigue.  HENT:  Negative for mouth sores and mouth dryness.   Eyes:  Negative for dryness.  Respiratory:  Negative for shortness of breath.   Cardiovascular:  Negative for chest pain and palpitations.  Gastrointestinal:  Negative for blood in stool, constipation and diarrhea.  Endocrine: Negative for increased urination.  Genitourinary:  Negative for involuntary urination.  Musculoskeletal:  Positive for joint pain, gait problem, joint pain, joint swelling and morning stiffness. Negative for myalgias, muscle weakness, muscle tenderness and myalgias.  Skin:  Negative for color change, rash, hair loss and sensitivity to sunlight.   Allergic/Immunologic: Negative for susceptible to infections.  Neurological:  Positive for light-headedness. Negative for dizziness and headaches.  Hematological:  Negative for swollen glands.  Psychiatric/Behavioral:  Positive for sleep disturbance. Negative for depressed mood. The patient is not nervous/anxious.     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   Syphilis 09/21/2020   Dementia (HCC) 09/21/2020   Rheumatoid arthritis involving multiple sites with positive rheumatoid factor (HCC) 11/30/2016   High risk medications (not anticoagulants) long-term use 11/30/2016    Past Medical History:  Diagnosis Date   Arthritis    RA   Depression    Seizures (HCC) 06/21/2020   ETOH w/d related   Skin cancer of face 2024    Family History  Problem Relation Age of Onset   Alzheimer's disease Father    Healthy Son    Healthy Daughter    Rheum arthritis Daughter    Healthy Daughter    Past Surgical History:  Procedure Laterality Date   ANKLE FRACTURE SURGERY     Left   APPENDECTOMY     IR 3D INDEPENDENT WKST  09/01/2021   IR 3D INDEPENDENT WKST  12/14/2021   IR ANGIO INTRA EXTRACRAN SEL INTERNAL CAROTID BILAT MOD SED  09/01/2021   IR ANGIO INTRA EXTRACRAN SEL INTERNAL CAROTID UNI R  MOD SED  12/14/2021   IR ANGIO VERTEBRAL SEL VERTEBRAL BILAT MOD SED  09/01/2021   IR ANGIOGRAM FOLLOW UP STUDY  12/14/2021   IR ANGIOGRAM FOLLOW UP STUDY  12/14/2021   IR ANGIOGRAM FOLLOW UP STUDY  12/14/2021   IR CT HEAD LTD  12/14/2021   IR TRANSCATH/EMBOLIZ  12/14/2021   IR US  GUIDE VASC ACCESS RIGHT  09/01/2021   IR US  GUIDE VASC ACCESS RIGHT  12/14/2021   MOHS SURGERY  2024   on face   RADIOLOGY WITH ANESTHESIA N/A 12/14/2021   Procedure: IR  WITH ANESTHESIA EMBOLIZATION;  Surgeon: de Macedo Rodrigues, Katyucia, MD;  Location: [facility] OR;  Service: Radiology;  Laterality: N/A;   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   REPLACEMENT TOTAL KNEE Right 02/2023   REVERSE SHOULDER ARTHROPLASTY Left 05/13/2021   Procedure: REVERSE SHOULDER ARTHROPLASTY;  Surgeon: Winston Hawking, MD;  Location: [facility] OR[facility];  Service: Orthopedics;  Laterality: Left;  with ISB   REVERSE SHOULDER ARTHROPLASTY Right 10/14/2021   Procedure: REVERSE SHOULDER ARTHROPLASTY;  Surgeon: Winston Hawking, MD;  Location: [facility] OR[facility];  Service: Orthopedics;  Laterality: Right;  with ISB   Social History   Social History Narrative   Lives alone   Right Handed   Rarely drinks caffeine       Immunization History  Administered Date(s) Administered   Influenza, Seasonal, Injecte, Preservative Fre 05/07/2023   Influenza,inj,Quad PF,6+ Mos 07/04/2018, 08/03/2022   Tdap 05/25/2023     Objective: Vital Signs: BP 136/80 (BP Location: Left Arm, Patient Position: Sitting, Cuff Size: Normal)   Pulse 74   Resp 15   Ht 5\' 7"  (1.702 m)   Wt 191 lb 3.2 oz (86.7 kg)   BMI 29.95 kg/m    Physical Exam Vitals and nursing note reviewed.  Constitutional:      Appearance: He is well-developed.  HENT:     Head: Normocephalic and atraumatic.  Eyes:     Conjunctiva/sclera: Conjunctivae normal.     Pupils: Pupils are equal, round, and reactive to light.  Cardiovascular:     Rate and Rhythm: Normal rate and regular rhythm.     Heart sounds: Normal heart sounds.  Pulmonary:     Effort: Pulmonary effort is normal.     Breath sounds: Normal breath sounds.  Abdominal:     General: Bowel sounds are normal.     Palpations: Abdomen is soft.  Musculoskeletal:     Cervical back: Normal range of motion and neck supple.  Skin:    General: Skin is warm and dry.     Capillary Refill: Capillary refill takes less than 2 seconds.  Neurological:     Mental Status: He is alert and oriented to person,  place, and time.  Psychiatric:        Behavior: Behavior normal.      Musculoskeletal Exam: C-spine has slightly limited ROM with lateral rotation.  Postural thoracic kyphosis.  Bilateral shoulder replacements have good range of motion with no discomfort.  Elbow joints, wrist joints, MCPs, PIPs, DIPs have good range of motion with no synovitis.  PIP and DIP thickening consistent with osteoarthritis of both hands.  Hip joints have good range of motion with some discomfort in the right hip.  Limited extension of both knee replacements.  Ankle joints have good range of motion with no tenderness or joint swelling.  CDAI Exam: CDAI Score: -- Patient Global: --; Provider Global: -- Swollen: --; Tender: -- Joint Exam 01/29/2024   No joint exam has  been documented for this visit   There is currently no information documented on the homunculus. Go to the Rheumatology activity and complete the homunculus joint exam.  Investigation: No additional findings.  Imaging: No results found.  Recent Labs: Lab Results  Component Value Date   WBC 5.6 10/29/2023   HGB 13.5 10/29/2023   PLT 301 10/29/2023   NA 136 10/29/2023   K 5.2 10/29/2023   CL 99 10/29/2023   CO2 29 10/29/2023   GLUCOSE 102 (H) 10/29/2023   BUN 9 10/29/2023   CREATININE 0.67 (L) 10/29/2023   BILITOT 0.7 10/29/2023   ALKPHOS 81 08/03/2023   AST 22 10/29/2023   ALT 16 10/29/2023   PROT 6.8 10/29/2023   ALBUMIN 4.1 08/03/2023   CALCIUM 9.3 10/29/2023   GFRAA 112 02/25/2021   QFTBGOLD NEGATIVE 07/03/2017   QFTBGOLDPLUS NEGATIVE 10/29/2023    Speciality Comments: Prior therapy includes: Enbrel  (pancytopenia) and methotrexate (inadequate response) Orencia -07/09/18 Enbrel -1/19-10/19  Procedures:  No procedures performed Allergies: Patient has no known allergies.   Assessment / Plan:     Visit Diagnoses: Rheumatoid arthritis involving multiple sites with positive rheumatoid factor (HCC) -He has no synovitis on  examination today.  He has not had any signs or symptoms of a rheumatoid arthritis flare.  He has clinically been doing well on Orencia  125 mg subcutaneous injections once weekly.  He is tolerating Orencia  without any side effects and has not had any recent gaps in therapy.  No recent or recurrent infections.  No medication changes will be made at this time.  He was advised to notify us  if he develops signs or symptoms of a flare.  He will follow up in the office in 3 months or sooner if needed. Plan: Lipid panel  High risk medications (not anticoagulants) long-term use - Orencia  125 mg sq injections once weekly. Previous therapy: Enbrel , methotrexate.  CBC and CMP updated on 10/29/23. Orders for CBC and CMP released today.   Lipid panel 08/03/23.  TB gold negative on 10/29/23.   No recent or recurrent infections.  Discussed the importance of holding orencia  if he develops signs or symptoms of an infection and to resume once the infection has completely cleared.   - Plan: CBC with Differential/Platelet, Comprehensive metabolic panel with GFR, Lipid panel  History of neutropenia - WBC count WNL on 10/29/23 . CBC with diff updated today.  Plan: CBC with Differential/Platelet  Lipid screening -Lipid panel order released today.   Plan: Lipid panel  Status post replacement of both shoulder joints: Doing well.  Good range of motion with no discomfort at this time.  No tenderness upon palpation.  Primary osteoarthritis of both hands: He has PIP and DIP thickening consistent with osteoarthritis of both hands.  No synovitis noted on examination today.  S/P total knee arthroplasty, left - Under the care of Dr. Kermit Ped.  Slightly limited extension on examination today.  No effusion noted.  S/P total knee arthroplasty, right - Performed by Dr. Kermit Ped.  Completed PT. limited extension on exam.  No effusion noted.  Primary osteoarthritis of both feet: Good range of motion of both ankle joints with no joint  tenderness.  Some pedal edema noted bilateral LE.  Other medical conditions are listed as follows:   S/P coil embolization of cerebral aneurysm  Latent syphilis  History of alcohol abuse  Orders: Orders Placed This Encounter  Procedures   CBC with Differential/Platelet   Comprehensive metabolic panel with GFR   Lipid panel  No orders of the defined types were placed in this encounter.   Follow-Up Instructions: Return in about 3 months (around 04/30/2024) for Rheumatoid arthritis.   Romayne Clubs, PA-C  Note - This record has been created using Dragon software.  Chart creation errors have been sought, but may not always  have been located. Such creation errors do not reflect on  the standard of medical care.

## 2024-01-18 ENCOUNTER — Other Ambulatory Visit (HOSPITAL_COMMUNITY): Payer: Self-pay

## 2024-01-21 ENCOUNTER — Other Ambulatory Visit: Payer: Self-pay

## 2024-01-21 NOTE — Progress Notes (Signed)
 Specialty Pharmacy Refill Coordination Note  LOCKLAN CANOY is a 63 y.o. male contacted today regarding refills of specialty medication(s) Abatacept  (Orencia  ClickJect)   Spoke with patient's sister - [name]  Patient requested Delivery   Delivery date: 01/25/24   Verified address: [address]   [place] [address]   Medication will be filled on 06.05.25.

## 2024-01-24 ENCOUNTER — Other Ambulatory Visit: Payer: Self-pay | Admitting: Nurse Practitioner

## 2024-01-24 DIAGNOSIS — F322 Major depressive disorder, single episode, severe without psychotic features: Secondary | ICD-10-CM

## 2024-01-29 ENCOUNTER — Ambulatory Visit: Attending: Physician Assistant | Admitting: Physician Assistant

## 2024-01-29 ENCOUNTER — Encounter: Payer: Self-pay | Admitting: Physician Assistant

## 2024-01-29 VITALS — BP 136/80 | HR 74 | Resp 15 | Ht 67.0 in | Wt 191.2 lb

## 2024-01-29 DIAGNOSIS — Z96652 Presence of left artificial knee joint: Secondary | ICD-10-CM | POA: Diagnosis not present

## 2024-01-29 DIAGNOSIS — M0579 Rheumatoid arthritis with rheumatoid factor of multiple sites without organ or systems involvement: Secondary | ICD-10-CM | POA: Diagnosis not present

## 2024-01-29 DIAGNOSIS — M19042 Primary osteoarthritis, left hand: Secondary | ICD-10-CM

## 2024-01-29 DIAGNOSIS — Z9889 Other specified postprocedural states: Secondary | ICD-10-CM

## 2024-01-29 DIAGNOSIS — M19041 Primary osteoarthritis, right hand: Secondary | ICD-10-CM | POA: Diagnosis not present

## 2024-01-29 DIAGNOSIS — M19072 Primary osteoarthritis, left ankle and foot: Secondary | ICD-10-CM

## 2024-01-29 DIAGNOSIS — Z13220 Encounter for screening for lipoid disorders: Secondary | ICD-10-CM | POA: Diagnosis not present

## 2024-01-29 DIAGNOSIS — Z96651 Presence of right artificial knee joint: Secondary | ICD-10-CM | POA: Diagnosis not present

## 2024-01-29 DIAGNOSIS — Z862 Personal history of diseases of the blood and blood-forming organs and certain disorders involving the immune mechanism: Secondary | ICD-10-CM | POA: Diagnosis not present

## 2024-01-29 DIAGNOSIS — F1011 Alcohol abuse, in remission: Secondary | ICD-10-CM

## 2024-01-29 DIAGNOSIS — Z79899 Other long term (current) drug therapy: Secondary | ICD-10-CM

## 2024-01-29 DIAGNOSIS — Z96612 Presence of left artificial shoulder joint: Secondary | ICD-10-CM

## 2024-01-29 DIAGNOSIS — Z96611 Presence of right artificial shoulder joint: Secondary | ICD-10-CM | POA: Diagnosis not present

## 2024-01-29 DIAGNOSIS — A530 Latent syphilis, unspecified as early or late: Secondary | ICD-10-CM

## 2024-01-29 DIAGNOSIS — M19071 Primary osteoarthritis, right ankle and foot: Secondary | ICD-10-CM

## 2024-01-29 LAB — LIPID PANEL
Cholesterol: 146 mg/dL (ref <200)
HDL: 83 mg/dL (ref >=40)
LDL Cholesterol (Calc): 50 mg/dL
Non-HDL Cholesterol (Calc): 63 mg/dL (ref <130)
Total CHOL/HDL Ratio: 1.8 (calc) (ref <5.0)
Triglycerides: 51 mg/dL (ref <150)

## 2024-01-29 LAB — CBC WITH DIFFERENTIAL/PLATELET
Absolute Lymphocytes: 1411 {cells}/uL (ref 850–3900)
Absolute Monocytes: 706 {cells}/uL (ref 200–950)
Basophils Absolute: 50 {cells}/uL (ref 0–200)
Basophils Relative: 0.8 %
Eosinophils Absolute: 113 {cells}/uL (ref 15–500)
Eosinophils Relative: 1.8 %
HCT: 40.3 % (ref 38.5–50.0)
Hemoglobin: 13.1 g/dL — ABNORMAL LOW (ref 13.2–17.1)
MCH: 32 pg (ref 27.0–33.0)
MCHC: 32.5 g/dL (ref 32.0–36.0)
MCV: 98.5 fL (ref 80.0–100.0)
MPV: 8.8 fL (ref 7.5–12.5)
Monocytes Relative: 11.2 %
Neutro Abs: 4019 {cells}/uL (ref 1500–7800)
Neutrophils Relative %: 63.8 %
Platelets: 308 10*3/uL (ref 140–400)
RBC: 4.09 10*6/uL — ABNORMAL LOW (ref 4.20–5.80)
RDW: 12 % (ref 11.0–15.0)
Total Lymphocyte: 22.4 %
WBC: 6.3 10*3/uL (ref 3.8–10.8)

## 2024-01-29 LAB — COMPREHENSIVE METABOLIC PANEL WITH GFR
AG Ratio: 1.6 (calc) (ref 1.0–2.5)
ALT: 14 U/L (ref 9–46)
AST: 18 U/L (ref 10–35)
Albumin: 4.1 g/dL (ref 3.6–5.1)
Alkaline phosphatase (APISO): 61 U/L (ref 35–144)
BUN: 14 mg/dL (ref 7–25)
CO2: 31 mmol/L (ref 20–32)
Calcium: 8.9 mg/dL (ref 8.6–10.3)
Chloride: 97 mmol/L — ABNORMAL LOW (ref 98–110)
Creat: 0.79 mg/dL (ref 0.70–1.35)
Globulin: 2.6 g/dL (ref 1.9–3.7)
Glucose, Bld: 103 mg/dL — ABNORMAL HIGH (ref 65–99)
Potassium: 4.9 mmol/L (ref 3.5–5.3)
Sodium: 134 mmol/L — ABNORMAL LOW (ref 135–146)
Total Bilirubin: 0.4 mg/dL (ref 0.2–1.2)
Total Protein: 6.7 g/dL (ref 6.1–8.1)
eGFR: 100 mL/min/{1.73_m2} (ref >=60)

## 2024-01-30 ENCOUNTER — Ambulatory Visit: Payer: Self-pay | Admitting: Physician Assistant

## 2024-01-30 NOTE — Progress Notes (Signed)
 Lipid panel WNL  RBC count and hemoglobin are borderline low. Rest of CBC WNL  CMP stable.

## 2024-02-01 ENCOUNTER — Encounter: Payer: Self-pay | Admitting: Nurse Practitioner

## 2024-02-01 ENCOUNTER — Ambulatory Visit (INDEPENDENT_AMBULATORY_CARE_PROVIDER_SITE_OTHER): Payer: Medicare Other | Admitting: Nurse Practitioner

## 2024-02-01 VITALS — BP 145/85 | HR 84 | Temp 97.1°F | Ht 67.0 in | Wt 188.0 lb

## 2024-02-01 DIAGNOSIS — F03B4 Unspecified dementia, moderate, with anxiety: Secondary | ICD-10-CM | POA: Diagnosis not present

## 2024-02-01 DIAGNOSIS — I671 Cerebral aneurysm, nonruptured: Secondary | ICD-10-CM | POA: Diagnosis not present

## 2024-02-01 DIAGNOSIS — I10 Essential (primary) hypertension: Secondary | ICD-10-CM

## 2024-02-01 DIAGNOSIS — M0579 Rheumatoid arthritis with rheumatoid factor of multiple sites without organ or systems involvement: Secondary | ICD-10-CM

## 2024-02-01 DIAGNOSIS — Z6830 Body mass index (BMI) 30.0-30.9, adult: Secondary | ICD-10-CM

## 2024-02-01 DIAGNOSIS — F322 Major depressive disorder, single episode, severe without psychotic features: Secondary | ICD-10-CM

## 2024-02-01 MED ORDER — ESCITALOPRAM OXALATE 10 MG PO TABS: 10.0000 mg | ORAL_TABLET | Freq: Every day | ORAL | 1 refills | Status: DC

## 2024-02-01 MED ORDER — LISINOPRIL 20 MG PO TABS: 20.0000 mg | ORAL_TABLET | Freq: Every day | ORAL | 1 refills | Status: DC

## 2024-02-01 MED ORDER — BUPROPION HCL ER (XL) 150 MG PO TB24: 150.0000 mg | ORAL_TABLET | Freq: Every day | ORAL | 1 refills | Status: DC

## 2024-02-01 NOTE — Progress Notes (Signed)
 Subjective:    Patient ID: Daniel Griffin, male    DOB: 04/26/1961, 63 y.o.   MRN: [id number]   Chief Complaint: medical management of chronic issues     HPI:   Daniel Griffin is a 63 y.o. who identifies as a male who was assigned male at birth.   Social history: Lives with: by himself Work history: disability   Comes in today for follow up of the following chronic medical issues:  1. Moderate dementia with anxiety, unspecified dementia type [facility]) Patient is on lexapro  and seems to be doing ok. Not able t o work because he has trouble remembering things. Doe snot go far from his home I he is alone.    08/03/2023   10:26 AM 05/07/2023   12:26 PM 02/02/2023   10:28 AM  Depression screen PHQ 2/9  Decreased Interest 0 0 0  Down, Depressed, Hopeless 0 0 0  PHQ - 2 Score 0 0 0  Altered sleeping 0 0 0  Tired, decreased energy 0 0 0  Change in appetite 0 0 0  Feeling bad or failure about yourself  0 0 0  Trouble concentrating 0 0 0  Moving slowly or fidgety/restless 0 0 0  Suicidal thoughts 0 0 0  PHQ-9 Score 0 0 0  Difficult doing work/chores Not difficult at all Not difficult at all Not difficult at all     2. Rheumatoid arthritis involving multiple sites with positive rheumatoid factor (HCC) Has had multiple surgeries for joints lately. Still has daily pain. Sees rheumatology every 6 months. No change to plan of care at last visit on 01/29/24  3. Body mass index 30.0-30.9, adult No recent weight changes Wt Readings from Last 3 Encounters:  01/29/24 191 lb 3.2 oz (86.7 kg)  10/29/23 191 lb 9.6 oz (86.9 kg)  08/03/23 186 lb (84.4 kg)   BMI Readings from Last 3 Encounters:  01/29/24 29.95 kg/m  10/29/23 30.93 kg/m  08/03/23 30.02 kg/m     4. Brain aneurysm Has some memory issues and some thought process issues but is doiing ok. Denies any headaches  5. depression    02/01/2024   10:31 AM 08/03/2023   10:26 AM 05/07/2023   12:26 PM  Depression screen  PHQ 2/9  Decreased Interest 0 0 0  Down, Depressed, Hopeless 0 0 0  PHQ - 2 Score 0 0 0  Altered sleeping  0 0  Tired, decreased energy  0 0  Change in appetite  0 0  Feeling bad or failure about yourself   0 0  Trouble concentrating  0 0  Moving slowly or fidgety/restless  0 0  Suicidal thoughts  0 0  PHQ-9 Score  0 0  Difficult doing work/chores  Not difficult at all Not difficult at all       New complaints: None today  No Known Allergies Outpatient Encounter Medications as of 02/01/2024  Medication Sig   Abatacept  (ORENCIA  CLICKJECT) 125 MG/ML SOAJ INJECT 125 MG INTO THE SKIN ONCE A WEEK.   buPROPion  (WELLBUTRIN  XL) 150 MG 24 hr tablet Take 1 tablet (150 mg total) by mouth daily.   escitalopram  (LEXAPRO ) 10 MG tablet TAKE 1 TABLET BY MOUTH EVERY DAY   lisinopril  (ZESTRIL ) 20 MG tablet Take 1 tablet (20 mg total) by mouth daily.   predniSONE  (DELTASONE ) 5 MG tablet Take 4 tabs po x 4 days, 3  tabs po x 4 days, 2  tabs po x 4 days, 1  tab po x 4 days (Patient not taking: Reported on 01/29/2024)   No facility-administered encounter medications on file as of 02/01/2024.    Past Surgical History:  Procedure Laterality Date   ANKLE FRACTURE SURGERY     Left   APPENDECTOMY     IR 3D INDEPENDENT [facility]  09/01/2021   IR 3D INDEPENDENT [facility]  12/14/2021   IR ANGIO INTRA EXTRACRAN SEL INTERNAL CAROTID BILAT MOD SED  09/01/2021   IR ANGIO INTRA EXTRACRAN SEL INTERNAL CAROTID UNI R MOD SED  12/14/2021   IR ANGIO VERTEBRAL SEL VERTEBRAL BILAT MOD SED  09/01/2021   IR ANGIOGRAM FOLLOW UP STUDY  12/14/2021   IR ANGIOGRAM FOLLOW UP STUDY  12/14/2021   IR ANGIOGRAM FOLLOW UP STUDY  12/14/2021   IR CT HEAD LTD  12/14/2021   IR TRANSCATH/EMBOLIZ  12/14/2021   IR US  GUIDE VASC ACCESS RIGHT  09/01/2021   IR US  GUIDE VASC ACCESS RIGHT  12/14/2021   MOHS SURGERY  2024   on face   RADIOLOGY WITH ANESTHESIA N/A 12/14/2021   Procedure: IR WITH ANESTHESIA EMBOLIZATION;  Surgeon: de Macedo  Rodrigues, Katyucia, MD;  Location: [facility];  Service: Radiology;  Laterality: N/A;   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   REPLACEMENT TOTAL KNEE Right 02/2023   REVERSE SHOULDER ARTHROPLASTY Left 05/13/2021   Procedure: REVERSE SHOULDER ARTHROPLASTY;  Surgeon: Winston Hawking, MD;  Location: [facility];  Service: Orthopedics;  Laterality: Left;  with ISB   REVERSE SHOULDER ARTHROPLASTY Right 10/14/2021   Procedure: REVERSE SHOULDER ARTHROPLASTY;  Surgeon: Winston Hawking, MD;  Location: [facility];  Service: Orthopedics;  Laterality: Right;  with ISB    Family History  Problem Relation Age of Onset   Alzheimer's disease Father    Healthy Son    Healthy Daughter    Rheum arthritis Daughter    Healthy Daughter       Controlled substance contract: n/a     Review of Systems  Constitutional:  Negative for diaphoresis.  Eyes:  Negative for pain.  Respiratory:  Negative for shortness of breath.   Cardiovascular:  Negative for chest pain, palpitations and leg swelling.  Gastrointestinal:  Negative for abdominal pain.  Endocrine: Negative for polydipsia.  Musculoskeletal:  Positive for arthralgias.  Skin:  Negative for rash.  Neurological:  Negative for dizziness, weakness and headaches.  Hematological:  Does not bruise/bleed easily.  All other systems reviewed and are negative.      Objective:   Physical Exam Vitals and nursing note reviewed.  Constitutional:      Appearance: Normal appearance. He is well-developed.  HENT:     Head: Normocephalic.     Nose: Nose normal.     Mouth/Throat:     Mouth: Mucous membranes are moist.     Pharynx: Oropharynx is clear.   Eyes:     Pupils: Pupils are equal, round, and reactive to light.   Neck:     Thyroid : No thyroid  mass or thyromegaly.     Vascular: No carotid bruit or JVD.     Trachea: Phonation normal.   Cardiovascular:     Rate and Rhythm: Normal rate and regular rhythm.  Pulmonary:     Effort: Pulmonary effort is normal. No  respiratory distress.     Breath sounds: Normal breath sounds.  Abdominal:     General: Bowel sounds are normal.     Palpations: Abdomen is soft.     Tenderness: There is no abdominal tenderness.   Musculoskeletal:  General: Normal range of motion.     Cervical back: Normal range of motion and neck supple.  Lymphadenopathy:     Cervical: No cervical adenopathy.   Skin:    General: Skin is warm and dry.   Neurological:     Mental Status: He is alert and oriented to person, place, and time.   Psychiatric:        Behavior: Behavior normal.        Thought Content: Thought content normal.        Judgment: Judgment normal.     BP (!) 145/85   Pulse 84   Temp (!) 97.1 F (36.2 C) (Temporal)   Ht 5' 7 (1.702 m)   Wt 188 lb (85.3 kg)   SpO2 94%   BMI 29.44 kg/m         Assessment & Plan:  Daniel Griffin comes in today with chief complaint of medical management of chronic issues    Diagnosis and orders addressed:  1. Moderate dementia with anxiety, unspecified dementia type (HCC) (Primary) [place] daily  2. Rheumatoid arthritis involving multiple sites with positive rheumatoid factor (HCC) Keep follow up with rheumatology  3. Body mass index 30.0-30.9, adult  4. Brain aneurysm Report amy headaches  5. Depression, major, single episode, severe (HCC) Stress management - escitalopram  (LEXAPRO ) 10 MG tablet; Take 1 tablet (10 mg total) by mouth daily.  Dispense: 90 tablet; Refill: 1  6. Primary hypertension Low sodium diet - lisinopril  (ZESTRIL ) 20 MG tablet; Take 1 tablet (20 mg total) by mouth daily.  Dispense: 90 tablet; Refill: 1   Labs reviewed at appointment Health Maintenance reviewed Diet and exercise encouraged  Follow up plan: 6 months   Mary-Margaret Gaylyn Keas, FNP

## 2024-02-01 NOTE — Patient Instructions (Signed)
 Exercising to Stay Healthy To become healthy and stay healthy, it is recommended that you do moderate-intensity and vigorous-intensity exercise. You can tell that you are exercising at a moderate intensity if your heart starts beating faster and you start breathing faster but can still hold a conversation. You can tell that you are exercising at a vigorous intensity if you are breathing much harder and faster and cannot hold a conversation while exercising. How can exercise benefit me? Exercising regularly is important. It has many health benefits, such as: Improving overall fitness, flexibility, and endurance. Increasing bone density. Helping with weight control. Decreasing body fat. Increasing muscle strength and endurance. Reducing stress and tension, anxiety, depression, or anger. Improving overall health. What guidelines should I follow while exercising? Before you start a new exercise program, talk with your health care provider. Do not exercise so much that you hurt yourself, feel dizzy, or get very short of breath. Wear comfortable clothes and wear shoes with good support. Drink plenty of water while you exercise to prevent dehydration or heat stroke. Work out until your breathing and your heartbeat get faster (moderate intensity). How often should I exercise? Choose an activity that you enjoy, and set realistic goals. Your health care provider can help you make an activity plan that is individually designed and works best for you. Exercise regularly as told by your health care provider. This may include: Doing strength training two times a week, such as: Lifting weights. Using resistance bands. Push-ups. Sit-ups. Yoga. Doing a certain intensity of exercise for a given amount of time. Choose from these options: A total of 150 minutes of moderate-intensity exercise every week. A total of 75 minutes of vigorous-intensity exercise every week. A mix of moderate-intensity and  vigorous-intensity exercise every week. Children, pregnant women, people who have not exercised regularly, people who are overweight, and older adults may need to talk with a health care provider about what activities are safe to perform. If you have a medical condition, be sure to talk with your health care provider before you start a new exercise program. What are some exercise ideas? Moderate-intensity exercise ideas include: Walking 1 mile (1.6 km) in about 15 minutes. Biking. Hiking. Golfing. Dancing. Water aerobics. Vigorous-intensity exercise ideas include: Walking 4.5 miles (7.2 km) or more in about 1 hour. Jogging or running 5 miles (8 km) in about 1 hour. Biking 10 miles (16.1 km) or more in about 1 hour. Lap swimming. Roller-skating or in-line skating. Cross-country skiing. Vigorous competitive sports, such as football, basketball, and soccer. Jumping rope. Aerobic dancing. What are some everyday activities that can help me get exercise? Yard work, such as: Child psychotherapist. Raking and bagging leaves. Washing your car. Pushing a stroller. Shoveling snow. Gardening. Washing windows or floors. How can I be more active in my day-to-day activities? Use stairs instead of an elevator. Take a walk during your lunch break. If you drive, park your car farther away from your work or school. If you take public transportation, get off one stop early and walk the rest of the way. Stand up or walk around during all of your indoor phone calls. Get up, stretch, and walk around every 30 minutes throughout the day. Enjoy exercise with a friend. Support to continue exercising will help you keep a regular routine of activity. Where to find more information You can find more information about exercising to stay healthy from: U.S. Department of Health and Human Services: ThisPath.fi Centers for Disease Control and Prevention (  CDC): FootballExhibition.com.br Summary Exercising regularly is  important. It will improve your overall fitness, flexibility, and endurance. Regular exercise will also improve your overall health. It can help you control your weight, reduce stress, and improve your bone density. Do not exercise so much that you hurt yourself, feel dizzy, or get very short of breath. Before you start a new exercise program, talk with your health care provider. This information is not intended to replace advice given to you by your health care provider. Make sure you discuss any questions you have with your health care provider. Document Revised: 12/03/2020 Document Reviewed: 12/03/2020 Elsevier Patient Education  2024 [employer].

## 2024-02-05 DIAGNOSIS — Z85828 Personal history of other malignant neoplasm of skin: Secondary | ICD-10-CM | POA: Diagnosis not present

## 2024-02-05 DIAGNOSIS — Z08 Encounter for follow-up examination after completed treatment for malignant neoplasm: Secondary | ICD-10-CM | POA: Diagnosis not present

## 2024-02-05 DIAGNOSIS — C44722 Squamous cell carcinoma of skin of right lower limb, including hip: Secondary | ICD-10-CM | POA: Diagnosis not present

## 2024-02-18 ENCOUNTER — Other Ambulatory Visit: Payer: Self-pay

## 2024-02-18 ENCOUNTER — Other Ambulatory Visit: Payer: Self-pay | Admitting: Physician Assistant

## 2024-02-18 DIAGNOSIS — Z79899 Other long term (current) drug therapy: Secondary | ICD-10-CM

## 2024-02-18 DIAGNOSIS — M0579 Rheumatoid arthritis with rheumatoid factor of multiple sites without organ or systems involvement: Secondary | ICD-10-CM

## 2024-02-18 MED ORDER — ORENCIA CLICKJECT 125 MG/ML ~~LOC~~ SOAJ
125.0000 mg | SUBCUTANEOUS | 0 refills | Status: DC
  Filled 2024-02-18: qty 12, 84d supply, fill #0
  Filled 2024-02-20 – 2024-02-25 (×2): qty 4, 28d supply, fill #0
  Filled 2024-03-17: qty 4, 28d supply, fill #1
  Filled 2024-04-09: qty 4, 28d supply, fill #2

## 2024-02-18 NOTE — Telephone Encounter (Signed)
 Last Fill: 11/19/2023  Labs: 01/29/2024 Lipid panel WNL  RBC count and hemoglobin are borderline low. Rest of CBC WNL  CMP stable.   TB Gold: 10/29/2023 TB Gold Negative    Next Visit: 04/30/2024  Last Visit: 01/29/2024  DX: Rheumatoid arthritis involving multiple sites with positive rheumatoid factor   Current Dose per office note 01/29/2024: Orencia  125 mg sq injections once weekly   Okay to refill Orencia ?

## 2024-02-20 ENCOUNTER — Other Ambulatory Visit: Payer: Self-pay

## 2024-02-20 ENCOUNTER — Other Ambulatory Visit (HOSPITAL_COMMUNITY): Payer: Self-pay

## 2024-02-25 ENCOUNTER — Other Ambulatory Visit: Payer: Self-pay

## 2024-02-25 ENCOUNTER — Other Ambulatory Visit (HOSPITAL_COMMUNITY): Payer: Self-pay

## 2024-02-25 NOTE — Progress Notes (Signed)
 Specialty Pharmacy Refill Coordination Note  Spoke with Fincher,[name] (Sister)  Daniel Griffin is a 63 y.o. male contacted today regarding refills of specialty medication(s) Abatacept  (Orencia  ClickJect)  Injection date: 02/28/24   Patient requested: Delivery   Delivery date: 02/27/24   Verified address: [address]  [place] [address]  Medication will be filled on 02/26/24.

## 2024-03-17 ENCOUNTER — Other Ambulatory Visit: Payer: Self-pay

## 2024-03-17 NOTE — Progress Notes (Signed)
 Specialty Pharmacy Refill Coordination Note  [name] is a 63 y.o. male, patients sister was contacted today regarding refills of specialty medication(s) Abatacept  (Orencia  ClickJect)   Patient requested Delivery   Delivery date: 03/20/24   Verified address: [address]  [place] [place] [facility] [place] [address]   Medication will be filled on 03/19/24.

## 2024-03-18 ENCOUNTER — Other Ambulatory Visit: Payer: Self-pay

## 2024-03-19 ENCOUNTER — Other Ambulatory Visit: Payer: Self-pay

## 2024-03-24 ENCOUNTER — Other Ambulatory Visit (HOSPITAL_COMMUNITY): Payer: Self-pay

## 2024-04-09 ENCOUNTER — Other Ambulatory Visit: Payer: Self-pay

## 2024-04-09 ENCOUNTER — Other Ambulatory Visit (HOSPITAL_COMMUNITY): Payer: Self-pay

## 2024-04-09 NOTE — Progress Notes (Signed)
 Specialty Pharmacy Refill Coordination Note  Spoke with Fincher,[name] (Sister)  CHRISOPHER PUSTEJOVSKY is a 62 y.o. male contacted today regarding refills of specialty medication(s) Abatacept  (Orencia  ClickJect)  Doses on hand: 1 for 04/10/24  Injection date: 04/17/24   Patient requested: Delivery   Delivery date: 04/11/24   Verified address: [address] [facility] [place] [place] [facility] [place] [address]  Medication will be filled on 04/10/24.

## 2024-04-16 NOTE — Progress Notes (Unsigned)
 Office Visit Note  Patient: Daniel Griffin             Date of Birth: May 23, 1961           MRN: [id number]             PCP: Gladis Mustard, FNP Referring: Gladis Mustard, * Visit Date: 04/30/2024 Occupation: @GUAROCC @  Subjective:  Medicaiton monitoring   History of Present Illness: Daniel Griffin is a 63 y.o. male with history of seropositive rheumatoid arthritis.  Patient remains on Orencia  125 mg sq injections once weekly.  He continues to tolerate Orencia  without any side effects and has not had any recent gaps in therapy.  He denies any recent or recurrent infections. He experiences some stiffness in his right hip after sitting for prolonged periods of time and rising from a seated position.  He has occasional stiffness in the right knee replacement.  Overall his symptoms improve while he is more active. He denies any joint swelling.    Activities of Daily Living:  Patient reports morning stiffness for 45 minutes.   Patient Reports nocturnal pain.  Difficulty dressing/grooming: Denies Difficulty climbing stairs: Denies Difficulty getting out of chair: Reports Difficulty using hands for taps, buttons, cutlery, and/or writing: Denies  Review of Systems  Constitutional:  Negative for fatigue.  HENT:  Negative for mouth sores and mouth dryness.   Eyes:  Negative for dryness.  Respiratory:  Positive for shortness of breath. Negative for cough and wheezing.   Cardiovascular:  Negative for chest pain and palpitations.  Gastrointestinal:  Negative for blood in stool, constipation and diarrhea.  Endocrine: Negative for increased urination.  Genitourinary:  Negative for involuntary urination.  Musculoskeletal:  Positive for joint pain, joint pain and morning stiffness. Negative for gait problem, joint swelling, myalgias, muscle weakness, muscle tenderness and myalgias.  Skin:  Negative for color change, rash, hair loss and sensitivity to sunlight.   Allergic/Immunologic: Negative for susceptible to infections.  Neurological:  Negative for dizziness and headaches.  Hematological:  Negative for swollen glands.  Psychiatric/Behavioral:  Negative for depressed mood and sleep disturbance. The patient is not nervous/anxious.     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   Syphilis 09/21/2020   Dementia (HCC) 09/21/2020   Rheumatoid arthritis involving multiple sites with positive rheumatoid factor (HCC) 11/30/2016   High risk medications (not anticoagulants) long-term use 11/30/2016    Past Medical History:  Diagnosis Date   Arthritis    RA   Depression    Seizures (HCC) 06/21/2020   ETOH w/d related   Skin cancer of face 2024    Family History  Problem Relation Age of Onset   Alzheimer's disease Father    Healthy Son    Healthy Daughter    Rheum arthritis Daughter    Healthy Daughter    Past Surgical History:  Procedure Laterality Date   ANKLE FRACTURE SURGERY     Left   APPENDECTOMY     IR 3D INDEPENDENT WKST  09/01/2021   IR 3D INDEPENDENT WKST  12/14/2021   IR ANGIO INTRA EXTRACRAN SEL INTERNAL CAROTID BILAT MOD SED  09/01/2021   IR ANGIO INTRA EXTRACRAN SEL INTERNAL CAROTID UNI R MOD SED  12/14/2021   IR ANGIO VERTEBRAL SEL  VERTEBRAL BILAT MOD SED  09/01/2021   IR ANGIOGRAM FOLLOW UP STUDY  12/14/2021   IR ANGIOGRAM FOLLOW UP STUDY  12/14/2021   IR ANGIOGRAM FOLLOW UP STUDY  12/14/2021   IR CT HEAD LTD  12/14/2021   IR TRANSCATH/EMBOLIZ  12/14/2021   IR US  GUIDE VASC ACCESS RIGHT  09/01/2021   IR US  GUIDE VASC ACCESS RIGHT  12/14/2021   MOHS SURGERY  2024   on face   RADIOLOGY WITH ANESTHESIA N/A 12/14/2021   Procedure: IR WITH ANESTHESIA EMBOLIZATION;  Surgeon: de  Macedo Rodrigues, [place], MD;  Location: [facility];  Service: Radiology;  Laterality: N/A;   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   REPLACEMENT TOTAL KNEE Right 02/2023   REVERSE SHOULDER ARTHROPLASTY Left 05/13/2021   Procedure: REVERSE SHOULDER ARTHROPLASTY;  Surgeon: Kay Kemps, MD;  Location: [facility] ORS;  Service: Orthopedics;  Laterality: Left;  with ISB   REVERSE SHOULDER ARTHROPLASTY Right 10/14/2021   Procedure: REVERSE SHOULDER ARTHROPLASTY;  Surgeon: Kay Kemps, MD;  Location: [facility] ORS;  Service: Orthopedics;  Laterality: Right;  with ISB   Social History   Social History Narrative   Lives alone   Right Handed   Rarely drinks caffeine       Immunization History  Administered Date(s) Administered   Influenza, Seasonal, Injecte, Preservative Fre 05/07/2023   Influenza,inj,Quad PF,6+ Mos 07/04/2018, 08/03/2022   Tdap 05/25/2023     Objective: Vital Signs: BP (!) 152/84 (BP Location: Right Arm, Patient Position: Sitting, Cuff Size: Normal)   Pulse 70   Temp 98.4 F (36.9 C)   Ht 5' 6 (1.676 m)   Wt 192 lb 9.6 oz (87.4 kg)   BMI 31.09 kg/m    Physical Exam Vitals and nursing note reviewed.  Constitutional:      Appearance: He is well-developed.  HENT:     Head: Normocephalic and atraumatic.  Eyes:     Conjunctiva/sclera: Conjunctivae normal.     Pupils: Pupils are equal, round, and reactive to light.  Cardiovascular:     Rate and Rhythm: Normal rate and regular rhythm.     Heart sounds: Normal heart sounds.  Pulmonary:     Effort: Pulmonary effort is normal.     Breath sounds: Normal breath sounds.  Abdominal:     General: Bowel sounds are normal.     Palpations: Abdomen is soft.  Musculoskeletal:     Cervical back: Normal range of motion and neck supple.  Skin:    General: Skin is warm and dry.     Capillary Refill: Capillary refill takes less than 2 seconds.  Neurological:     Mental Status: He is alert and oriented to person, place, and time.   Psychiatric:        Behavior: Behavior normal.      Musculoskeletal Exam:C-spine, thoracic spine, lumbar spine have good range of motion.  No midline spinal tenderness.  No SI joint tenderness.  Bilateral shoulder replacements have limited internal rotation but no tenderness upon palpation.  Elbow joints, wrist joints, MCPs, PIPs, DIPs have good range of motion with no synovitis.  PIP and DIP thickening consistent with osteoarthritis of both hands.  Complete fist formation bilaterally.  Hip joints have good range of motion with no groin pain.  Some discomfort range of motion of the right hip.  Bilateral knee replacements have good range of motion with no effusion.  Ankle joints have good range of motion no tenderness or joint swelling.  No evidence of Achilles tendinitis or  plantar fasciitis.   CDAI Exam: CDAI Score: -- Patient Global: --; Provider Global: -- Swollen: --; Tender: -- Joint Exam 04/30/2024   No joint exam has been documented for this visit   There is currently no information documented on the homunculus. Go to the Rheumatology activity and complete the homunculus joint exam.  Investigation: No additional findings.  Imaging: No results found.  Recent Labs: Lab Results  Component Value Date   WBC 6.3 01/29/2024   HGB 13.1 (L) 01/29/2024   PLT 308 01/29/2024   NA 134 (L) 01/29/2024   K 4.9 01/29/2024   CL 97 (L) 01/29/2024   CO2 31 01/29/2024   GLUCOSE 103 (H) 01/29/2024   BUN 14 01/29/2024   CREATININE 0.79 01/29/2024   BILITOT 0.4 01/29/2024   ALKPHOS 81 08/03/2023   AST 18 01/29/2024   ALT 14 01/29/2024   PROT 6.7 01/29/2024   ALBUMIN 4.1 08/03/2023   CALCIUM 8.9 01/29/2024   GFRAA 112 02/25/2021   QFTBGOLD NEGATIVE 07/03/2017   QFTBGOLDPLUS NEGATIVE 10/29/2023    Speciality Comments: Prior therapy includes: Enbrel  (pancytopenia) and methotrexate (inadequate response) Orencia -07/09/18 Enbrel -1/19-10/19  Procedures:  No procedures  performed Allergies: Patient has no known allergies.    Assessment / Plan:     Visit Diagnoses: Rheumatoid arthritis involving multiple sites with positive rheumatoid factor (HCC): He has no synovitis on examination today.  He has not had any signs or symptoms of a rheumatoid arthritis flare.  He has clinically been doing well on Orencia  125 mg subcu days injections once weekly.  He is tolerating Orencia  without any side effects or injection site reactions.  He has not had any recent gaps in therapy.  No recent or recurrent infections.  Patient remain on Orencia  as monotherapy.  He was advised to notify us  if he develops any signs or symptoms of a flare.  Follow-up in the office in 3 months or sooner if needed.  High risk medications (not anticoagulants) long-term use - Orencia  125 mg sq injections once weekly. Previous therapy: Enbrel , methotrexate.  CBC and CMP updated on 01/29/24.  Orders for CBC and CMP released today.  His next lab work will be due in December and every 3 months to monitor for drug toxicity. Lipid panel updated on 01/29/24 TB gold negative on 10/29/23 No recent or recurrent infections. Discussed the importance of holding orencia  if he develops signs or symptoms of an infection and to resume once the infection has completely cleared. - Plan: CBC with Differential/Platelet, Comprehensive metabolic panel with GFR  Status post replacement of both shoulder joints: Slightly limited internal rotation.  No discomfort or tenderness upon palpation.  Primary osteoarthritis of both hands: PIP and DIP thickening consistent with osteoarthritis of both hands.  No synovitis noted on exam.  Complete fist formation bilaterally.  Pain in right hip: Patient experiences intermittent discomfort and stiffness in the right hip especially after sitting for prolonged periods of time and rising from a seated position.  On examination he has mild discomfort with range of motion of the right hip.  Offered  to obtain x-rays today but he would like to hold off at this time since his symptoms have been mild and intermittent.  He was advised to notify us  if his symptoms persist or worsen.  S/P total knee arthroplasty, left - Under the care of Dr. Melvenia.  Doing well.  No warmth or effusion noted.  S/P total knee arthroplasty, right - Performed by Dr. Melvenia.  Completed PT. Slightly limited  extension on exam.   Primary osteoarthritis of both feet: He has good range of motion of both ankle joints with no tenderness or joint swelling.  Other medical conditions are listed as follows:  S/P coil embolization of cerebral aneurysm  Latent syphilis  History of alcohol abuse  History of neutropenia: CBC with differential updated today  Orders: Orders Placed This Encounter  Procedures   CBC with Differential/Platelet   Comprehensive metabolic panel with GFR   No orders of the defined types were placed in this encounter.   Follow-Up Instructions: Return in about 3 months (around 07/30/2024) for Rheumatoid arthritis.   Waddell CHRISTELLA Craze, PA-C  Note - This record has been created using Dragon software.  Chart creation errors have been sought, but may not always  have been located. Such creation errors do not reflect on  the standard of medical care.

## 2024-04-30 ENCOUNTER — Encounter: Payer: Self-pay | Admitting: Physician Assistant

## 2024-04-30 ENCOUNTER — Ambulatory Visit: Attending: Physician Assistant | Admitting: Physician Assistant

## 2024-04-30 VITALS — BP 152/84 | HR 70 | Temp 98.4°F | Ht 66.0 in | Wt 192.6 lb

## 2024-04-30 DIAGNOSIS — M19042 Primary osteoarthritis, left hand: Secondary | ICD-10-CM

## 2024-04-30 DIAGNOSIS — Z96651 Presence of right artificial knee joint: Secondary | ICD-10-CM | POA: Diagnosis not present

## 2024-04-30 DIAGNOSIS — M19071 Primary osteoarthritis, right ankle and foot: Secondary | ICD-10-CM | POA: Diagnosis not present

## 2024-04-30 DIAGNOSIS — M25551 Pain in right hip: Secondary | ICD-10-CM | POA: Diagnosis not present

## 2024-04-30 DIAGNOSIS — A530 Latent syphilis, unspecified as early or late: Secondary | ICD-10-CM

## 2024-04-30 DIAGNOSIS — M0579 Rheumatoid arthritis with rheumatoid factor of multiple sites without organ or systems involvement: Secondary | ICD-10-CM

## 2024-04-30 DIAGNOSIS — Z96652 Presence of left artificial knee joint: Secondary | ICD-10-CM

## 2024-04-30 DIAGNOSIS — Z96611 Presence of right artificial shoulder joint: Secondary | ICD-10-CM | POA: Diagnosis not present

## 2024-04-30 DIAGNOSIS — Z79899 Other long term (current) drug therapy: Secondary | ICD-10-CM

## 2024-04-30 DIAGNOSIS — Z862 Personal history of diseases of the blood and blood-forming organs and certain disorders involving the immune mechanism: Secondary | ICD-10-CM

## 2024-04-30 DIAGNOSIS — Z9889 Other specified postprocedural states: Secondary | ICD-10-CM | POA: Diagnosis not present

## 2024-04-30 DIAGNOSIS — Z96612 Presence of left artificial shoulder joint: Secondary | ICD-10-CM

## 2024-04-30 DIAGNOSIS — M19041 Primary osteoarthritis, right hand: Secondary | ICD-10-CM | POA: Diagnosis not present

## 2024-04-30 DIAGNOSIS — F1011 Alcohol abuse, in remission: Secondary | ICD-10-CM

## 2024-04-30 DIAGNOSIS — M19072 Primary osteoarthritis, left ankle and foot: Secondary | ICD-10-CM

## 2024-04-30 LAB — CBC WITH DIFFERENTIAL/PLATELET
Absolute Lymphocytes: 1084 {cells}/uL (ref 850–3900)
Absolute Monocytes: 844 {cells}/uL (ref 200–950)
Basophils Absolute: 50 {cells}/uL (ref 0–200)
Basophils Relative: 0.8 %
Eosinophils Absolute: 57 {cells}/uL (ref 15–500)
Eosinophils Relative: 0.9 %
HCT: 42.1 % (ref 38.5–50.0)
Hemoglobin: 14.4 g/dL (ref 13.2–17.1)
MCH: 33.8 pg — ABNORMAL HIGH (ref 27.0–33.0)
MCHC: 34.2 g/dL (ref 32.0–36.0)
MCV: 98.8 fL (ref 80.0–100.0)
MPV: 9.3 fL (ref 7.5–12.5)
Monocytes Relative: 13.4 %
Neutro Abs: 4265 {cells}/uL (ref 1500–7800)
Neutrophils Relative %: 67.7 %
Platelets: 256 Thousand/uL (ref 140–400)
RBC: 4.26 Million/uL (ref 4.20–5.80)
RDW: 12.1 % (ref 11.0–15.0)
Total Lymphocyte: 17.2 %
WBC: 6.3 Thousand/uL (ref 3.8–10.8)

## 2024-04-30 LAB — COMPREHENSIVE METABOLIC PANEL WITH GFR
AG Ratio: 1.4 (calc) (ref 1.0–2.5)
ALT: 21 U/L (ref 9–46)
AST: 23 U/L (ref 10–35)
Albumin: 4.1 g/dL (ref 3.6–5.1)
Alkaline phosphatase (APISO): 76 U/L (ref 35–144)
BUN: 10 mg/dL (ref 7–25)
CO2: 30 mmol/L (ref 20–32)
Calcium: 9.2 mg/dL (ref 8.6–10.3)
Chloride: 98 mmol/L (ref 98–110)
Creat: 0.8 mg/dL (ref 0.70–1.35)
Globulin: 2.9 g/dL (ref 1.9–3.7)
Glucose, Bld: 110 mg/dL — ABNORMAL HIGH (ref 65–99)
Potassium: 5.2 mmol/L (ref 3.5–5.3)
Sodium: 135 mmol/L (ref 135–146)
Total Bilirubin: 0.7 mg/dL (ref 0.2–1.2)
Total Protein: 7 g/dL (ref 6.1–8.1)
eGFR: 99 mL/min/1.73m2 (ref >=60)

## 2024-05-01 ENCOUNTER — Other Ambulatory Visit: Payer: Self-pay

## 2024-05-01 ENCOUNTER — Ambulatory Visit: Payer: Self-pay | Admitting: Rheumatology

## 2024-05-01 NOTE — Progress Notes (Signed)
 CBC is normal, CMP shows elevated glucose of 110 most likely not a fasting sample

## 2024-05-26 ENCOUNTER — Other Ambulatory Visit: Payer: Self-pay | Admitting: Pharmacy Technician

## 2024-05-26 ENCOUNTER — Ambulatory Visit: Payer: Medicare Other

## 2024-05-26 ENCOUNTER — Other Ambulatory Visit: Payer: Self-pay | Admitting: Physician Assistant

## 2024-05-26 ENCOUNTER — Other Ambulatory Visit: Payer: Self-pay

## 2024-05-26 VITALS — BP 152/84 | HR 70 | Ht 66.0 in | Wt 192.0 lb

## 2024-05-26 DIAGNOSIS — M0579 Rheumatoid arthritis with rheumatoid factor of multiple sites without organ or systems involvement: Secondary | ICD-10-CM

## 2024-05-26 DIAGNOSIS — Z0000 Encounter for general adult medical examination without abnormal findings: Secondary | ICD-10-CM | POA: Diagnosis not present

## 2024-05-26 DIAGNOSIS — Z79899 Other long term (current) drug therapy: Secondary | ICD-10-CM

## 2024-05-26 MED ORDER — ORENCIA CLICKJECT 125 MG/ML ~~LOC~~ SOAJ
125.0000 mg | SUBCUTANEOUS | 0 refills | Status: DC
  Filled 2024-05-26: qty 4, 28d supply, fill #0
  Filled 2024-06-18: qty 4, 28d supply, fill #1
  Filled 2024-07-24: qty 4, 28d supply, fill #2

## 2024-05-26 NOTE — Patient Instructions (Signed)
 [name],  Thank you for taking the time for your Medicare Wellness Visit. I appreciate your continued commitment to your health goals. Please review the care plan we discussed, and feel free to reach out if I can assist you further.  Medicare recommends these wellness visits once per year to help you and your care team stay ahead of potential health issues. These visits are designed to focus on prevention, allowing your provider to concentrate on managing your acute and chronic conditions during your regular appointments.  Please note that Annual Wellness Visits do not include a physical exam. Some assessments may be limited, especially if the visit was conducted virtually. If needed, we may recommend a separate in-person follow-up with your provider.  Ongoing Care Seeing your primary care provider every 3 to 6 months helps us  monitor your health and provide consistent, personalized care.   Referrals If a referral was made during today's visit and you haven't received any updates within two weeks, please contact the referred provider directly to check on the status.  Recommended Screenings:  Health Maintenance  Topic Date Due   COVID-19 Vaccine (1) Never done   Zoster (Shingles) Vaccine (1 of 2) Never done   Pneumococcal Vaccine for age over 23 (1 of 1 - PCV) Never done   Flu Shot  03/21/2024   Medicare Annual Wellness Visit  05/24/2024   Colon Cancer Screening  01/31/2025*   DTaP/Tdap/Td vaccine (2 - Td or Tdap) 05/24/2033   Hepatitis C Screening  Completed   HIV Screening  Completed   Hepatitis B Vaccine  Aged Out   HPV Vaccine  Aged Out   Meningitis B Vaccine  Aged Out  *Topic was postponed. The date shown is not the original due date.       05/26/2024   12:29 PM  Advanced Directives  Does Patient Have a Medical Advance Directive? No   Advance Care Planning is important because it: Ensures you receive medical care that aligns with your values, goals, and  preferences. Provides guidance to your family and loved ones, reducing the emotional burden of decision-making during critical moments.  Vision: Annual vision screenings are recommended for early detection of glaucoma, cataracts, and diabetic retinopathy. These exams can also reveal signs of chronic conditions such as diabetes and high blood pressure.  Dental: Annual dental screenings help detect early signs of oral cancer, gum disease, and other conditions linked to overall health, including heart disease and diabetes.  Please see the attached documents for additional preventive care recommendations.

## 2024-05-26 NOTE — Progress Notes (Signed)
 Subjective:   Daniel Griffin is a 63 y.o. who presents for a Medicare Wellness preventive visit.  As a reminder, Annual Wellness Visits don't include a physical exam, and some assessments may be limited, especially if this visit is performed virtually. We may recommend an in-person follow-up visit with your provider if needed.  Visit Complete: Virtual I connected with  [name] on 05/26/24 by a audio enabled telemedicine application and verified that I am speaking with the correct person using two identifiers.  Patient Location: Home  Provider Location: Home Office  I discussed the limitations of evaluation and management by telemedicine. The patient expressed understanding and agreed to proceed.  Vital Signs: Because this visit was a virtual/telehealth visit, some criteria may be missing or patient reported. Any vitals not documented were not able to be obtained and vitals that have been documented are patient reported.  VideoDeclined- This patient declined Librarian, academic. Therefore the visit was completed with audio only.  Persons Participating in Visit: assisted w/pt's sister, [name]  AWV Questionnaire: No: Patient Medicare AWV questionnaire was not completed prior to this visit.  Cardiac Risk Factors include: advanced age (>20men, >47 women);hypertension;male gender;obesity (BMI >30kg/m2)     Objective:    Today's Vitals   05/26/24 1225  BP: (!) 152/84  Pulse: 70  Weight: 192 lb (87.1 kg)  Height: 5' 6 (1.676 m)   Body mass index is 30.99 kg/m.     05/26/2024   12:29 PM 05/25/2023    9:43 AM 05/16/2023   12:32 PM 12/13/2022    9:31 AM 05/31/2022    6:59 PM 12/14/2021   12:30 PM 10/05/2021    9:47 AM  Advanced Directives  Does Patient Have a Medical Advance Directive? No No No No No No No  Would patient like information on creating a medical advance directive?  No - Patient declined    No - Patient declined     Current  Medications (verified) Outpatient Encounter Medications as of 05/26/2024  Medication Sig   Abatacept  (ORENCIA  CLICKJECT) 125 MG/ML SOAJ INJECT 125 MG INTO THE SKIN ONCE A WEEK.   buPROPion  (WELLBUTRIN  XL) 150 MG 24 hr tablet Take 1 tablet (150 mg total) by mouth daily.   escitalopram  (LEXAPRO ) 10 MG tablet Take 1 tablet (10 mg total) by mouth daily.   lisinopril  (ZESTRIL ) 20 MG tablet Take 1 tablet (20 mg total) by mouth daily.   predniSONE  (DELTASONE ) 5 MG tablet Take 4 tabs po x 4 days, 3  tabs po x 4 days, 2  tabs po x 4 days, 1  tab po x 4 days (Patient not taking: Reported on 05/26/2024)   No facility-administered encounter medications on file as of 05/26/2024.    Allergies (verified) Patient has no known allergies.   History: Past Medical History:  Diagnosis Date   Arthritis    RA   Depression    Seizures (HCC) 06/21/2020   ETOH w/d related   Skin cancer of face 2024   Past Surgical History:  Procedure Laterality Date   ANKLE FRACTURE SURGERY     Left   APPENDECTOMY     IR 3D INDEPENDENT WKST  09/01/2021   IR 3D INDEPENDENT WKST  12/14/2021   IR ANGIO INTRA EXTRACRAN SEL INTERNAL CAROTID BILAT MOD SED  09/01/2021   IR ANGIO INTRA EXTRACRAN SEL INTERNAL CAROTID UNI R MOD SED  12/14/2021   IR ANGIO VERTEBRAL SEL VERTEBRAL BILAT MOD SED  09/01/2021  IR ANGIOGRAM FOLLOW UP STUDY  12/14/2021   IR ANGIOGRAM FOLLOW UP STUDY  12/14/2021   IR ANGIOGRAM FOLLOW UP STUDY  12/14/2021   IR CT HEAD LTD  12/14/2021   IR TRANSCATH/EMBOLIZ  12/14/2021   IR US  GUIDE VASC ACCESS RIGHT  09/01/2021   IR US  GUIDE VASC ACCESS RIGHT  12/14/2021   MOHS SURGERY  2024   on face   RADIOLOGY WITH ANESTHESIA N/A 12/14/2021   Procedure: IR WITH ANESTHESIA EMBOLIZATION;  Surgeon: de Macedo Rodrigues, Katyucia, MD;  Location: [facility] OR;  Service: Radiology;  Laterality: N/A;   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   REPLACEMENT TOTAL KNEE Right 02/2023   REVERSE SHOULDER ARTHROPLASTY Left 05/13/2021    Procedure: REVERSE SHOULDER ARTHROPLASTY;  Surgeon: Kay Kemps, MD;  Location: [facility] ORS;  Service: Orthopedics;  Laterality: Left;  with ISB   REVERSE SHOULDER ARTHROPLASTY Right 10/14/2021   Procedure: REVERSE SHOULDER ARTHROPLASTY;  Surgeon: Kay Kemps, MD;  Location: [facility] ORS;  Service: Orthopedics;  Laterality: Right;  with ISB   Family History  Problem Relation Age of Onset   Alzheimer's disease Father    Healthy Son    Healthy Daughter    Rheum arthritis Daughter    Healthy Daughter    Social History   Socioeconomic History   Marital status: Single    Spouse name: Not on file   Number of children: Not on file   Years of education: Not on file   Highest education level: Not on file  Occupational History   Occupation: not worked since seizure 06/21/20  Tobacco Use   Smoking status: Never    Passive exposure: Past   Smokeless tobacco: Current    Types: Snuff, Chew  Vaping Use   Vaping status: Never Used  Substance and Sexual Activity   Alcohol use: Yes    Comment: occ   Drug use: Yes    Types: Marijuana    Comment: occ   Sexual activity: Not on file  Other Topics Concern   Not on file  Social History Narrative   Lives alone   Right Handed   Rarely drinks caffeine       Social Drivers of Corporate investment banker Strain: Low Risk  (05/26/2024)   Overall Financial Resource Strain (CARDIA)    Difficulty of Paying Living Expenses: Not very hard  Food Insecurity: No Food Insecurity (05/26/2024)   Hunger Vital Sign    Worried About Running Out of Food in the Last Year: Never true    Ran Out of Food in the Last Year: Never true  Transportation Needs: No Transportation Needs (05/26/2024)   PRAPARE - Administrator, Civil Service (Medical): No    Lack of Transportation (Non-Medical): No  Physical Activity: Insufficiently Active (05/26/2024)   Exercise Vital Sign    Days of Exercise per Week: 7 days    Minutes of Exercise per Session: 20 min  Stress: No  Stress Concern Present (05/26/2024)   [employer] of Occupational Health - Occupational Stress Questionnaire    Feeling of Stress: Not at all  Social Connections: Socially Isolated (05/26/2024)   Social Connection and Isolation Panel    Frequency of Communication with Friends and Family: More than three times a week    Frequency of Social Gatherings with Friends and Family: Three times a week    Attends Religious Services: Never    Active Member of Clubs or Organizations: No    Attends Banker Meetings:  Never    Marital Status: Divorced    Tobacco Counseling Ready to quit: Not Answered Counseling given: Yes    Clinical Intake:  Pre-visit preparation completed: Yes  Pain : No/denies pain     BMI - recorded: 30.99 Nutritional Status: BMI > 30  Obese Nutritional Risks: None Diabetes: No  Lab Results  Component Value Date   HGBA1C 5.3 02/15/2021   HGBA1C 5.0 06/21/2020     How often do you need to have someone help you when you read instructions, pamphlets, or other written materials from your doctor or pharmacy?: 3 - Sometimes (pt's sister)  Interpreter Needed?: No  Information entered by :: alia t/cma   Activities of Daily Living     05/26/2024   12:29 PM  In your present state of health, do you have any difficulty performing the following activities:  Hearing? 0  Vision? 0  Difficulty concentrating or making decisions? 0  Walking or climbing stairs? 0  Dressing or bathing? 0  Doing errands, shopping? 0  Preparing Food and eating ? N  Using the Toilet? N  In the past six months, have you accidently leaked urine? N  Do you have problems with loss of bowel control? N  Managing your Medications? N  Managing your Finances? Y  Comment pt's sister  Housekeeping or managing your Housekeeping? N    Patient Care Team: Gladis Mustard, FNP as PCP - General (Family Medicine) Alvan Ronal BRAVO, MD (Inactive) as PCP - Cardiology  (Cardiology)  I have updated your Care Teams any recent Medical Services you may have received from other providers in the past year.     Assessment:   This is a routine wellness examination for [place].  Hearing/Vision screen Hearing Screening - Comments:: Pt denies hearing dif Vision Screening - Comments:: Pt wear glasses/pt goes [facility] [address] in [place]/last 2025   Goals Addressed   None    Depression Screen     05/26/2024   12:32 PM 02/01/2024   10:31 AM 08/03/2023   10:26 AM 05/07/2023   12:26 PM 04/02/2023    8:17 AM 02/02/2023   10:28 AM 10/24/2022    2:02 PM  PHQ 2/9 Scores  PHQ - 2 Score 0 0 0 0  0 0  PHQ- 9 Score   0 0  0 0  Exception Documentation     Patient refusal      Fall Risk     05/26/2024   12:27 PM 02/01/2024   10:31 AM 08/03/2023   10:25 AM 05/25/2023    9:44 AM 05/07/2023   12:26 PM  Fall Risk   Falls in the past year? 0 0 0 0 0  Number falls in past yr: 0  0    Injury with Fall? 0  0    Risk for fall due to : No Fall Risks  No Fall Risks    Follow up Falls evaluation completed  Falls evaluation completed      MEDICARE RISK AT HOME:  Medicare Risk at Home Any stairs in or around the home?: Yes If so, are there any without handrails?: Yes Home free of loose throw rugs in walkways, pet beds, electrical cords, etc?: Yes Adequate lighting in your home to reduce risk of falls?: Yes Life alert?: No Use of a cane, walker or w/c?: No Grab bars in the bathroom?: Yes Shower chair or bench in shower?: Yes Elevated toilet seat or a handicapped toilet?: Yes  TIMED UP AND GO:  Was the test performed?  no  Cognitive Function: 6CIT completed    10/30/2022   10:30 AM 10/27/2021   10:31 AM 04/21/2021   11:13 AM 01/06/2021    3:41 PM  MMSE - Mini Mental State Exam  Orientation to time 5 4 3 3   Orientation to Place 5 4 2 5   Registration 3 3 2 3   Attention/ Calculation 3 3 1  0  Recall 2 2 2 2   Language- name 2 objects 2 2 2 2   Language- repeat 0 0 0 0   Language- follow 3 step command 3 3 2 3   Language- read & follow direction 1 0 0 0  Language-read & follow direction-comments  unable to read    Write a sentence 0 0 0 0  Write a sentence-comments pt cant wright unable to write    Copy design 0 0 0 1  Copy design-comments  unable to write    Total score 24 21 14 19         05/26/2024   12:36 PM 05/25/2023    9:44 AM  6CIT Screen  What Year? 0 points 4 points  What month? 0 points 3 points  What time? 0 points 0 points  Count back from 20 0 points 0 points  Months in reverse 0 points 2 points  Repeat phrase 0 points 2 points  Total Score 0 points 11 points    Immunizations Immunization History  Administered Date(s) Administered   Influenza, Seasonal, Injecte, Preservative Fre 05/07/2023   Influenza,inj,Quad PF,6+ Mos 07/04/2018, 08/03/2022   Tdap 05/25/2023    Screening Tests Health Maintenance  Topic Date Due   COVID-19 Vaccine (1) Never done   Zoster Vaccines- Shingrix (1 of 2) Never done   Pneumococcal Vaccine: 50+ Years (1 of 1 - PCV) Never done   Influenza Vaccine  03/21/2024   Colonoscopy  01/31/2025 (Originally 07/11/2005)   Medicare Annual Wellness (AWV)  05/26/2025   DTaP/Tdap/Td (2 - Td or Tdap) 05/24/2033   Hepatitis C Screening  Completed   HIV Screening  Completed   Hepatitis B Vaccines 19-59 Average Risk  Aged Out   HPV VACCINES  Aged Out   Meningococcal B Vaccine  Aged Out    Health Maintenance Items Addressed: See Nurse Notes at the end of this note  Additional Screening:  Vision Screening: Recommended annual ophthalmology exams for early detection of glaucoma and other disorders of the eye. Is the patient up to date with their annual eye exam?  Yes  Who is the provider or what is the name of the office in which the patient attends annual eye exams? [facility]  Dental Screening: Recommended annual dental exams for proper oral hygiene  Community Resource Referral / Chronic Care  Management: CRR required this visit?  No   CCM required this visit?  No   Plan:    I have personally reviewed and noted the following in the patient's chart:   Medical and social history Use of alcohol, tobacco or illicit drugs  Current medications and supplements including opioid prescriptions. Patient is not currently taking opioid prescriptions. Functional ability and status Nutritional status Physical activity Advanced directives List of other physicians Hospitalizations, surgeries, and ER visits in previous 12 months Vitals Screenings to include cognitive, depression, and falls Referrals and appointments  In addition, I have reviewed and discussed with patient certain preventive protocols, quality metrics, and best practice recommendations. A written personalized care plan for preventive services as well as general  preventive health recommendations were provided to patient.   Ozie Ned, CMA   05/26/2024   After Visit Summary: (MyChart) Due to this being a telephonic visit, the after visit summary with patients personalized plan was offered to patient via MyChart   Notes: Nothing significant to report at this time.

## 2024-05-26 NOTE — Progress Notes (Signed)
 Specialty Pharmacy Refill Coordination Note  Daniel Griffin is a 63 y.o. male contacted today regarding refills of specialty medication(s) Abatacept  (Orencia  ClickJect)  Spoke with Sister  Patient requested Delivery   Delivery date: 05/29/24   Verified address: [address] [facility]   Medication will be filled on 05/28/24.   This fill date is pending response to refill request from provider. Patient is aware and if they have not received fill by intended date they must follow up with pharmacy.

## 2024-05-26 NOTE — Telephone Encounter (Signed)
 Last Fill: 02/18/2024  Labs: 04/30/2024 CBC is normal, CMP shows elevated glucose of 110 most likely not a fasting sample  TB Gold: 10/29/2023 TB gold negative  Next Visit: 07/30/2024  Last Visit: 04/30/2024  IK:Myzlfjunpi arthritis involving multiple sites with positive rheumatoid factor (HCC)   Current Dose per office note 04/30/2024: Orencia  125 mg sq injections once weekly.   Okay to refill Orencia ?

## 2024-05-27 ENCOUNTER — Other Ambulatory Visit: Payer: Self-pay

## 2024-06-02 ENCOUNTER — Other Ambulatory Visit: Payer: Self-pay

## 2024-06-04 ENCOUNTER — Other Ambulatory Visit: Payer: Self-pay

## 2024-06-04 NOTE — Progress Notes (Signed)
 Specialty Pharmacy Ongoing Clinical Assessment Note  Daniel Griffin is a 63 y.o. male who is being followed by the specialty pharmacy service for RxSp Rheumatoid Arthritis   Patient's specialty medication(s) reviewed today: Abatacept  (Orencia  ClickJect)   Missed doses in the last 4 weeks: 0   Patient/Caregiver did not have any additional questions or concerns.   Therapeutic benefit summary: Patient is achieving benefit   Adverse events/side effects summary: No adverse events/side effects   Patient's therapy is appropriate to: Continue    Goals Addressed             This Visit's Progress    Maintain optimal adherence to therapy   On track    Patient is on track. Patient will maintain adherence, adhere to provider and/or lab appointments, avoid flare triggers, and be monitored by provider to determine if a change in treatment plan is warranted      Minimize recurrence of flares   On track    Patient is on track. Patient will maintain adherence and avoid flare triggers. Patient's sister stated that he had a small flare up a couple weeks ago, but it resolved quickly without needing additional medication or interventions         Follow up: 12 months  Advertising account planner

## 2024-06-13 ENCOUNTER — Telehealth: Payer: Self-pay | Admitting: Nurse Practitioner

## 2024-06-13 DIAGNOSIS — Z0279 Encounter for issue of other medical certificate: Secondary | ICD-10-CM

## 2024-06-13 NOTE — Telephone Encounter (Signed)
 PT dropped off FMLA forms to be completed and signed.  Form Fee Paid? (Y/N)       yes     If NO, form is placed on front office manager desk to hold until payment received. If YES, then form will be placed in the RX/HH Nurse Coordinators box for completion.  Form will not be processed until payment is received

## 2024-06-18 ENCOUNTER — Other Ambulatory Visit (HOSPITAL_COMMUNITY): Payer: Self-pay

## 2024-06-18 ENCOUNTER — Other Ambulatory Visit: Payer: Self-pay

## 2024-06-18 NOTE — Progress Notes (Signed)
 Specialty Pharmacy Refill Coordination Note  Daniel Griffin is a 63 y.o. male contacted today regarding refills of specialty medication(s) Abatacept  (Orencia  ClickJect)   Patient requested Delivery   Delivery date: 06/26/24   Verified address: [address] [facility] [place]   Medication will be filled on: 06/25/24

## 2024-06-19 NOTE — Telephone Encounter (Signed)
 PCP completed and signed physicians statement forms. They have been faxed to [facility] at fax number [phone]. Patient has been contacted and informed they are complete. Copy at front desk.

## 2024-06-25 ENCOUNTER — Other Ambulatory Visit: Payer: Self-pay

## 2024-06-30 ENCOUNTER — Other Ambulatory Visit (HOSPITAL_COMMUNITY): Payer: Self-pay

## 2024-07-16 ENCOUNTER — Other Ambulatory Visit (HOSPITAL_COMMUNITY): Payer: Self-pay

## 2024-07-16 NOTE — Progress Notes (Deleted)
 Office Visit Note  Patient: Daniel Griffin             Date of Birth: 1961-06-06           MRN: [id number]             PCP: Gladis Mustard, FNP Referring: Gladis Mustard, * Visit Date: 07/30/2024 Occupation: Data Unavailable  Subjective:    History of Present Illness: Daniel Griffin is a 63 y.o. male with history of rheumatoid arthritis.  Patient remains on orencia  125 mg sq injections once weekly.    CBC and CMP updated on 04/30/24. Orders for CBC and CMP released today.    Orencia  125 mg sq injections once weekly. Previous therapy: Enbrel , methotrexate.  CBC and CMP updated on 01/29/24.  Orders for CBC and CMP released today.  His next lab work will be due in December and every 3 months to monitor for drug toxicity. Lipid panel updated on 01/29/24 TB gold negative on 10/29/23  Activities of Daily Living:  Patient reports morning stiffness for *** {minute/hour:19697}.   Patient {ACTIONS;DENIES/REPORTS:21021675::Denies} nocturnal pain.  Difficulty dressing/grooming: {ACTIONS;DENIES/REPORTS:21021675::Denies} Difficulty climbing stairs: {ACTIONS;DENIES/REPORTS:21021675::Denies} Difficulty getting out of chair: {ACTIONS;DENIES/REPORTS:21021675::Denies} Difficulty using hands for taps, buttons, cutlery, and/or writing: {ACTIONS;DENIES/REPORTS:21021675::Denies}  No Rheumatology ROS completed.   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   Syphilis 09/21/2020   Dementia (HCC) 09/21/2020   Rheumatoid arthritis involving multiple sites with positive rheumatoid factor (HCC) 11/30/2016   High risk medications (not anticoagulants) long-term use 11/30/2016    Past Medical History:  Diagnosis Date   Arthritis    RA    Depression    Seizures (HCC) 06/21/2020   ETOH w/d related   Skin cancer of face 2024    Family History  Problem Relation Age of Onset   Alzheimer's disease Father    Healthy Son    Healthy Daughter    Rheum arthritis Daughter    Healthy Daughter    Past Surgical History:  Procedure Laterality Date   ANKLE FRACTURE SURGERY     Left   APPENDECTOMY     IR 3D INDEPENDENT WKST  09/01/2021   IR 3D INDEPENDENT WKST  12/14/2021   IR ANGIO INTRA EXTRACRAN SEL INTERNAL CAROTID BILAT MOD SED  09/01/2021   IR ANGIO INTRA EXTRACRAN SEL INTERNAL CAROTID UNI R MOD SED  12/14/2021   IR ANGIO VERTEBRAL SEL VERTEBRAL BILAT MOD SED  09/01/2021   IR ANGIOGRAM FOLLOW UP STUDY  12/14/2021   IR ANGIOGRAM FOLLOW UP STUDY  12/14/2021   IR ANGIOGRAM FOLLOW UP STUDY  12/14/2021   IR CT HEAD LTD  12/14/2021   IR TRANSCATH/EMBOLIZ  12/14/2021   IR US  GUIDE VASC ACCESS RIGHT  09/01/2021   IR US  GUIDE VASC ACCESS RIGHT  12/14/2021   MOHS SURGERY  2024   on face   RADIOLOGY WITH ANESTHESIA N/A 12/14/2021   Procedure: IR WITH ANESTHESIA EMBOLIZATION;  Surgeon: de Macedo Rodrigues, Katyucia, MD;  Location: [facility] OR;  Service: Radiology;  Laterality: N/A;   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   REPLACEMENT TOTAL KNEE Right 02/2023   REVERSE SHOULDER ARTHROPLASTY Left 05/13/2021   Procedure: REVERSE SHOULDER ARTHROPLASTY;  Surgeon: Kay Kemps, MD;  Location: [facility];  Service: Orthopedics;  Laterality: Left;  with ISB   REVERSE SHOULDER ARTHROPLASTY Right 10/14/2021   Procedure: REVERSE SHOULDER ARTHROPLASTY;  Surgeon: Kay Kemps, MD;  Location: [facility];  Service: Orthopedics;  Laterality: Right;  with ISB   Social History   Tobacco Use   Smoking status: Never    Passive exposure: Past   Smokeless tobacco: Current    Types: Snuff, Chew  Vaping Use   Vaping status: Never Used  Substance Use Topics   Alcohol use: Yes    Comment: occ   Drug use: Yes    Types: Marijuana    Comment: occ   Social  History   Social History Narrative   Lives alone   Right Handed   Rarely drinks caffeine         Immunization History  Administered Date(s) Administered   Influenza, Seasonal, Injecte, Preservative Fre 05/07/2023   Influenza,inj,Quad PF,6+ Mos 07/04/2018, 08/03/2022   Tdap 05/25/2023     Objective: Vital Signs: There were no vitals taken for this visit.   Physical Exam Vitals and nursing note reviewed.  Constitutional:      Appearance: He is well-developed.  HENT:     Head: Normocephalic and atraumatic.  Eyes:     Conjunctiva/sclera: Conjunctivae normal.     Pupils: Pupils are equal, round, and reactive to light.  Cardiovascular:     Rate and Rhythm: Normal rate and regular rhythm.     Heart sounds: Normal heart sounds.  Pulmonary:     Effort: Pulmonary effort is normal.     Breath sounds: Normal breath sounds.  Abdominal:     General: Bowel sounds are normal.     Palpations: Abdomen is soft.  Musculoskeletal:     Cervical back: Normal range of motion and neck supple.  Skin:    General: Skin is warm and dry.     Capillary Refill: Capillary refill takes less than 2 seconds.  Neurological:     Mental Status: He is alert and oriented to person, place, and time.  Psychiatric:        Behavior: Behavior normal.      Musculoskeletal Exam: ***  CDAI Exam: CDAI Score: -- Patient Global: --; Provider Global: -- Swollen: --; Tender: -- Joint Exam 07/30/2024   No joint exam has been documented for this visit   There is currently no information documented on the homunculus. Go to the Rheumatology activity and complete the homunculus joint exam.  Investigation: No additional findings.  Imaging: No results found.  Recent Labs: Lab Results  Component Value Date   WBC 6.3 04/30/2024   HGB 14.4 04/30/2024   PLT 256 04/30/2024   NA 135 04/30/2024   K 5.2 04/30/2024   CL 98 04/30/2024   CO2 30 04/30/2024   GLUCOSE 110 (H) 04/30/2024   BUN 10 04/30/2024    CREATININE 0.80 04/30/2024   BILITOT 0.7 04/30/2024   ALKPHOS 81 08/03/2023   AST 23 04/30/2024   ALT 21 04/30/2024   PROT 7.0 04/30/2024   ALBUMIN 4.1 08/03/2023   CALCIUM 9.2 04/30/2024   GFRAA 112 02/25/2021   QFTBGOLD NEGATIVE 07/03/2017   QFTBGOLDPLUS NEGATIVE 10/29/2023    Speciality Comments: Prior therapy includes: Enbrel  (pancytopenia) and methotrexate (inadequate response) Orencia -07/09/18 Enbrel -1/19-10/19  Procedures:  No procedures performed Allergies: Patient has no known allergies.   Assessment / Plan:     Visit Diagnoses: Rheumatoid arthritis involving multiple sites with positive rheumatoid factor (HCC)  High risk medications (not anticoagulants) long-term use  Status post replacement of both shoulder  joints  Primary osteoarthritis of both hands  S/P total knee arthroplasty, left  S/P total knee arthroplasty, right  Primary osteoarthritis of both feet  S/P coil embolization of cerebral aneurysm  Latent syphilis  History of alcohol abuse  History of neutropenia  Orders: No orders of the defined types were placed in this encounter.  No orders of the defined types were placed in this encounter.   Face-to-face time spent with patient was *** minutes. Greater than 50% of time was spent in counseling and coordination of care.  Follow-Up Instructions: No follow-ups on file.   Waddell CHRISTELLA Craze, PA-C  Note - This record has been created using Dragon software.  Chart creation errors have been sought, but may not always  have been located. Such creation errors do not reflect on  the standard of medical care.

## 2024-07-24 ENCOUNTER — Other Ambulatory Visit: Payer: Self-pay

## 2024-07-24 NOTE — Progress Notes (Signed)
 Specialty Pharmacy Refill Coordination Note  Daniel Griffin is a 63 y.o. male contacted today regarding refills of specialty medication(s) Abatacept  (Orencia  ClickJect)   Patient requested Delivery   Delivery date: 07/28/24   Verified address: [address] [place] [facility] [place]   Medication will be filled on: 07/29/24  Spoke with patient's sister

## 2024-07-28 ENCOUNTER — Other Ambulatory Visit: Payer: Self-pay

## 2024-07-29 ENCOUNTER — Ambulatory Visit: Payer: Self-pay | Admitting: Nurse Practitioner

## 2024-07-29 NOTE — Progress Notes (Deleted)
 Subjective:    Patient ID: Daniel Griffin, male    DOB: 1961-01-06, 63 y.o.   MRN: [id number]   Chief Complaint: medical management of chronic issues     HPI:   Daniel Griffin is a 63 y.o. who identifies as a male who was assigned male at birth.   Social history: Lives with: by himself Work history: disability   Comes in today for follow up of the following chronic medical issues:  1. Moderate dementia with anxiety, unspecified dementia type [facility]) Patient is on lexapro  and seems to be doing ok. Not able t o work because he has trouble remembering things. Doe snot go far from his home I he is alone.    05/26/2024   12:32 PM 02/01/2024   10:31 AM 08/03/2023   10:26 AM  Depression screen PHQ 2/9  Decreased Interest 0 0 0  Down, Depressed, Hopeless 0 0 0  PHQ - 2 Score 0 0 0  Altered sleeping   0  Tired, decreased energy   0  Change in appetite   0  Feeling bad or failure about yourself    0  Trouble concentrating   0  Moving slowly or fidgety/restless   0  Suicidal thoughts   0  PHQ-9 Score   0   Difficult doing work/chores   Not difficult at all     Data saved with a previous flowsheet row definition     2. Rheumatoid arthritis involving multiple sites with positive rheumatoid factor (HCC) Has had multiple surgeries for joints lately. Still has daily pain. Sees rheumatology every 6 months. No change to plan of care at last visit on 01/29/24  3. Body mass index 30.0-30.9, adult No recent weight changes Wt Readings from Last 3 Encounters:  05/26/24 192 lb (87.1 kg)  04/30/24 192 lb 9.6 oz (87.4 kg)  02/01/24 188 lb (85.3 kg)   BMI Readings from Last 3 Encounters:  05/26/24 30.99 kg/m  04/30/24 31.09 kg/m  02/01/24 29.44 kg/m     4. Brain aneurysm Has some memory issues and some thought process issues but is doiing ok. Denies any headaches  5. depression    05/26/2024   12:32 PM 02/01/2024   10:31 AM 08/03/2023   10:26 AM  Depression screen PHQ  2/9  Decreased Interest 0 0 0  Down, Depressed, Hopeless 0 0 0  PHQ - 2 Score 0 0 0  Altered sleeping   0  Tired, decreased energy   0  Change in appetite   0  Feeling bad or failure about yourself    0  Trouble concentrating   0  Moving slowly or fidgety/restless   0  Suicidal thoughts   0  PHQ-9 Score   0   Difficult doing work/chores   Not difficult at all     Data saved with a previous flowsheet row definition       New complaints: None today  No Known Allergies Outpatient Encounter Medications as of 07/29/2024  Medication Sig   Abatacept  (ORENCIA  CLICKJECT) 125 MG/ML SOAJ INJECT 125 MG INTO THE SKIN ONCE A WEEK.   buPROPion  (WELLBUTRIN  XL) 150 MG 24 hr tablet Take 1 tablet (150 mg total) by mouth daily.   escitalopram  (LEXAPRO ) 10 MG tablet Take 1 tablet (10 mg total) by mouth daily.   lisinopril  (ZESTRIL ) 20 MG tablet Take 1 tablet (20 mg total) by mouth daily.   predniSONE  (DELTASONE ) 5 MG tablet Take 4 tabs po x  4 days, 3  tabs po x 4 days, 2  tabs po x 4 days, 1  tab po x 4 days (Patient not taking: Reported on 05/26/2024)   No facility-administered encounter medications on file as of 07/29/2024.    Past Surgical History:  Procedure Laterality Date   ANKLE FRACTURE SURGERY     Left   APPENDECTOMY     IR 3D INDEPENDENT WKST  09/01/2021   IR 3D INDEPENDENT WKST  12/14/2021   IR ANGIO INTRA EXTRACRAN SEL INTERNAL CAROTID BILAT MOD SED  09/01/2021   IR ANGIO INTRA EXTRACRAN SEL INTERNAL CAROTID UNI R MOD SED  12/14/2021   IR ANGIO VERTEBRAL SEL VERTEBRAL BILAT MOD SED  09/01/2021   IR ANGIOGRAM FOLLOW UP STUDY  12/14/2021   IR ANGIOGRAM FOLLOW UP STUDY  12/14/2021   IR ANGIOGRAM FOLLOW UP STUDY  12/14/2021   IR CT HEAD LTD  12/14/2021   IR TRANSCATH/EMBOLIZ  12/14/2021   IR US  GUIDE VASC ACCESS RIGHT  09/01/2021   IR US  GUIDE VASC ACCESS RIGHT  12/14/2021   MOHS SURGERY  2024   on face   RADIOLOGY WITH ANESTHESIA N/A 12/14/2021   Procedure: IR WITH ANESTHESIA  EMBOLIZATION;  Surgeon: de Macedo Rodrigues, Katyucia, MD;  Location: [facility] OR;  Service: Radiology;  Laterality: N/A;   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   REPLACEMENT TOTAL KNEE Right 02/2023   REVERSE SHOULDER ARTHROPLASTY Left 05/13/2021   Procedure: REVERSE SHOULDER ARTHROPLASTY;  Surgeon: Kay Kemps, MD;  Location: [facility] ORS;  Service: Orthopedics;  Laterality: Left;  with ISB   REVERSE SHOULDER ARTHROPLASTY Right 10/14/2021   Procedure: REVERSE SHOULDER ARTHROPLASTY;  Surgeon: Kay Kemps, MD;  Location: [facility] ORS;  Service: Orthopedics;  Laterality: Right;  with ISB    Family History  Problem Relation Age of Onset   Alzheimer's disease Father    Healthy Son    Healthy Daughter    Rheum arthritis Daughter    Healthy Daughter       Controlled substance contract: n/a     Review of Systems  Constitutional:  Negative for diaphoresis.  Eyes:  Negative for pain.  Respiratory:  Negative for shortness of breath.   Cardiovascular:  Negative for chest pain, palpitations and leg swelling.  Gastrointestinal:  Negative for abdominal pain.  Endocrine: Negative for polydipsia.  Musculoskeletal:  Positive for arthralgias.  Skin:  Negative for rash.  Neurological:  Negative for dizziness, weakness and headaches.  Hematological:  Does not bruise/bleed easily.  All other systems reviewed and are negative.      Objective:   Physical Exam Vitals and nursing note reviewed.  Constitutional:      Appearance: Normal appearance. He is well-developed.  HENT:     Head: Normocephalic.     Nose: Nose normal.     Mouth/Throat:     Mouth: Mucous membranes are moist.     Pharynx: Oropharynx is clear.  Eyes:     Pupils: Pupils are equal, round, and reactive to light.  Neck:     Thyroid : No thyroid  mass or thyromegaly.     Vascular: No carotid bruit or JVD.     Trachea: Phonation normal.  Cardiovascular:     Rate and Rhythm: Normal rate and regular rhythm.  Pulmonary:     Effort:  Pulmonary effort is normal. No respiratory distress.     Breath sounds: Normal breath sounds.  Abdominal:     General: Bowel sounds are normal.     Palpations: Abdomen is  soft.     Tenderness: There is no abdominal tenderness.  Musculoskeletal:        General: Normal range of motion.     Cervical back: Normal range of motion and neck supple.  Lymphadenopathy:     Cervical: No cervical adenopathy.  Skin:    General: Skin is warm and dry.  Neurological:     Mental Status: He is alert and oriented to person, place, and time.  Psychiatric:        Behavior: Behavior normal.        Thought Content: Thought content normal.        Judgment: Judgment normal.     There were no vitals taken for this visit.        Assessment & Plan:  Daniel Griffin comes in today with chief complaint of medical management of chronic issues    Diagnosis and orders addressed:  1. Moderate dementia with anxiety, unspecified dementia type (HCC) (Primary) Orient daily  2. Rheumatoid arthritis involving multiple sites with positive rheumatoid factor (HCC) Keep follow up with rheumatology  3. Body mass index 30.0-30.9, adult  4. Brain aneurysm Report amy headaches  5. Depression, major, single episode, severe (HCC) Stress management - escitalopram  (LEXAPRO ) 10 MG tablet; Take 1 tablet (10 mg total) by mouth daily.  Dispense: 90 tablet; Refill: 1  6. Primary hypertension Low sodium diet - lisinopril  (ZESTRIL ) 20 MG tablet; Take 1 tablet (20 mg total) by mouth daily.  Dispense: 90 tablet; Refill: 1   Labs reviewed at appointment Health Maintenance reviewed Diet and exercise encouraged  Follow up plan: 6 months   Mary-Margaret Gladis, FNP

## 2024-07-30 ENCOUNTER — Ambulatory Visit: Admitting: Physician Assistant

## 2024-07-30 DIAGNOSIS — F1011 Alcohol abuse, in remission: Secondary | ICD-10-CM

## 2024-07-30 DIAGNOSIS — M0579 Rheumatoid arthritis with rheumatoid factor of multiple sites without organ or systems involvement: Secondary | ICD-10-CM

## 2024-07-30 DIAGNOSIS — Z96652 Presence of left artificial knee joint: Secondary | ICD-10-CM

## 2024-07-30 DIAGNOSIS — Z96651 Presence of right artificial knee joint: Secondary | ICD-10-CM

## 2024-07-30 DIAGNOSIS — M19042 Primary osteoarthritis, left hand: Secondary | ICD-10-CM

## 2024-07-30 DIAGNOSIS — Z79899 Other long term (current) drug therapy: Secondary | ICD-10-CM

## 2024-07-30 DIAGNOSIS — Z96611 Presence of right artificial shoulder joint: Secondary | ICD-10-CM

## 2024-07-30 DIAGNOSIS — Z9889 Other specified postprocedural states: Secondary | ICD-10-CM

## 2024-07-30 DIAGNOSIS — Z862 Personal history of diseases of the blood and blood-forming organs and certain disorders involving the immune mechanism: Secondary | ICD-10-CM

## 2024-07-30 DIAGNOSIS — M19072 Primary osteoarthritis, left ankle and foot: Secondary | ICD-10-CM

## 2024-07-30 DIAGNOSIS — A530 Latent syphilis, unspecified as early or late: Secondary | ICD-10-CM

## 2024-07-31 ENCOUNTER — Ambulatory Visit: Attending: Physician Assistant | Admitting: Physician Assistant

## 2024-07-31 ENCOUNTER — Encounter: Payer: Self-pay | Admitting: Physician Assistant

## 2024-07-31 VITALS — BP 142/84 | HR 89 | Temp 98.2°F | Resp 16 | Ht 66.0 in | Wt 196.8 lb

## 2024-07-31 DIAGNOSIS — F1011 Alcohol abuse, in remission: Secondary | ICD-10-CM | POA: Diagnosis not present

## 2024-07-31 DIAGNOSIS — Z9889 Other specified postprocedural states: Secondary | ICD-10-CM

## 2024-07-31 DIAGNOSIS — M19041 Primary osteoarthritis, right hand: Secondary | ICD-10-CM

## 2024-07-31 DIAGNOSIS — M19042 Primary osteoarthritis, left hand: Secondary | ICD-10-CM

## 2024-07-31 DIAGNOSIS — Z79899 Other long term (current) drug therapy: Secondary | ICD-10-CM

## 2024-07-31 DIAGNOSIS — Z96612 Presence of left artificial shoulder joint: Secondary | ICD-10-CM | POA: Diagnosis not present

## 2024-07-31 DIAGNOSIS — Z96611 Presence of right artificial shoulder joint: Secondary | ICD-10-CM

## 2024-07-31 DIAGNOSIS — M19071 Primary osteoarthritis, right ankle and foot: Secondary | ICD-10-CM | POA: Diagnosis not present

## 2024-07-31 DIAGNOSIS — Z96652 Presence of left artificial knee joint: Secondary | ICD-10-CM

## 2024-07-31 DIAGNOSIS — A530 Latent syphilis, unspecified as early or late: Secondary | ICD-10-CM

## 2024-07-31 DIAGNOSIS — Z96651 Presence of right artificial knee joint: Secondary | ICD-10-CM

## 2024-07-31 DIAGNOSIS — M0579 Rheumatoid arthritis with rheumatoid factor of multiple sites without organ or systems involvement: Secondary | ICD-10-CM | POA: Diagnosis not present

## 2024-07-31 DIAGNOSIS — Z862 Personal history of diseases of the blood and blood-forming organs and certain disorders involving the immune mechanism: Secondary | ICD-10-CM

## 2024-07-31 DIAGNOSIS — M19072 Primary osteoarthritis, left ankle and foot: Secondary | ICD-10-CM

## 2024-07-31 NOTE — Progress Notes (Signed)
 Office Visit Note  Patient: Daniel Griffin             Date of Birth: [birth year]           MRN: [id number]             PCP: Gladis Mustard, FNP Referring: Gladis Mustard, FNP Visit Date: 07/31/2024 Occupation: Data Unavailable  Subjective:  Medication monitoring   History of Present Illness: Daniel Griffin is a 63 y.o. male with history of rheumatoid arthritis.  Patient remains on orencia  125 mg sq injections every 7 days.  He is tolerating Orencia  without any side effects or injection site reactions.  Patient denies any signs or symptoms of a rheumatoid arthritis flare.  He has noted some increased morning stiffness which he attributes to cooler weather temperatures.  Patient states that he recently twisted the right ankle is been elevating and icing the ankle.  He is also using cane to assist with ambulation.  He is able to bear full weight and the symptoms have been improving. He denies any new medical conditions.  He denies any recent or recurrent infections.   Activities of Daily Living:  Patient reports morning stiffness for 30-40 minutes.   Patient Denies nocturnal pain.  Difficulty dressing/grooming: Reports Difficulty climbing stairs: Reports Difficulty getting out of chair: Denies Difficulty using hands for taps, buttons, cutlery, and/or writing: Denies  Review of Systems  Constitutional:  Negative for fatigue.  HENT:  Negative for mouth sores and mouth dryness.   Eyes:  Negative for dryness.  Respiratory:  Negative for shortness of breath.   Cardiovascular:  Negative for chest pain and palpitations.  Gastrointestinal:  Negative for blood in stool, constipation and diarrhea.  Endocrine: Negative for increased urination.  Genitourinary:  Negative for involuntary urination.  Musculoskeletal:  Positive for joint pain, joint pain, joint swelling and morning stiffness. Negative for gait problem, myalgias, muscle weakness, muscle tenderness and myalgias.   Skin:  Negative for color change, rash, hair loss and sensitivity to sunlight.  Allergic/Immunologic: Negative for susceptible to infections.  Neurological:  Negative for dizziness and headaches.  Hematological:  Negative for swollen glands.  Psychiatric/Behavioral:  Negative for depressed mood and sleep disturbance. The patient is nervous/anxious.     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   Syphilis 09/21/2020   Dementia (HCC) 09/21/2020   Rheumatoid arthritis involving multiple sites with positive rheumatoid factor (HCC) 11/30/2016   High risk medications (not anticoagulants) long-term use 11/30/2016    Past Medical History:  Diagnosis Date   Arthritis    RA   Depression    Seizures (HCC) 06/21/2020   ETOH w/d related   Skin cancer of face 2024    Family History  Problem Relation Age of Onset   Alzheimer's disease Father    Healthy Son    Healthy Daughter    Rheum arthritis Daughter    Healthy Daughter    Past Surgical History:  Procedure Laterality Date   ANKLE FRACTURE SURGERY     Left   APPENDECTOMY     IR 3D INDEPENDENT WKST  09/01/2021   IR 3D INDEPENDENT WKST  12/14/2021   IR ANGIO INTRA EXTRACRAN SEL INTERNAL CAROTID BILAT MOD SED  09/01/2021   IR ANGIO INTRA  EXTRACRAN SEL INTERNAL CAROTID UNI R MOD SED  12/14/2021   IR ANGIO VERTEBRAL SEL VERTEBRAL BILAT MOD SED  09/01/2021   IR ANGIOGRAM FOLLOW UP STUDY  12/14/2021   IR ANGIOGRAM FOLLOW UP STUDY  12/14/2021   IR ANGIOGRAM FOLLOW UP STUDY  12/14/2021   IR CT HEAD LTD  12/14/2021   IR TRANSCATH/EMBOLIZ  12/14/2021   IR US  GUIDE VASC ACCESS RIGHT  09/01/2021   IR US  GUIDE VASC ACCESS RIGHT  12/14/2021   MOHS SURGERY  2024   on face   RADIOLOGY WITH ANESTHESIA  N/A 12/14/2021   Procedure: IR WITH ANESTHESIA EMBOLIZATION;  Surgeon: de Macedo Rodrigues, Katyucia, MD;  Location: [facility] OR;  Service: Radiology;  Laterality: N/A;   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   REPLACEMENT TOTAL KNEE Right 02/2023   REVERSE SHOULDER ARTHROPLASTY Left 05/13/2021   Procedure: REVERSE SHOULDER ARTHROPLASTY;  Surgeon: Kay Kemps, MD;  Location: [facility] OR[facility];  Service: Orthopedics;  Laterality: Left;  with ISB   REVERSE SHOULDER ARTHROPLASTY Right 10/14/2021   Procedure: REVERSE SHOULDER ARTHROPLASTY;  Surgeon: Kay Kemps, MD;  Location: [facility] OR[facility];  Service: Orthopedics;  Laterality: Right;  with ISB   Social History[1] Social History   Social History Narrative   Lives alone   Right Handed   Rarely drinks caffeine         Immunization History  Administered Date(s) Administered   Influenza, Seasonal, Injecte, Preservative Fre 05/07/2023   Influenza,inj,Quad PF,6+ Mos 07/04/2018, 08/03/2022   Tdap 05/25/2023     Objective: Vital Signs: BP (!) 142/84 (BP Location: Left Arm, Patient Position: Sitting, Cuff Size: Normal)   Pulse 89   Temp 98.2 F (36.8 C)   Resp 16   Ht 5' 6 (1.676 m)   Wt 196 lb 12.8 oz (89.3 kg)   BMI 31.76 kg/m    Physical Exam Vitals and nursing note reviewed.  Constitutional:      Appearance: He is well-developed.  HENT:     Head: Normocephalic and atraumatic.  Eyes:     Conjunctiva/sclera: Conjunctivae normal.     Pupils: Pupils are equal, round, and reactive to light.  Cardiovascular:     Rate and Rhythm: Normal rate and regular rhythm.     Heart sounds: Normal heart sounds.  Pulmonary:     Effort: Pulmonary effort is normal.     Breath sounds: Normal breath sounds.  Abdominal:     General: Bowel sounds are normal.     Palpations: Abdomen is soft.  Musculoskeletal:     Cervical back: Normal range of motion and neck supple.  Skin:    General: Skin is warm and dry.     Capillary Refill: Capillary refill takes less than 2  seconds.  Neurological:     Mental Status: He is alert and oriented to person, place, and time.  Psychiatric:        Behavior: Behavior normal.      Musculoskeletal Exam: C-spine, thoracic spine, lumbar spine have good range of motion.  No midline spinal tenderness.  No SI joint tenderness.  Shoulder joint replacements have limited internal rotation. elbow joints, wrist joints, MCPs, PIPs, DIPs have good range of motion with no synovitis.  PIP and DIP thickening. Complete fist formation bilaterally.  Hip joints have slightly limited range of motion with no groin pain.  Right knee replacement has limited extension. Left knee replacement has good ROM. No knee effusion noted. Tenderness and swelling of the right ankle. No evidence of Achilles tendinitis  or plantar fasciitis.   CDAI Exam: CDAI Score: -- Patient Global: --; Provider Global: -- Swollen: --; Tender: -- Joint Exam 07/31/2024   No joint exam has been documented for this visit   There is currently no information documented on the homunculus. Go to the Rheumatology activity and complete the homunculus joint exam.  Investigation: No additional findings.  Imaging: No results found.  Recent Labs: Lab Results  Component Value Date   WBC 6.3 04/30/2024   HGB 14.4 04/30/2024   PLT 256 04/30/2024   NA 135 04/30/2024   K 5.2 04/30/2024   CL 98 04/30/2024   CO2 30 04/30/2024   GLUCOSE 110 (H) 04/30/2024   BUN 10 04/30/2024   CREATININE 0.80 04/30/2024   BILITOT 0.7 04/30/2024   ALKPHOS 81 08/03/2023   AST 23 04/30/2024   ALT 21 04/30/2024   PROT 7.0 04/30/2024   ALBUMIN 4.1 08/03/2023   CALCIUM 9.2 04/30/2024   GFRAA 112 02/25/2021   QFTBGOLD NEGATIVE 07/03/2017   QFTBGOLDPLUS NEGATIVE 10/29/2023    Speciality Comments: Prior therapy includes: Enbrel  (pancytopenia) and methotrexate (inadequate response) Orencia -07/09/18 Enbrel -1/19-10/19  Procedures:  No procedures performed Allergies: Patient has no known  allergies.   Assessment / Plan:     Visit Diagnoses: Rheumatoid arthritis involving multiple sites with positive rheumatoid factor (HCC): No synovitis noted on examination today.  He has been experiencing morning stiffness lasting 30 to 45 minutes daily depending on his activity level or weather.  He has remained on Orencia  125 mg sq injections once weekly.  He is tolerating Orencia  without any side effects or injection site reactions.  He has not had any major gaps in therapy.  No medication changes will be made at this time.  He sprained his right ankle a few days ago and is able to bear full weight.  He has been icing and elevating the right ankle and has been using a cane to assist with ambulation.  His symptoms have been improving so he has declined x-rays at this time.   He was advised to notify us  if he develops any signs or symptoms of a flare.  He will follow-up in the office in 3 months or sooner if needed.  High risk medications (not anticoagulants) long-term use - Orencia  125 mg sq injections once weekly. Previous therapy: Enbrel , methotrexate.  CBC and CMP updated on 04/30/24. Orders for CBC and CMP released today.  Lipid panel updated on 01/29/24 TB gold negative on 10/29/23  Discussed the importance of holding orencia  if he develops signs or symptoms of an infection and to resume once the infection has completely cleared.  - Plan: CBC with Differential/Platelet, Comprehensive metabolic panel with GFR  Status post replacement of both shoulder joints: Stiffness with internal rotation.  Primary osteoarthritis of both hands: PIP and DIP thickening consistent with osteoarthritis of both hands.  No tenderness or synovitis noted. He experiences stiffness in the mornings, which improves with hand exercises.    S/P total knee arthroplasty, left - Performed by Dr. Melvenia.  Doing well.  No warmth or effusion noted.  S/P total knee arthroplasty, right - Performed by Dr. Melvenia.  Completed PT.  Slightly limited extension on exam.  He has a follow-up visit scheduled with Dr. Eliza later this month for routine follow up.   Primary osteoarthritis of both feet: He has good ROM of both ankle joints with no tenderness or joint swelling.    Other medical conditions are listed as follows:   S/P coil  embolization of cerebral aneurysm  Latent syphilis  History of alcohol abuse  History of neutropenia  Orders: Orders Placed This Encounter  Procedures   CBC with Differential/Platelet   Comprehensive metabolic panel with GFR   No orders of the defined types were placed in this encounter.    Follow-Up Instructions: Return in about 3 months (around 10/29/2024) for Rheumatoid arthritis.   Waddell CHRISTELLA Craze, PA-C  Note - This record has been created using Dragon software.  Chart creation errors have been sought, but may not always  have been located. Such creation errors do not reflect on  the standard of medical care.     [1]  Social History Tobacco Use   Smoking status: Never    Passive exposure: Past   Smokeless tobacco: Current    Types: Snuff, Chew  Vaping Use   Vaping status: Never Used  Substance Use Topics   Alcohol use: Yes    Comment: occ   Drug use: Yes    Types: Marijuana    Comment: occ

## 2024-08-01 ENCOUNTER — Ambulatory Visit: Payer: Self-pay | Admitting: Physician Assistant

## 2024-08-01 LAB — COMPREHENSIVE METABOLIC PANEL WITH GFR
AG Ratio: 1.6 (calc) (ref 1.0–2.5)
ALT: 28 U/L (ref 9–46)
AST: 37 U/L — ABNORMAL HIGH (ref 10–35)
Albumin: 4.6 g/dL (ref 3.6–5.1)
Alkaline phosphatase (APISO): 72 U/L (ref 35–144)
BUN: 8 mg/dL (ref 7–25)
CO2: 27 mmol/L (ref 20–32)
Calcium: 9.5 mg/dL (ref 8.6–10.3)
Chloride: 96 mmol/L — ABNORMAL LOW (ref 98–110)
Creat: 0.81 mg/dL (ref 0.70–1.35)
Globulin: 2.9 g/dL (ref 1.9–3.7)
Glucose, Bld: 91 mg/dL (ref 65–99)
Potassium: 5 mmol/L (ref 3.5–5.3)
Sodium: 135 mmol/L (ref 135–146)
Total Bilirubin: 0.7 mg/dL (ref 0.2–1.2)
Total Protein: 7.5 g/dL (ref 6.1–8.1)
eGFR: 98 mL/min/1.73m2 (ref >=60)

## 2024-08-01 LAB — CBC WITH DIFFERENTIAL/PLATELET
Absolute Lymphocytes: 1299 {cells}/uL (ref 850–3900)
Absolute Monocytes: 873 {cells}/uL (ref 200–950)
Basophils Absolute: 50 {cells}/uL (ref 0–200)
Basophils Relative: 0.7 %
Eosinophils Absolute: 92 {cells}/uL (ref 15–500)
Eosinophils Relative: 1.3 %
HCT: 41 % (ref 39.4–51.1)
Hemoglobin: 13.9 g/dL (ref 13.2–17.1)
MCH: 33.6 pg — ABNORMAL HIGH (ref 27.0–33.0)
MCHC: 33.9 g/dL (ref 31.6–35.4)
MCV: 99 fL (ref 81.4–101.7)
MPV: 8.9 fL (ref 7.5–12.5)
Monocytes Relative: 12.3 %
Neutro Abs: 4785 {cells}/uL (ref 1500–7800)
Neutrophils Relative %: 67.4 %
Platelets: 337 Thousand/uL (ref 140–400)
RBC: 4.14 Million/uL — ABNORMAL LOW (ref 4.20–5.80)
RDW: 12.2 % (ref 11.0–15.0)
Total Lymphocyte: 18.3 %
WBC: 7.1 Thousand/uL (ref 3.8–10.8)

## 2024-08-01 NOTE — Progress Notes (Signed)
 RBC count is borderline low. Rest of CBC stable.   AST is borderline elevated-37-any recent tylenol  or alcohol use? Rest of CMP WNL

## 2024-08-01 NOTE — Progress Notes (Signed)
 Recommend limiting alcohol use.

## 2024-08-07 ENCOUNTER — Other Ambulatory Visit (HOSPITAL_COMMUNITY): Payer: Self-pay

## 2024-08-07 ENCOUNTER — Ambulatory Visit: Admitting: Nurse Practitioner

## 2024-08-07 ENCOUNTER — Telehealth: Payer: Self-pay | Admitting: Pharmacist

## 2024-08-07 NOTE — Telephone Encounter (Signed)
 Received letter from [facility] stating Orencia  will not be covered in 2026.  Submitted a Prior Authorization renewal request to [facility] for ORENCIA  SQ via CoverMyMeds. Will update once we receive a response.  Key: [id number]

## 2024-08-08 NOTE — Telephone Encounter (Signed)
 Received notification from [facility] regarding a prior authorization for ORENCIA  SQ. Authorization has been APPROVED from 08/07/2024 to 08/20/2025. Approval letter sent to scan center.  Authorization # [id number]  Sherry Pennant, PharmD, MPH, BCPS, CPP Clinical Pharmacist

## 2024-08-12 ENCOUNTER — Encounter: Payer: Self-pay | Admitting: Nurse Practitioner

## 2024-08-12 ENCOUNTER — Ambulatory Visit: Admitting: Nurse Practitioner

## 2024-08-12 VITALS — BP 153/90 | HR 88 | Temp 97.2°F | Ht 66.0 in | Wt 196.0 lb

## 2024-08-12 DIAGNOSIS — I671 Cerebral aneurysm, nonruptured: Secondary | ICD-10-CM | POA: Diagnosis not present

## 2024-08-12 DIAGNOSIS — F1020 Alcohol dependence, uncomplicated: Secondary | ICD-10-CM | POA: Diagnosis not present

## 2024-08-12 DIAGNOSIS — Z125 Encounter for screening for malignant neoplasm of prostate: Secondary | ICD-10-CM | POA: Diagnosis not present

## 2024-08-12 DIAGNOSIS — Z6830 Body mass index (BMI) 30.0-30.9, adult: Secondary | ICD-10-CM | POA: Diagnosis not present

## 2024-08-12 DIAGNOSIS — M0579 Rheumatoid arthritis with rheumatoid factor of multiple sites without organ or systems involvement: Secondary | ICD-10-CM

## 2024-08-12 DIAGNOSIS — Z23 Encounter for immunization: Secondary | ICD-10-CM | POA: Diagnosis not present

## 2024-08-12 DIAGNOSIS — F322 Major depressive disorder, single episode, severe without psychotic features: Secondary | ICD-10-CM | POA: Diagnosis not present

## 2024-08-12 DIAGNOSIS — I10 Essential (primary) hypertension: Secondary | ICD-10-CM

## 2024-08-12 DIAGNOSIS — F03B4 Unspecified dementia, moderate, with anxiety: Secondary | ICD-10-CM

## 2024-08-12 LAB — LIPID PANEL

## 2024-08-12 MED ORDER — LISINOPRIL 40 MG PO TABS: 40.0000 mg | ORAL_TABLET | Freq: Every day | ORAL | 3 refills | Status: DC

## 2024-08-12 MED ORDER — ESCITALOPRAM OXALATE 10 MG PO TABS: 10.0000 mg | ORAL_TABLET | Freq: Every day | ORAL | 1 refills | Status: AC

## 2024-08-12 MED ORDER — LISINOPRIL 40 MG PO TABS: 40.0000 mg | ORAL_TABLET | Freq: Every day | ORAL | 1 refills | Status: AC

## 2024-08-12 MED ORDER — BUPROPION HCL ER (XL) 150 MG PO TB24: 150.0000 mg | ORAL_TABLET | Freq: Every day | ORAL | 1 refills | Status: AC

## 2024-08-12 NOTE — Progress Notes (Addendum)
 "  Subjective:    Patient ID: Daniel Griffin, male    DOB: 1960-09-24, 63 y.o.   MRN: [id number]   Chief Complaint: medical management of chronic issues     HPI:   Daniel Griffin is a 63 y.o. who identifies as a male who was assigned male at birth.   Social history: Lives with: by himself Work history: disability   Comes in today for follow up of the following chronic medical issues:  1. Moderate dementia with anxiety, unspecified dementia type [facility]) Patient is on lexapro  and seems to be doing ok. Not able t o work because he has trouble remembering things. Does not go far from his home I he is alone.    08/12/2024    2:28 PM 05/26/2024   12:32 PM 02/01/2024   10:31 AM  Depression screen PHQ 2/9  Decreased Interest 0 0 0  Down, Depressed, Hopeless 0 0 0  PHQ - 2 Score 0 0 0  Altered sleeping 0    Tired, decreased energy 0    Change in appetite 0    Feeling bad or failure about yourself  0    Trouble concentrating 0    Moving slowly or fidgety/restless 0    Suicidal thoughts 0    PHQ-9 Score 0    Difficult doing work/chores Not difficult at all         2. Rheumatoid arthritis involving multiple sites with positive rheumatoid factor (HCC) Has had multiple surgeries for joints lately. Still has daily pain. Sees rheumatology every 6 months. No change to plan of care at last visit on 01/29/24  3. Body mass index 30.0-30.9, adult No recent weight changes Wt Readings from Last 3 Encounters:  07/31/24 196 lb 12.8 oz (89.3 kg)  05/26/24 192 lb (87.1 kg)  04/30/24 192 lb 9.6 oz (87.4 kg)   BMI Readings from Last 3 Encounters:  07/31/24 31.76 kg/m  05/26/24 30.99 kg/m  04/30/24 31.09 kg/m     4. Brain aneurysm Has some memory issues and some thought process issues but is doiing ok. Denies any headaches  5. depression    08/12/2024    2:28 PM 05/26/2024   12:32 PM 02/01/2024   10:31 AM  Depression screen PHQ 2/9  Decreased Interest 0 0 0  Down,  Depressed, Hopeless 0 0 0  PHQ - 2 Score 0 0 0  Altered sleeping 0    Tired, decreased energy 0    Change in appetite 0    Feeling bad or failure about yourself  0    Trouble concentrating 0    Moving slowly or fidgety/restless 0    Suicidal thoughts 0    PHQ-9 Score 0    Difficult doing work/chores Not difficult at all       6. hypertension No c/o chest pain, sob or headache. Is on lisinopril  and very seldom checks his blood pressure at home. BP Readings from Last 3 Encounters:  08/12/24 (!) 153/90  07/31/24 (!) 142/84  05/26/24 (!) 152/84     7. alcoholism Daughter states he is drinking again and he admits to that. Not sure how much he drinks daily. Mainly beer.  New complaints: None today  No Known Allergies Outpatient Encounter Medications as of 08/12/2024  Medication Sig   Abatacept  (ORENCIA  CLICKJECT) 125 MG/ML SOAJ INJECT 125 MG INTO THE SKIN ONCE A WEEK.   buPROPion  (WELLBUTRIN  XL) 150 MG 24 hr tablet Take 1 tablet (150 mg total) by mouth  daily.   escitalopram  (LEXAPRO ) 10 MG tablet Take 1 tablet (10 mg total) by mouth daily.   lisinopril  (ZESTRIL ) 20 MG tablet Take 1 tablet (20 mg total) by mouth daily.   predniSONE  (DELTASONE ) 5 MG tablet Take 4 tabs po x 4 days, 3  tabs po x 4 days, 2  tabs po x 4 days, 1  tab po x 4 days (Patient not taking: Reported on 07/31/2024)   No facility-administered encounter medications on file as of 08/12/2024.    Past Surgical History:  Procedure Laterality Date   ANKLE FRACTURE SURGERY     Left   APPENDECTOMY     IR 3D INDEPENDENT [facility]ST  09/01/2021   IR 3D INDEPENDENT [facility]ST  12/14/2021   IR ANGIO INTRA EXTRACRAN SEL INTERNAL CAROTID BILAT MOD SED  09/01/2021   IR ANGIO INTRA EXTRACRAN SEL INTERNAL CAROTID UNI R MOD SED  12/14/2021   IR ANGIO VERTEBRAL SEL VERTEBRAL BILAT MOD SED  09/01/2021   IR ANGIOGRAM FOLLOW UP STUDY  12/14/2021   IR ANGIOGRAM FOLLOW UP STUDY  12/14/2021   IR ANGIOGRAM FOLLOW UP STUDY  12/14/2021   IR  CT HEAD LTD  12/14/2021   IR TRANSCATH/EMBOLIZ  12/14/2021   IR US  GUIDE VASC ACCESS RIGHT  09/01/2021   IR US  GUIDE VASC ACCESS RIGHT  12/14/2021   MOHS SURGERY  2024   on face   RADIOLOGY WITH ANESTHESIA N/A 12/14/2021   Procedure: IR WITH ANESTHESIA EMBOLIZATION;  Surgeon: de Macedo Rodrigues, Katyucia, MD;  Location: [facility] OR;  Service: Radiology;  Laterality: N/A;   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   REPLACEMENT TOTAL KNEE Right 02/2023   REVERSE SHOULDER ARTHROPLASTY Left 05/13/2021   Procedure: REVERSE SHOULDER ARTHROPLASTY;  Surgeon: Kay Kemps, MD;  Location: [facility] ORS;  Service: Orthopedics;  Laterality: Left;  with ISB   REVERSE SHOULDER ARTHROPLASTY Right 10/14/2021   Procedure: REVERSE SHOULDER ARTHROPLASTY;  Surgeon: Kay Kemps, MD;  Location: [facility] ORS;  Service: Orthopedics;  Laterality: Right;  with ISB    Family History  Problem Relation Age of Onset   Alzheimer's disease Father    Healthy Son    Healthy Daughter    Rheum arthritis Daughter    Healthy Daughter       Controlled substance contract: n/a     Review of Systems  Constitutional:  Negative for diaphoresis.  Eyes:  Negative for pain.  Respiratory:  Negative for shortness of breath.   Cardiovascular:  Negative for chest pain, palpitations and leg swelling.  Gastrointestinal:  Negative for abdominal pain.  Endocrine: Negative for polydipsia.  Musculoskeletal:  Positive for arthralgias.  Skin:  Negative for rash.  Neurological:  Negative for dizziness, weakness and headaches.  Hematological:  Does not bruise/bleed easily.  All other systems reviewed and are negative.      Objective:   Physical Exam Vitals and nursing note reviewed.  Constitutional:      Appearance: Normal appearance. He is well-developed.  HENT:     Head: Normocephalic.     Nose: Nose normal.     Mouth/Throat:     Mouth: Mucous membranes are moist.     Pharynx: Oropharynx is clear.  Eyes:     Pupils: Pupils are equal,  round, and reactive to light.  Neck:     Thyroid : No thyroid  mass or thyromegaly.     Vascular: No carotid bruit or JVD.     Trachea: Phonation normal.  Cardiovascular:     Rate and Rhythm: Normal  rate and regular rhythm.  Pulmonary:     Effort: Pulmonary effort is normal. No respiratory distress.     Breath sounds: Normal breath sounds.  Abdominal:     General: Bowel sounds are normal.     Palpations: Abdomen is soft.     Tenderness: There is no abdominal tenderness.  Musculoskeletal:        General: Normal range of motion.     Cervical back: Normal range of motion and neck supple.  Lymphadenopathy:     Cervical: No cervical adenopathy.  Skin:    General: Skin is warm and dry.  Neurological:     Mental Status: He is alert and oriented to person, place, and time.  Psychiatric:        Behavior: Behavior normal.        Thought Content: Thought content normal.        Judgment: Judgment normal.     BP (!) 153/90   Pulse 88   Temp (!) 97.2 F (36.2 C) (Temporal)   Ht 5' 6 (1.676 m)   Wt 196 lb (88.9 kg)   SpO2 92%   BMI 31.64 kg/m        Assessment & Plan:  Daniel Griffin comes in today with chief complaint of Medical Management of Chronic Issues   Diagnosis and orders addressed:  1. Moderate dementia with anxiety, unspecified dementia type (HCC) (Primary) Brain exercises- read- puzzle games etc.  2. Rheumatoid arthritis involving multiple sites with positive rheumatoid factor (HCC) Keep follow up with rheumatologist  3. Body mass index 30.0-30.9, adult Discussed diet and exercise for person with BMI >25 Will recheck weight in 3-6 months   4. Depression, major, single episode, severe (HCC) Stress management - escitalopram  (LEXAPRO ) 10 MG tablet; Take 1 tablet (10 mg total) by mouth daily.  Dispense: 90 tablet; Refill: 1 - buPROPion  (WELLBUTRIN  XL) 150 MG 24 hr tablet; Take 1 tablet (150 mg total) by mouth daily.  Dispense: 90 tablet; Refill: 1  5. Brain  aneurysm Report any headaches , blurred vision or uncontrollable vomiting  6. Primary hypertension Dash diet Increased lisinorpil form 20mg  to 40 mg for blood pressure control. - lisinopril  (ZESTRIL ) 40 MG tablet; Take 1 tablet (40 mg total) by mouth daily.  Dispense: 90 tablet; Refill: 1 - CBC with Differential/Platelet - CMP14+EGFR - Lipid panel  7. Alcoholism, chronic (HCC) Encouraged to stop drinking or at least cut back  8. Prostate cancer screening Labs pending - PSA, total and free   Labs pending Health Maintenance reviewed Diet and exercise encouraged  Follow up plan: 6 months   Mary-Margaret Gladis, FNP   "

## 2024-08-12 NOTE — Patient Instructions (Signed)

## 2024-08-13 LAB — PSA, TOTAL AND FREE
PSA, Free Pct: 21.4 %
PSA, Free: 0.3 ng/mL
Prostate Specific Ag, Serum: 1.4 ng/mL (ref 0.0–4.0)

## 2024-08-13 LAB — CBC WITH DIFFERENTIAL/PLATELET
Basophils Absolute: 0 x10E3/uL (ref 0.0–0.2)
Basos: 0 %
EOS (ABSOLUTE): 0.1 x10E3/uL (ref 0.0–0.4)
Eos: 1 %
Hematocrit: 39.4 % (ref 37.5–51.0)
Hemoglobin: 13.3 g/dL (ref 13.0–17.7)
Immature Grans (Abs): 0 x10E3/uL (ref 0.0–0.1)
Immature Granulocytes: 0 %
Lymphocytes Absolute: 1.5 x10E3/uL (ref 0.7–3.1)
Lymphs: 29 %
MCH: 33.8 pg — ABNORMAL HIGH (ref 26.6–33.0)
MCHC: 33.8 g/dL (ref 31.5–35.7)
MCV: 100 fL — ABNORMAL HIGH (ref 79–97)
Monocytes Absolute: 0.7 x10E3/uL (ref 0.1–0.9)
Monocytes: 14 %
Neutrophils Absolute: 2.9 x10E3/uL (ref 1.4–7.0)
Neutrophils: 56 %
Platelets: 231 x10E3/uL (ref 150–450)
RBC: 3.94 x10E6/uL — ABNORMAL LOW (ref 4.14–5.80)
RDW: 11.9 % (ref 11.6–15.4)
WBC: 5.2 x10E3/uL (ref 3.4–10.8)

## 2024-08-13 LAB — CMP14+EGFR
ALT: 33 IU/L (ref 0–44)
AST: 41 IU/L — ABNORMAL HIGH (ref 0–40)
Albumin: 4.4 g/dL (ref 3.9–4.9)
Alkaline Phosphatase: 81 IU/L (ref 47–123)
BUN/Creatinine Ratio: 11 (ref 10–24)
BUN: 8 mg/dL (ref 8–27)
Bilirubin Total: 0.9 mg/dL (ref 0.0–1.2)
CO2: 21 mmol/L (ref 20–29)
Calcium: 9.1 mg/dL (ref 8.6–10.2)
Chloride: 95 mmol/L — ABNORMAL LOW (ref 96–106)
Creatinine, Ser: 0.72 mg/dL — ABNORMAL LOW (ref 0.76–1.27)
Globulin, Total: 3 g/dL (ref 1.5–4.5)
Glucose: 96 mg/dL (ref 70–99)
Potassium: 5.1 mmol/L (ref 3.5–5.2)
Sodium: 132 mmol/L — ABNORMAL LOW (ref 134–144)
Total Protein: 7.4 g/dL (ref 6.0–8.5)
eGFR: 102 mL/min/1.73

## 2024-08-13 LAB — LIPID PANEL
Chol/HDL Ratio: 1.7 ratio (ref 0.0–5.0)
Cholesterol, Total: 141 mg/dL (ref 100–199)
HDL: 85 mg/dL
LDL Chol Calc (NIH): 46 mg/dL (ref 0–99)
Triglycerides: 40 mg/dL (ref 0–149)
VLDL Cholesterol Cal: 10 mg/dL (ref 5–40)

## 2024-08-15 ENCOUNTER — Ambulatory Visit: Payer: Self-pay | Admitting: Nurse Practitioner

## 2024-08-18 ENCOUNTER — Other Ambulatory Visit: Payer: Self-pay | Admitting: Physician Assistant

## 2024-08-18 ENCOUNTER — Other Ambulatory Visit: Payer: Self-pay

## 2024-08-18 ENCOUNTER — Other Ambulatory Visit (HOSPITAL_COMMUNITY): Payer: Self-pay

## 2024-08-18 DIAGNOSIS — M0579 Rheumatoid arthritis with rheumatoid factor of multiple sites without organ or systems involvement: Secondary | ICD-10-CM

## 2024-08-18 DIAGNOSIS — Z79899 Other long term (current) drug therapy: Secondary | ICD-10-CM

## 2024-08-18 MED ORDER — ORENCIA CLICKJECT 125 MG/ML ~~LOC~~ SOAJ
125.0000 mg | SUBCUTANEOUS | 0 refills | Status: AC
  Filled 2024-08-18: qty 12, 84d supply, fill #0
  Filled 2024-08-22 – 2024-09-05 (×2): qty 4, 28d supply, fill #0

## 2024-08-18 NOTE — Telephone Encounter (Signed)
 Last Fill: 05/26/2024  Labs: 08/12/2024 creatinine 0.72, sodium 132, chloride 95, AST 41, RBC 3.94, MCV 100, MCH 33.8  TB Gold: 10/29/2023 negative    Next Visit: 10/29/2024  Last Visit: 07/31/2024  IK:Myzlfjunpi arthritis involving multiple sites with positive rheumatoid factor   Current Dose per office note on 07/31/2024: Orencia  125 mg sq injections once weekly.   Okay to refill Orencia ?

## 2024-08-22 ENCOUNTER — Other Ambulatory Visit: Payer: Self-pay

## 2024-08-25 ENCOUNTER — Other Ambulatory Visit: Payer: Self-pay

## 2024-08-26 ENCOUNTER — Other Ambulatory Visit: Payer: Self-pay

## 2024-08-26 ENCOUNTER — Other Ambulatory Visit (HOSPITAL_COMMUNITY): Payer: Self-pay

## 2024-08-27 ENCOUNTER — Other Ambulatory Visit: Payer: Self-pay

## 2024-08-29 ENCOUNTER — Other Ambulatory Visit: Payer: Self-pay

## 2024-09-02 ENCOUNTER — Other Ambulatory Visit: Payer: Self-pay

## 2024-09-03 ENCOUNTER — Other Ambulatory Visit (HOSPITAL_COMMUNITY): Payer: Self-pay

## 2024-09-05 ENCOUNTER — Other Ambulatory Visit: Payer: Self-pay

## 2024-09-08 ENCOUNTER — Other Ambulatory Visit: Payer: Self-pay

## 2024-09-12 ENCOUNTER — Other Ambulatory Visit: Payer: Self-pay

## 2024-09-15 ENCOUNTER — Telehealth: Payer: Self-pay

## 2024-09-15 ENCOUNTER — Other Ambulatory Visit (HOSPITAL_COMMUNITY): Payer: Self-pay

## 2024-09-15 ENCOUNTER — Other Ambulatory Visit: Payer: Self-pay

## 2024-09-15 NOTE — Telephone Encounter (Addendum)
 Received notification from [facility] that pt's grant has expired. Test claim reveals that his copay is currently $2,100, furthermore it appears that a new PA is required despite one having just been obtained on 08/07/24 that was supposedly good until 08/20/25.     Will first reach out to pt to see how he would like to proceed before attempting to obtain a new PA.

## 2024-09-17 ENCOUNTER — Other Ambulatory Visit: Payer: Self-pay

## 2024-09-23 ENCOUNTER — Other Ambulatory Visit: Payer: Self-pay

## 2024-09-23 ENCOUNTER — Other Ambulatory Visit (HOSPITAL_COMMUNITY): Payer: Self-pay

## 2024-09-25 ENCOUNTER — Other Ambulatory Visit (HOSPITAL_COMMUNITY): Payer: Self-pay

## 2024-10-29 ENCOUNTER — Ambulatory Visit: Admitting: Physician Assistant

## 2025-02-06 ENCOUNTER — Ambulatory Visit: Admitting: Nurse Practitioner

## 2025-05-27 ENCOUNTER — Ambulatory Visit: Payer: Self-pay
